# Patient Record
Sex: Female | Born: 1935 | Race: White | Hispanic: No | Marital: Single | Smoking: Former smoker
Health system: Southern US, Community
[De-identification: ages and names within clinical notes are randomized; demographics above are authoritative.]

## PROBLEM LIST (undated history)

## (undated) DIAGNOSIS — M159 Polyosteoarthritis, unspecified: Secondary | ICD-10-CM

## (undated) DIAGNOSIS — J45909 Unspecified asthma, uncomplicated: Secondary | ICD-10-CM

## (undated) DIAGNOSIS — J189 Pneumonia, unspecified organism: Secondary | ICD-10-CM

## (undated) DIAGNOSIS — IMO0001 Reserved for inherently not codable concepts without codable children: Secondary | ICD-10-CM

## (undated) DIAGNOSIS — N318 Other neuromuscular dysfunction of bladder: Secondary | ICD-10-CM

## (undated) DIAGNOSIS — R5381 Other malaise: Secondary | ICD-10-CM

## (undated) DIAGNOSIS — R5383 Other fatigue: Secondary | ICD-10-CM

## (undated) DIAGNOSIS — M545 Low back pain: Secondary | ICD-10-CM

## (undated) DIAGNOSIS — R091 Pleurisy: Secondary | ICD-10-CM

## (undated) DIAGNOSIS — I10 Essential (primary) hypertension: Secondary | ICD-10-CM

## (undated) DIAGNOSIS — E7800 Pure hypercholesterolemia, unspecified: Secondary | ICD-10-CM

## (undated) DIAGNOSIS — E785 Hyperlipidemia, unspecified: Secondary | ICD-10-CM

## (undated) DIAGNOSIS — R197 Diarrhea, unspecified: Secondary | ICD-10-CM

## (undated) DIAGNOSIS — E538 Deficiency of other specified B group vitamins: Secondary | ICD-10-CM

## (undated) DIAGNOSIS — M1990 Unspecified osteoarthritis, unspecified site: Secondary | ICD-10-CM

## (undated) DIAGNOSIS — K623 Rectal prolapse: Secondary | ICD-10-CM

## (undated) DIAGNOSIS — R252 Cramp and spasm: Secondary | ICD-10-CM

## (undated) DIAGNOSIS — E119 Type 2 diabetes mellitus without complications: Secondary | ICD-10-CM

## (undated) HISTORY — DX: Polyosteoarthritis, unspecified: M15.9

## (undated) HISTORY — DX: Type 2 diabetes mellitus without complications: E11.9

## (undated) HISTORY — DX: Other malaise: R53.81

## (undated) HISTORY — DX: Pneumonia, unspecified organism: J18.9

## (undated) HISTORY — DX: Other neuromuscular dysfunction of bladder: N31.8

## (undated) HISTORY — PX: BLADDER SURGERY: SHX569

## (undated) HISTORY — DX: Rectal prolapse: K62.3

## (undated) HISTORY — DX: Hyperlipidemia, unspecified: E78.5

## (undated) HISTORY — DX: Cramp and spasm: R25.2

## (undated) HISTORY — DX: Reserved for inherently not codable concepts without codable children: IMO0001

## (undated) HISTORY — DX: Diarrhea, unspecified: R19.7

## (undated) HISTORY — PX: ABDOMINAL HYSTERECTOMY: SHX81

## (undated) HISTORY — DX: Pure hypercholesterolemia, unspecified: E78.00

## (undated) HISTORY — DX: Deficiency of other specified B group vitamins: E53.8

## (undated) HISTORY — DX: Low back pain: M54.5

## (undated) HISTORY — DX: Unspecified osteoarthritis, unspecified site: M19.90

## (undated) HISTORY — DX: Essential (primary) hypertension: I10

## (undated) HISTORY — PX: APPENDECTOMY: SHX54

## (undated) HISTORY — DX: Other fatigue: R53.83

## (undated) HISTORY — PX: CHOLECYSTECTOMY: SHX55

## (undated) HISTORY — DX: Pleurisy: R09.1

---
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
## 1997-08-12 ENCOUNTER — Ambulatory Visit (HOSPITAL_COMMUNITY): Admission: RE | Admit: 1997-08-12 | Discharge: 1997-08-12 | Payer: Self-pay | Admitting: *Deleted

## 1998-08-14 ENCOUNTER — Other Ambulatory Visit: Admission: RE | Admit: 1998-08-14 | Discharge: 1998-08-14 | Payer: Self-pay | Admitting: *Deleted

## 1998-08-14 ENCOUNTER — Ambulatory Visit (HOSPITAL_COMMUNITY): Admission: RE | Admit: 1998-08-14 | Discharge: 1998-08-14 | Payer: Self-pay | Admitting: *Deleted

## 1998-08-14 ENCOUNTER — Encounter: Payer: Self-pay | Admitting: *Deleted

## 1999-09-16 ENCOUNTER — Encounter: Payer: Self-pay | Admitting: *Deleted

## 1999-09-16 ENCOUNTER — Other Ambulatory Visit: Admission: RE | Admit: 1999-09-16 | Discharge: 1999-09-16 | Payer: Self-pay | Admitting: *Deleted

## 1999-09-16 ENCOUNTER — Ambulatory Visit (HOSPITAL_COMMUNITY): Admission: RE | Admit: 1999-09-16 | Discharge: 1999-09-16 | Payer: Self-pay | Admitting: *Deleted

## 2000-09-19 ENCOUNTER — Ambulatory Visit (HOSPITAL_COMMUNITY): Admission: RE | Admit: 2000-09-19 | Discharge: 2000-09-19 | Payer: Self-pay | Admitting: *Deleted

## 2000-11-10 ENCOUNTER — Ambulatory Visit (HOSPITAL_COMMUNITY): Admission: RE | Admit: 2000-11-10 | Discharge: 2000-11-10 | Payer: Self-pay | Admitting: *Deleted

## 2001-09-04 ENCOUNTER — Encounter: Payer: Self-pay | Admitting: Gastroenterology

## 2001-09-20 ENCOUNTER — Ambulatory Visit (HOSPITAL_COMMUNITY): Admission: RE | Admit: 2001-09-20 | Discharge: 2001-09-20 | Payer: Self-pay | Admitting: *Deleted

## 2002-04-24 ENCOUNTER — Encounter: Admission: RE | Admit: 2002-04-24 | Discharge: 2002-04-24 | Payer: Self-pay | Admitting: *Deleted

## 2002-08-21 ENCOUNTER — Emergency Department (HOSPITAL_COMMUNITY): Admission: EM | Admit: 2002-08-21 | Discharge: 2002-08-21 | Payer: Self-pay | Admitting: Emergency Medicine

## 2002-08-21 ENCOUNTER — Encounter: Payer: Self-pay | Admitting: Emergency Medicine

## 2002-09-23 ENCOUNTER — Ambulatory Visit (HOSPITAL_COMMUNITY): Admission: RE | Admit: 2002-09-23 | Discharge: 2002-09-23 | Payer: Self-pay | Admitting: *Deleted

## 2003-09-24 ENCOUNTER — Ambulatory Visit (HOSPITAL_COMMUNITY): Admission: RE | Admit: 2003-09-24 | Discharge: 2003-09-24 | Payer: Self-pay | Admitting: Internal Medicine

## 2004-04-21 ENCOUNTER — Ambulatory Visit: Payer: Self-pay | Admitting: Internal Medicine

## 2004-05-26 ENCOUNTER — Ambulatory Visit: Payer: Self-pay | Admitting: Internal Medicine

## 2004-06-11 ENCOUNTER — Ambulatory Visit: Payer: Self-pay | Admitting: Internal Medicine

## 2004-07-14 ENCOUNTER — Ambulatory Visit: Payer: Self-pay | Admitting: Internal Medicine

## 2004-09-24 ENCOUNTER — Ambulatory Visit (HOSPITAL_COMMUNITY): Admission: RE | Admit: 2004-09-24 | Discharge: 2004-09-24 | Payer: Self-pay | Admitting: Internal Medicine

## 2004-10-14 ENCOUNTER — Ambulatory Visit: Payer: Self-pay | Admitting: Internal Medicine

## 2005-01-13 ENCOUNTER — Ambulatory Visit: Payer: Self-pay | Admitting: Internal Medicine

## 2005-05-09 ENCOUNTER — Ambulatory Visit: Payer: Self-pay | Admitting: Internal Medicine

## 2005-05-16 ENCOUNTER — Ambulatory Visit: Payer: Self-pay | Admitting: Internal Medicine

## 2005-09-13 ENCOUNTER — Ambulatory Visit: Payer: Self-pay | Admitting: Internal Medicine

## 2005-09-27 ENCOUNTER — Ambulatory Visit (HOSPITAL_COMMUNITY): Admission: RE | Admit: 2005-09-27 | Discharge: 2005-09-27 | Payer: Self-pay | Admitting: Internal Medicine

## 2005-11-15 ENCOUNTER — Ambulatory Visit: Payer: Self-pay | Admitting: Internal Medicine

## 2005-12-26 ENCOUNTER — Ambulatory Visit: Payer: Self-pay | Admitting: Internal Medicine

## 2006-04-04 ENCOUNTER — Ambulatory Visit: Payer: Self-pay | Admitting: Internal Medicine

## 2006-05-28 ENCOUNTER — Emergency Department (HOSPITAL_COMMUNITY): Admission: EM | Admit: 2006-05-28 | Discharge: 2006-05-28 | Payer: Self-pay | Admitting: Emergency Medicine

## 2006-05-31 ENCOUNTER — Ambulatory Visit: Payer: Self-pay | Admitting: Internal Medicine

## 2006-08-08 ENCOUNTER — Ambulatory Visit: Payer: Self-pay | Admitting: Internal Medicine

## 2006-09-29 ENCOUNTER — Ambulatory Visit (HOSPITAL_COMMUNITY): Admission: RE | Admit: 2006-09-29 | Discharge: 2006-09-29 | Payer: Self-pay | Admitting: Internal Medicine

## 2006-09-29 ENCOUNTER — Encounter: Payer: Self-pay | Admitting: Internal Medicine

## 2006-11-06 ENCOUNTER — Ambulatory Visit: Payer: Self-pay | Admitting: Internal Medicine

## 2006-11-07 LAB — CONVERTED CEMR LAB
Alkaline Phosphatase: 39 units/L (ref 39–117)
BUN: 20 mg/dL (ref 6–23)
Basophils Relative: 0.8 % (ref 0.0–1.0)
Bilirubin, Direct: 0.1 mg/dL (ref 0.0–0.3)
CO2: 32 meq/L (ref 19–32)
Creatinine, Ser: 1.1 mg/dL (ref 0.4–1.2)
Direct LDL: 97.6 mg/dL
Eosinophils Relative: 3 % (ref 0.0–5.0)
GFR calc Af Amer: 62 mL/min
Glucose, Bld: 118 mg/dL — ABNORMAL HIGH (ref 70–99)
HCT: 36.5 % (ref 36.0–46.0)
Hemoglobin: 12.6 g/dL (ref 12.0–15.0)
Lymphocytes Relative: 38.4 % (ref 12.0–46.0)
Monocytes Absolute: 0.4 10*3/uL (ref 0.2–0.7)
Neutro Abs: 3.5 10*3/uL (ref 1.4–7.7)
Neutrophils Relative %: 51.6 % (ref 43.0–77.0)
Potassium: 3.9 meq/L (ref 3.5–5.1)
RDW: 13.1 % (ref 11.5–14.6)
Sodium: 142 meq/L (ref 135–145)
Total Bilirubin: 0.7 mg/dL (ref 0.3–1.2)
Total Protein: 7.3 g/dL (ref 6.0–8.3)

## 2007-02-01 ENCOUNTER — Encounter (INDEPENDENT_AMBULATORY_CARE_PROVIDER_SITE_OTHER): Payer: Self-pay

## 2007-02-01 ENCOUNTER — Ambulatory Visit: Payer: Self-pay | Admitting: Internal Medicine

## 2007-02-01 DIAGNOSIS — E7800 Pure hypercholesterolemia, unspecified: Secondary | ICD-10-CM | POA: Insufficient documentation

## 2007-02-01 DIAGNOSIS — I10 Essential (primary) hypertension: Secondary | ICD-10-CM | POA: Insufficient documentation

## 2007-02-01 DIAGNOSIS — E119 Type 2 diabetes mellitus without complications: Secondary | ICD-10-CM

## 2007-02-01 DIAGNOSIS — M159 Polyosteoarthritis, unspecified: Secondary | ICD-10-CM

## 2007-02-01 DIAGNOSIS — M549 Dorsalgia, unspecified: Secondary | ICD-10-CM | POA: Insufficient documentation

## 2007-02-01 DIAGNOSIS — M1990 Unspecified osteoarthritis, unspecified site: Secondary | ICD-10-CM | POA: Insufficient documentation

## 2007-02-01 HISTORY — DX: Essential (primary) hypertension: I10

## 2007-02-01 HISTORY — DX: Polyosteoarthritis, unspecified: M15.9

## 2007-02-01 HISTORY — DX: Pure hypercholesterolemia, unspecified: E78.00

## 2007-02-01 HISTORY — DX: Type 2 diabetes mellitus without complications: E11.9

## 2007-02-13 ENCOUNTER — Telehealth: Payer: Self-pay | Admitting: Internal Medicine

## 2007-02-22 ENCOUNTER — Ambulatory Visit: Payer: Self-pay | Admitting: Internal Medicine

## 2007-05-09 ENCOUNTER — Ambulatory Visit: Payer: Self-pay | Admitting: Internal Medicine

## 2007-05-25 ENCOUNTER — Ambulatory Visit: Payer: Self-pay | Admitting: Internal Medicine

## 2007-08-14 ENCOUNTER — Encounter: Payer: Self-pay | Admitting: Internal Medicine

## 2007-08-17 ENCOUNTER — Telehealth: Payer: Self-pay | Admitting: Family Medicine

## 2007-08-17 ENCOUNTER — Ambulatory Visit: Payer: Self-pay | Admitting: Family Medicine

## 2007-08-17 DIAGNOSIS — N3000 Acute cystitis without hematuria: Secondary | ICD-10-CM | POA: Insufficient documentation

## 2007-08-17 LAB — CONVERTED CEMR LAB
Nitrite: POSITIVE
Urobilinogen, UA: 1

## 2007-09-20 ENCOUNTER — Ambulatory Visit: Payer: Self-pay | Admitting: Internal Medicine

## 2007-09-20 DIAGNOSIS — K623 Rectal prolapse: Secondary | ICD-10-CM

## 2007-09-20 HISTORY — DX: Rectal prolapse: K62.3

## 2007-10-02 ENCOUNTER — Ambulatory Visit (HOSPITAL_COMMUNITY): Admission: RE | Admit: 2007-10-02 | Discharge: 2007-10-02 | Payer: Self-pay | Admitting: Internal Medicine

## 2007-12-20 ENCOUNTER — Ambulatory Visit: Payer: Self-pay | Admitting: Internal Medicine

## 2007-12-20 DIAGNOSIS — E785 Hyperlipidemia, unspecified: Secondary | ICD-10-CM

## 2007-12-20 HISTORY — DX: Hyperlipidemia, unspecified: E78.5

## 2007-12-20 LAB — CONVERTED CEMR LAB: Hgb A1c MFr Bld: 8 % — ABNORMAL HIGH (ref 4.6–6.0)

## 2008-03-20 ENCOUNTER — Ambulatory Visit: Payer: Self-pay | Admitting: Internal Medicine

## 2008-05-22 ENCOUNTER — Ambulatory Visit: Payer: Self-pay | Admitting: Internal Medicine

## 2008-06-18 ENCOUNTER — Ambulatory Visit: Payer: Self-pay | Admitting: Family Medicine

## 2008-06-18 DIAGNOSIS — R091 Pleurisy: Secondary | ICD-10-CM | POA: Insufficient documentation

## 2008-06-18 DIAGNOSIS — J029 Acute pharyngitis, unspecified: Secondary | ICD-10-CM | POA: Insufficient documentation

## 2008-06-18 DIAGNOSIS — J189 Pneumonia, unspecified organism: Secondary | ICD-10-CM | POA: Insufficient documentation

## 2008-06-18 HISTORY — DX: Pneumonia, unspecified organism: J18.9

## 2008-06-18 HISTORY — DX: Pleurisy: R09.1

## 2008-06-19 LAB — CONVERTED CEMR LAB
Eosinophils Relative: 5.9 % — ABNORMAL HIGH (ref 0.0–5.0)
HCT: 33.2 % — ABNORMAL LOW (ref 36.0–46.0)
Hemoglobin: 11.5 g/dL — ABNORMAL LOW (ref 12.0–15.0)
MCV: 95.1 fL (ref 78.0–100.0)
Monocytes Absolute: 0.6 10*3/uL (ref 0.1–1.0)
Monocytes Relative: 7.1 % (ref 3.0–12.0)
Neutro Abs: 4.2 10*3/uL (ref 1.4–7.7)
RDW: 11.9 % (ref 11.5–14.6)
WBC: 8 10*3/uL (ref 4.5–10.5)

## 2008-07-21 ENCOUNTER — Ambulatory Visit: Payer: Self-pay | Admitting: Internal Medicine

## 2008-07-21 DIAGNOSIS — J069 Acute upper respiratory infection, unspecified: Secondary | ICD-10-CM | POA: Insufficient documentation

## 2008-08-15 ENCOUNTER — Encounter: Payer: Self-pay | Admitting: Internal Medicine

## 2008-10-20 ENCOUNTER — Ambulatory Visit: Payer: Self-pay | Admitting: Internal Medicine

## 2008-10-20 ENCOUNTER — Ambulatory Visit (HOSPITAL_COMMUNITY): Admission: RE | Admit: 2008-10-20 | Discharge: 2008-10-20 | Payer: Self-pay | Admitting: Internal Medicine

## 2008-10-20 DIAGNOSIS — R5383 Other fatigue: Secondary | ICD-10-CM | POA: Insufficient documentation

## 2008-10-20 DIAGNOSIS — R5381 Other malaise: Secondary | ICD-10-CM

## 2008-10-20 HISTORY — DX: Other malaise: R53.81

## 2008-10-20 LAB — CONVERTED CEMR LAB
AST: 49 units/L — ABNORMAL HIGH (ref 0–37)
Alkaline Phosphatase: 34 units/L — ABNORMAL LOW (ref 39–117)
BUN: 20 mg/dL (ref 6–23)
Basophils Absolute: 0 10*3/uL (ref 0.0–0.1)
Calcium: 9.7 mg/dL (ref 8.4–10.5)
GFR calc non Af Amer: 46.26 mL/min (ref >60)
Glucose, Bld: 74 mg/dL (ref 70–99)
Lymphocytes Relative: 36.4 % (ref 12.0–46.0)
Monocytes Relative: 2.5 % — ABNORMAL LOW (ref 3.0–12.0)
Neutrophils Relative %: 56.1 % (ref 43.0–77.0)
Platelets: 196 10*3/uL (ref 150.0–400.0)
RDW: 13.5 % (ref 11.5–14.6)
TSH: 1.51 microintl units/mL (ref 0.35–5.50)
Total Bilirubin: 0.5 mg/dL (ref 0.3–1.2)

## 2009-01-19 ENCOUNTER — Ambulatory Visit: Payer: Self-pay | Admitting: Internal Medicine

## 2009-03-12 ENCOUNTER — Telehealth: Payer: Self-pay | Admitting: Internal Medicine

## 2009-04-20 ENCOUNTER — Ambulatory Visit: Payer: Self-pay | Admitting: Internal Medicine

## 2009-04-20 LAB — CONVERTED CEMR LAB
Basophils Relative: 1 % (ref 0.0–3.0)
Eosinophils Relative: 3.9 % (ref 0.0–5.0)
MCV: 95.6 fL (ref 78.0–100.0)
Monocytes Absolute: 0.5 10*3/uL (ref 0.1–1.0)
Neutrophils Relative %: 56 % (ref 43.0–77.0)
RBC: 3.57 M/uL — ABNORMAL LOW (ref 3.87–5.11)
WBC: 7.2 10*3/uL (ref 4.5–10.5)

## 2009-08-20 ENCOUNTER — Ambulatory Visit: Payer: Self-pay | Admitting: Family Medicine

## 2009-08-20 DIAGNOSIS — R197 Diarrhea, unspecified: Secondary | ICD-10-CM

## 2009-08-20 DIAGNOSIS — N318 Other neuromuscular dysfunction of bladder: Secondary | ICD-10-CM | POA: Insufficient documentation

## 2009-08-20 HISTORY — DX: Other neuromuscular dysfunction of bladder: N31.8

## 2009-08-20 HISTORY — DX: Diarrhea, unspecified: R19.7

## 2009-10-19 ENCOUNTER — Telehealth: Payer: Self-pay | Admitting: Internal Medicine

## 2009-10-27 ENCOUNTER — Ambulatory Visit: Payer: Self-pay | Admitting: Internal Medicine

## 2009-10-27 DIAGNOSIS — N390 Urinary tract infection, site not specified: Secondary | ICD-10-CM | POA: Insufficient documentation

## 2010-01-13 ENCOUNTER — Ambulatory Visit: Payer: Self-pay | Admitting: Internal Medicine

## 2010-01-13 DIAGNOSIS — R300 Dysuria: Secondary | ICD-10-CM | POA: Insufficient documentation

## 2010-01-13 DIAGNOSIS — M545 Low back pain, unspecified: Secondary | ICD-10-CM

## 2010-01-13 DIAGNOSIS — M1990 Unspecified osteoarthritis, unspecified site: Secondary | ICD-10-CM | POA: Insufficient documentation

## 2010-01-13 HISTORY — DX: Unspecified osteoarthritis, unspecified site: M19.90

## 2010-01-13 HISTORY — DX: Low back pain, unspecified: M54.50

## 2010-01-13 LAB — CONVERTED CEMR LAB
Bilirubin Urine: NEGATIVE
Glucose, Urine, Semiquant: NEGATIVE
Hgb A1c MFr Bld: 7.5 % — ABNORMAL HIGH (ref 4.6–6.5)
Ketones, urine, test strip: NEGATIVE
Protein, U semiquant: 100
WBC Urine, dipstick: NEGATIVE
pH: 5

## 2010-01-20 ENCOUNTER — Ambulatory Visit: Payer: Self-pay | Admitting: Family Medicine

## 2010-01-20 ENCOUNTER — Telehealth: Payer: Self-pay | Admitting: Internal Medicine

## 2010-01-20 DIAGNOSIS — IMO0001 Reserved for inherently not codable concepts without codable children: Secondary | ICD-10-CM

## 2010-01-20 DIAGNOSIS — R071 Chest pain on breathing: Secondary | ICD-10-CM | POA: Insufficient documentation

## 2010-01-20 HISTORY — DX: Reserved for inherently not codable concepts without codable children: IMO0001

## 2010-02-05 ENCOUNTER — Ambulatory Visit: Payer: Self-pay | Admitting: Internal Medicine

## 2010-02-05 DIAGNOSIS — M353 Polymyalgia rheumatica: Secondary | ICD-10-CM | POA: Insufficient documentation

## 2010-02-10 ENCOUNTER — Telehealth: Payer: Self-pay | Admitting: Internal Medicine

## 2010-03-12 ENCOUNTER — Ambulatory Visit: Payer: Self-pay | Admitting: Internal Medicine

## 2010-03-12 DIAGNOSIS — R1013 Epigastric pain: Secondary | ICD-10-CM | POA: Insufficient documentation

## 2010-03-12 LAB — CONVERTED CEMR LAB
Bilirubin Urine: NEGATIVE
Glucose, Urine, Semiquant: NEGATIVE
Ketones, urine, test strip: NEGATIVE
Specific Gravity, Urine: 1.025
pH: 5

## 2010-03-16 ENCOUNTER — Encounter: Payer: Self-pay | Admitting: Internal Medicine

## 2010-03-16 LAB — CONVERTED CEMR LAB
ALT: 27 units/L (ref 0–35)
AST: 31 units/L (ref 0–37)
Alkaline Phosphatase: 40 units/L (ref 39–117)
Calcium: 9.5 mg/dL (ref 8.4–10.5)
Eosinophils Relative: 1.7 % (ref 0.0–5.0)
GFR calc non Af Amer: 34.3 mL/min (ref >60)
HCT: 35.7 % — ABNORMAL LOW (ref 36.0–46.0)
Hemoglobin: 12.2 g/dL (ref 12.0–15.0)
Lymphocytes Relative: 20.5 % (ref 12.0–46.0)
Lymphs Abs: 1.9 10*3/uL (ref 0.7–4.0)
Monocytes Relative: 4.2 % (ref 3.0–12.0)
Neutro Abs: 6.8 10*3/uL (ref 1.4–7.7)
Potassium: 5.1 meq/L (ref 3.5–5.1)
Sodium: 139 meq/L (ref 135–145)
TSH: 0.84 microintl units/mL (ref 0.35–5.50)
Total Bilirubin: 0.6 mg/dL (ref 0.3–1.2)
WBC: 9.3 10*3/uL (ref 4.5–10.5)

## 2010-03-30 ENCOUNTER — Telehealth: Payer: Self-pay | Admitting: Internal Medicine

## 2010-04-06 ENCOUNTER — Ambulatory Visit: Payer: Self-pay | Admitting: Internal Medicine

## 2010-04-06 DIAGNOSIS — R252 Cramp and spasm: Secondary | ICD-10-CM

## 2010-04-06 HISTORY — DX: Cramp and spasm: R25.2

## 2010-04-09 ENCOUNTER — Ambulatory Visit: Payer: Self-pay | Admitting: Cardiology

## 2010-04-12 ENCOUNTER — Telehealth: Payer: Self-pay | Admitting: Internal Medicine

## 2010-04-13 ENCOUNTER — Ambulatory Visit: Payer: Self-pay | Admitting: Internal Medicine

## 2010-04-23 ENCOUNTER — Telehealth: Payer: Self-pay | Admitting: Internal Medicine

## 2010-04-26 ENCOUNTER — Encounter: Payer: Self-pay | Admitting: Gastroenterology

## 2010-05-10 ENCOUNTER — Ambulatory Visit: Payer: Self-pay | Admitting: Internal Medicine

## 2010-05-12 LAB — CONVERTED CEMR LAB
CO2: 27 meq/L (ref 19–32)
Chloride: 99 meq/L (ref 96–112)
Creatinine, Ser: 1.2 mg/dL (ref 0.4–1.2)
Potassium: 5.9 meq/L — ABNORMAL HIGH (ref 3.5–5.1)
Sed Rate: 79 mm/hr — ABNORMAL HIGH (ref 0–22)

## 2010-05-17 ENCOUNTER — Encounter: Payer: Self-pay | Admitting: Internal Medicine

## 2010-05-17 ENCOUNTER — Encounter: Admission: RE | Admit: 2010-05-17 | Discharge: 2010-05-17 | Payer: Self-pay | Admitting: Rheumatology

## 2010-05-30 ENCOUNTER — Inpatient Hospital Stay (HOSPITAL_COMMUNITY)
Admission: EM | Admit: 2010-05-30 | Discharge: 2010-06-02 | Payer: Self-pay | Source: Home / Self Care | Admitting: Emergency Medicine

## 2010-05-30 ENCOUNTER — Encounter (INDEPENDENT_AMBULATORY_CARE_PROVIDER_SITE_OTHER): Payer: Self-pay | Admitting: *Deleted

## 2010-05-30 ENCOUNTER — Ambulatory Visit: Payer: Self-pay | Admitting: Internal Medicine

## 2010-05-31 ENCOUNTER — Encounter (INDEPENDENT_AMBULATORY_CARE_PROVIDER_SITE_OTHER): Payer: Self-pay | Admitting: Internal Medicine

## 2010-05-31 ENCOUNTER — Encounter (INDEPENDENT_AMBULATORY_CARE_PROVIDER_SITE_OTHER): Payer: Self-pay | Admitting: *Deleted

## 2010-06-01 ENCOUNTER — Encounter: Payer: Self-pay | Admitting: Internal Medicine

## 2010-06-03 ENCOUNTER — Encounter (INDEPENDENT_AMBULATORY_CARE_PROVIDER_SITE_OTHER): Payer: Self-pay | Admitting: *Deleted

## 2010-06-04 ENCOUNTER — Telehealth: Payer: Self-pay | Admitting: Internal Medicine

## 2010-06-10 ENCOUNTER — Telehealth: Payer: Self-pay

## 2010-06-10 ENCOUNTER — Ambulatory Visit: Payer: Self-pay | Admitting: Internal Medicine

## 2010-06-14 ENCOUNTER — Telehealth: Payer: Self-pay

## 2010-06-18 ENCOUNTER — Encounter: Payer: Self-pay | Admitting: Internal Medicine

## 2010-07-04 HISTORY — PX: CATARACT EXTRACTION: SUR2

## 2010-07-12 ENCOUNTER — Ambulatory Visit
Admission: RE | Admit: 2010-07-12 | Discharge: 2010-07-12 | Payer: Self-pay | Source: Home / Self Care | Attending: Internal Medicine | Admitting: Internal Medicine

## 2010-07-12 LAB — CONVERTED CEMR LAB: Blood Glucose, Fingerstick: 368

## 2010-07-16 ENCOUNTER — Encounter: Payer: Self-pay | Admitting: Internal Medicine

## 2010-07-20 ENCOUNTER — Ambulatory Visit
Admission: RE | Admit: 2010-07-20 | Discharge: 2010-07-20 | Payer: Self-pay | Source: Home / Self Care | Attending: Internal Medicine | Admitting: Internal Medicine

## 2010-07-20 ENCOUNTER — Other Ambulatory Visit: Payer: Self-pay | Admitting: Internal Medicine

## 2010-07-20 DIAGNOSIS — E538 Deficiency of other specified B group vitamins: Secondary | ICD-10-CM

## 2010-07-20 HISTORY — DX: Deficiency of other specified B group vitamins: E53.8

## 2010-07-20 LAB — VITAMIN B12: Vitamin B-12: >1500 pg/mL — ABNORMAL HIGH (ref 211–911)

## 2010-07-25 ENCOUNTER — Encounter: Payer: Self-pay | Admitting: Internal Medicine

## 2010-07-26 ENCOUNTER — Ambulatory Visit
Admission: RE | Admit: 2010-07-26 | Discharge: 2010-07-26 | Payer: Self-pay | Source: Home / Self Care | Attending: Internal Medicine | Admitting: Internal Medicine

## 2010-08-02 ENCOUNTER — Ambulatory Visit
Admission: RE | Admit: 2010-08-02 | Discharge: 2010-08-02 | Payer: Self-pay | Source: Home / Self Care | Attending: Internal Medicine | Admitting: Internal Medicine

## 2010-08-02 DIAGNOSIS — L259 Unspecified contact dermatitis, unspecified cause: Secondary | ICD-10-CM | POA: Insufficient documentation

## 2010-08-05 NOTE — Progress Notes (Signed)
  Phone Note Call from Patient   Caller: Patient Call For: [name] Lor  MD Summary of Call: Pt called to ask for antibiotics to take out of town for a UTI, and offered a office visit to be treated, but refused. Initial call taken by: Deanna Artis CMA,  February 10, 2010 4:12 PM

## 2010-08-05 NOTE — Assessment & Plan Note (Signed)
Summary: follow up/cjr   Vital Signs:  Patient profile:   77 year old female Weight:      200 pounds Temp:     99.0 degrees F oral BP sitting:   150 / 90  (right arm) Cuff size:   regular  Vitals Entered By: Cay Schillings LPN (November  7, 624THL 3:08 PM) CC: c/o no energy ,tired all the time   fbs 158 Is Patient Diabetic? Yes Did you bring your meter with you today? No   CC:  c/o no energy  and tired all the time   fbs 158.  History of Present Illness: 77 year old patient who is seen today for follow-up of an elevated sedimentation rate, and possible polymyalgia rheumatica.  She initially presented with upper shoulder and all and pain that did respond nicely to prednisone.  She continues to have an elevated sedimentation rate, malaise, and anorexia.  She describes upper back, and a vague, diffuse upper abdominal and chest wall discomfort.  No lower abdominal pain.  Due to abdominal pain and elevated sed rate.  A CT of the abdomen and pelvis was performed and was normal.  She is scheduled for a GI evaluation next month.  There's been some modest weight gain since her last visit.  Her chief complaint is fatigue and lack of energy  Allergies (verified): No Known Drug Allergies  Past History:  Past Medical History: Reviewed history from 04/06/2010 and no changes required. Hypertension DM Allergies Hyperlipidemia Low back pain Osteoarthritis suspected polymyalgia rheumatica abdominal pain  Past Surgical History: Reviewed history from 02/01/2007 and no changes required. Hysterectomy 2003 Bladder tack 2003  Family History: Reviewed history from 05/25/2007 and no changes required. father died age 76, history of COPD, and asthma mother died age 11 from hip fracture resulting in a pulmonary embolism  A brothers 3 sisters posture pneumonia, heart failure, on artery disease, diabetes, also, breast cancer, diabetes, peripheral vascular occlusive disease  Review of  Systems       The patient complains of anorexia, chest pain, and abdominal pain.  The patient denies fever, weight loss, weight gain, vision loss, decreased hearing, hoarseness, syncope, dyspnea on exertion, peripheral edema, prolonged cough, headaches, hemoptysis, melena, hematochezia, severe indigestion/heartburn, hematuria, incontinence, genital sores, muscle weakness, suspicious skin lesions, transient blindness, difficulty walking, depression, unusual weight change, abnormal bleeding, enlarged lymph nodes, angioedema, and breast masses.    Physical Exam  General:  overweight-appearing.  no apparent distress.  Blood pressure 140/85overweight-appearing.   Head:  Normocephalic and atraumatic without obvious abnormalities. No apparent alopecia or balding. Eyes:  No corneal or conjunctival inflammation noted. EOMI. Perrla. Funduscopic exam benign, without hemorrhages, exudates or papilledema. Vision grossly normal. Mouth:  Oral mucosa and oropharynx without lesions or exudates.  Teeth in good repair. Neck:  No deformities, masses, or tenderness noted. Lungs:  Normal respiratory effort, chest expands symmetrically. Lungs are clear to auscultation, no crackles or wheezes. Heart:  Normal rate and regular rhythm. S1 and S2 normal without gallop, murmur, click, rub or other extra sounds. Abdomen:  Bowel sounds positive,abdomen soft and non-tender without masses, organomegaly or hernias noted. Msk:  No deformity or scoliosis noted of thoracic or lumbar spine.   Extremities:  no edema Neurologic:  is able to stand from a sitting position, and transferred to the examining table without difficulty or evidence of  obvious proximal muscle weakness   Impression & Recommendations:  Problem # 1:  ABDOMINAL PAIN (ICD-789.00)  Problem # 2:  POLYMYALGIA RHEUMATICA (ICD-725)  Orders: Venipuncture [id number]) TLB-Sedimentation Rate (ESR) (85652-ESR)  Problem # 3:  DM (ICD-250.00)  Her updated medication list  for this problem includes:    Amaryl 4 Mg Tabs (Glimepiride) .Marland Kitchen... Take 1 tablet by mouth two times a day    Aspirin 81 Mg Tbec (Aspirin) .Marland Kitchen... Take 1 tablet by mouth once a day    Januvia 100 Mg Tabs (Sitagliptin phosphate) ..... One daily    Actos 45 Mg Tabs (Pioglitazone hcl) ..... One daily    Her updated medication list for this problem includes:    Amaryl 4 Mg Tabs (Glimepiride) .Marland Kitchen... Take 1 tablet by mouth two times a day    Aspirin 81 Mg Tbec (Aspirin) .Marland Kitchen... Take 1 tablet by mouth once a day    Januvia 100 Mg Tabs (Sitagliptin phosphate) ..... One daily    Actos 45 Mg Tabs (Pioglitazone hcl) ..... One daily  Orders: TLB-BMP (Basic Metabolic Panel-BMET) (99991111) Specimen Handling (99000)  Complete Medication List: 1)  Daily Multiple Vitamins Tabs (Multiple vitamin) .... Take 1 tablet by mouth once a day 2)  Oscal 500/200 D-3 500-200 Mg-unit Tabs (Calcium-vitamin d) .... One once daily 3)  Zocor 40 Mg Tabs (Simvastatin) .... 1/2 tab once daily 4)  Amaryl 4 Mg Tabs (Glimepiride) .... Take 1 tablet by mouth two times a day 5)  Aspirin 81 Mg Tbec (Aspirin) .... Take 1 tablet by mouth once a day 6)  Hydrochlorothiazide 12.5 Mg Tabs (Hydrochlorothiazide) .... Take 1 tablet by mouth once a day 7)  Flonase 50 Mcg/act Susp (Fluticasone propionate) .... As needed 8)  Albuterol 90 Mcg/act Aers (Albuterol) .... As needed 9)  Mobic 7.5 Mg Tabs (Meloxicam) .Marland Kitchen.. 1 by mouth two times a day 10)  Accu-chek Aviva Kit (Blood glucose monitoring suppl) .... Test bs qid 11)  Hydrocodone-acetaminophen 5-500 Mg Tabs (Hydrocodone-acetaminophen) .Marland Kitchen.. 1 -2 q4h as needed pain 12)  Cyclobenzaprine Hcl 5 Mg Tabs (Cyclobenzaprine hcl) .... One twice daily as needed for back pain 13)  Januvia 100 Mg Tabs (Sitagliptin phosphate) .... One daily 14)  Actos 45 Mg Tabs (Pioglitazone hcl) .... One daily 15)  Prednisone 5 Mg Tabs (Prednisone) .... 2 by mouth qam and 1 by mouth qhs 16)  Zolpidem Tartrate 10  Mg Tabs (Zolpidem tartrate) .... One at bedtime  Patient Instructions: 1)  Please schedule a follow-up appointment in 2 months. 2)  Limit your Sodium (Salt). 3)  It is important that you exercise regularly at least 20 minutes 5 times a week. If you develop chest pain, have severe difficulty breathing, or feel very tired , stop exercising immediately and seek medical attention. 4)  You need to lose weight. Consider a lower calorie diet and regular exercise.  5)  Check your blood sugars regularly. If your readings are usually above : or below 70 you should contact our office. 6)  It is important that your Diabetic A1c level is checked every 3 months. Prescriptions: ZOLPIDEM TARTRATE 10 MG TABS (ZOLPIDEM TARTRATE) one at bedtime  #30 x 3   Entered and Authorized by:   Marletta Lor  MD   Signed by:   Marletta Lor  MD on 05/10/2010   Method used:   Print then Give to Patient   RxID:   [id number] PREDNISONE 5 MG TABS (PREDNISONE) 2 by mouth qam and 1 by mouth qhs  #180 x 0   Entered and Authorized by:   Marletta Lor  MD   Signed by:   Doretha Sou  Burnice Logan  MD on 05/10/2010   Method used:   Print then Give to Patient   RxID:   [id number] ACTOS 45 MG TABS (PIOGLITAZONE HCL) one daily  #90 x 0   Entered and Authorized by:   Marletta Lor  MD   Signed by:   Marletta Lor  MD on 05/10/2010   Method used:   Print then Give to Patient   RxID:   [id number] JANUVIA 100 MG TABS (SITAGLIPTIN PHOSPHATE) one daily  #90 x 0   Entered and Authorized by:   Marletta Lor  MD   Signed by:   Marletta Lor  MD on 05/10/2010   Method used:   Print then Give to Patient   RxID:   [id number] HYDROCODONE-ACETAMINOPHEN 5-500 MG TABS (HYDROCODONE-ACETAMINOPHEN) 1 -2 q4h as needed pain  #90 x 3   Entered and Authorized by:   Marletta Lor  MD   Signed by:   Marletta Lor  MD on 05/10/2010   Method used:   Print then Give to Patient    RxID:   [id number] ACCU-CHEK AVIVA   KIT (BLOOD GLUCOSE MONITORING SUPPL) test BS qid  #100 x 12   Entered and Authorized by:   Marletta Lor  MD   Signed by:   Marletta Lor  MD on 05/10/2010   Method used:   Print then Give to Patient   RxID:   [id number] 50 MCG/ACT  SUSP (FLUTICASONE PROPIONATE) as needed  #3 x 6   Entered and Authorized by:   Marletta Lor  MD   Signed by:   Marletta Lor  MD on 05/10/2010   Method used:   Print then Give to Patient   RxID:   [id number] HYDROCHLOROTHIAZIDE 12.5 MG  TABS (HYDROCHLOROTHIAZIDE) Take 1 tablet by mouth once a day  #90 Each x 3   Entered and Authorized by:   Marletta Lor  MD   Signed by:   Marletta Lor  MD on 05/10/2010   Method used:   Print then Give to Patient   RxID:   [id number] AMARYL 4 MG  TABS (GLIMEPIRIDE) Take 1 tablet by mouth two times a day  #180 x 6   Entered and Authorized by:   Marletta Lor  MD   Signed by:   Marletta Lor  MD on 05/10/2010   Method used:   Print then Give to Patient   RxID:   [id number] ZOCOR 40 MG  TABS (SIMVASTATIN) 1/2 tab once daily  #90 x 6   Entered and Authorized by:   Marletta Lor  MD   Signed by:   Marletta Lor  MD on 05/10/2010   Method used:   Print then Give to Patient   RxID:   [id number]    Orders Added: 1)  Est. Patient Level IV [id number] 2)  Venipuncture [id number] 3)  TLB-Sedimentation Rate (ESR) [85652-ESR] 4)  TLB-BMP (Basic Metabolic Panel-BMET) 123456 5)  Specimen Handling [99000]

## 2010-08-05 NOTE — Progress Notes (Signed)
Summary: refill hydrocodone- acteaminophen  Phone Note Refill Request Call back at Home Phone [phone] Message from:  Patient---live call  Refills Requested: Medication #1:  HYDROCODONE-ACETAMINOPHEN 5-500 MG TABS 1 -2 q4h as needed pain [facility] on [address]  Initial call taken by: Despina Arias,  October 19, 2009 8:26 AM    Prescriptions: HYDROCODONE-ACETAMINOPHEN 5-500 MG TABS (HYDROCODONE-ACETAMINOPHEN) 1 -2 q4h as needed pain  #90 x 3   Entered by:   Cay Schillings LPN   Authorized by:   Marletta Lor  MD   Signed by:   Cay Schillings LPN on 624THL   Method used:   Historical   [id number]  OK  #90  RF 3  called into [facility]   KIK

## 2010-08-05 NOTE — Assessment & Plan Note (Signed)
Summary: 2 WK ROV/NJR   Vital Signs:  Patient profile:   77 year old female Weight:      190 pounds Temp:     98.0 degrees F oral BP sitting:   130 / 80  (right arm) Cuff size:   regular  Vitals Entered By: Cay Schillings LPN (January 23, [id number] 3:36 PM) CC: 2 wk rov - doing better    fbs 242 Is Patient Diabetic? Yes Did you bring your meter with you today? No   Primary Care Provider:  [name], MD  CC:  2 wk rov - doing better    fbs 242.  History of Present Illness: 77 -year-old patient who is seen today for follow-up of her type 2 diabetes.  She is done quite well.  Unfortunately, she misunderstood her directions and has been taking mealtime insulin before breakfast only, as well as basal insulin at bedtime.  She feels remarkably well.  She was resumed on Effexor last visit due to worsening depression.  She has a history of fatigue, myalgias, and an elevated sedimentation rate, but due to suspected polymyalgia rheumatica.  Prednisone therapy has been discontinued, and she remains clinically well.  A rheumatology consult and felt that she did not have PMR.  She feels better today than she has in months and she is off prednisone therapy.  Allergies: 1)  ! * Januvia 2)  ! * Metformin  Past History:  Past Medical History: Reviewed history from 07/15/2010 and no changes required. Hypertension DM Allergies Hyperlipidemia Low back pain Osteoarthritis suspected polymyalgia rheumatica abdominal pain/ history of gastritis, November 2011 B12 deficiency Diverticulosis Hemorrhoids  Review of Systems  The patient denies anorexia, fever, weight loss, weight gain, vision loss, decreased hearing, hoarseness, chest pain, syncope, dyspnea on exertion, peripheral edema, prolonged cough, headaches, hemoptysis, abdominal pain, melena, hematochezia, severe indigestion/heartburn, hematuria, incontinence, genital sores, muscle weakness, suspicious skin lesions, transient blindness,  difficulty walking, depression, unusual weight change, abnormal bleeding, enlarged lymph nodes, angioedema, and breast masses.    Physical Exam  General:  overweight-appearing.  normal blood pressure; no distress   Impression & Recommendations:  Problem # 1:  FATIGUE (ICD-780.79)  Problem # 2:  VITAMIN B12 DEFICIENCY (ICD-266.2)  Problem # 3:  DM (ICD-250.00)  Her updated medication list for this problem includes:    Aspirin 81 Mg Tbec (Aspirin) .Marland Kitchen... Take 1 tablet by mouth once a day    Lantus Solostar 100 Unit/ml Soln (Insulin glargine) .Marland KitchenMarland KitchenMarland KitchenMarland Kitchen 30 units at bedtime    Humalog Kwikpen 100 Unit/ml Soln (Insulin lispro (human)) .Marland Kitchen... 8 units prior to each meal; add 4 units if bs over 200  Complete Medication List: 1)  Daily Multiple Vitamins Tabs (Multiple vitamin) .... Take 1 tablet by mouth once a day 2)  Oscal 500/200 D-3 500-200 Mg-unit Tabs (Calcium-vitamin d) .... One once daily 3)  Zocor 40 Mg Tabs (Simvastatin) .... 1/2 tab once daily 4)  Aspirin 81 Mg Tbec (Aspirin) .... Take 1 tablet by mouth once a day 5)  Hydrochlorothiazide 12.5 Mg Tabs (Hydrochlorothiazide) .... Take 1 tablet by mouth once a day 6)  Flonase 50 Mcg/act Susp (Fluticasone propionate) .... As needed 7)  Albuterol 90 Mcg/act Aers (Albuterol) .... As needed 8)  Accu-chek Aviva Kit (Blood glucose monitoring suppl) .... Test bs qid 9)  Hydrocodone-acetaminophen 5-500 Mg Tabs (Hydrocodone-acetaminophen) .Marland Kitchen.. 1 -2 q4h as needed pain 10)  Zolpidem Tartrate 10 Mg Tabs (Zolpidem tartrate) .... One at bedtime 11)  Lantus Solostar 100 Unit/ml Soln (  Insulin glargine) .... 30 units at bedtime 12)  Vitamin B-12 1000 Mcg Tabs (Cyanocobalamin) .... Once daily x14days 13)  Bd Pen Needle Nano U/f 32g X 4 Mm Misc (Insulin pen needle) .... Once daily and prn 14)  Humalog Kwikpen 100 Unit/ml Soln (Insulin lispro (human)) .... 8 units prior to each meal; add 4 units if bs over 200 15)  Venlafaxine Hcl 75 Mg Xr24h-cap (Venlafaxine  hcl) .... One every am 16)  Effexor Xr 75 Mg Xr24h-cap (Venlafaxine hcl) .... Take 1 tablet by mouth once a day  Patient Instructions: 1)  Please schedule a follow-up appointment in 2 months. 2)  Limit your Sodium (Salt) to less than 2 grams a day(slightly less than 1/2 a teaspoon) to prevent fluid retention, swelling, or worsening of symptoms. 3)  It is important that you exercise regularly at least 20 minutes 5 times a week. If you develop chest pain, have severe difficulty breathing, or feel very tired , stop exercising immediately and seek medical attention. 4)  You need to lose weight. Consider a lower calorie diet and regular exercise.  5)  Check your blood sugars regularly. If your readings are usually above : or below 70 you should contact our office. 6)  It is important that your Diabetic A1c level is checked every 3 months.   Orders Added: 1)  Est. Patient Level III [id number]

## 2010-08-05 NOTE — Miscellaneous (Signed)
Summary: pen needle rx   Clinical Lists Changes  Medications: Added new medication of BD PEN NEEDLE NANO U/F 32G X 4 MM MISC (INSULIN PEN NEEDLE) once daily and prn - Signed Rx of BD PEN NEEDLE NANO U/F 32G X 4 MM MISC (INSULIN PEN NEEDLE) once daily and prn;  #90 day x 3;  Signed;  Entered by: Cay Schillings LPN;  Authorized by: Marletta Lor  MD;  Method used: Faxed to [facility] [address], [place], [place], [place]  [address], Ph: [phone], Fax: [phone]    Prescriptions: BD PEN NEEDLE NANO U/F 32G X 4 MM MISC (INSULIN PEN NEEDLE) once daily and prn  #90 day x 3   Entered by:   Cay Schillings LPN   Authorized by:   Marletta Lor  MD   Signed by:   Cay Schillings LPN on [id number]   Method used:   [facility]ed [facility] ...       [facility]       [address]       [place], [place]  [address]       Ph: [phone]       Fax: [phone]   RxID:   [id number]

## 2010-08-05 NOTE — Miscellaneous (Signed)
Summary: med change  Clinical Lists Changes  Medications: Changed medication from PREDNISONE 5 MG TABS (PREDNISONE) 1 1/2 tablet every AM to PREDNISONE 5 MG TABS (PREDNISONE) 2 by mouth qam and 1 by mouth qhs

## 2010-08-05 NOTE — Procedures (Signed)
Summary: Upper Endoscopy  Patient: [name] [name] Note: All result statuses are Final unless otherwise noted.  Tests: (1) Upper Endoscopy (EGD)   EGD Upper Endoscopy       DONE     [facility]     [place], [place]  [address]           ENDOSCOPY PROCEDURE REPORT           PATIENT:  [name], [name]  MR#:  [id number]     BIRTHDATE:  03/03/1929, 80 yrs. old  GENDER:  female           ENDOSCOPIST:  Gatha Mayer, MD, [facility]     Referred by:  [facility],           PROCEDURE DATE:  06/01/2010     PROCEDURE:  EGD with biopsy, [id number]     ASA CLASS:  Class III     INDICATIONS:  epigastric pain chronic upper abdominal pain     ESR elevated     other labs ok though B12 deficiency found     CT abd/pelvis negative           MEDICATIONS:   Fentanyl 50 mcg IV, Versed 4.5 mg     TOPICAL ANESTHETIC:  Cetacaine Spray           DESCRIPTION OF PROCEDURE:   After the risks benefits and     alternatives of the procedure were thoroughly explained, informed     consent was obtained.  The Pentax Gastroscope [id number] endoscope     was introduced through the mouth and advanced to the second     portion of the duodenum, without limitations.  The instrument was     slowly withdrawn as the mucosa was fully examined.     <<PROCEDUREIMAGES>>           Moderate gastritis was found in the total stomach. Erythema,     mosaic mucosal pattern seen. Multiple biopsies were obtained and     sent to pathology.  Otherwise the examination was normal.     Retroflexed views revealed Retroflexion exam demonstrated findings as     previously described.    The scope was then withdrawn from the     patient and the procedure completed.           COMPLICATIONS:  None           ENDOSCOPIC IMPRESSION:     1) Moderate gastritis in the total stomach     2) Otherwise normal examination     RECOMMENDATIONS:     1) Stop Januvia - outpatient chart review shows pain began after     this  (within 1-3 months). It is known to cause abdominal pain and     I have seen it do something similar in other patients.     2) Replete B12 (This may have been due to prior metformin use)     3) daily PPI for now but not necessarily chronic     4) I will follow-up pathology and notify           REPEAT EXAM:  In for as needed.           Gatha Mayer, MD, [facility]           CC:  Marletta Lor, MD     Hurley Cisco, MD     The Patient  n.     eSIGNED:   Gatha Mayer at 06/01/2010 10:57 AM           Jerolyn Shin, [id number]  Note: An exclamation mark (!) indicates a result that was not dispersed into the flowsheet. Document Creation Date: 06/01/2010 10:57 AM _______________________________________________________________________  (1) Order result status: Final Collection or observation date-time: 06/01/2010 10:46 Requested date-time:  Receipt date-time:  Reported date-time:  Referring Physician:   Ordering Physician: Silvano Rusk [phone]) Specimen Source:  Source: Tawanna Cooler Order Number: [id number] Lab site:   Appended Document: Upper Endoscopy   EGD  Procedure date:  06/01/2010  Findings:          1) Moderate gastritis in the total stomach     2) Otherwise normal examination     RECOMMENDATIONS:     1) Stop Januvia - outpatient chart review shows pain began after     this (within 1-3 months). It is known to cause abdominal pain and     I have seen it do something similar in other patients.     2) Replete B12 (This may have been due to prior metformin use)     3) daily PPI for now but not necessarily chronic     4) I will follow-up pathology and notify  1. Stomach, biopsy,  :  - GASTRIC BODY MUCOSA WITH ASSOCIATED MINIMAL CHRONIC INFLAMMATION, NO EVIDENCE OF HELICOBACTER PYLORI, INTESTINAL METAPLASIA, DYSPLASIA, OR MALIGNANCY.

## 2010-08-05 NOTE — Letter (Signed)
Summary: Rheumatology-Dr. Hurley Cisco  Rheumatology-Dr. Hurley Cisco   Imported By: Laural Benes 07/06/2010 08:58:50  _____________________________________________________________________  External Attachment:    Type:   Image     Comment:   External Document

## 2010-08-05 NOTE — Assessment & Plan Note (Signed)
Summary: 4 MNTH ROV//SLM   Vital Signs:  Patient profile:   77 year old female Weight:      203 pounds Temp:     99.0 degrees F oral BP sitting:   128 / 84  (right arm) Cuff size:   regular  Vitals Entered By: Cay Schillings LPN (February 17, 624THL 3:13 PM) CC: 4 mos rov - not feeling well - still with diarrhea on/off , not sleeping   [id number]    having cataract surg 09/11/09 Is Patient Diabetic? Yes   CC:  4 mos rov - not feeling well - still with diarrhea on/off  and not sleeping   [id number]    having cataract surg 09/11/09.  History of Present Illness: 77 year old patient history of type 2 diabetes dyslipidemia and hypertension.  She states that she has felt unwell since her last visit here.  She has frequent diarrhea and discomfort.  She has slept very poorly at night due to nocturia.  She complains of very little urgency and polyuria.  Throughout the day however.  She is scheduled for elective cataract surgery, March 3.  Blood sugars have remained stable. her last hemoglobin A1c7.1  Preventive Screening-Counseling & Management  Alcohol-Tobacco     Smoking Status: quit  Allergies: No Known Drug Allergies  Past History:  Family History: Last updated: 31-May-2007 father died age 31, history of COPD, and asthma mother died age 44 from hip fracture resulting in a pulmonary embolism  A brothers 3 sisters posture pneumonia, heart failure, on artery disease, diabetes, also, breast cancer, diabetes, peripheral vascular occlusive disease  Risk Factors: Smoking Status: quit (08/20/2009)  Past Medical History: Reviewed history from 12/20/2007 and no changes required. Hypertension DM Allergies Hyperlipidemia  Past Surgical History: Reviewed history from 02/01/2007 and no changes required. Hysterectomy 2003 Bladder tack 2003  Social History: Smoking Status:  quit  Review of Systems       The patient complains of anorexia and abdominal pain.  The patient denies fever,  weight loss, weight gain, vision loss, decreased hearing, hoarseness, chest pain, syncope, dyspnea on exertion, peripheral edema, prolonged cough, headaches, hemoptysis, melena, hematochezia, severe indigestion/heartburn, hematuria, incontinence, genital sores, muscle weakness, suspicious skin lesions, transient blindness, difficulty walking, depression, unusual weight change, abnormal bleeding, enlarged lymph nodes, angioedema, and breast masses.    Physical Exam  General:  overweight-appearing.  alert no distress.  Blood pressure 130/80 Head:  Normocephalic and atraumatic without obvious abnormalities. No apparent alopecia or balding. Eyes:  No corneal or conjunctival inflammation noted. EOMI. Perrla. Funduscopic exam benign, without hemorrhages, exudates or papilledema. Vision grossly normal. Mouth:  Oral mucosa and oropharynx without lesions or exudates.  Teeth in good repair. Neck:  No deformities, masses, or tenderness noted. Lungs:  Normal respiratory effort, chest expands symmetrically. Lungs are clear to auscultation, no crackles or wheezes. Heart:  Normal rate and regular rhythm. S1 and S2 normal without gallop, murmur, click, rub or other extra sounds. Abdomen:  Bowel sounds positive,abdomen soft and non-tender without masses, organomegaly or hernias noted. Msk:  No deformity or scoliosis noted of thoracic or lumbar spine.   Pulses:  R and L carotid,radial,femoral,dorsalis pedis and posterior tibial pulses are full and equal bilaterally Extremities:  No clubbing, cyanosis, edema, or deformity noted with normal full range of motion of all joints.     Impression & Recommendations:  Problem # 1:  FATIGUE (ICD-780.79)  Problem # 2:  DM (ICD-250.00)  The following medications were removed from the medication list:  Actoplus Met 15-850 Mg Tabs (Pioglitazone hcl-metformin hcl) ..... One twice daily Her updated medication list for this problem includes:    Amaryl 4 Mg Tabs  (Glimepiride) .Marland Kitchen... Take 1 tablet by mouth two times a day    Aspirin 81 Mg Tbec (Aspirin) .Marland Kitchen... Take 1 tablet by mouth once a day    Januvia 100 Mg Tabs (Sitagliptin phosphate) ..... One daily    Actos 45 Mg Tabs (Pioglitazone hcl) ..... One daily  The following medications were removed from the medication list:    Actoplus Met 15-850 Mg Tabs (Pioglitazone hcl-metformin hcl) ..... One twice daily Her updated medication list for this problem includes:    Amaryl 4 Mg Tabs (Glimepiride) .Marland Kitchen... Take 1 tablet by mouth two times a day    Aspirin 81 Mg Tbec (Aspirin) .Marland Kitchen... Take 1 tablet by mouth once a day    Januvia 100 Mg Tabs (Sitagliptin phosphate) ..... One daily    Actos 45 Mg Tabs (Pioglitazone hcl) ..... One daily  Problem # 3:  OVERACTIVE BLADDER (ICD-596.51) will give a try  of vesicare 5 mg daily   Problem # 4:  DIARRHEA, CHRONIC (ICD-787.91) this may be a side effect of metformin therapy; will discontinue and add Januvia   Complete Medication List: 1)  Daily Multiple Vitamins Tabs (Multiple vitamin) .... Take 1 tablet by mouth once a day 2)  Oscal 500/200 D-3 500-200 Mg-unit Tabs (Calcium-vitamin d) .... One once daily 3)  Zocor 40 Mg Tabs (Simvastatin) .... 1/2 tab once daily 4)  Amaryl 4 Mg Tabs (Glimepiride) .... Take 1 tablet by mouth two times a day 5)  Aspirin 81 Mg Tbec (Aspirin) .... Take 1 tablet by mouth once a day 6)  Hydrochlorothiazide 12.5 Mg Tabs (Hydrochlorothiazide) .... Take 1 tablet by mouth once a day 7)  Flonase 50 Mcg/act Susp (Fluticasone propionate) .... As needed 8)  Albuterol 90 Mcg/act Aers (Albuterol) .... As needed 9)  Mobic 7.5 Mg Tabs (Meloxicam) .Marland Kitchen.. 1 by mouth two times a day 10)  Accu-chek Aviva Kit (Blood glucose monitoring suppl) .... Test bs qid 11)  Hydrocodone-acetaminophen 5-500 Mg Tabs (Hydrocodone-acetaminophen) .Marland Kitchen.. 1 -2 q4h as needed pain 12)  Cyclobenzaprine Hcl 5 Mg Tabs (Cyclobenzaprine hcl) .... One twice daily as needed for back  pain 13)  Januvia 100 Mg Tabs (Sitagliptin phosphate) .... One daily 14)  Actos 45 Mg Tabs (Pioglitazone hcl) .... One daily  Patient Instructions: 1)  Please schedule a follow-up appointment in 2 months. 2)  Limit your Sodium (Salt). 3)  It is important that you exercise regularly at least 20 minutes 5 times a week. If you develop chest pain, have severe difficulty breathing, or feel very tired , stop exercising immediately and seek medical attention. 4)  You need to lose weight. Consider a lower calorie diet and regular exercise.  5)  Check your blood sugars regularly. If your readings are usually above : or below 70 you should contact our office. 6)  It is important that your Diabetic A1c level is checked every 3 months. Prescriptions: ACTOS 45 MG TABS (PIOGLITAZONE HCL) one daily  #90 x 0   Entered and Authorized by:   Marletta Lor  MD   Signed by:   Marletta Lor  MD on 08/20/2009   Method used:   Print then Give to Patient   RxID:   [id number] JANUVIA 100 MG TABS (SITAGLIPTIN PHOSPHATE) one daily  #90 x 0   Entered and Authorized by:  Marletta Lor  MD   Signed by:   Marletta Lor  MD on 08/20/2009   Method used:   Print then Give to Patient   RxID:   [id number] ACTOS 45 MG TABS (PIOGLITAZONE HCL) one daily  #90 x 0   Entered and Authorized by:   Marletta Lor  MD   Signed by:   Marletta Lor  MD on 08/20/2009   Method used:   Electronically to        [facility] (retail)       [address]       [place], [place]  [address]       Ph: [phone]       Fax: [phone]   RxID:   [id number] JANUVIA 100 MG TABS (SITAGLIPTIN PHOSPHATE) one daily  #90 x 0   Entered and Authorized by:   Marletta Lor  MD   Signed by:   Marletta Lor  MD on 08/20/2009   Method used:   Electronically to        [facility] (retail)       [address]       [place], [place]  [address]       Ph: [phone]       Fax: [phone]   RxID:   [id number]

## 2010-08-05 NOTE — Assessment & Plan Note (Signed)
Summary: K PT/CHEST PAINS CONTINUE/WONDERS IF SHE HAS WALKING PNEUMONI...   Vital Signs:  Patient profile:   77 year old female Weight:      202 pounds O2 Sat:      95 % on Room air Temp:     98.3 degrees F oral BP sitting:   130 / 68  (left arm) Cuff size:   large  Vitals Entered By: Nira Conn LPN (July 20, 624THL [id number] PM)  O2 Flow:  Room air  History of Present Illness: Patient seen with initial chief complaint of chest pains.  On further questioning and review of chart she's had some diffuse poorly localized pains involving her trunk initially predominantly arms and shoulders and back and now somewhat more generalized. She is somewhat of a poor historian. Recent sedimentation rate of 70. Started on prednisone and at 10 mg a day and saw some improvement. Reduced to 5 mg daily today and symptoms slightly worse. Occasional headaches. No visual disturbance.  Chest pains are somewhat intermittent. Achy quality. Moderate severity. No exacerbating features. Hydrocodone helps. Denies associated fever, chills, cough, pleuritic pain, hemoptysis, or GERD symptoms. Possibly mild dyspnea. Increased generalized fatigue recently.  Allergies (verified): No Known Drug Allergies  Past History:  Past Medical History: Last updated: 01/13/2010 Hypertension DM Allergies Hyperlipidemia Low back pain Osteoarthritis  Family History: Last updated: 2007-06-10 father died age 52, history of COPD, and asthma mother died age 5 from hip fracture resulting in a pulmonary embolism  A brothers 3 sisters posture pneumonia, heart failure, on artery disease, diabetes, also, breast cancer, diabetes, peripheral vascular occlusive disease PMH reviewed for relevance, FH reviewed for relevance  Review of Systems  The patient denies anorexia, fever, weight loss, syncope, dyspnea on exertion, peripheral edema, abdominal pain, melena, hematochezia, and severe indigestion/heartburn.    Physical  Exam  General:  Well-developed,well-nourished,in no acute distress; alert,appropriate and cooperative throughout examination Neck:  No deformities, masses, or tenderness noted. Chest Wall:  mild diffuse chest wall tenderness. Lungs:  Normal respiratory effort, chest expands symmetrically. Lungs are clear to auscultation, no crackles or wheezes. Heart:  normal rate and regular rhythm.   Extremities:  no edema   Impression & Recommendations:  Problem # 1:  MYALGIA (ICD-729.1) Symptom complex of fatigue, decreased appetite, diffuse trunk pain, and elevated sedimentation rate question polymyalgia rheumatica. Continue prednisone and titrate back to 15 mg daily for 3 days, then 10 mg daily for 3 days, then 5 mg daily. Patient to follow up with primary physician in a couple weeks Her updated medication list for this problem includes:    Aspirin 81 Mg Tbec (Aspirin) .Marland Kitchen... Take 1 tablet by mouth once a day    Mobic 7.5 Mg Tabs (Meloxicam) .Marland Kitchen... 1 by mouth two times a day    Hydrocodone-acetaminophen 5-500 Mg Tabs (Hydrocodone-acetaminophen) .Marland Kitchen... 1 -2 q4h as needed pain    Cyclobenzaprine Hcl 5 Mg Tabs (Cyclobenzaprine hcl) ..... One twice daily as needed for back pain  Problem # 2:  CHEST WALL PAIN, ACUTE (ICD-786.52)  ?sec to # 1.  EKG unremarkable Her updated medication list for this problem includes:    Aspirin 81 Mg Tbec (Aspirin) .Marland Kitchen... Take 1 tablet by mouth once a day    Mobic 7.5 Mg Tabs (Meloxicam) .Marland Kitchen... 1 by mouth two times a day  Orders: EKG w/ Interpretation (93000)  Complete Medication List: 1)  Daily Multiple Vitamins Tabs (Multiple vitamin) .... Take 1 tablet by mouth once a day 2)  Oscal 500/200 D-3  500-200 Mg-unit Tabs (Calcium-vitamin d) .... One once daily 3)  Zocor 40 Mg Tabs (Simvastatin) .... 1/2 tab once daily 4)  Amaryl 4 Mg Tabs (Glimepiride) .... Take 1 tablet by mouth two times a day 5)  Aspirin 81 Mg Tbec (Aspirin) .... Take 1 tablet by mouth once a day 6)   Hydrochlorothiazide 12.5 Mg Tabs (Hydrochlorothiazide) .... Take 1 tablet by mouth once a day 7)  Flonase 50 Mcg/act Susp (Fluticasone propionate) .... As needed 8)  Albuterol 90 Mcg/act Aers (Albuterol) .... As needed 9)  Mobic 7.5 Mg Tabs (Meloxicam) .Marland Kitchen.. 1 by mouth two times a day 10)  Accu-chek Aviva Kit (Blood glucose monitoring suppl) .... Test bs qid 11)  Hydrocodone-acetaminophen 5-500 Mg Tabs (Hydrocodone-acetaminophen) .Marland Kitchen.. 1 -2 q4h as needed pain 12)  Cyclobenzaprine Hcl 5 Mg Tabs (Cyclobenzaprine hcl) .... One twice daily as needed for back pain 13)  Januvia 100 Mg Tabs (Sitagliptin phosphate) .... One daily 14)  Actos 45 Mg Tabs (Pioglitazone hcl) .... One daily 15)  Ciprofloxacin Hcl 500 Mg Tabs (Ciprofloxacin hcl) .... One twice daily 16)  Ciprofloxacin Hcl 500 Mg Tabs (Ciprofloxacin hcl) .... One twice daily 17)  Zolpidem Tartrate 5 Mg Tabs (Zolpidem tartrate) .... One at bedtime as needed for sleep 18)  Prednisone 5 Mg Tabs (Prednisone) .Marland Kitchen.. 1 by mouth two times a day x3days then qam  Patient Instructions: 1)  Taper prednisone 5 mg as follows: 2)  3 for 3 days, then 2 for 3 days, then one daily. 3)  Keep follow up with Dr Raliegh Ip as scheduled.

## 2010-08-05 NOTE — Progress Notes (Signed)
Summary: REQUEST FOR RX lantus  Phone Note Call from Patient   Caller: Patient Summary of Call: Pt adv that she was given a sample for an insulin pen (lantus pen).....  It worked well and she would like to have Rx for same sent to  [facility].... Pt can be reached at [phone] with any questions or concerns.  Initial call taken by: Duanne Moron,  June 14, 2010 3:41 PM  Follow-up for Phone Call        was efilled at 06/10/10 appt - should be at [place] with refills. KIk Follow-up by: Cay Schillings LPN,  December 12, 624THL 4:12 PM

## 2010-08-05 NOTE — Assessment & Plan Note (Signed)
Summary: HOSP FOLLOW UP..SP   History of Present Illness Visit Type: Initial Visit Primary GI MD: Silvano Rusk MD [facility] Primary Provider: [name], MD Requesting Provider: n/a Chief Complaint: Patient here for f/u hospitalization. She had gastritis on recent EGD. She states that her "stomach is doing better now." History of Present Illness:   77 yo ww that I met when she was hospitalized for abdominal pain in November. No abdominal pain off Januvia (stopped by me). No diarrhea off Metformin (stopped by Dr. Burnice Logan) Appetite is decreased , started Effexor for depression. Dr. Charlestine Night stopped prednisone, she has been very sore ad achy. She was found to be B12 deficient at hospital, had 2 injections started oral Tx. Has not had that rechecked. Started pantoprazole after hopsitalization, had not been on before.            Preventive Screening-Counseling & Management  Alcohol-Tobacco     Smoking Status: quit  Caffeine-Diet-Exercise     Does Patient Exercise: no      Drug Use:  no.      Current Medications (verified): 1)  Daily Multiple Vitamins   Tabs (Multiple Vitamin) .... Take 1 Tablet By Mouth Once A Day 2)  Oscal 500/200 D-3 500-200 Mg-Unit  Tabs (Calcium-Vitamin D) .... One Once Daily 3)  Zocor 40 Mg  Tabs (Simvastatin) .... 1/2 Tab Once Daily 4)  Aspirin 81 Mg  Tbec (Aspirin) .... Take 1 Tablet By Mouth Once A Day 5)  Hydrochlorothiazide 12.5 Mg  Tabs (Hydrochlorothiazide) .... Take 1 Tablet By Mouth Once A Day 6)  Flonase 50 Mcg/act  Susp (Fluticasone Propionate) .... As Needed 7)  Albuterol 90 Mcg/act  Aers (Albuterol) .... As Needed 8)  Accu-Chek Aviva   Kit (Blood Glucose Monitoring Suppl) .... Test Bs Qid 9)  Hydrocodone-Acetaminophen 5-500 Mg Tabs (Hydrocodone-Acetaminophen) .Marland Kitchen.. 1 -2 Q4h As Needed Pain 10)  Zolpidem Tartrate 10 Mg Tabs (Zolpidem Tartrate) .... One At Bedtime 11)  Pantoprazole Sodium 40 Mg Tbec (Pantoprazole Sodium) .... Take 1  Tablet By Mouth Once A Day 30 Minutes Before Breakfast 12)  Lantus Solostar 100 Unit/ml Soln (Insulin Glargine) .... 30 Units At Bedtime 13)  Vitamin B-12 1000 Mcg Tabs (Cyanocobalamin) .... Once Daily X14days 14)  Bd Pen Needle Nano U/f 32g X 4 Mm Misc (Insulin Pen Needle) .... Once Daily and Prn 15)  Humalog Kwikpen 100 Unit/ml Soln (Insulin Lispro (Human)) .... 8 Units Prior To Each Meal; Add 4 Units If Bs Over 200 16)  Venlafaxine Hcl 75 Mg Xr24h-Cap (Venlafaxine Hcl) .... One Every Am 17)  Effexor Xr 75 Mg Xr24h-Cap (Venlafaxine Hcl) .... Take 1 Tablet By Mouth Once A Day  Allergies (verified): 1)  ! * Januvia 2)  ! * Metformin  Past History:  Past Medical History: Reviewed history from 07/15/2010 and no changes required. Hypertension DM Allergies Hyperlipidemia Low back pain Osteoarthritis suspected polymyalgia rheumatica abdominal pain/ history of gastritis, November 2011 B12 deficiency Diverticulosis Hemorrhoids  Past Surgical History: Hysterectomy 2003 Bladder tack 2003 Cholecystectomy  Family History: father died age 26, history of COPD, and asthma mother died age 61 from hip fracture resulting in a pulmonary embolism A brothers 3 sisters posture pneumonia, heart failure, on artery disease, diabetes, also, breast cancer, diabetes, peripheral vascular occlusive disease No FH of Colon Cancer: Family History of Diabetes: Brother  Social History: Patient is a former smoker. -stopped 30 years ago Alcohol Use - no Illicit Drug Use - no Patient does not get regular exercise.  Smoking  Status:  quit Drug Use:  no Does Patient Exercise:  no  Vital Signs:  Patient profile:   77 year old female Height:      63 inches Weight:      193 pounds BMI:     34.31 BSA:     1.91 Pulse rate:   92 / minute Pulse rhythm:   regular BP sitting:   134 / 90  (right arm)  Vitals Entered By: Madlyn Frankel CMA Deborra Medina) (July 20, 2010 2:50 PM)  Physical Exam  General:   obese.  NAD   Impression & Recommendations:  Problem # 1:  EPIGASTRIC PAIN (ICD-789.06) Assessment Improved Thius has resolved. I think it was from [place], most likely. She will stop pantoprazole and see how she does.  Problem # 2:  VITAMIN B12 DEFICIENCY (ICD-266.2) Assessment: New  reassess, ? needs injections or oral Tx  Orders: TLB-B12, Serum-Total ONLY [id number])  Problem # 3:  DIARRHEA, CHRONIC (ICD-787.91) Assessment: Improved this resolved off Metformin  Patient Instructions: 1)  Stop Pantoprazole. 2)  Call if your abdominal pain reoccurs after stopping the Med. 3)  Check your B12 Level today-go to basement floor to have this drawn. 4)  Copy sent to : [name], MD 5)  The medication list was reviewed and reconciled.  All changed / newly prescribed medications were explained.  A complete medication list was provided to the patient / caregiver. 6)  The medication list was reviewed and reconciled.  All changed / newly prescribed medications were explained.  A complete medication list was provided to the patient / caregiver.

## 2010-08-05 NOTE — Letter (Signed)
Summary: New Patient letter  [facility]  [address] [place], [place] [address]   Phone: [phone]  Fax: [phone]       04/26/2010 MRN: [id number]  [facility] [address] [place], [place]  [address]  Dear [name] [name],  Welcome to the Gastroenterology Division at [facility].    You are scheduled to see Dr.  Deatra Ina   on 06-08-10 at 10am on the 3rd floor at [facility], [place] [employer].  We ask that you try to arrive at our office 15 minutes prior to your appointment time to allow for check-in.  We would like you to complete the enclosed self-administered evaluation form prior to your visit and bring it with you on the day of your appointment.  We will review it with you.  Also, please bring a complete list of all your medications or, if you prefer, bring the medication bottles and we will list them.  Please bring your insurance card so that we may make a copy of it.  If your insurance requires a referral to see a specialist, please bring your referral form from your primary care physician.  Co-payments are due at the time of your visit and may be paid by cash, check or credit card.     Your office visit will consist of a consult with your physician (includes a physical exam), any laboratory testing he/she may order, scheduling of any necessary diagnostic testing (e.g. x-ray, ultrasound, CT-scan), and scheduling of a procedure (e.g. Endoscopy, Colonoscopy) if required.  Please allow enough time on your schedule to allow for any/all of these possibilities.    If you cannot keep your appointment, please call [phone] to cancel or reschedule prior to your appointment date.  This allows [place] the opportunity to schedule an appointment for another patient in need of care.  If you do not cancel or reschedule by 5 p.m. the business day prior to your appointment date, you will be charged a $50.00 late cancellation/no-show fee.    Thank you for choosing  [facility] for your medical needs.  We appreciate the opportunity to care for you.  Please visit [place] at our website  to learn more about our practice.                     Sincerely,                                                             The Gastroenterology Division

## 2010-08-05 NOTE — Assessment & Plan Note (Signed)
Summary: abdominal pain/dm   Vital Signs:  Patient profile:   77 year old female Weight:      200 pounds Temp:     98.2 degrees F oral BP sitting:   110 / 74  (left arm) Cuff size:   regular  Vitals Entered By: Cay Schillings LPN (September  9, 624THL 10:54 AM) CC: c/o stomach pain and low back pain - just feel bad      fbs 160 Is Patient Diabetic? Yes Did you bring your meter with you today? No Flu Vaccine Consent Questions     Do you have a history of severe allergic reactions to this vaccine? no    Any prior history of allergic reactions to egg and/or gelatin? no    Do you have a sensitivity to the preservative Thimersol? no    Do you have a past history of Guillan-Barre Syndrome? no    Do you currently have an acute febrile illness? no    Have you ever had a severe reaction to latex? no    Vaccine information given and explained to patient? yes    Are you currently pregnant? no    Lot Number:[id number]   Exp Date:01/01/2011   Site Given  Left Deltoid IM   CC:  c/o stomach pain and low back pain - just feel bad      fbs 160.  History of Present Illness: 77 year old patient who is in today for follow-up of her suspected polymyalgia rheumatica.  She is initially seen on July 13 with shoulder and back pain associated symptoms included anorexia, muscle weakness, and a general sense of unwellness.  Sedimentation rate was 70 and she was placed on 10 mg of prednisone daily with improvement.  She was seen in follow-up 7 days later complain of some chest and trunk discomfort.  She had increasing shoulder and back discomfort since a dose reduction.[facility]  She was next seen on August 5 were sedimentation rate was still 64.  She has type 2 diabetes, which has been well-controlled. Today she complains of fairly diffuse abdominal pain.  She states this is quite intermittent and nonlocalized.  She has rare nausea.  Her weight has been stable at 200 pounds after some initial decrease.  She states  that she has had this intermittent pain now for 3 months, although she has not complained of this until today  Allergies (verified): No Known Drug Allergies  Past History:  Past Medical History: Hypertension DM Allergies Hyperlipidemia Low back pain Osteoarthritis suspected polymyalgia rheumatica  Past Surgical History: Reviewed history from 02/01/2007 and no changes required. Hysterectomy 2003 Bladder tack 2003  Review of Systems       The patient complains of anorexia and abdominal pain.  The patient denies fever, weight loss, weight gain, vision loss, decreased hearing, hoarseness, chest pain, syncope, dyspnea on exertion, peripheral edema, prolonged cough, headaches, hemoptysis, melena, hematochezia, severe indigestion/heartburn, hematuria, incontinence, genital sores, muscle weakness, suspicious skin lesions, transient blindness, difficulty walking, depression, unusual weight change, abnormal bleeding, enlarged lymph nodes, angioedema, and breast masses.    Physical Exam  General:  overweight-appearing.  no apparent distress.  Normal blood pressure Head:  Normocephalic and atraumatic without obvious abnormalities. No apparent alopecia or balding. Eyes:  No corneal or conjunctival inflammation noted. EOMI. Perrla. Funduscopic exam benign, without hemorrhages, exudates or papilledema. Vision grossly normal. Mouth:  Oral mucosa and oropharynx without lesions or exudates.  Teeth in good repair. Neck:  No deformities, masses, or tenderness noted. Lungs:  Normal respiratory effort, chest expands symmetrically. Lungs are clear to auscultation, no crackles or wheezes. Heart:  Normal rate and regular rhythm. S1 and S2 normal without gallop, murmur, click, rub or other extra sounds. Abdomen:  soft without or guarding.  Bowel sounds normal.  She has fairly diffuse subjective tenderness Msk:  No deformity or scoliosis noted of thoracic or lumbar spine.     Impression &  Recommendations:  Problem # 1:  ABDOMINAL PAIN (ICD-789.00)  Orders: Venipuncture [id number]) TLB-BMP (Basic Metabolic Panel-BMET) (99991111) TLB-CBC Platelet - w/Differential (85025-CBCD) TLB-Hepatic/Liver Function Pnl (80076-HEPATIC) TLB-TSH (Thyroid Stimulating Hormone) (84443-TSH) TLB-Amylase (82150-AMYL) TLB-Sedimentation Rate (ESR) (85652-ESR) Specimen Handling (99000)  Problem # 2:  POLYMYALGIA RHEUMATICA (ICD-725)  Orders: Venipuncture [id number]) TLB-BMP (Basic Metabolic Panel-BMET) (99991111) TLB-CBC Platelet - w/Differential (85025-CBCD) TLB-Hepatic/Liver Function Pnl (80076-HEPATIC) TLB-TSH (Thyroid Stimulating Hormone) (84443-TSH) TLB-Amylase (82150-AMYL) TLB-Sedimentation Rate (ESR) (85652-ESR) Specimen Handling (99000)  Problem # 3:  OSTEOARTHRITIS (ICD-715.90)  Her updated medication list for this problem includes:    Aspirin 81 Mg Tbec (Aspirin) .Marland Kitchen... Take 1 tablet by mouth once a day    Mobic 7.5 Mg Tabs (Meloxicam) .[facility]... 1 by mouth two times a day    Hydrocodone-acetaminophen 5-500 Mg Tabs (Hydrocodone-acetaminophen) .Marland Kitchen... 1 -2 q4h as needed pain  Orders: Venipuncture [id number]) TLB-BMP (Basic Metabolic Panel-BMET) (99991111) TLB-CBC Platelet - w/Differential (85025-CBCD) TLB-Hepatic/Liver Function Pnl (80076-HEPATIC) TLB-TSH (Thyroid Stimulating Hormone) (84443-TSH) TLB-Amylase (82150-AMYL) TLB-Sedimentation Rate (ESR) (85652-ESR) Specimen Handling (99000)  Complete Medication List: 1)  Daily Multiple Vitamins Tabs (Multiple vitamin) .... Take 1 tablet by mouth once a day 2)  Oscal 500/200 D-3 500-200 Mg-unit Tabs (Calcium-vitamin d) .... One once daily 3)  Zocor 40 Mg Tabs (Simvastatin) .... 1/2 tab once daily 4)  Amaryl 4 Mg Tabs (Glimepiride) .... Take 1 tablet by mouth two times a day 5)  Aspirin 81 Mg Tbec (Aspirin) .... Take 1 tablet by mouth once a day 6)  Hydrochlorothiazide 12.5 Mg Tabs (Hydrochlorothiazide) .... Take 1 tablet by mouth  once a day 7)  Flonase 50 Mcg/act Susp (Fluticasone propionate) .... As needed 8)  Albuterol 90 Mcg/act Aers (Albuterol) .... As needed 9)  Mobic 7.5 Mg Tabs (Meloxicam) .Marland Kitchen.. 1 by mouth two times a day 10)  Accu-chek Aviva Kit (Blood glucose monitoring suppl) .... Test bs qid 11)  Hydrocodone-acetaminophen 5-500 Mg Tabs (Hydrocodone-acetaminophen) .Marland Kitchen.. 1 -2 q4h as needed pain 12)  Cyclobenzaprine Hcl 5 Mg Tabs (Cyclobenzaprine hcl) .... One twice daily as needed for back pain 13)  Januvia 100 Mg Tabs (Sitagliptin phosphate) .... One daily 14)  Actos 45 Mg Tabs (Pioglitazone hcl) .... One daily 15)  Prednisone 5 Mg Tabs (Prednisone) .Marland Kitchen.. 1 1/2 tablet every am 16)  Zolpidem Tartrate 10 Mg Tabs (Zolpidem tartrate) .... One at bedtime  Other Orders: UA Dipstick w/o Micro (manual) (81002) Flu Vaccine 30yrs + MEDICARE PATIENTS [id number]) Administration Flu vaccine - MCR [id number])  Patient Instructions: 1)  Please schedule a follow-up appointment in 2 weeks. 2)  Avoid foods high in acid (tomatoes, citrus juices, spicy foods). Avoid eating within two hours of lying down or before exercising. Do not over eat; try smaller more frequent meals. Elevate head of bed twelve inches when sleeping. 3)  stop mobic 4)  prednisone 2 am one PM  Laboratory Results   Urine Tests  Date/Time Received: March 12, 2010 11:11 AM  Date/Time Reported: March 12, 2010 11:11 AM   Routine Urinalysis   Color: yellow Appearance: Clear Glucose: negative   (Normal  Range: Negative) Bilirubin: negative   (Normal Range: Negative) Ketone: negative   (Normal Range: Negative) Spec. Gravity: 1.025   (Normal Range: 1.003-1.035) Blood: negative   (Normal Range: Negative) pH: 5.0   (Normal Range: 5.0-8.0) Protein: 100   (Normal Range: Negative) Urobilinogen: 0.2   (Normal Range: 0-1) Nitrite: negative   (Normal Range: Negative) Leukocyte Esterace: negative   (Normal Range: Negative)

## 2010-08-05 NOTE — Progress Notes (Signed)
Summary: results  Phone Note Call from Patient Call back at Home Phone [phone]   Caller: vm Reason for Call: Lab or Test Results Summary of Call: Test done Fri.   Initial call taken by: Shelbie Hutching, RN,  April 12, 2010 12:21 PM  Follow-up for Phone Call        CT abdomen is normal where is sed rate?? Follow-up by: Marletta Lor  MD,  April 12, 2010 12:50 PM  Additional Follow-up for Phone Call Additional follow up Details #1::        spoke with pt - didnt havee any lab drawn last wk when she was here - will come tomorrow . KIK Additional Follow-up by: Cay Schillings LPN,  October 10, 624THL 1:44 PM

## 2010-08-05 NOTE — Progress Notes (Signed)
Summary: refill prednisone  Phone Note Refill Request Message from:  Fax from Pharmacy on March 30, 2010 9:20 AM  Refills Requested: Medication #1:  PREDNISONE 5 MG TABS 2 by mouth qam and 1 by mouth qhs [facility] [address]   Method Requested: Fax to [facility] Initial call taken by: Cay Schillings LPN,  September 27, 624THL 9:20 AM    Prescriptions: PREDNISONE 5 MG TABS (PREDNISONE) 2 by mouth qam and 1 by mouth qhs  #180 x 0   Entered by:   Cay Schillings LPN   Authorized by:   Marletta Lor  MD   Signed by:   Cay Schillings LPN on [id number]   Method used:   [facility]ed to ...       [facility]       [address]       [place], [place]  [address]       Ph: [phone]       Fax: [phone]   RxID:   [id number]  faxed to [facility]

## 2010-08-05 NOTE — Assessment & Plan Note (Signed)
Summary: ?bladder inf/njr 3pm   Vital Signs:  Patient profile:   77 year old female Weight:      202 pounds Temp:     97.9 degrees F oral BP sitting:   128 / 70  (right arm) Cuff size:   regular  Vitals Entered By: Cay Schillings LPN (April 26, 624THL 075-GRM PM) CC: c/o burning with urination, frequency      fbs 150 Is Patient Diabetic? Yes Did you bring your meter with you today? No   CC:  c/o burning with urination and frequency      fbs 150.  History of Present Illness: 77 year old patient with a 3 day history of burning, dysuria and frequency.  She has a history of hypertension and type 2 diabetes, which has been stable. denies any fever, chills, or flank pain.  She is maintained on glycemic control  Preventive Screening-Counseling & Management  Alcohol-Tobacco     Smoking Status: never  Allergies (verified): No Known Drug Allergies  Social History: Smoking Status:  never  Physical Exam  General:  overweight-appearing.  normal blood pressure, afebrileoverweight-appearing.   Lungs:  Normal respiratory effort, chest expands symmetrically. Lungs are clear to auscultation, no crackles or wheezes. Heart:  Normal rate and regular rhythm. S1 and S2 normal without gallop, murmur, click, rub or other extra sounds. Abdomen:  no CVA tenderness.  No suprapubic tenderness   Impression & Recommendations:  Problem # 1:  UTI (ICD-599.0)  Her updated medication list for this problem includes:    Ciprofloxacin Hcl 500 Mg Tabs (Ciprofloxacin hcl) ..... One twice daily  Her updated medication list for this problem includes:    Ciprofloxacin Hcl 500 Mg Tabs (Ciprofloxacin hcl) ..... One twice daily  Problem # 2:  DM (ICD-250.00)  Her updated medication list for this problem includes:    Amaryl 4 Mg Tabs (Glimepiride) .Marland Kitchen... Take 1 tablet by mouth two times a day    Aspirin 81 Mg Tbec (Aspirin) .Marland Kitchen... Take 1 tablet by mouth once a day    Januvia 100 Mg Tabs (Sitagliptin phosphate)  ..... One daily    Actos 45 Mg Tabs (Pioglitazone hcl) ..... One daily  Her updated medication list for this problem includes:    Amaryl 4 Mg Tabs (Glimepiride) .Marland Kitchen... Take 1 tablet by mouth two times a day    Aspirin 81 Mg Tbec (Aspirin) .Marland Kitchen... Take 1 tablet by mouth once a day    Januvia 100 Mg Tabs (Sitagliptin phosphate) ..... One daily    Actos 45 Mg Tabs (Pioglitazone hcl) ..... One daily  Complete Medication List: 1)  Daily Multiple Vitamins Tabs (Multiple vitamin) .... Take 1 tablet by mouth once a day 2)  Oscal 500/200 D-3 500-200 Mg-unit Tabs (Calcium-vitamin d) .... One once daily 3)  Zocor 40 Mg Tabs (Simvastatin) .... 1/2 tab once daily 4)  Amaryl 4 Mg Tabs (Glimepiride) .... Take 1 tablet by mouth two times a day 5)  Aspirin 81 Mg Tbec (Aspirin) .... Take 1 tablet by mouth once a day 6)  Hydrochlorothiazide 12.5 Mg Tabs (Hydrochlorothiazide) .... Take 1 tablet by mouth once a day 7)  Flonase 50 Mcg/act Susp (Fluticasone propionate) .... As needed 8)  Albuterol 90 Mcg/act Aers (Albuterol) .... As needed 9)  Mobic 7.5 Mg Tabs (Meloxicam) .Marland Kitchen.. 1 by mouth two times a day 10)  Accu-chek Aviva Kit (Blood glucose monitoring suppl) .... Test bs qid 11)  Hydrocodone-acetaminophen 5-500 Mg Tabs (Hydrocodone-acetaminophen) .Marland Kitchen.. 1 -2 q4h as needed pain 12)  Cyclobenzaprine Hcl 5 Mg Tabs (Cyclobenzaprine hcl) .... One twice daily as needed for back pain 13)  Januvia 100 Mg Tabs (Sitagliptin phosphate) .... One daily 14)  Actos 45 Mg Tabs (Pioglitazone hcl) .... One daily 15)  Ciprofloxacin Hcl 500 Mg Tabs (Ciprofloxacin hcl) .... One twice daily  Patient Instructions: 1)  Drink as much fluid as you can tolerate for the next few days. 2)  Take your antibiotic as prescribed until ALL of it is gone, but stop if you develop a rash or swelling and contact our office as soon as possible. Prescriptions: CIPROFLOXACIN HCL 500 MG TABS (CIPROFLOXACIN HCL) one twice daily  #14 x 0   Entered and  Authorized by:   Marletta Lor  MD   Signed by:   Marletta Lor  MD on 10/27/2009   Method used:   Electronically to        [facility] (retail)       [address]       [place], [place]  [address]       Ph: [phone]       Fax: [phone]   RxIDZI:[id number]   Appended Document: ?bladder inf/njr 3pm     Allergies: No Known Drug Allergies   Complete Medication List: 1)  Daily Multiple Vitamins Tabs (Multiple vitamin) .... Take 1 tablet by mouth once a day 2)  Oscal 500/200 D-3 500-200 Mg-unit Tabs (Calcium-vitamin d) .... One once daily 3)  Zocor 40 Mg Tabs (Simvastatin) .... 1/2 tab once daily 4)  Amaryl 4 Mg Tabs (Glimepiride) .... Take 1 tablet by mouth two times a day 5)  Aspirin 81 Mg Tbec (Aspirin) .... Take 1 tablet by mouth once a day 6)  Hydrochlorothiazide 12.5 Mg Tabs (Hydrochlorothiazide) .... Take 1 tablet by mouth once a day 7)  Flonase 50 Mcg/act Susp (Fluticasone propionate) .... As needed 8)  Albuterol 90 Mcg/act Aers (Albuterol) .... As needed 9)  Mobic 7.5 Mg Tabs (Meloxicam) .Marland Kitchen.. 1 by mouth two times a day 10)  Accu-chek Aviva Kit (Blood glucose monitoring suppl) .... Test bs qid 11)  Hydrocodone-acetaminophen 5-500 Mg Tabs (Hydrocodone-acetaminophen) .Marland Kitchen.. 1 -2 q4h as needed pain 12)  Cyclobenzaprine Hcl 5 Mg Tabs (Cyclobenzaprine hcl) .... One twice daily as needed for back pain 13)  Januvia 100 Mg Tabs (Sitagliptin phosphate) .... One daily 14)  Actos 45 Mg Tabs (Pioglitazone hcl) .... One daily 15)  Ciprofloxacin Hcl 500 Mg Tabs (Ciprofloxacin hcl) .... One twice daily   Laboratory Results   Urine Tests  Date/Time Received: October 27, 2009 3:39 PM  Date/Time Reported: October 27, 2009 3:40 PM   Routine Urinalysis   Color: orange Glucose: negative   (Normal Range: Negative) Bilirubin: negative   (Normal Range: Negative) Ketone: negative   (Normal Range: Negative) Spec. Gravity: 1.020   (Normal  Range: 1.003-1.035) Blood: negative   (Normal Range: Negative) pH: 6.0   (Normal Range: 5.0-8.0) Protein: >=300   (Normal Range: Negative) Urobilinogen: 0.2   (Normal Range: 0-1) Nitrite: positive   (Normal Range: Negative) Leukocyte Esterace: moderate   (Normal Range: Negative)

## 2010-08-05 NOTE — Progress Notes (Signed)
Summary: Pt needs new script for Metformin 500mg . Old meds expired  Phone Note Refill Request Call back at Home Phone [phone] Message from:  son-Alton  Refills Requested: Medication #1:  METFORMIN HCL 500 MG XR24H-TAB one daily   Dosage confirmed as above?Dosage Confirmed   Supply Requested: 3 months Pts old meds at home had expired. Pls call in new script for the 500mg  dose of Metformin to [facility] on [address], in addition to pts other meds.     Method Requested: Telephone to Pharmacy Initial call taken by: Braulio Bosch,  June 10, 2010 1:40 PM  Follow-up for Phone Call        this was efilled at appt today by Dr. Nonie Hoyer Follow-up by: Cay Schillings LPN,  December  8, 624THL 3:44 PM

## 2010-08-05 NOTE — Progress Notes (Signed)
Summary: stay off Januvia, gastric path negative  Phone Note Outgoing Call   Summary of Call: Let her know pathology showed minimal inflammation we need to see how she does off Januvia and on PPI I think Januvia may have had a lot to do with pain. see me 4-6 weeks Gatha Mayer MD, [facility]  June 04, 2010 7:59 AM   Follow-up for Phone Call        LM to [facility] at home number [place] Deborra Medina)  June 08, 2010 10:39 AM   Advised pt of above.  She is not taking Januvia and will remain off of that.  She was given pantoprazole on discharge instructions and I will make sure she has refills at pharmacy.  Appt scheuled for 1/17 with Dr. Carlean Purl  Follow-up by: Abelino Derrick CMA Deborra Medina),  June 09, 2010 11:48 AM    New/Updated Medications: * HOLD**JANUVIA 100 MG TABS (SITAGLIPTIN PHOSPHATE) one daily PANTOPRAZOLE SODIUM 40 MG TBEC (PANTOPRAZOLE SODIUM) Take 1 tablet by mouth once a day 30 minutes before breakfast Prescriptions: PANTOPRAZOLE SODIUM 40 MG TBEC (PANTOPRAZOLE SODIUM) Take 1 tablet by mouth once a day 30 minutes before breakfast  #30 x 6   Entered by:   Abelino Derrick CMA ([place])   Authorized by:   Gatha Mayer MD, [facility]   Signed by:   Abelino Derrick CMA ([place]) on 06/09/2010   Method used:   Electronically to        [facility] (retail)       [address]       [place], [place]  [address]       Ph: [phone]       Fax: [phone]   RxID:   [id number]

## 2010-08-05 NOTE — Assessment & Plan Note (Signed)
Summary: 2 month fup//ccm   Vital Signs:  Patient profile:   77 year old female Weight:      195 pounds Temp:     97.5 degrees F oral BP sitting:   128 / 80  (right arm) Cuff size:   regular  Vitals Entered By: Cay Schillings LPN (January  9, [id number] 3:11 PM) CC: 2 mos rov - shoulder pain, depression     FBS running 200's Is Patient Diabetic? Yes Did you bring your meter with you today? No CBG Result 368   CC:  2 mos rov - shoulder pain and depression     FBS running 200's.  History of Present Illness: 77 year old patient who is seen today for follow-up.  She has a history of type 2 diabetes and medical treatment includes  metformin at a dose of 500 mg daily.  She continues to have considerable diarrhea.  She now is on Lantus insulin 25 units at bedtime.  She did have a very poor glycemic control.  She is followed by rheumatology for suspected polymyalgia rheumatica.  She is scheduled for GI follow-up soon.  Blood sugars are consistently in the two to 300 range.  She states her fasting blood sugars are never less than 200. She has felt poorly for some time and her depression has worsened.  She has had benefit from Effexor in the past.  Allergies (verified): No Known Drug Allergies  Past History:  Past Medical History: Reviewed history from 06/10/2010 and no changes required. Hypertension DM Allergies Hyperlipidemia Low back pain Osteoarthritis suspected polymyalgia rheumatica abdominal pain/ history of gastritis, November 2011 B12 deficiency  Family History: Reviewed history from 05/25/2007 and no changes required. father died age 57, history of COPD, and asthma mother died age 102 from hip fracture resulting in a pulmonary embolism  A brothers 3 sisters posture pneumonia, heart failure, on artery disease, diabetes, also, breast cancer, diabetes, peripheral vascular occlusive disease  Review of Systems       The patient complains of anorexia, weight loss, muscle  weakness, and depression.  The patient denies fever, weight gain, vision loss, decreased hearing, hoarseness, chest pain, syncope, dyspnea on exertion, peripheral edema, prolonged cough, headaches, hemoptysis, abdominal pain, melena, hematochezia, severe indigestion/heartburn, hematuria, incontinence, genital sores, suspicious skin lesions, transient blindness, unusual weight change, abnormal bleeding, enlarged lymph nodes, angioedema, and breast masses.    Physical Exam  General:  overweight-appearing.  no acute distress.  Blood pressure 110/70overweight-appearing.   Head:  Normocephalic and atraumatic without obvious abnormalities. No apparent alopecia or balding. Eyes:  No corneal or conjunctival inflammation noted. EOMI. Perrla. Funduscopic exam benign, without hemorrhages, exudates or papilledema. Vision grossly normal. Mouth:  Oral mucosa and oropharynx without lesions or exudates.  Teeth in good repair. Neck:  No deformities, masses, or tenderness noted. Lungs:  Normal respiratory effort, chest expands symmetrically. Lungs are clear to auscultation, no crackles or wheezes. Heart:  Normal rate and regular rhythm. S1 and S2 normal without gallop, murmur, click, rub or other extra sounds. Abdomen:  Bowel sounds positive,abdomen soft and non-tender without masses, organomegaly or hernias noted.   Impression & Recommendations:  Problem # 1:  DM (ICD-250.00)  Her updated medication list for this problem includes:    Aspirin 81 Mg Tbec (Aspirin) .Marland Kitchen... Take 1 tablet by mouth once a day    Lantus Solostar 100 Unit/ml Soln (Insulin glargine) .Marland KitchenMarland KitchenMarland KitchenMarland Kitchen 30 units at bedtime    Metformin Hcl 500 Mg Xr24h-tab (Metformin hcl) ..... One daily  Humalog Kwikpen 100 Unit/ml Soln (Insulin lispro (human)) .Marland Kitchen... 8 units prior to each meal; add 4 units if bs over 200  Her updated medication list for this problem includes:    Aspirin 81 Mg Tbec (Aspirin) .Marland Kitchen... Take 1 tablet by mouth once a day    Lantus  Solostar 100 Unit/ml Soln (Insulin glargine) .Marland KitchenMarland KitchenMarland KitchenMarland Kitchen 30 units at bedtime    Metformin Hcl 500 Mg Xr24h-tab (Metformin hcl) ..... One daily    Humalog Kwikpen 100 Unit/ml Soln (Insulin lispro (human)) .Marland Kitchen... 8 units prior to each meal; add 4 units if bs over 200  Problem # 2:  DEGENERATIVE JOINT DISEASE, GENERALIZED (ICD-715.00)  Her updated medication list for this problem includes:    Aspirin 81 Mg Tbec (Aspirin) .Marland Kitchen... Take 1 tablet by mouth once a day    Hydrocodone-acetaminophen 5-500 Mg Tabs (Hydrocodone-acetaminophen) .Marland Kitchen... 1 -2 q4h as needed pain  Her updated medication list for this problem includes:    Aspirin 81 Mg Tbec (Aspirin) .Marland Kitchen... Take 1 tablet by mouth once a day    Hydrocodone-acetaminophen 5-500 Mg Tabs (Hydrocodone-acetaminophen) .Marland Kitchen... 1 -2 q4h as needed pain  Complete Medication List: 1)  Daily Multiple Vitamins Tabs (Multiple vitamin) .... Take 1 tablet by mouth once a day 2)  Oscal 500/200 D-3 500-200 Mg-unit Tabs (Calcium-vitamin d) .... One once daily 3)  Zocor 40 Mg Tabs (Simvastatin) .... 1/2 tab once daily 4)  Aspirin 81 Mg Tbec (Aspirin) .... Take 1 tablet by mouth once a day 5)  Hydrochlorothiazide 12.5 Mg Tabs (Hydrochlorothiazide) .... Take 1 tablet by mouth once a day 6)  Flonase 50 Mcg/act Susp (Fluticasone propionate) .... As needed 7)  Albuterol 90 Mcg/act Aers (Albuterol) .... As needed 8)  Accu-chek Aviva Kit (Blood glucose monitoring suppl) .... Test bs qid 9)  Hydrocodone-acetaminophen 5-500 Mg Tabs (Hydrocodone-acetaminophen) .Marland Kitchen.. 1 -2 q4h as needed pain 10)  Zolpidem Tartrate 10 Mg Tabs (Zolpidem tartrate) .... One at bedtime 11)  Pantoprazole Sodium 40 Mg Tbec (Pantoprazole sodium) .... Take 1 tablet by mouth once a day 30 minutes before breakfast 12)  Lantus Solostar 100 Unit/ml Soln (Insulin glargine) .... 30 units at bedtime 13)  Vitamin B-12 1000 Mcg Tabs (Cyanocobalamin) .... Once daily x14days 14)  Metformin Hcl 500 Mg Xr24h-tab (Metformin hcl)  .... One daily 15)  Bd Pen Needle Nano U/f 32g X 4 Mm Misc (Insulin pen needle) .... Once daily and prn 16)  Humalog Kwikpen 100 Unit/ml Soln (Insulin lispro (human)) .... 8 units prior to each meal; add 4 units if bs over 200 17)  Venlafaxine Hcl 75 Mg Xr24h-cap (Venlafaxine hcl) .... One every am  Other Orders: Capillary Blood Glucose/CBG [id number])  Patient Instructions: 1)  Please schedule a follow-up appointment in 2 weeks. 2)  It is important that you exercise regularly at least 20 minutes 5 times a week. If you develop chest pain, have severe difficulty breathing, or feel very tired , stop exercising immediately and seek medical attention. 3)  start mealtime short-acting insulin as discussed 4)  If fasting blood sugar is greater than 200, Increase Lantus by 4 units every 3 days  Prescriptions: VENLAFAXINE HCL 75 MG XR24H-CAP (VENLAFAXINE HCL) one every am  #90 x 3   Entered and Authorized by:   Marletta Lor  MD   Signed by:   Marletta Lor  MD on 07/12/2010   Method used:   Electronically to        [facility] (retail)  [address]       [place], [place]  [address]       Ph: [phone]       Fax: [phone]   RxID:   [id number]    Orders Added: 1)  Capillary Blood Glucose/CBG [82948] 2)  Est. Patient Level III [id number]  Appended Document: 2 month fup//ccm Addendum-  diabetes mellitus, type II-poorly controlled ( unstable)  plan-  lifestyle issues discussed at length, including more exercise and weight loss.  Will intensify both basal and mealtime insulin treatment

## 2010-08-05 NOTE — Assessment & Plan Note (Signed)
Summary: 3 wk rov/njr   Vital Signs:  Patient profile:   77 year old female Weight:      200 pounds Temp:     98.1 degrees F oral BP sitting:   110 / 70  (right arm) Cuff size:   regular  Vitals Entered By: Cay Schillings LPN (August  5, 624THL 3:24 PM) CC: f/u - some improvement       fbs 94 Is Patient Diabetic? Yes Did you bring your meter with you today? No   CC:  f/u - some improvement       fbs 94.  History of Present Illness: 77 year old patient seen today for follow-up of her diabetes.  She is also low-dose prednisone due to possible polymyalgia rheumatica.  She is down to 5 mg daily and has had some increasing chest Auman shoulder discomfort.  She has maintained very nice diabetic control.  Her sedimentation was 70 prior to a prednisone therapy  Allergies (verified): No Known Drug Allergies  Physical Exam  General:  overweight-appearing.  normal blood pressureoverweight-appearing.     Impression & Recommendations:  Problem # 1:  POLYMYALGIA RHEUMATICA ([id number])  Orders: Sedimentation Rate, non-automated [id number]) Venipuncture [id number])  Problem # 2:  DM (ICD-250.00)  Her updated medication list for this problem includes:    Amaryl 4 Mg Tabs (Glimepiride) .Marland Kitchen... Take 1 tablet by mouth two times a day    Aspirin 81 Mg Tbec (Aspirin) .Marland Kitchen... Take 1 tablet by mouth once a day    Januvia 100 Mg Tabs (Sitagliptin phosphate) ..... One daily    Actos 45 Mg Tabs (Pioglitazone hcl) ..... One daily  Her updated medication list for this problem includes:    Amaryl 4 Mg Tabs (Glimepiride) .Marland Kitchen... Take 1 tablet by mouth two times a day    Aspirin 81 Mg Tbec (Aspirin) .Marland Kitchen... Take 1 tablet by mouth once a day    Januvia 100 Mg Tabs (Sitagliptin phosphate) ..... One daily    Actos 45 Mg Tabs (Pioglitazone hcl) ..... One daily  Complete Medication List: 1)  Daily Multiple Vitamins Tabs (Multiple vitamin) .... Take 1 tablet by mouth once a day 2)  Oscal 500/200 D-3 500-200 Mg-unit  Tabs (Calcium-vitamin d) .... One once daily 3)  Zocor 40 Mg Tabs (Simvastatin) .... 1/2 tab once daily 4)  Amaryl 4 Mg Tabs (Glimepiride) .... Take 1 tablet by mouth two times a day 5)  Aspirin 81 Mg Tbec (Aspirin) .... Take 1 tablet by mouth once a day 6)  Hydrochlorothiazide 12.5 Mg Tabs (Hydrochlorothiazide) .... Take 1 tablet by mouth once a day 7)  Flonase 50 Mcg/act Susp (Fluticasone propionate) .... As needed 8)  Albuterol 90 Mcg/act Aers (Albuterol) .... As needed 9)  Mobic 7.5 Mg Tabs (Meloxicam) .Marland Kitchen.. 1 by mouth two times a day 10)  Accu-chek Aviva Kit (Blood glucose monitoring suppl) .... Test bs qid 11)  Hydrocodone-acetaminophen 5-500 Mg Tabs (Hydrocodone-acetaminophen) .Marland Kitchen.. 1 -2 q4h as needed pain 12)  Cyclobenzaprine Hcl 5 Mg Tabs (Cyclobenzaprine hcl) .... One twice daily as needed for back pain 13)  Januvia 100 Mg Tabs (Sitagliptin phosphate) .... One daily 14)  Actos 45 Mg Tabs (Pioglitazone hcl) .... One daily 15)  Prednisone 5 Mg Tabs (Prednisone) .Marland Kitchen.. 1 1/2 tablet every am 16)  Zolpidem Tartrate 10 Mg Tabs (Zolpidem tartrate) .... One at bedtime  Patient Instructions: 1)  Please schedule a follow-up appointment in 6  weeks 2)  Limit your Sodium (Salt) to less than 2 grams a  day(slightly less than 1/2 a teaspoon) to prevent fluid retention, swelling, or worsening of symptoms. 3)  Check your blood sugars regularly. If your readings are usually above : or below 70 you should contact our office. 4)  It is important that your Diabetic A1c level is checked every 3 months. Prescriptions: PREDNISONE 5 MG TABS (PREDNISONE) 1 1/2 tablet every AM  #100 x 2   Entered and Authorized by:   Marletta Lor  MD   Signed by:   Marletta Lor  MD on 02/05/2010   Method used:   Print then Give to Patient   RxID:   [id number] ZOLPIDEM TARTRATE 10 MG TABS (ZOLPIDEM TARTRATE) one at bedtime  #30 x 3   Entered and Authorized by:   Marletta Lor  MD   Signed by:   Marletta Lor  MD on 02/05/2010   Method used:   Print then Give to Patient   RxID:   [id number]   Appended Document: 3 wk rov/njr  Laboratory Results   Blood Tests     SED rate: 64 mm/hr  Comments: Joyce Gross  February 05, 2010 4:50 PM

## 2010-08-05 NOTE — Progress Notes (Signed)
  Phone Note Call from Patient Call back at Home Phone [phone]   Caller: Patient Call For: Marletta Lor  MD Reason for Call: Privacy/Consent Authorization Summary of Call: Requesting lab results. BP is still elevated and abdominal pain is still bothering her.pt states her bp med and lipid meds were stopped until asfter blood work and she doesnt know if she should go back on it or not Initial call taken by: Deanna Artis CMA AAMA,  April 23, 2010 1:55 PM  Follow-up for Phone Call        dr Raliegh Ip talked to her on friday Follow-up by: Allyne Gee, LPN,  October 24, 624THL 8:11 AM

## 2010-08-05 NOTE — Assessment & Plan Note (Signed)
Summary: UTI, SOB, mylagias/dm   Vital Signs:  Patient profile:   77 year old female Weight:      203 pounds Temp:     98.3 degrees F oral BP sitting:   128 / 80  (right arm) Cuff size:   regular  Vitals Entered By: Cay Schillings LPN (July 13, 624THL QA348G AM) CC: c/o burning with urination, c/o SOB and shoulder pain      [id number] Is Patient Diabetic? Yes Did you bring your meter with you today? No   CC:  c/o burning with urination and c/o SOB and shoulder pain      fbs133.  History of Present Illness: 77 year old patient who has a history of type 2 diabetes, osteoarthritis, dyslipidemia, hypertension, for the past several weeks.  She has had increasing upper shoulder and back pain.  She describes some anorexia and weakness for the past few days.  She has had the onset of some burning dysuria as well.  These new symptoms are different from her chronic arthritic complaints.  Medical regimen includes analgesics, as well as anti-inflammatory medication.  Her diabetes has been stable and radom  blood sugar 133  Preventive Screening-Counseling & Management  Alcohol-Tobacco     Smoking Status: never  Allergies (verified): No Known Drug Allergies  Past History:  Past Medical History: Hypertension DM Allergies Hyperlipidemia Low back pain Osteoarthritis  Family History: Reviewed history from 05/25/2007 and no changes required. father died age 57, history of COPD, and asthma mother died age 51 from hip fracture resulting in a pulmonary embolism  A brothers 3 sisters posture pneumonia, heart failure, on artery disease, diabetes, also, breast cancer, diabetes, peripheral vascular occlusive disease  Review of Systems       The patient complains of anorexia, dyspnea on exertion, muscle weakness, and difficulty walking.  The patient denies fever, weight loss, weight gain, vision loss, decreased hearing, hoarseness, chest pain, syncope, peripheral edema, prolonged cough,  headaches, hemoptysis, abdominal pain, melena, hematochezia, severe indigestion/heartburn, hematuria, incontinence, genital sores, suspicious skin lesions, transient blindness, depression, unusual weight change, abnormal bleeding, angioedema, and breast masses.    Physical Exam  General:  overweight-appearing. no  distress, normal blood pressureoverweight-appearing.   Head:  Normocephalic and atraumatic without obvious abnormalities. No apparent alopecia or balding. Eyes:  No corneal or conjunctival inflammation noted. EOMI. Perrla. Funduscopic exam benign, without hemorrhages, exudates or papilledema. Vision grossly normal. Mouth:  Oral mucosa and oropharynx without lesions or exudates.  Teeth in good repair. Neck:  No deformities, masses, or tenderness noted. Lungs:  Normal respiratory effort, chest expands symmetrically. Lungs are clear to auscultation, no crackles or wheezes. Heart:  Normal rate and regular rhythm. S1 and S2 normal without gallop, murmur, click, rub or other extra sounds. Abdomen:  Bowel sounds positive,abdomen soft and non-tender without masses, organomegaly or hernias noted. Msk:  no signs of active synovitis Pulses:  R and L carotid,radial,femoral,dorsalis pedis and posterior tibial pulses are full and equal bilaterally Extremities:  No clubbing, cyanosis, edema, or deformity noted with normal full range of motion of all joints.     Impression & Recommendations:  Problem # 1:  OSTEOARTHRITIS (ICD-715.90)  Her updated medication list for this problem includes:    Aspirin 81 Mg Tbec (Aspirin) .Marland Kitchen... Take 1 tablet by mouth once a day    Mobic 7.5 Mg Tabs (Meloxicam) .Marland Kitchen... 1 by mouth two times a day    Hydrocodone-acetaminophen 5-500 Mg Tabs (Hydrocodone-acetaminophen) .Marland Kitchen... 1 -2 q4h as needed pain  Orders:  Sedimentation Rate, non-automated [id number])  Her updated medication list for this problem includes:    Aspirin 81 Mg Tbec (Aspirin) .Marland Kitchen... Take 1 tablet by mouth once  a day    Mobic 7.5 Mg Tabs (Meloxicam) .Marland Kitchen... 1 by mouth two times a day    Hydrocodone-acetaminophen 5-500 Mg Tabs (Hydrocodone-acetaminophen) .Marland Kitchen... 1 -2 q4h as needed pain  Problem # 2:  LOW BACK PAIN (ICD-724.2)  Her updated medication list for this problem includes:    Aspirin 81 Mg Tbec (Aspirin) .Marland Kitchen... Take 1 tablet by mouth once a day    Mobic 7.5 Mg Tabs (Meloxicam) .Marland Kitchen... 1 by mouth two times a day    Hydrocodone-acetaminophen 5-500 Mg Tabs (Hydrocodone-acetaminophen) .Marland Kitchen... 1 -2 q4h as needed pain    Cyclobenzaprine Hcl 5 Mg Tabs (Cyclobenzaprine hcl) ..... One twice daily as needed for back pain  Orders: Sedimentation Rate, non-automated [id number])  Her updated medication list for this problem includes:    Aspirin 81 Mg Tbec (Aspirin) .Marland Kitchen... Take 1 tablet by mouth once a day    Mobic 7.5 Mg Tabs (Meloxicam) .Marland Kitchen... 1 by mouth two times a day    Hydrocodone-acetaminophen 5-500 Mg Tabs (Hydrocodone-acetaminophen) .Marland Kitchen... 1 -2 q4h as needed pain    Cyclobenzaprine Hcl 5 Mg Tabs (Cyclobenzaprine hcl) ..... One twice daily as needed for back pain  Problem # 3:  DYSURIA (ICD-788.1)  Her updated medication list for this problem includes:    Ciprofloxacin Hcl 500 Mg Tabs (Ciprofloxacin hcl) ..... One twice daily  Orders: UA Dipstick w/o Micro (manual) (81002)  Her updated medication list for this problem includes:    Ciprofloxacin Hcl 500 Mg Tabs (Ciprofloxacin hcl) ..... One twice daily  Problem # 4:  DEGENERATIVE JOINT DISEASE, GENERALIZED (ICD-715.00)  Her updated medication list for this problem includes:    Aspirin 81 Mg Tbec (Aspirin) .Marland Kitchen... Take 1 tablet by mouth once a day    Mobic 7.5 Mg Tabs (Meloxicam) .Marland Kitchen... 1 by mouth two times a day    Hydrocodone-acetaminophen 5-500 Mg Tabs (Hydrocodone-acetaminophen) .Marland Kitchen... 1 -2 q4h as needed pain  Her updated medication list for this problem includes:    Aspirin 81 Mg Tbec (Aspirin) .Marland Kitchen... Take 1 tablet by mouth once a day    Mobic 7.5 Mg  Tabs (Meloxicam) .Marland Kitchen... 1 by mouth two times a day    Hydrocodone-acetaminophen 5-500 Mg Tabs (Hydrocodone-acetaminophen) .Marland Kitchen... 1 -2 q4h as needed pain  Problem # 5:  DM (ICD-250.00)  Her updated medication list for this problem includes:    Amaryl 4 Mg Tabs (Glimepiride) .Marland Kitchen... Take 1 tablet by mouth two times a day    Aspirin 81 Mg Tbec (Aspirin) .Marland Kitchen... Take 1 tablet by mouth once a day    Januvia 100 Mg Tabs (Sitagliptin phosphate) ..... One daily    Actos 45 Mg Tabs (Pioglitazone hcl) ..... One daily  Orders: Venipuncture HR:[id number]) TLB-A1C / Hgb A1C (Glycohemoglobin) (83036-A1C)  Her updated medication list for this problem includes:    Amaryl 4 Mg Tabs (Glimepiride) .Marland Kitchen... Take 1 tablet by mouth two times a day    Aspirin 81 Mg Tbec (Aspirin) .Marland Kitchen... Take 1 tablet by mouth once a day    Januvia 100 Mg Tabs (Sitagliptin phosphate) ..... One daily    Actos 45 Mg Tabs (Pioglitazone hcl) ..... One daily  Problem # 6:  HYPERTENSION (ICD-401.9)  Her updated medication list for this problem includes:    Hydrochlorothiazide 12.5 Mg Tabs (Hydrochlorothiazide) .Marland Kitchen... Take 1 tablet by mouth once a  day  Her updated medication list for this problem includes:    Hydrochlorothiazide 12.5 Mg Tabs (Hydrochlorothiazide) .Marland Kitchen... Take 1 tablet by mouth once a day  Complete Medication List: 1)  Daily Multiple Vitamins Tabs (Multiple vitamin) .... Take 1 tablet by mouth once a day 2)  Oscal 500/200 D-3 500-200 Mg-unit Tabs (Calcium-vitamin d) .... One once daily 3)  Zocor 40 Mg Tabs (Simvastatin) .... 1/2 tab once daily 4)  Amaryl 4 Mg Tabs (Glimepiride) .... Take 1 tablet by mouth two times a day 5)  Aspirin 81 Mg Tbec (Aspirin) .... Take 1 tablet by mouth once a day 6)  Hydrochlorothiazide 12.5 Mg Tabs (Hydrochlorothiazide) .... Take 1 tablet by mouth once a day 7)  Flonase 50 Mcg/act Susp (Fluticasone propionate) .... As needed 8)  Albuterol 90 Mcg/act Aers (Albuterol) .... As needed 9)  Mobic 7.5 Mg  Tabs (Meloxicam) .Marland Kitchen.. 1 by mouth two times a day 10)  Accu-chek Aviva Kit (Blood glucose monitoring suppl) .... Test bs qid 11)  Hydrocodone-acetaminophen 5-500 Mg Tabs (Hydrocodone-acetaminophen) .Marland Kitchen.. 1 -2 q4h as needed pain 12)  Cyclobenzaprine Hcl 5 Mg Tabs (Cyclobenzaprine hcl) .... One twice daily as needed for back pain 13)  Januvia 100 Mg Tabs (Sitagliptin phosphate) .... One daily 14)  Actos 45 Mg Tabs (Pioglitazone hcl) .... One daily 15)  Ciprofloxacin Hcl 500 Mg Tabs (Ciprofloxacin hcl) .... One twice daily 16)  Ciprofloxacin Hcl 500 Mg Tabs (Ciprofloxacin hcl) .... One twice daily 17)  Zolpidem Tartrate 5 Mg Tabs (Zolpidem tartrate) .... One at bedtime as needed for sleep  Patient Instructions: 1)  Please schedule a follow-up appointment in 3 months. 2)  Limit your Sodium (Salt). 3)  It is important that you exercise regularly at least 20 minutes 5 times a week. If you develop chest pain, have severe difficulty breathing, or feel very tired , stop exercising immediately and seek medical attention. 4)  You need to lose weight. Consider a lower calorie diet and regular exercise.  5)  Check your blood sugars regularly. If your readings are usually above : or below 70 you should contact our office. 6)  It is important that your Diabetic A1c level is checked every 3 months. 7)  See your eye doctor yearly to check for diabetic eye damage. Prescriptions: ZOLPIDEM TARTRATE 5 MG TABS (ZOLPIDEM TARTRATE) one at bedtime as needed for sleep  #30 x 3   Entered and Authorized by:   Marletta Lor  MD   Signed by:   Marletta Lor  MD on 01/13/2010   Method used:   Print then Give to Patient   RxID:   [id number] HYDROCODONE-ACETAMINOPHEN 5-500 MG TABS (HYDROCODONE-ACETAMINOPHEN) 1 -2 q4h as needed pain  #90 x 3   Entered and Authorized by:   Marletta Lor  MD   Signed by:   Marletta Lor  MD on 01/13/2010   Method used:   Print then Give to Patient   RxID:    [id number] CIPROFLOXACIN HCL 500 MG TABS (CIPROFLOXACIN HCL) one twice daily  #14 x 0   Entered and Authorized by:   Marletta Lor  MD   Signed by:   Marletta Lor  MD on 01/13/2010   Method used:   Electronically to        [facility] (retail)       [place], [place]  [id number]       Ph: [phone]       Fax: [phone]   [id number] JANUVIA 100 MG TABS (SITAGLIPTIN PHOSPHATE) one daily  #90 x 0   Entered and Authorized by:   Marletta Lor  MD   Signed by:   Marletta Lor  MD on 01/13/2010   Method used:   Electronically to        [facility] (retail)       [address]       [place], [place]  [address]       Ph: [phone]       Fax: [phone]   RxID:   [id number] ACCU-CHEK AVIVA   KIT (BLOOD GLUCOSE MONITORING SUPPL) test BS qid  #100 x 12   Entered and Authorized by:   Marletta Lor  MD   Signed by:   Marletta Lor  MD on 01/13/2010   Method used:   Electronically to        [facility] (retail)       [address]       [place], [place]  [address]       Ph: [phone]       Fax: [phone]   [id number] MOBIC 7.5 MG  TABS (MELOXICAM) 1 by mouth two times a day  #90 Each x 5   Entered and Authorized by:   Marletta Lor  MD   Signed by:   Marletta Lor  MD on 01/13/2010   Method used:   Electronically to        [facility] (retail)       [address]       [place], [place]  [address]       Ph: [phone]       Fax: [phone]   [id number] FLONASE 50 MCG/ACT  SUSP (FLUTICASONE PROPIONATE) as needed  #3 x 6   Entered and Authorized by:   Marletta Lor  MD   Signed by:   Marletta Lor  MD on 01/13/2010   Method used:   Electronically to        [facility] (retail)       [address]       [place], [place]  [address]       Ph: [phone]       Fax: [phone]   [id number] HYDROCHLOROTHIAZIDE 12.5 MG  TABS (HYDROCHLOROTHIAZIDE) Take 1 tablet by mouth once a day  #90 Each x 3   Entered and Authorized by:   Marletta Lor  MD   Signed by:   Marletta Lor  MD on 01/13/2010   Method used:   Electronically to        [facility] (retail)       [address]       [place], [place]  [address]       Ph: [phone]       Fax: [phone]   RxID:   [id number] AMARYL 4 MG  TABS (GLIMEPIRIDE) Take 1 tablet by mouth two times a day  #180 x  6   Entered and Authorized by:   Marletta Lor  MD   Signed by:   Marletta Lor  MD on 01/13/2010   Method used:   Electronically to        [facility] (retail)       [address]       [place], [place]  [address]       Ph: [phone]       Fax: [phone]   RxID:   [id number] MG  TABS (SIMVASTATIN) 1/2 tab once daily  #90 x 6   Entered and Authorized by:   Marletta Lor  MD   Signed by:   Marletta Lor  MD on 01/13/2010   Method used:   Electronically to        [facility] (retail)       [address]       [place], [place]  [address]       Ph: [phone]       Fax: [phone]   RxID:   [id number]   Laboratory Results   Urine Tests  Date/Time Received: January 13, 2010 10:24 AM  Date/Time Reported: January 13, 2010 10:24 AM   Routine Urinalysis   Color: straw Appearance: Clear Glucose: negative   (Normal Range: Negative) Bilirubin: negative   (Normal Range: Negative) Ketone: negative   (Normal Range: Negative) Spec. Gravity: 1.015   (Normal Range: 1.003-1.035) Blood: trace-lysed   (Normal Range: Negative) pH: 5.0   (Normal Range: 5.0-8.0) Protein: 100   (Normal Range: Negative) Urobilinogen: 0.2   (Normal Range: 0-1) Nitrite: negative    (Normal Range: Negative) Leukocyte Esterace: negative   (Normal Range: Negative)       Appended Document: effect on left thigh, and the message from on her    Clinical Lists Changes  Observations: Added new observation of COMMENTS2: Doy Hutching, CMA  January 13, 2010 11:38 AM  (01/13/2010 11:38)      Laboratory Results   Blood Tests   Date/Time Recieved: January 13, 2010 11:38 AM  Date/Time Reported: January 13, 2010 11:38 AM   SED rate: 70  Comments: Doy Hutching, CMA  January 13, 2010 11:38 AM     Appended Document: UTI, SOB, mylagias/dm per Dr. Burnice Logan - pt to take predinsone5 mg two times a day x 3days then qam , rov 1 month , repeat lab.  Change to med list - rx to pharmacy - pt aware.KIK

## 2010-08-05 NOTE — Consult Note (Signed)
Summary: Rheumatology-Dr. Hurley Cisco  Rheumatology-Dr. Hurley Cisco   Imported By: Laural Benes 06/02/2010 14:43:27  _____________________________________________________________________  External Attachment:    Type:   Image     Comment:   External Document

## 2010-08-05 NOTE — Progress Notes (Signed)
Summary: chest pains  Phone Note Call from Patient Call back at Home Phone [phone]   Caller: vm Reason for Call: Talk to Nurse, Talk to Doctor Summary of Call: Still having chest pains.  Only time she gets relief is when she takes pain pills.  Also doesn't think her kidneys are cleared up.  Dr. B 4:00.sign  Initial call taken by: Shelbie Hutching, RN,  January 20, 2010 3:05 PM

## 2010-08-05 NOTE — Assessment & Plan Note (Signed)
Summary: leg cramps/dm   Vital Signs:  Patient profile:   77 year old female Weight:      197 pounds Temp:     98.3 degrees F oral BP sitting:   110 / 74  (right arm) Cuff size:   regular  Vitals Entered By: Cay Schillings LPN (October  4, 624THL 2:46 PM) CC: c/o leg and hand cramps , jaw pain - waking at night    [id number] Is Patient Diabetic? Yes Did you bring your meter with you today? No   CC:  c/o leg and hand cramps  and jaw pain - waking at night    fbs148.  History of Present Illness: 77 year old patient who is seen today for follow-up.  She has a history of possible polymyalgia rheumatica, but has not responded well to prednisone therapy.  She continues to have nonspecific diffuse mid abdominal discomfort.  Her main complaint today is cramping involving the legs jaw and hands.  Laboratory studies were done recently with a potassium of 5.1.  His been some modest weight loss.  She has type 2 diabetes with slight worsening of glycemic control with prednisone therapy.  She has been up titrated to 15 mg daily.  She has treated hypertension and dyslipidemia  Allergies (verified): No Known Drug Allergies  Past History:  Past Medical History: Hypertension DM Allergies Hyperlipidemia Low back pain Osteoarthritis suspected polymyalgia rheumatica abdominal pain  Review of Systems       The patient complains of weight loss and abdominal pain.  The patient denies anorexia, fever, weight gain, vision loss, decreased hearing, hoarseness, chest pain, syncope, dyspnea on exertion, peripheral edema, prolonged cough, headaches, hemoptysis, melena, hematochezia, severe indigestion/heartburn, hematuria, incontinence, genital sores, muscle weakness, suspicious skin lesions, transient blindness, difficulty walking, depression, unusual weight change, abnormal bleeding, enlarged lymph nodes, angioedema, and breast masses.    Physical Exam  General:  overweight-appearing.  no distress.   Normal blood pressureoverweight-appearing.   Head:  Normocephalic and atraumatic without obvious abnormalities. No apparent alopecia or balding. Mouth:  Oral mucosa and oropharynx without lesions or exudates.  Teeth in good repair. Neck:  No deformities, masses, or tenderness noted. Lungs:  Normal respiratory effort, chest expands symmetrically. Lungs are clear to auscultation, no crackles or wheezes. Heart:  Normal rate and regular rhythm. S1 and S2 normal without gallop, murmur, click, rub or other extra sounds. Abdomen:  obese soft and nontender.  No organomegaly   Impression & Recommendations:  Problem # 1:  MUSCLE CRAMPS (ICD-729.82) will  discontinue hydrochlorothiazide and put simvastatin on hold  Problem # 2:  ABDOMINAL PAIN (ICD-789.00)  will proceed with CT abdominal scan  Problem # 3:  POLYMYALGIA RHEUMATICA (ICD-725)  will check a sedimentation rate  Complete Medication List: 1)  Daily Multiple Vitamins Tabs (Multiple vitamin) .... Take 1 tablet by mouth once a day 2)  Oscal 500/200 D-3 500-200 Mg-unit Tabs (Calcium-vitamin d) .... One once daily 3)  Zocor 40 Mg Tabs (Simvastatin) .... 1/2 tab once daily 4)  Amaryl 4 Mg Tabs (Glimepiride) .... Take 1 tablet by mouth two times a day 5)  Aspirin 81 Mg Tbec (Aspirin) .... Take 1 tablet by mouth once a day 6)  Hydrochlorothiazide 12.5 Mg Tabs (Hydrochlorothiazide) .... Take 1 tablet by mouth once a day 7)  Flonase 50 Mcg/act Susp (Fluticasone propionate) .... As needed 8)  Albuterol 90 Mcg/act Aers (Albuterol) .... As needed 9)  Mobic 7.5 Mg Tabs (Meloxicam) .Marland Kitchen.. 1 by mouth two times  a day 10)  Accu-chek Aviva Kit (Blood glucose monitoring suppl) .... Test bs qid 11)  Hydrocodone-acetaminophen 5-500 Mg Tabs (Hydrocodone-acetaminophen) .Marland Kitchen.. 1 -2 q4h as needed pain 12)  Cyclobenzaprine Hcl 5 Mg Tabs (Cyclobenzaprine hcl) .... One twice daily as needed for back pain 13)  Januvia 100 Mg Tabs (Sitagliptin phosphate) .... One  daily 14)  Actos 45 Mg Tabs (Pioglitazone hcl) .... One daily 15)  Prednisone 5 Mg Tabs (Prednisone) .... 2 by mouth qam and 1 by mouth qhs 16)  Zolpidem Tartrate 10 Mg Tabs (Zolpidem tartrate) .... One at bedtime  Other Orders: Sedimentation Rate, non-automated [id number]) Radiology Referral (Radiology)  Patient Instructions: 1)  Please schedule a follow-up appointment in 1 month. 2)  Limit your Sodium (Salt) to less than 2 grams a day(slightly less than 1/2 a teaspoon) to prevent fluid retention, swelling, or worsening of symptoms.

## 2010-08-05 NOTE — Miscellaneous (Signed)
Summary: Certification and Plan of Care/[facility]  Certification and Plan of Care/[facility]   Imported By: Laural Benes 07/20/2010 15:13:21  _____________________________________________________________________  External Attachment:    Type:   Image     Comment:   External Document

## 2010-08-05 NOTE — Assessment & Plan Note (Signed)
Summary: hosp fup/cjr  was  Vital Signs:  Patient profile:   77 year old female Weight:      199 pounds Temp:     97.9 degrees F oral BP sitting:   132 / 88  (right arm) Cuff size:   regular  Vitals Entered By: Cay Schillings LPN (December  8, 624THL 11:27 AM) d forCC: post hospital   -   fbs 171 Is Patient Diabetic? Yes Did you bring your meter with you today? No   CC:  post hospital   -   fbs 171.  History of Present Illness: 77 year-old patient who is seen today posthospital discharge.  She was admitted for the acute gastritis.  Hospital records were reviewed.  A head CT without contrast was performed.  It revealed chronic microvascular changes only.  Abdominal ultrasound revealed some fatty infiltration of the liver and some renal cortical atrophy.  Status post cholecystectomy t May 1 C. was 10.0.  AB12 level was low at 186.  Sedimentation rate was 80.  ANA was positive white count was normal at 6.3.  The patient had no anemia.  She was discharged on the Lantus Amaryl metformin discontinued and she is also discharged on Actos, but the patient is fearfulness.  She is not going to be able to tolerate this medication.  Also has safety concerns.  Upper endoscopy revealed significant gastritis.  She is also treated for a UTI  Allergies (verified): No Known Drug Allergies  Past History:  Past Medical History: Hypertension DM Allergies Hyperlipidemia Low back pain Osteoarthritis suspected polymyalgia rheumatica abdominal pain/ history of gastritis, November 2011 B12 deficiency  Review of Systems       The patient complains of anorexia and abdominal pain.  The patient denies fever, weight loss, weight gain, vision loss, decreased hearing, hoarseness, chest pain, syncope, dyspnea on exertion, peripheral edema, prolonged cough, headaches, hemoptysis, melena, hematochezia, severe indigestion/heartburn, hematuria, incontinence, genital sores, muscle weakness, suspicious skin  lesions, transient blindness, difficulty walking, depression, unusual weight change, abnormal bleeding, enlarged lymph nodes, angioedema, and breast masses.    Physical Exam  General:  overweight-appearing.  overweight-appearing.   Head:  Normocephalic and atraumatic without obvious abnormalities. No apparent alopecia or balding. Eyes:  No corneal or conjunctival inflammation noted. EOMI. Perrla. Funduscopic exam benign, without hemorrhages, exudates or papilledema. Vision grossly normal. Mouth:  Oral mucosa and oropharynx without lesions or exudates.  Teeth in good repair. Neck:  No deformities, masses, or tenderness noted. Lungs:  Normal respiratory effort, chest expands symmetrically. Lungs are clear to auscultation, no crackles or wheezes. Heart:  Normal rate and regular rhythm. S1 and S2 normal without gallop, murmur, click, rub or other extra sounds. Abdomen:  Bowel sounds positive,abdomen soft and non-tender without masses, organomegaly or hernias noted. Msk:  No deformity or scoliosis noted of thoracic or lumbar spine.   Extremities:  No clubbing, cyanosis, edema, or deformity noted with normal full range of motion of all joints.     Impression & Recommendations:  Problem # 1:  ABDOMINAL PAIN (ICD-789.00)  Problem # 2:  POLYMYALGIA RHEUMATICA (L6038910)  Problem # 3:  OSTEOARTHRITIS (ICD-715.90)  The following medications were removed from the medication list:    Mobic 7.5 Mg Tabs (Meloxicam) .Marland Kitchen... 1 by mouth two times a day Her updated medication list for this problem includes:    Aspirin 81 Mg Tbec (Aspirin) .Marland Kitchen... Take 1 tablet by mouth once a day    Hydrocodone-acetaminophen 5-500 Mg Tabs (Hydrocodone-acetaminophen) .Marland Kitchen... 1 -  2 q4h as needed pain  The following medications were removed from the medication list:    Mobic 7.5 Mg Tabs (Meloxicam) .Marland Kitchen... 1 by mouth two times a day Her updated medication list for this problem includes:    Aspirin 81 Mg Tbec (Aspirin) .Marland Kitchen... Take 1  tablet by mouth once a day    Hydrocodone-acetaminophen 5-500 Mg Tabs (Hydrocodone-acetaminophen) .Marland Kitchen... 1 -2 q4h as needed pain  Problem # 4:  UTI (ICD-599.0)  Complete Medication List: 1)  Daily Multiple Vitamins Tabs (Multiple vitamin) .... Take 1 tablet by mouth once a day 2)  Oscal 500/200 D-3 500-200 Mg-unit Tabs (Calcium-vitamin d) .... One once daily 3)  Zocor 40 Mg Tabs (Simvastatin) .... 1/2 tab once daily 4)  Aspirin 81 Mg Tbec (Aspirin) .... Take 1 tablet by mouth once a day 5)  Hydrochlorothiazide 12.5 Mg Tabs (Hydrochlorothiazide) .... Take 1 tablet by mouth once a day 6)  Flonase 50 Mcg/act Susp (Fluticasone propionate) .... As needed 7)  Albuterol 90 Mcg/act Aers (Albuterol) .... As needed 8)  Accu-chek Aviva Kit (Blood glucose monitoring suppl) .... Test bs qid 9)  Hydrocodone-acetaminophen 5-500 Mg Tabs (Hydrocodone-acetaminophen) .Marland Kitchen.. 1 -2 q4h as needed pain 10)  Cyclobenzaprine Hcl 5 Mg Tabs (Cyclobenzaprine hcl) .... One twice daily as needed for back pain 11)  Prednisone 5 Mg Tabs (Prednisone) .... 2 by mouth qam and 1 by mouth qhs 12)  Zolpidem Tartrate 10 Mg Tabs (Zolpidem tartrate) .... One at bedtime 13)  Pantoprazole Sodium 40 Mg Tbec (Pantoprazole sodium) .... Take 1 tablet by mouth once a day 30 minutes before breakfast 14)  Lantus Solostar 100 Unit/ml Soln (Insulin glargine) .... 25units qhs 15)  Vitamin B-12 1000 Mcg Tabs (Cyanocobalamin) .... Once daily x14days 16)  Metformin Hcl 500 Mg Xr24h-tab (Metformin hcl) .... One daily  Patient Instructions: 1)  Please schedule a follow-up appointment in 1 month. 2)  Limit your Sodium (Salt) to less than 2 grams a day(slightly less than 1/2 a teaspoon) to prevent fluid retention, swelling, or worsening of symptoms. 3)  It is important that you exercise regularly at least 20 minutes 5 times a week. If you develop chest pain, have severe difficulty breathing, or feel very tired , stop exercising immediately and seek  medical attention. 4)  You need to lose weight. Consider a lower calorie diet and regular exercise.  5)  Check your blood sugars regularly. If your readings are usually above : or below 70 you should contact our office. 6)  It is important that your Diabetic A1c level is checked every 3 months. 7)  See your eye doctor yearly to check for diabetic eye damage. Prescriptions: METFORMIN HCL 500 MG XR24H-TAB (METFORMIN HCL) one daily  #90 x 6   Entered and Authorized by:   Marletta Lor  MD   Signed by:   Marletta Lor  MD on 06/10/2010   Method used:   Electronically to        [facility] (retail)       [address]       [place], [place]  [address]       Ph: [phone]       Fax: [phone]   RxID:   [id number] LANTUS SOLOSTAR 100 UNIT/ML SOLN (INSULIN GLARGINE) 25units qhs  #3 x 3   Entered and Authorized by:   Marletta Lor  MD   Signed by:   Collier Salina  Sherwood Gambler  MD on 06/10/2010   Method used:   Electronically to        [facility] (retail)       [address]       [place], [place]  [address]       Ph: [phone]       Fax: [phone]   RxID:   [id number]    Orders Added: 1)  Est. Patient Level IV [id number]  Appended Document: hosp fup/cjr     Allergies: No Known Drug Allergies   Complete Medication List: 1)  Daily Multiple Vitamins Tabs (Multiple vitamin) .... Take 1 tablet by mouth once a day 2)  Oscal 500/200 D-3 500-200 Mg-unit Tabs (Calcium-vitamin d) .... One once daily 3)  Zocor 40 Mg Tabs (Simvastatin) .... 1/2 tab once daily 4)  Aspirin 81 Mg Tbec (Aspirin) .... Take 1 tablet by mouth once a day 5)  Hydrochlorothiazide 12.5 Mg Tabs (Hydrochlorothiazide) .... Take 1 tablet by mouth once a day 6)  Flonase 50 Mcg/act Susp (Fluticasone propionate) .... As needed 7)  Albuterol 90 Mcg/act Aers (Albuterol) .... As needed 8)  Accu-chek Aviva Kit (Blood glucose monitoring  suppl) .... Test bs qid 9)  Hydrocodone-acetaminophen 5-500 Mg Tabs (Hydrocodone-acetaminophen) .Marland Kitchen.. 1 -2 q4h as needed pain 10)  Cyclobenzaprine Hcl 5 Mg Tabs (Cyclobenzaprine hcl) .... One twice daily as needed for back pain 11)  Prednisone 5 Mg Tabs (Prednisone) .... 2 by mouth qam and 1 by mouth qhs 12)  Zolpidem Tartrate 10 Mg Tabs (Zolpidem tartrate) .... One at bedtime 13)  Pantoprazole Sodium 40 Mg Tbec (Pantoprazole sodium) .... Take 1 tablet by mouth once a day 30 minutes before breakfast 14)  Lantus Solostar 100 Unit/ml Soln (Insulin glargine) .... 25units qhs 15)  Vitamin B-12 1000 Mcg Tabs (Cyanocobalamin) .... Once daily x14days 16)  Metformin Hcl 500 Mg Xr24h-tab (Metformin hcl) .... One daily 17)  Bd Pen Needle Nano U/f 32g X 4 Mm Misc (Insulin pen needle) .... Once daily and prn      Laboratory Results   Urine Tests  Date/Time Received: June 10, 2010 1:06 PM  Date/Time Reported: June 10, 2010 1:06 PM .  Routine Urinalysis   Color: yellow Appearance: Hazy Glucose: 100   (Normal Range: Negative) Bilirubin: negative   (Normal Range: Negative) Ketone: negative   (Normal Range: Negative) Spec. Gravity: >=1.030   (Normal Range: 1.003-1.035) Blood: small   (Normal Range: Negative) pH: 5.0   (Normal Range: 5.0-8.0) Protein: >=300   (Normal Range: Negative) Urobilinogen: 0.2   (Normal Range: 0-1) Nitrite: negative   (Normal Range: Negative) Leukocyte Esterace: negative   (Normal Range: Negative)

## 2010-08-05 NOTE — Procedures (Signed)
Summary: Colonoscopy   Colonoscopy  Procedure date:  09/04/2001  Findings:      Results: Hemorrhoids.     Results: Diverticulosis.       Location:  [facility].    Colonoscopy  Procedure date:  09/04/2001  Findings:      Results: Hemorrhoids.     Results: Diverticulosis.       Location:  [facility].    Patient Name: [name] [name], [name] [name] MRN:  Procedure Procedures: Colorectal cancer screening, average risk CPT: G0121.  Personnel: Endoscopist: Sandy Salaam. Deatra Ina, MD.  Indications Symptoms: Diarrhea Hematochezia.  History  Pre-Exam Physical: Performed Sep 04, 2001. Entire physical exam was normal.  Exam Exam: Extent of exam reached: Cecum, extent intended: Cecum.  The cecum was identified by IC valve. Colon retroflexion performed. ASA Classification: II. Tolerance: excellent.  Monitoring: Pulse and BP monitoring, Oximetry used. Supplemental O2 given. at 2 Liters.  Colon Prep Used Golytely for colon prep. Prep results: good.  Sedation Meds: Fentanyl 100 mcg. given IV. Versed 5 mg. given IV.  Findings - DIVERTICULOSIS: Descending Colon to Sigmoid Colon. ICD9: Diverticulosis: 562.10.  HEMORRHOIDS: ICD9: Hemorrhoids, Internal: 455.0.   Assessment Abnormal examination, see findings above.  Diagnoses: 562.10: Diverticulosis.  455.0: Hemorrhoids, Internal.   Events  Unplanned Interventions: No intervention was required.  Unplanned Events: There were no complications. Plans Patient Education: Patient given standard instructions for: Diverticulosis. Hemorrhoids.  Scheduling/Referral: Follow-Up prn.   cc: Tempie Hoist, MD  This report was created from the original endoscopy report, which was reviewed and signed by the above listed endoscopist.

## 2010-08-05 NOTE — Discharge Summary (Signed)
Summary: Discharge Summary    NAME:  [name] [name] [name], [name] [name] NO.:  [id number]      MEDICAL RECORD NO.:  [id number]          PATIENT TYPE:  INP      LOCATION:  [id number]                         FACILITY:  [facility]      PHYSICIAN:  Romero Belling, MD       DATE OF BIRTH:  1929-04-15      DATE OF ADMISSION:  05/30/2010   DATE OF DISCHARGE:  06/02/2010                            DISCHARGE SUMMARY - REFERRING         PRIMARY CARE PHYSICIAN:  Marletta Lor, MD      PRIMARY RHEUMATOLOGIST:  Lindaann Slough, MD      DISCHARGE DIAGNOSES:   1. Orthostatic hypotension, resolved.   2. Abdominal pain, probably secondary to gastritis, discharged on       proton pump inhibitor.   3. History of urinary tract infection, received antibiotics.   4. History of vitamin B12 deficiency, received vitamin B12 shots in       the hospital and will get 2 weeks of vitamin B12 oral daily.  We       will check the level in 3 weeks.   5. Atypical chest pain with a normal echocardiogram.   6. Type 2 diabetes mellitus, discharged on Lantus.  Januvia was       discontinued.   7. Hypertension, controlled.   8. Insomnia due to poor proper sleep hygiene.      DISCHARGE MEDICATIONS:   1. Vitamin B12 1000 mcg p.o. daily for 14 days.  Check level in 3       weeks.   2. Diabetes Started Kit.   3. Lantus 25 units subcutaneously daily can use SoloStar       pen.   4. Levofloxacin 500 mg p.o. daily for 2 days.   5. Pantoprazole 40 mg p.o. daily with meals.   6. Actos 45 mg p.o. daily.   7. Flexeril 5 mg 1 tablet by mouth every 8 hours as needed for muscle       spasms.   8. Diazepam 2 mg 1 tablet by mouth daily as needed.   9. Hydrochlorothiazide 12.5 mg p.o. daily.   10.Vicodin 1 tablet by mouth every 6 hours as needed for pain.   11.Prednisone 5 mg p.o. daily.   12.Zolpidem 10 mg by mouth daily at bedtime.      DIET:  Carbohydrate modified.      DISPOSITION:  The patient was discharged home.  The  patient will have a   Home Health RN for diabetes teaching.      CONSULTATION:  Gatha Mayer, MD, [facility].      PROCEDURES PERFORMED:  The patient had upper endoscopy, which showed   some gastritis.  The patient had ultrasound of the abdomen, which showed   fatty infiltration of the liver, bilateral cortical renal atrophy,   cholecystectomy, abdominal aorta not well visualized.  The patient had a   chest x-ray, which showed moderate-to-chronic elevation of right   hemidiaphragm.  No acute cardiopulmonary disease.  The patient had  a CT   scan of the head, which showed mild chronic microvascular ischemia, no   acute abnormalities.      FOLLOWUP:  The patient should follow up with Dr. Bluford Kaufmann   within 1 week.  The patient should follow up with Dr. Charlestine Night within 1   week.  The patient should also follow up with Dr. Silvano Rusk in 3 to 4   weeks.  The patient should have vitamin B12 levels checked in about 3   weeks.      CHIEF COMPLAINT:  Dizziness and abdominal pain.      INITIAL HISTORY AND PHYSICAL:  This is a 77 year old female with a past   medical history of hypertension and arthritis, is currently being worked   up for abdominal pain as an outpatient by [facility], that comes in for   dizziness since this morning.  She relates lightheadedness and feeling   kind of drunk.  She went driving to church this morning.  She was seeing   cars coming at her.  She says she could not drive.  Her family noticed   that she has been stumbling around.  When asked about her urine, she   relates some dysuria and foul-smelling urine.  She is not continent at   this time.  She does not have any loose stools or diarrhea.  Black   stools, for which she takes Pepto-Bismol on and off.  She has also been   complaining of some abdominal pain and diarrhea.      HOSPITAL COURSE:   1. Orthostatic hypotension.  The patient was lightheaded as she was       having  orthostatic hypotension, possibly related to urinary tract       infection.  The patient's orthostatics improved, the patient felt       well, and the patient's sensorium resolved.   2. Abdominal pain.  The patient had some continued abdominal pain, for       which she was worked up as an outpatient.  The patient had an EGD,       which showed gastritis.  The patient was discharged on PPI.  The       patient will have an outpatient followup with Dr. Carlean Purl.   3. Urinary tract infection.  The patient had a culture-proven UTI,       which came back greater than 100,000 colonies forming units of       Klebsiella pneumoniae.  The patient received ceftriaxone for 3 days       in the hospital.  The patient will also receive 2 more days of       Levaquin on discharge, the Klebsiella pneumoniae sensitive to       Levaquin.   4. Vitamin B12 deficiency.  The patient had a vitamin B12 level of 186       pg/mL.  The patient had received vitamin B12 IM during the       hospitalization.  The patient will get oral vitamin B12 on       discharge for 14 days.  The patient will need to have the level       checked in 3 weeks.   5. Atypical chest pain.  The patient reports of atypical chest pain.       The patient was initially on telemetry.  The patient's cardiac       enzymes were negative x3.  The patient had a 2-D echocardiogram,  which showed left ventricular cavity size normal, mild focal       basilar hypertrophy of the septum.  Systolic function was normal.       EF was 60% to 65%.   6. Diabetes mellitus type 2.  The patient's Januvia was discontinued       because it was thought the Januvia might be contributing to       abdominal pain.  The patient was discharged home on Lantus 25 units       q.h.s.  The patient can continue Actos on discharge.  The patient       is to have diabetic teaching.   7. Hypertension.  The patient's blood pressure is controlled.   8. Insomnia.  The patient had  insomnia, and the patient had minimal       sleep improvement with Ambien.  The patient should have proper       sleep hygiene.   9. Deep venous thrombosis prophylaxis.  The patient received heparin.               Romero Belling, MD               NH/MEDQ  D:  06/02/2010  T:  06/02/2010  Job:  [id number]      cc:   Marletta Lor, MD   [place] [id number]      Gatha Mayer, MD,FACG   [facility]   [place], [place] [address]      Lindaann Slough, M.D.   [place]   [place] [address]      Electronically Signed by Romero Belling MD on 06/14/2010 11:26:13 PM

## 2010-08-05 NOTE — Procedures (Signed)
Summary: Colonoscopy   Colonoscopy  Procedure date:  09/04/2001  Findings:      Location:  [facility].  Results: Hemorrhoids.     Results: Diverticulosis.        Colonoscopy  Procedure date:  09/04/2001  Findings:      Location:  [facility].  Results: Hemorrhoids.     Results: Diverticulosis.        Patient Name: [name] [name], [name] [name] MRN:  Procedure Procedures: Colorectal cancer screening, average risk CPT: G0121.  Personnel: Endoscopist: Sandy Salaam. Deatra Ina, MD.  Indications Symptoms: Diarrhea Hematochezia.  History  Pre-Exam Physical: Performed Sep 04, 2001. Entire physical exam was normal.  Exam Exam: Extent of exam reached: Cecum, extent intended: Cecum.  The cecum was identified by IC valve. Colon retroflexion performed. ASA Classification: II. Tolerance: excellent.  Monitoring: Pulse and BP monitoring, Oximetry used. Supplemental O2 given. at 2 Liters.  Colon Prep Used Golytely for colon prep. Prep results: good.  Sedation Meds: Fentanyl 100 mcg. given IV. Versed 5 mg. given IV.  Findings - DIVERTICULOSIS: Descending Colon to Sigmoid Colon. ICD9: Diverticulosis: 562.10.  HEMORRHOIDS: ICD9: Hemorrhoids, Internal: 455.0.   Assessment Abnormal examination, see findings above.  Diagnoses: 562.10: Diverticulosis.  455.0: Hemorrhoids, Internal.   Events  Unplanned Interventions: No intervention was required.  Unplanned Events: There were no complications. Plans Patient Education: Patient given standard instructions for: Diverticulosis. Hemorrhoids.  Scheduling/Referral: Follow-Up prn.   This report was created from the original endoscopy report, which was reviewed and signed by the above listed endoscopist.   cc:  Danella Maiers, MD

## 2010-08-09 ENCOUNTER — Telehealth: Payer: Self-pay | Admitting: Internal Medicine

## 2010-08-09 MED ORDER — INSULIN LISPRO 100 UNIT/ML ~~LOC~~ SOLN: SUBCUTANEOUS | Status: DC

## 2010-08-09 NOTE — Telephone Encounter (Signed)
Pt called and was given samples of Humalog quick pen. Pt is req a script to be called in to [facility] on [address].

## 2010-08-10 ENCOUNTER — Encounter: Payer: Self-pay | Admitting: Internal Medicine

## 2010-08-10 ENCOUNTER — Ambulatory Visit (INDEPENDENT_AMBULATORY_CARE_PROVIDER_SITE_OTHER): Payer: Medicare Other | Admitting: Internal Medicine

## 2010-08-10 VITALS — BP 132/80 | Temp 98.0°F | Ht 64.0 in | Wt 188.0 lb

## 2010-08-10 DIAGNOSIS — I10 Essential (primary) hypertension: Secondary | ICD-10-CM

## 2010-08-10 DIAGNOSIS — R21 Rash and other nonspecific skin eruption: Secondary | ICD-10-CM

## 2010-08-10 DIAGNOSIS — E119 Type 2 diabetes mellitus without complications: Secondary | ICD-10-CM

## 2010-08-10 MED ORDER — INSULIN LISPRO 100 UNIT/ML ~~LOC~~ SOLN: SUBCUTANEOUS | Status: DC

## 2010-08-10 MED ORDER — GLUCOSE BLOOD VI STRP: ORAL_STRIP | Status: DC

## 2010-08-10 MED ORDER — LISINOPRIL 20 MG PO TABS: 20.0000 mg | ORAL_TABLET | Freq: Every day | ORAL | Status: DC

## 2010-08-10 NOTE — Progress Notes (Signed)
  Subjective:    Patient ID: [name] [name], female    DOB: October 02, 1928, 77 y.o.   MRN: [id number]  HPI  77 year old patient who is seen today in follow-up for a fairly generalized dermatitis.  She was seen 8 days ago.  Over the past 8 days.  Rest, has worsened somewhat.  It is most marked on the upper extremities legs, and much less marked centrally.  It is not very pruritic.  It is a maculopapular.  She has diabetes which has improved with multiple daily injections    Review of Systems  Constitutional: Negative.   HENT: Negative for hearing loss, congestion, sore throat, rhinorrhea, dental problem, sinus pressure and tinnitus.   Eyes: Negative for pain, discharge and visual disturbance.  Respiratory: Negative for cough and shortness of breath.   Cardiovascular: Negative for chest pain, palpitations and leg swelling.  Gastrointestinal: Negative for nausea, vomiting, abdominal pain, diarrhea, constipation, blood in stool and abdominal distention.  Genitourinary: Negative for dysuria, urgency, frequency, hematuria, flank pain, vaginal bleeding, vaginal discharge, difficulty urinating, vaginal pain and pelvic pain.  Musculoskeletal: Negative for joint swelling, arthralgias and gait problem.  Skin: Positive for rash.  Neurological: Negative for dizziness, syncope, speech difficulty, weakness, numbness and headaches.  Hematological: Negative for adenopathy. Does not bruise/bleed easily.  Psychiatric/Behavioral: Negative for behavioral problems, dysphoric mood and agitation. The patient is not nervous/anxious.        Objective:   Physical Exam  Constitutional: She appears well-nourished. No distress.  HENT:  Mouth/Throat: Oropharynx is clear and moist.  Neck: Normal range of motion.  Cardiovascular: Normal rate, regular rhythm and normal heart sounds.   Pulmonary/Chest: Effort normal. She has no wheezes.  Abdominal: Soft. Bowel sounds are normal. She exhibits no distension. There is no  tenderness.  Skin:       Patient had very generalized maculopapular rash.  This is most marked on the arms and less so on the lower extremities.  She did have some lesions on her anterior upper chest region, but very little else centrally.  No herald patch noted          Assessment & Plan:  Generalized dermatitis.  Etiology remains obscure but the patient is fairly comfortable.  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  No herald patch noted.  Will discontinue hydrochlorothiazide and switch to lisinopril for blood pressure control.  Samples and new prescriptions dispensed

## 2010-08-10 NOTE — Progress Notes (Signed)
Addended by: Cay Schillings on: 08/10/2010 01:55 PM   Modules accepted: Orders

## 2010-08-10 NOTE — Patient Instructions (Signed)
Limit your sodium (Salt) intake  Please check your hemoglobin A1c every 3 months   It is important that you exercise regularly, at least 20 minutes 3 to 4 times per week.  If you develop chest pain or shortness of breath seek  medical attention.  clarinex-one daily Discontinue hydrochlorothiazide

## 2010-08-11 NOTE — Assessment & Plan Note (Signed)
Summary: rash/dm   Vital Signs:  Patient profile:   77 year old female Weight:      190 pounds Temp:     98.1 degrees F oral  Vitals Entered By: Cay Schillings LPN (January 30, [id number] 11:25 AM) CC: rash all over - noted last wk  Is Patient Diabetic? Yes Did you bring your meter with you today? No   Primary Care Provider:  [name], MD  CC:  rash all over - noted last wk .  History of Present Illness: 77 year old patient who has a history of insulin-dependent diabetes.  She was seen one week ago and her mealtime insulin was adjusted.  She was placed on no new medications, however.  Over the past week.  She has had a fairly generalized rash, most marked over the extremities.  It is pruritic.  There is been no fever or other constitutional complaints.  She is on multiple medications, including thiazide diuretics.  No prior history of a similar dermatitis  Allergies: 1)  ! * Januvia 2)  ! * Metformin  Past History:  Past Medical History: Reviewed history from 07/15/2010 and no changes required. Hypertension DM Allergies Hyperlipidemia Low back pain Osteoarthritis suspected polymyalgia rheumatica abdominal pain/ history of gastritis, November 2011 B12 deficiency Diverticulosis Hemorrhoids  Review of Systems       The patient complains of suspicious skin lesions.  The patient denies anorexia, fever, weight loss, weight gain, vision loss, decreased hearing, hoarseness, chest pain, syncope, dyspnea on exertion, peripheral edema, prolonged cough, headaches, hemoptysis, abdominal pain, melena, hematochezia, severe indigestion/heartburn, hematuria, incontinence, genital sores, muscle weakness, transient blindness, difficulty walking, depression, unusual weight change, abnormal bleeding, enlarged lymph nodes, angioedema, and breast masses.    Physical Exam  General:  overweight-appearing.  overweight-appearing.   Skin:  erythematous maculopapular rash, fairly  generalized, but to a much more prominent involving the extremities with minimal dermatitis.  Centrally   Impression & Recommendations:  Problem # 1:  DERMATITIS (ICD-692.9)  will treat with Depo-Medrol, and clinically observed.  Will continue Benadryl  Orders: Depo- Medrol 40mg  (J1030) Admin of Therapeutic Inj  intramuscular or subcutaneous [id number]  Problem # 2:  POLYMYALGIA RHEUMATICA (ICD-725)  Problem # 3:  DM (ICD-250.00)  Her updated medication list for this problem includes:    Aspirin 81 Mg Tbec (Aspirin) .Marland Kitchen... Take 1 tablet by mouth once a day    Lantus Solostar 100 Unit/ml Soln (Insulin glargine) .Marland KitchenMarland KitchenMarland KitchenMarland Kitchen 30 units at bedtime    Humalog Kwikpen 100 Unit/ml Soln (Insulin lispro (human)) .Marland Kitchen... 8 units prior to each meal; add 4 units if bs over 200  Her updated medication list for this problem includes:    Aspirin 81 Mg Tbec (Aspirin) .Marland Kitchen... Take 1 tablet by mouth once a day    Lantus Solostar 100 Unit/ml Soln (Insulin glargine) .Marland KitchenMarland KitchenMarland KitchenMarland Kitchen 30 units at bedtime    Humalog Kwikpen 100 Unit/ml Soln (Insulin lispro (human)) .Marland Kitchen... 8 units prior to each meal; add 4 units if bs over 200  Complete Medication List: 1)  Daily Multiple Vitamins Tabs (Multiple vitamin) .... Take 1 tablet by mouth once a day 2)  Oscal 500/200 D-3 500-200 Mg-unit Tabs (Calcium-vitamin d) .... One once daily 3)  Zocor 40 Mg Tabs (Simvastatin) .... 1/2 tab once daily 4)  Aspirin 81 Mg Tbec (Aspirin) .... Take 1 tablet by mouth once a day 5)  Hydrochlorothiazide 12.5 Mg Tabs (Hydrochlorothiazide) .... Take 1 tablet by mouth once a day 6)  Flonase 50 Mcg/act Susp (Fluticasone  propionate) .... As needed 7)  Albuterol 90 Mcg/act Aers (Albuterol) .... As needed 8)  Accu-chek Aviva Kit (Blood glucose monitoring suppl) .... Test bs qid 9)  Hydrocodone-acetaminophen 5-500 Mg Tabs (Hydrocodone-acetaminophen) .Marland Kitchen.. 1 -2 q4h as needed pain 10)  Zolpidem Tartrate 10 Mg Tabs (Zolpidem tartrate) .... One at bedtime 11)  Lantus  Solostar 100 Unit/ml Soln (Insulin glargine) .... 30 units at bedtime 12)  Vitamin B-12 1000 Mcg Tabs (Cyanocobalamin) .... Once daily x14days 13)  Bd Pen Needle Nano U/f 32g X 4 Mm Misc (Insulin pen needle) .... Once daily and prn 14)  Humalog Kwikpen 100 Unit/ml Soln (Insulin lispro (human)) .... 8 units prior to each meal; add 4 units if bs over 200 15)  Venlafaxine Hcl 75 Mg Xr24h-cap (Venlafaxine hcl) .... One every am 16)  Effexor Xr 75 Mg Xr24h-cap (Venlafaxine hcl) .... Take 1 tablet by mouth once a day  Patient Instructions: 1)  Benadryl 25 mg every 6 hours as needed for itching or rash 2)  call in 48 hours if the rash is not improved   Medication Administration  Injection # 1:    Medication: Depo- Medrol 40mg     Diagnosis: DERMATITIS (ICD-692.9)    Route: IM    Site: L deltoid    Exp Date: 01/2013    Lot #: [id number]    Mfr: Pharmacia    Patient tolerated injection without complications    Given by: Cay Schillings LPN (January 30, [id number] 12:57 PM)  Orders Added: 1)  Est. Patient Level III [id number] 2)  Depo- Medrol 40mg  [J1030] 3)  Admin of Therapeutic Inj  intramuscular or subcutaneous [id number]

## 2010-08-23 ENCOUNTER — Other Ambulatory Visit: Payer: Self-pay

## 2010-08-24 ENCOUNTER — Ambulatory Visit (INDEPENDENT_AMBULATORY_CARE_PROVIDER_SITE_OTHER): Payer: Medicare Other | Admitting: Internal Medicine

## 2010-08-24 ENCOUNTER — Encounter: Payer: Self-pay | Admitting: Internal Medicine

## 2010-08-24 DIAGNOSIS — L259 Unspecified contact dermatitis, unspecified cause: Secondary | ICD-10-CM

## 2010-08-24 DIAGNOSIS — I10 Essential (primary) hypertension: Secondary | ICD-10-CM

## 2010-08-24 DIAGNOSIS — E119 Type 2 diabetes mellitus without complications: Secondary | ICD-10-CM

## 2010-08-24 MED ORDER — INSULIN LISPRO 100 UNIT/ML ~~LOC~~ SOLN: SUBCUTANEOUS | Status: DC

## 2010-08-24 NOTE — Progress Notes (Signed)
  Subjective:    Patient ID: [name] [name], female    DOB: [birth year], 77 y.o.   MRN: [id number]  HPI  77 y/o f/u DM2; still has extensive generalized dermatitis; seen by derm yesterday and biopsy performed;  F/u DM2 today - remains on basal and mealtime insulin    Review of Systems  Constitutional: Negative.   Skin: Positive for rash.       Objective:   Physical Exam  Constitutional: She appears well-developed and well-nourished.       overweight  Skin: Rash noted.       Generalized maculo-papular rash erythematous and scaly          Assessment & Plan:  DM2-  Will check HghA1C Dermatitis- f/u derm

## 2010-08-24 NOTE — Patient Instructions (Signed)
Limit your sodium (Salt) intake   Please check your hemoglobin A1c every 3 months  Return in 3 months for follow-up   

## 2010-08-25 LAB — HEMOGLOBIN A1C: Hgb A1c MFr Bld: 8.8 % — ABNORMAL HIGH (ref 4.6–6.5)

## 2010-08-26 ENCOUNTER — Telehealth: Payer: Self-pay

## 2010-08-26 MED ORDER — INSULIN GLARGINE 100 UNIT/ML ~~LOC~~ SOLN: 36.0000 [IU] | Freq: Every day | SUBCUTANEOUS | Status: DC

## 2010-08-26 NOTE — Progress Notes (Signed)
Quick Note:  Called pt - informed of lab and increase to insulin ______

## 2010-08-26 NOTE — Telephone Encounter (Signed)
Change lantus to 36 units

## 2010-09-05 ENCOUNTER — Other Ambulatory Visit: Payer: Self-pay | Admitting: Internal Medicine

## 2010-09-06 NOTE — Telephone Encounter (Signed)
Called into [facility] - spoke with donnie

## 2010-09-15 LAB — CBC
HCT: 37.6 % (ref 36.0–46.0)
Hemoglobin: 12.2 g/dL (ref 12.0–15.0)
MCH: 32.9 pg (ref 26.0–34.0)
MCV: 94.4 fL (ref 78.0–100.0)
Platelets: 235 10*3/uL (ref 150–400)
Platelets: 246 10*3/uL (ref 150–400)
RBC: 3.7 MIL/uL — ABNORMAL LOW (ref 3.87–5.11)
RDW: 14.5 % (ref 11.5–15.5)
WBC: 6.3 10*3/uL (ref 4.0–10.5)
WBC: 8 10*3/uL (ref 4.0–10.5)

## 2010-09-15 LAB — ANA: Anti Nuclear Antibody(ANA): POSITIVE — AB

## 2010-09-15 LAB — POCT CARDIAC MARKERS
CKMB, poc: 2.2 ng/mL (ref 1.0–8.0)
Myoglobin, poc: 153 ng/mL (ref 12–200)
Troponin i, poc: <0.05 ng/mL (ref 0.00–0.09)

## 2010-09-15 LAB — GLUCOSE, CAPILLARY
Glucose-Capillary: 111 mg/dL — ABNORMAL HIGH (ref 70–99)
Glucose-Capillary: 122 mg/dL — ABNORMAL HIGH (ref 70–99)
Glucose-Capillary: 125 mg/dL — ABNORMAL HIGH (ref 70–99)
Glucose-Capillary: 129 mg/dL — ABNORMAL HIGH (ref 70–99)
Glucose-Capillary: 141 mg/dL — ABNORMAL HIGH (ref 70–99)
Glucose-Capillary: 159 mg/dL — ABNORMAL HIGH (ref 70–99)
Glucose-Capillary: 196 mg/dL — ABNORMAL HIGH (ref 70–99)
Glucose-Capillary: 226 mg/dL — ABNORMAL HIGH (ref 70–99)
Glucose-Capillary: 285 mg/dL — ABNORMAL HIGH (ref 70–99)

## 2010-09-15 LAB — CARDIAC PANEL(CRET KIN+CKTOT+MB+TROPI)
Relative Index: INVALID (ref 0.0–2.5)
Total CK: 71 U/L (ref 7–177)
Troponin I: 0.02 ng/mL (ref 0.00–0.06)

## 2010-09-15 LAB — URINE CULTURE
Colony Count: >=100000
Special Requests: NEGATIVE

## 2010-09-15 LAB — DIFFERENTIAL
Basophils Absolute: 0 10*3/uL (ref 0.0–0.1)
Basophils Relative: 0 % (ref 0–1)
Eosinophils Absolute: 0 10*3/uL (ref 0.0–0.7)
Lymphocytes Relative: 22 % (ref 12–46)
Lymphocytes Relative: 27 % (ref 12–46)
Lymphs Abs: 1.7 10*3/uL (ref 0.7–4.0)
Monocytes Absolute: 0.4 10*3/uL (ref 0.1–1.0)
Monocytes Relative: 5 % (ref 3–12)
Neutro Abs: 5.7 10*3/uL (ref 1.7–7.7)
Neutrophils Relative %: 67 % (ref 43–77)
Neutrophils Relative %: 71 % (ref 43–77)

## 2010-09-15 LAB — METHYLMALONIC ACID, SERUM: Methylmalonic Acid, Quantitative: 140 nmol/L (ref 87–318)

## 2010-09-15 LAB — URINALYSIS, ROUTINE W REFLEX MICROSCOPIC
Bilirubin Urine: NEGATIVE
Ketones, ur: NEGATIVE mg/dL
Nitrite: POSITIVE — AB
Protein, ur: >300 mg/dL — AB
Urobilinogen, UA: 0.2 mg/dL (ref 0.0–1.0)

## 2010-09-15 LAB — BASIC METABOLIC PANEL
BUN: 22 mg/dL (ref 6–23)
Calcium: 9.1 mg/dL (ref 8.4–10.5)
GFR calc non Af Amer: 35 mL/min — ABNORMAL LOW (ref >60)
Potassium: 4.1 mEq/L (ref 3.5–5.1)

## 2010-09-15 LAB — ANTI-NUCLEAR AB-TITER (ANA TITER): ANA Titer 1: NEGATIVE

## 2010-09-15 LAB — SEDIMENTATION RATE: Sed Rate: 80 mm/hr — ABNORMAL HIGH (ref 0–22)

## 2010-09-15 LAB — COMPREHENSIVE METABOLIC PANEL
AST: 32 U/L (ref 0–37)
Albumin: 2.9 g/dL — ABNORMAL LOW (ref 3.5–5.2)
Alkaline Phosphatase: 42 U/L (ref 39–117)
Chloride: 103 mEq/L (ref 96–112)
GFR calc Af Amer: 51 mL/min — ABNORMAL LOW (ref >60)
Potassium: 3.7 mEq/L (ref 3.5–5.1)
Sodium: 138 mEq/L (ref 135–145)
Total Bilirubin: 0.3 mg/dL (ref 0.3–1.2)
Total Protein: 6.7 g/dL (ref 6.0–8.3)

## 2010-09-15 LAB — C-REACTIVE PROTEIN: CRP: 5 mg/dL — ABNORMAL HIGH (ref <0.6)

## 2010-09-15 LAB — TSH: TSH: 0.353 u[IU]/mL (ref 0.350–4.500)

## 2010-09-15 LAB — PROTIME-INR: INR: 1.02 (ref 0.00–1.49)

## 2010-09-15 LAB — URINE MICROSCOPIC-ADD ON

## 2010-09-15 LAB — VITAMIN B12: Vitamin B-12: 186 pg/mL — ABNORMAL LOW (ref 211–911)

## 2010-09-17 ENCOUNTER — Encounter: Payer: Self-pay | Admitting: Internal Medicine

## 2010-09-20 ENCOUNTER — Encounter: Payer: Self-pay | Admitting: Internal Medicine

## 2010-09-20 ENCOUNTER — Ambulatory Visit (INDEPENDENT_AMBULATORY_CARE_PROVIDER_SITE_OTHER): Payer: Medicare Other | Admitting: Internal Medicine

## 2010-09-20 DIAGNOSIS — I10 Essential (primary) hypertension: Secondary | ICD-10-CM

## 2010-09-20 DIAGNOSIS — L259 Unspecified contact dermatitis, unspecified cause: Secondary | ICD-10-CM

## 2010-09-20 DIAGNOSIS — E119 Type 2 diabetes mellitus without complications: Secondary | ICD-10-CM

## 2010-09-20 DIAGNOSIS — E785 Hyperlipidemia, unspecified: Secondary | ICD-10-CM

## 2010-09-20 MED ORDER — INSULIN GLARGINE 100 UNIT/ML ~~LOC~~ SOLN: 40.0000 [IU] | Freq: Every day | SUBCUTANEOUS | Status: DC

## 2010-09-20 NOTE — Progress Notes (Signed)
  Subjective:    Patient ID: [name] [name], female    DOB: [birth year], 77 y.o.   MRN: [id number]  HPI   77 year old patient who is seen today for followup of her type 2 diabetes. 6 weeks ago her Lantus was up titrated to 36 units at bedtime. She is on mealtime sliding scale coverage. Fasting blood sugars have improved. She has been followed closely by dermatology and her dermatitis has also greatly improved. She feels well today. She has treated hypertension and dyslipidemia.    Review of Systems  Constitutional: Negative.   HENT: Negative for hearing loss, congestion, sore throat, rhinorrhea, dental problem, sinus pressure and tinnitus.   Eyes: Negative for pain, discharge and visual disturbance.  Respiratory: Negative for cough and shortness of breath.   Cardiovascular: Negative for chest pain, palpitations and leg swelling.  Gastrointestinal: Negative for nausea, vomiting, abdominal pain, diarrhea, constipation, blood in stool and abdominal distention.  Genitourinary: Negative for dysuria, urgency, frequency, hematuria, flank pain, vaginal bleeding, vaginal discharge, difficulty urinating, vaginal pain and pelvic pain.  Musculoskeletal: Negative for joint swelling, arthralgias and gait problem.  Skin: Negative for rash.  Neurological: Negative for dizziness, syncope, speech difficulty, weakness, numbness and headaches.  Hematological: Negative for adenopathy.  Psychiatric/Behavioral: Negative for behavioral problems, dysphoric mood and agitation. The patient is not nervous/anxious.        Objective:   Physical Exam  Constitutional: She is oriented to person, place, and time. She appears well-developed and well-nourished.  HENT:  Head: Normocephalic.  Right Ear: External ear normal.  Left Ear: External ear normal.  Mouth/Throat: Oropharynx is clear and moist.  Eyes: Conjunctivae and EOM are normal. Pupils are equal, round, and reactive to light.  Neck: Normal range of motion.  Neck supple. No thyromegaly present.  Cardiovascular: Normal rate, regular rhythm, normal heart sounds and intact distal pulses.   Pulmonary/Chest: Effort normal and breath sounds normal.  Abdominal: Soft. Bowel sounds are normal. She exhibits no mass. There is no tenderness.  Musculoskeletal: Normal range of motion.  Lymphadenopathy:    She has no cervical adenopathy.  Neurological: She is alert and oriented to person, place, and time.  Skin: Skin is warm and dry. Rash noted.  Psychiatric: She has a normal mood and affect. Her behavior is normal.          Assessment & Plan:   diabetes improved  Hypertension stable  Dyslipidemia   We'll increase her Lantus further to 40 units at bedtime. She'll continue mealtime insulin with sliding scale coverage. Will recheck in 2 months and check a hemoglobin A1c at that time. Her blood pressure medication and statin therapy will be unchanged

## 2010-09-20 NOTE — Patient Instructions (Signed)
Return in 3 months for follow-up   Please check your hemoglobin A1c every 3 months    It is important that you exercise regularly, at least 20 minutes 3 to 4 times per week.  If you develop chest pain or shortness of breath seek  medical attention.

## 2010-09-24 ENCOUNTER — Encounter: Payer: Self-pay | Admitting: Internal Medicine

## 2010-10-28 ENCOUNTER — Telehealth: Payer: Self-pay | Admitting: Internal Medicine

## 2010-10-28 NOTE — Telephone Encounter (Signed)
Shingle rx faxed to [facility] . KIK

## 2010-10-28 NOTE — Telephone Encounter (Signed)
Pt called and said that she is at [facility] on [address], and needs to get a script faxed over to pharmacy, in order to get shingles vaccine. Pt misplaced original script. Maudie Mercury, Dr Despina Pole' nurse, if faxing over new script to pharmacy.

## 2010-11-19 ENCOUNTER — Encounter: Payer: Self-pay | Admitting: Internal Medicine

## 2010-11-19 ENCOUNTER — Ambulatory Visit (INDEPENDENT_AMBULATORY_CARE_PROVIDER_SITE_OTHER): Payer: Medicare Other | Admitting: Internal Medicine

## 2010-11-19 DIAGNOSIS — E119 Type 2 diabetes mellitus without complications: Secondary | ICD-10-CM

## 2010-11-19 DIAGNOSIS — M1990 Unspecified osteoarthritis, unspecified site: Secondary | ICD-10-CM

## 2010-11-19 DIAGNOSIS — I10 Essential (primary) hypertension: Secondary | ICD-10-CM

## 2010-11-19 DIAGNOSIS — J069 Acute upper respiratory infection, unspecified: Secondary | ICD-10-CM

## 2010-11-19 NOTE — Patient Instructions (Signed)
Get plenty of rest, Drink lots of  clear liquids, and use Tylenol or ibuprofen for fever and discomfort.    Call or return to clinic prn if these symptoms worsen or fail to improve as anticipated.  

## 2010-11-19 NOTE — Progress Notes (Signed)
  Subjective:    Patient ID: [name] [name], female    DOB: January 17, 1929, 77 y.o.   MRN: [id number]  HPI  77 year old patient who presents complaining of head and chest congestion and headache there's been no fever or productive cough she has a general sense of unwellness blood sugars have been elevated. She also complains of left shoulder discomfort. She does have a history of osteoarthritis    Review of Systems  Constitutional: Positive for fatigue.  HENT: Positive for congestion. Negative for hearing loss, sore throat, rhinorrhea, dental problem, sinus pressure and tinnitus.   Eyes: Negative for pain, discharge and visual disturbance.  Respiratory: Positive for cough. Negative for shortness of breath.   Cardiovascular: Negative for chest pain, palpitations and leg swelling.  Gastrointestinal: Negative for nausea, vomiting, abdominal pain, diarrhea, constipation, blood in stool and abdominal distention.  Genitourinary: Negative for dysuria, urgency, frequency, hematuria, flank pain, vaginal bleeding, vaginal discharge, difficulty urinating, vaginal pain and pelvic pain.  Musculoskeletal: Negative for joint swelling, arthralgias and gait problem.  Skin: Negative for rash.  Neurological: Positive for weakness, light-headedness and headaches. Negative for dizziness, syncope, speech difficulty and numbness.  Hematological: Negative for adenopathy.  Psychiatric/Behavioral: Negative for behavioral problems, dysphoric mood and agitation. The patient is not nervous/anxious.        Objective:   Physical Exam  Constitutional: She is oriented to person, place, and time. She appears well-developed and well-nourished. No distress.       Afebrile. No distress  HENT:  Head: Normocephalic.  Right Ear: External ear normal.  Left Ear: External ear normal.  Mouth/Throat: Oropharynx is clear and moist.  Eyes: Conjunctivae and EOM are normal. Pupils are equal, round, and reactive to light.  Neck:  Normal range of motion. Neck supple. No thyromegaly present.  Cardiovascular: Normal rate, regular rhythm, normal heart sounds and intact distal pulses.   Pulmonary/Chest: Effort normal and breath sounds normal.  Abdominal: Soft. Bowel sounds are normal. She exhibits no mass. There is no tenderness.  Musculoskeletal: Normal range of motion.  Lymphadenopathy:    She has no cervical adenopathy.  Neurological: She is alert and oriented to person, place, and time.  Skin: Skin is warm and dry. No rash noted.  Psychiatric: She has a normal mood and affect. Her behavior is normal.          Assessment & Plan:   URI. Will treat symptomatically Diabetes mellitus. We'll continue mealtime and basal insulin. Hypertension stable

## 2010-11-23 ENCOUNTER — Other Ambulatory Visit: Payer: Self-pay | Admitting: Internal Medicine

## 2010-11-24 NOTE — Telephone Encounter (Signed)
#  [facility]

## 2010-11-24 NOTE — Telephone Encounter (Signed)
Please advise - last seen 08/02/10    last written 05/10/10 #90 3RF

## 2010-11-26 ENCOUNTER — Telehealth: Payer: Self-pay | Admitting: Internal Medicine

## 2010-11-26 NOTE — Telephone Encounter (Signed)
Spoke with pt - informed she can pick up Tuesday , due to holiday. KIK

## 2010-11-26 NOTE — Telephone Encounter (Signed)
Pts handicap placard is going to expire at end of this month. Pt is wondering what she would need to do to get renewal? Pls advise.

## 2010-11-30 NOTE — Telephone Encounter (Signed)
done

## 2010-12-06 ENCOUNTER — Telehealth: Payer: Self-pay | Admitting: *Deleted

## 2010-12-06 NOTE — Telephone Encounter (Signed)
ok 

## 2010-12-06 NOTE — Telephone Encounter (Addendum)
Pt's son would like Mom referred to Dr. Bubba Camp at [facility].  May leave message on his phone.  Spoke to pt and informed her we will call when the referral is made.

## 2010-12-20 ENCOUNTER — Encounter: Payer: Self-pay | Admitting: Internal Medicine

## 2010-12-20 ENCOUNTER — Ambulatory Visit (INDEPENDENT_AMBULATORY_CARE_PROVIDER_SITE_OTHER): Payer: Medicare Other | Admitting: Internal Medicine

## 2010-12-20 DIAGNOSIS — E7800 Pure hypercholesterolemia, unspecified: Secondary | ICD-10-CM

## 2010-12-20 DIAGNOSIS — E119 Type 2 diabetes mellitus without complications: Secondary | ICD-10-CM

## 2010-12-20 DIAGNOSIS — E785 Hyperlipidemia, unspecified: Secondary | ICD-10-CM

## 2010-12-20 DIAGNOSIS — E538 Deficiency of other specified B group vitamins: Secondary | ICD-10-CM

## 2010-12-20 MED ORDER — INSULIN LISPRO 100 UNIT/ML ~~LOC~~ SOLN: SUBCUTANEOUS | Status: DC

## 2010-12-20 MED ORDER — INSULIN GLARGINE 100 UNIT/ML ~~LOC~~ SOLN: SUBCUTANEOUS | Status: DC

## 2010-12-20 NOTE — Patient Instructions (Signed)
Please check your hemoglobin A1c every 3 months    It is important that you exercise regularly, at least 20 minutes 3 to 4 times per week.  If you develop chest pain or shortness of breath seek  medical attention.  You need to lose weight.  Consider a lower calorie diet and regular exercise.  Return in 3 months for follow-up  Resume vitamin B12 1 mg daily

## 2010-12-20 NOTE — Progress Notes (Signed)
  Subjective:    Patient ID: [name] [name], female    DOB: 05/13/1929, 77 y.o.   MRN: [id number]  HPI   A 77 year old patient who is seen today for followup for her type 2 diabetes. Blood sugars remained elevated she is on Lantus insulin as well as mealtime insulin. Fasting blood sugars are really less than 200 she is on 40 units of Lantus daily. Last hemoglobin  A1c 8.8. She has a history of B12 deficiency but is no longer taking oral supplements. She misunderstood her rheumatologist recommendation and discontinued vitamin B12 as well as prednisone. She was evaluated for possible polymyalgia rheumatica which was ruled out. She does have hypertension and dyslipidemia. She has had occasional falls but no significant injury   Review of Systems  Constitutional: Negative.   HENT: Negative for hearing loss, congestion, sore throat, rhinorrhea, dental problem, sinus pressure and tinnitus.   Eyes: Negative for pain, discharge and visual disturbance.  Respiratory: Negative for cough and shortness of breath.   Cardiovascular: Negative for chest pain, palpitations and leg swelling.  Gastrointestinal: Negative for nausea, vomiting, abdominal pain, diarrhea, constipation, blood in stool and abdominal distention.  Genitourinary: Negative for dysuria, urgency, frequency, hematuria, flank pain, vaginal bleeding, vaginal discharge, difficulty urinating, vaginal pain and pelvic pain.  Musculoskeletal: Positive for gait problem. Negative for joint swelling and arthralgias.  Skin: Negative for rash.  Neurological: Negative for dizziness, syncope, speech difficulty, weakness, numbness and headaches.  Hematological: Negative for adenopathy.  Psychiatric/Behavioral: Negative for behavioral problems, dysphoric mood and agitation. The patient is not nervous/anxious.        Objective:   Physical Exam  Constitutional: She is oriented to person, place, and time. She appears well-developed and well-nourished.    Obese. Normal blood pressure  HENT:  Head: Normocephalic.  Right Ear: External ear normal.  Left Ear: External ear normal.  Mouth/Throat: Oropharynx is clear and moist.  Eyes: Conjunctivae and EOM are normal. Pupils are equal, round, and reactive to light.  Neck: Normal range of motion. Neck supple. No thyromegaly present.  Cardiovascular: Normal rate, regular rhythm, normal heart sounds and intact distal pulses.   Pulmonary/Chest: Effort normal and breath sounds normal.  Abdominal: Soft. Bowel sounds are normal. She exhibits no mass. There is no tenderness.  Musculoskeletal: Normal range of motion.  Lymphadenopathy:    She has no cervical adenopathy.  Neurological: She is alert and oriented to person, place, and time.  Skin: Skin is warm and dry. No rash noted.       Resolving ecchymoses over her hands and arms  Psychiatric: She has a normal mood and affect. Her behavior is normal.          Assessment & Plan:   Diabetes mellitus. Suboptimal control. We'll check a hemoglobin A1c and up titrate both basal and mealtime insulin Hypertension stable Dyslipidemia B12 deficiency we'll restart oral B12. A B12 level rechecked

## 2010-12-22 ENCOUNTER — Telehealth: Payer: Self-pay | Admitting: Internal Medicine

## 2010-12-22 NOTE — Telephone Encounter (Signed)
Pt's son called in [name]) 6/20. Requesting Valium or Xanax for help sleeping. Pt is currently on Ambien for that, but son feels it is causing her to be confused and fall a lot. Please contact her directly, per son.

## 2010-12-23 NOTE — Telephone Encounter (Signed)
Spoke with pt - informed of call from son and dr. Truddie Hidden instruction to hold [place] and use tylenol pm- call if any problems

## 2010-12-23 NOTE — Telephone Encounter (Signed)
Discontinue Ambien Try Tylenol PM

## 2010-12-29 ENCOUNTER — Other Ambulatory Visit: Payer: Self-pay | Admitting: Internal Medicine

## 2010-12-30 ENCOUNTER — Telehealth: Payer: Self-pay | Admitting: Internal Medicine

## 2010-12-30 ENCOUNTER — Ambulatory Visit (INDEPENDENT_AMBULATORY_CARE_PROVIDER_SITE_OTHER): Payer: Medicare Other | Admitting: Internal Medicine

## 2010-12-30 ENCOUNTER — Encounter: Payer: Self-pay | Admitting: Internal Medicine

## 2010-12-30 DIAGNOSIS — I10 Essential (primary) hypertension: Secondary | ICD-10-CM

## 2010-12-30 DIAGNOSIS — M549 Dorsalgia, unspecified: Secondary | ICD-10-CM

## 2010-12-30 DIAGNOSIS — E119 Type 2 diabetes mellitus without complications: Secondary | ICD-10-CM

## 2010-12-30 MED ORDER — INSULIN GLARGINE 100 UNIT/ML ~~LOC~~ SOLN: SUBCUTANEOUS | Status: DC

## 2010-12-30 MED ORDER — HYDROCODONE-ACETAMINOPHEN 5-500 MG PO TABS: 5.0000 | ORAL_TABLET | Freq: Four times a day (QID) | ORAL | Status: DC | PRN

## 2010-12-30 MED ORDER — INSULIN LISPRO 100 UNIT/ML ~~LOC~~ SOLN: SUBCUTANEOUS | Status: DC

## 2010-12-30 NOTE — Progress Notes (Signed)
  Subjective:    Patient ID: [name] [name], female    DOB: 1928-11-05, 77 y.o.   MRN: [id number]  HPI44 year old patient who fell and sustained trauma 12 days ago. She is complaining of persistent low back and right hip discomfort. Pain is aggravated by movement but she is ambulatory pain is primarily in the lumbar region of the back. Blood sugars were reviewed blood sugars are consistently elevated 1 fasting blood sugar was 143 but she generally runs in excess of 200. Her insulin therapy will be adjusted    Review of Systems  Musculoskeletal: Positive for back pain and gait problem.       Objective:   Physical Exam  Constitutional: She appears well-developed and well-nourished. No distress.       Obese and ambulatory; able to transfer from a sitting position to the examining table unassisted  Musculoskeletal:       There was no lumbar tenderness to palpation. Straight leg testing negative there was full range of motion of the right hip without pain.          Assessment & Plan:   Traumatic low back pain. Will refill her Vicodin which she has used in the past Diabetes mellitus. Poor control. We'll uptitrate her insulin

## 2010-12-30 NOTE — Telephone Encounter (Signed)
90

## 2010-12-30 NOTE — Telephone Encounter (Signed)
Appt made to see Dr. Raliegh Ip today.

## 2010-12-30 NOTE — Telephone Encounter (Signed)
Called into [facility]

## 2010-12-30 NOTE — Telephone Encounter (Signed)
Written 11/23/10 #90 0RF - please advise Last seen 12/20/10

## 2010-12-30 NOTE — Telephone Encounter (Signed)
Narcotic refill

## 2010-12-30 NOTE — Telephone Encounter (Signed)
Pt having hip and back pain. Pt is req a call from Dr Burnice Logan or nurse re: the pain. Pt req referral or a med called in. [facility] on [address].

## 2010-12-30 NOTE — Patient Instructions (Signed)
Most patients with low back pain will improve with time over the next two to 6 weeks.  Keep active but avoid any activities that cause pain.  Apply moist heat to the low back area several times daily. 

## 2011-01-04 ENCOUNTER — Telehealth: Payer: Self-pay

## 2011-01-04 MED ORDER — INSULIN LISPRO 100 UNIT/ML ~~LOC~~ SOLN: 12.0000 [IU] | Freq: Three times a day (TID) | SUBCUTANEOUS | Status: DC

## 2011-01-04 NOTE — Telephone Encounter (Signed)
Change humalog to [employer]

## 2011-01-07 ENCOUNTER — Other Ambulatory Visit: Payer: Self-pay

## 2011-01-07 MED ORDER — INSULIN LISPRO 100 UNIT/ML ~~LOC~~ SOLN: 16.0000 [IU] | Freq: Three times a day (TID) | SUBCUTANEOUS | Status: DC

## 2011-01-07 NOTE — Telephone Encounter (Signed)
Correction in units with each meal - was 12 units - is now 16units

## 2011-01-26 ENCOUNTER — Telehealth: Payer: Self-pay

## 2011-01-26 MED ORDER — TRAZODONE HCL 100 MG PO TABS: 100.0000 mg | ORAL_TABLET | Freq: Every day | ORAL | Status: DC

## 2011-01-26 NOTE — Telephone Encounter (Signed)
Rx sent to pharmacy and pt notified  ?

## 2011-01-26 NOTE — Telephone Encounter (Signed)
trazadone 100 mg  #30 one at bedtime

## 2011-01-26 NOTE — Telephone Encounter (Signed)
Pt c/o unable to sleep. She states that the [place] drove her "up the wall" so Dr. Mel Almond took her off the [place]. She said Dr. Raliegh Ip has to "fix" her because she can't live staying awake all the time. Offered pt an appointment but she declined unless Dr. Raliegh Ip says it's necessary. Please advise

## 2011-02-15 ENCOUNTER — Other Ambulatory Visit: Payer: Self-pay | Admitting: Internal Medicine

## 2011-02-15 ENCOUNTER — Other Ambulatory Visit: Payer: Self-pay

## 2011-02-15 MED ORDER — INSULIN GLARGINE 100 UNIT/ML ~~LOC~~ SOLN: 56.0000 [IU] | Freq: Every day | SUBCUTANEOUS | Status: DC

## 2011-02-15 NOTE — Telephone Encounter (Signed)
Refilled to [facility]

## 2011-02-16 ENCOUNTER — Other Ambulatory Visit: Payer: Self-pay

## 2011-02-16 MED ORDER — INSULIN GLARGINE 100 UNIT/ML ~~LOC~~ SOLN: 56.0000 [IU] | Freq: Every day | SUBCUTANEOUS | Status: DC

## 2011-02-16 NOTE — Telephone Encounter (Signed)
Change lantus from vial to solostar pens per pt request

## 2011-02-21 ENCOUNTER — Other Ambulatory Visit: Payer: Self-pay

## 2011-02-21 MED ORDER — INSULIN GLARGINE 100 UNIT/ML ~~LOC~~ SOLN: 56.0000 [IU] | Freq: Every day | SUBCUTANEOUS | Status: DC

## 2011-02-21 NOTE — Telephone Encounter (Signed)
Pt request pens - not vials - new rx sent to [place]. KIK

## 2011-03-02 ENCOUNTER — Ambulatory Visit: Payer: Medicare Other | Admitting: Dietician

## 2011-03-05 ENCOUNTER — Other Ambulatory Visit: Payer: Self-pay | Admitting: Internal Medicine

## 2011-03-08 NOTE — Telephone Encounter (Signed)
Faxed back to [facility] - line rings busy -

## 2011-03-08 NOTE — Telephone Encounter (Signed)
ok 

## 2011-03-08 NOTE — Telephone Encounter (Signed)
Please advise  Last seen 12/30/10   Last written 12/30/10 #90 2RF

## 2011-03-16 ENCOUNTER — Other Ambulatory Visit: Payer: Self-pay | Admitting: Internal Medicine

## 2011-03-16 MED ORDER — ALBUTEROL 90 MCG/ACT IN AERS: 2.0000 | INHALATION_SPRAY | Freq: Four times a day (QID) | RESPIRATORY_TRACT | Status: DC | PRN

## 2011-03-16 MED ORDER — SIMVASTATIN 40 MG PO TABS: 20.0000 mg | ORAL_TABLET | Freq: Every day | ORAL | Status: DC

## 2011-03-16 MED ORDER — TRAZODONE HCL 100 MG PO TABS: 100.0000 mg | ORAL_TABLET | Freq: Every day | ORAL | Status: DC

## 2011-03-16 MED ORDER — FLUOCINONIDE 0.05 % EX CREA: TOPICAL_CREAM | Freq: Two times a day (BID) | CUTANEOUS | Status: DC

## 2011-03-16 MED ORDER — FLUTICASONE PROPIONATE 50 MCG/ACT NA SUSP: 1.0000 | Freq: Every day | NASAL | Status: DC

## 2011-03-16 MED ORDER — LISINOPRIL 20 MG PO TABS: 20.0000 mg | ORAL_TABLET | Freq: Every day | ORAL | Status: DC

## 2011-03-16 MED ORDER — HYDROCHLOROTHIAZIDE 12.5 MG PO CAPS: 12.5000 mg | ORAL_CAPSULE | ORAL | Status: DC

## 2011-03-16 MED ORDER — INSULIN LISPRO 100 UNIT/ML ~~LOC~~ SOLN: 45.0000 [IU] | Freq: Three times a day (TID) | SUBCUTANEOUS | Status: DC

## 2011-03-16 MED ORDER — INSULIN GLARGINE 100 UNIT/ML ~~LOC~~ SOLN: 56.0000 [IU] | Freq: Every day | SUBCUTANEOUS | Status: DC

## 2011-03-16 MED ORDER — VENLAFAXINE HCL ER 75 MG PO CP24: 75.0000 mg | ORAL_CAPSULE | Freq: Every day | ORAL | Status: DC

## 2011-03-16 NOTE — Telephone Encounter (Signed)
Attempt to call- VM - LMTCB if problems - 10 med re ordered to [facility]- 90 day 3 RF done. Controlled substances not done. KIK

## 2011-03-16 NOTE — Telephone Encounter (Signed)
Patient would like medications ordered at the same time - 90 day supply so it is easier for her to pick up.  Her pharmacy is [facility].  Patient is also wanting a flu shot.-

## 2011-03-25 ENCOUNTER — Encounter: Payer: Medicare Other | Attending: Endocrinology | Admitting: Dietician

## 2011-03-25 ENCOUNTER — Encounter: Payer: Self-pay | Admitting: Dietician

## 2011-03-25 DIAGNOSIS — E119 Type 2 diabetes mellitus without complications: Secondary | ICD-10-CM | POA: Insufficient documentation

## 2011-03-25 DIAGNOSIS — Z713 Dietary counseling and surveillance: Secondary | ICD-10-CM | POA: Insufficient documentation

## 2011-03-25 NOTE — Patient Instructions (Signed)
   Take the Amaryl 4 mg before meals.  Especially the evening meal.  Take the the Metformin with food or after the meal.  Continue to check glucose and to keep a record and take to MD visit.  Walk, aim 150 minutes per week.    Don't skip meals.  Eat a snack.  At every meal and snack have some protein.  Use the snack suggestion list,  Check your feet every day and continue to use your lotion like you are doing.  Fiber in products:  Bread= 2 gm per serving and Cereal= 3 gm or more.  Try to keep the carbohydrate servings/portions to 2 per meal and at snacks keep the carbohydrate to 1 serving plus a protein at each snack.

## 2011-03-25 NOTE — Progress Notes (Signed)
  Medical Nutrition Therapy:  Appt start time: 1030 end time:  1200.  Assessment:  Primary concerns today: Blood glucose levels unstable and HgAlC she believes is "tto high".  Has been on Humalog insulin but this was D/C and she feels she is doing better and wants to continue to take care of herself and learn what to eat.  Had a number of questions.  Reported a recent low glucose of 74 a couple of days ago.  Did not have the S/S associated with a lower blood glucose.  This was her reading on checking her fasting.  She is concerned that this is too low for her as she lives alone.     MEDICATIONS: For diabetes taking:  Lantus 56 units at HS, Metformin 500 mg post meal in the AM and PM, Amaryl 4 mg taken post meal in the AM and PM.  I ask that she try to take the Amaryl 30 minutes before the morning and evening meals for best coverage of the glucose curve.  She can continue to take the Metformin following the morning and evening meals.  DIETARY INTAKE:  24-hr recall:  B (9:00 AM): Bagel and cream cheese, water.  Or fruit and a glass of milk. Or 2 slices of [employer] toast .  Snk ( AM) :Generally, no AM snack.  If so, a piece of fruit.  L (123456 PM):  2 slices of ww bread and chicken salad with water or milk. Or meal at [place] of meat, non-starchy vegetable, fruit and +/- milk or water or diet green tea  Snk (Mid  PM): Piece of fruit or PB crackers D (6:00-8:00 PM): Stewed tomato, 3 slices of crisp bacon with 1/2 cup potatoes and a small piece of corn bread.  The cornbread was fried.  Snk (late  PM):  Korin Setzler or Alin Chavira not have an evening snack depends of blood glucose level.  Recent physical activity: Not consistent in walking.  Doing some walking in [employer] where the temperature is more stable  Estimated energy needs:Notes that she has had a decrease in her appetite  Calorie needs at about 1300. 140-145 g carbohydrates 95-100 g protein 34-46 g fat  Progress Towards Goal(s):  In progress.   Nutritional  Diagnosis:  [place]-2.1 Inpaired nutrition utilization As related to Glucose.  As evidenced by Diagnosis of DN type 2 and irregular blood glucose levels..    Intervention:  Nutrition Review of nutritional needs and suggested that she continue to limit carbohydrate portions and to avoid too many fatty foods and frying.  Handouts given during visit include:  ADA "Living with Diabetes"  30 gm Carbohydrate Menu  Snack list  Day to Day Self-Care Guide  Monitoring/Evaluation:  Dietary intake, exercise, blood glucose levels and body weight to call for follow-up and/or questions.

## 2011-04-07 ENCOUNTER — Ambulatory Visit (INDEPENDENT_AMBULATORY_CARE_PROVIDER_SITE_OTHER): Payer: Medicare Other | Admitting: Internal Medicine

## 2011-04-07 DIAGNOSIS — Z0000 Encounter for general adult medical examination without abnormal findings: Secondary | ICD-10-CM

## 2011-04-07 DIAGNOSIS — Z23 Encounter for immunization: Secondary | ICD-10-CM

## 2011-05-12 ENCOUNTER — Other Ambulatory Visit: Payer: Self-pay | Admitting: Internal Medicine

## 2011-07-27 ENCOUNTER — Other Ambulatory Visit: Payer: Self-pay

## 2011-07-27 NOTE — Telephone Encounter (Signed)
Error

## 2011-08-02 ENCOUNTER — Encounter: Payer: Self-pay | Admitting: Internal Medicine

## 2011-08-02 ENCOUNTER — Ambulatory Visit (INDEPENDENT_AMBULATORY_CARE_PROVIDER_SITE_OTHER): Payer: Medicare Other | Admitting: Internal Medicine

## 2011-08-02 DIAGNOSIS — I10 Essential (primary) hypertension: Secondary | ICD-10-CM

## 2011-08-02 DIAGNOSIS — M545 Low back pain, unspecified: Secondary | ICD-10-CM

## 2011-08-02 DIAGNOSIS — E119 Type 2 diabetes mellitus without complications: Secondary | ICD-10-CM

## 2011-08-02 MED ORDER — VENLAFAXINE HCL ER 75 MG PO CP24: 75.0000 mg | ORAL_CAPSULE | Freq: Every day | ORAL | Status: DC

## 2011-08-02 NOTE — Patient Instructions (Signed)
Limit your sodium (Salt) intake    It is important that you exercise regularly, at least 20 minutes 3 to 4 times per week.  If you develop chest pain or shortness of breath seek  medical attention.   Please check your hemoglobin A1c every 3 months  Return in 6 months for follow-up

## 2011-08-02 NOTE — Progress Notes (Signed)
  Subjective:    Patient ID: [name] [name], female    DOB: Dec 02, 1928, 78 y.o.   MRN: [id number]  HPI  78 year old patient who is seen today for followup. She's not been seen here in a number of months and apparently is seeing endocrinology for followup of her diabetes. She remains on Lantus 56 units at bedtime at mealtime insulin has been discontinued. She apparently is on a sulfonylurea and metformin both twice a day. She has treated hypertension. She is doing quite well. She has osteoarthritis which has been stable. She has dyslipidemia which has been controlled on simvastatin.    Review of Systems  Constitutional: Negative.   HENT: Negative for hearing loss, congestion, sore throat, rhinorrhea, dental problem, sinus pressure and tinnitus.   Eyes: Negative for pain, discharge and visual disturbance.  Respiratory: Negative for cough and shortness of breath.   Cardiovascular: Negative for chest pain, palpitations and leg swelling.  Gastrointestinal: Negative for nausea, vomiting, abdominal pain, diarrhea, constipation, blood in stool and abdominal distention.  Genitourinary: Negative for dysuria, urgency, frequency, hematuria, flank pain, vaginal bleeding, vaginal discharge, difficulty urinating, vaginal pain and pelvic pain.  Musculoskeletal: Negative for joint swelling, arthralgias and gait problem.  Skin: Negative for rash.  Neurological: Negative for dizziness, syncope, speech difficulty, weakness, numbness and headaches.  Hematological: Negative for adenopathy.  Psychiatric/Behavioral: Negative for behavioral problems, dysphoric mood and agitation. The patient is not nervous/anxious.        Objective:   Physical Exam  Constitutional: She is oriented to person, place, and time. She appears well-developed and well-nourished.  HENT:  Head: Normocephalic.  Right Ear: External ear normal.  Left Ear: External ear normal.  Mouth/Throat: Oropharynx is clear and moist.  Eyes:  Conjunctivae and EOM are normal. Pupils are equal, round, and reactive to light.  Neck: Normal range of motion. Neck supple. No thyromegaly present.  Cardiovascular: Normal rate, regular rhythm, normal heart sounds and intact distal pulses.   Pulmonary/Chest: Effort normal and breath sounds normal.  Abdominal: Soft. Bowel sounds are normal. She exhibits no mass. There is no tenderness.  Musculoskeletal: Normal range of motion.  Lymphadenopathy:    She has no cervical adenopathy.  Neurological: She is alert and oriented to person, place, and time.  Skin: Skin is warm and dry. No rash noted.  Psychiatric: She has a normal mood and affect. Her behavior is normal.          Assessment & Plan:   Diabetes mellitus. Per endocrinology Hypertension well controlled. We'll continue present regimen weight loss low salt diet recommended Dyslipidemia. Return in 6 months for her annual physical. Lipid panel checked at that time Osteoarthritis- stable

## 2011-09-05 ENCOUNTER — Other Ambulatory Visit: Payer: Self-pay | Admitting: Internal Medicine

## 2011-09-05 NOTE — Telephone Encounter (Signed)
Ok  6 oz

## 2011-09-05 NOTE — Telephone Encounter (Signed)
Pt. Wants refill on cough syrup that Dr. Raliegh Ip had previously prescribed for her it is Hydrocod-hom .[facility] Going out of town tomm. Wants to know if it can be called in asap.

## 2011-09-05 NOTE — Telephone Encounter (Signed)
Last seen 07/2011 - I dont see this on her med list past or present Please advise

## 2011-09-05 NOTE — Telephone Encounter (Signed)
Ok    6 oz

## 2011-09-05 NOTE — Telephone Encounter (Signed)
Called in.

## 2011-09-24 ENCOUNTER — Other Ambulatory Visit: Payer: Self-pay | Admitting: Internal Medicine

## 2011-09-26 NOTE — Telephone Encounter (Signed)
Last seen 08/02/11  Last written 03/2011 # [id number] 2RF Please advise

## 2011-09-26 NOTE — Telephone Encounter (Signed)
ok 

## 2011-09-27 NOTE — Telephone Encounter (Signed)
Called in.

## 2011-11-21 ENCOUNTER — Other Ambulatory Visit: Payer: Self-pay | Admitting: Internal Medicine

## 2012-01-25 ENCOUNTER — Encounter: Payer: Self-pay | Admitting: Internal Medicine

## 2012-01-31 ENCOUNTER — Ambulatory Visit (INDEPENDENT_AMBULATORY_CARE_PROVIDER_SITE_OTHER): Payer: Medicare Other | Admitting: Internal Medicine

## 2012-01-31 ENCOUNTER — Encounter: Payer: Self-pay | Admitting: Internal Medicine

## 2012-01-31 VITALS — BP 122/80 | HR 84 | Temp 98.2°F | Resp 18 | Ht 64.5 in | Wt 176.0 lb

## 2012-01-31 DIAGNOSIS — E7800 Pure hypercholesterolemia, unspecified: Secondary | ICD-10-CM

## 2012-01-31 DIAGNOSIS — I10 Essential (primary) hypertension: Secondary | ICD-10-CM

## 2012-01-31 DIAGNOSIS — E119 Type 2 diabetes mellitus without complications: Secondary | ICD-10-CM

## 2012-01-31 DIAGNOSIS — R0989 Other specified symptoms and signs involving the circulatory and respiratory systems: Secondary | ICD-10-CM

## 2012-01-31 DIAGNOSIS — Z0000 Encounter for general adult medical examination without abnormal findings: Secondary | ICD-10-CM

## 2012-01-31 DIAGNOSIS — M159 Polyosteoarthritis, unspecified: Secondary | ICD-10-CM

## 2012-01-31 DIAGNOSIS — E538 Deficiency of other specified B group vitamins: Secondary | ICD-10-CM

## 2012-01-31 LAB — COMPREHENSIVE METABOLIC PANEL
AST: 33 U/L (ref 0–37)
Albumin: 3.8 g/dL (ref 3.5–5.2)
BUN: 23 mg/dL (ref 6–23)
Calcium: 9.5 mg/dL (ref 8.4–10.5)
Chloride: 101 mEq/L (ref 96–112)
Potassium: 4.3 mEq/L (ref 3.5–5.1)

## 2012-01-31 LAB — CBC WITH DIFFERENTIAL/PLATELET
Basophils Absolute: 0.1 10*3/uL (ref 0.0–0.1)
Basophils Relative: 1 % (ref 0.0–3.0)
Eosinophils Absolute: 0.4 10*3/uL (ref 0.0–0.7)
Lymphocytes Relative: 31.7 % (ref 12.0–46.0)
MCHC: 33.7 g/dL (ref 30.0–36.0)
Neutrophils Relative %: 52.7 % (ref 43.0–77.0)
Platelets: 253 10*3/uL (ref 150.0–400.0)
RBC: 3.84 Mil/uL — ABNORMAL LOW (ref 3.87–5.11)
RDW: 13.8 % (ref 11.5–14.6)

## 2012-01-31 LAB — LIPID PANEL: VLDL: 64 mg/dL — ABNORMAL HIGH (ref 0.0–40.0)

## 2012-01-31 LAB — TSH: TSH: 1.21 u[IU]/mL (ref 0.35–5.50)

## 2012-01-31 LAB — HM DIABETES FOOT EXAM

## 2012-01-31 LAB — LDL CHOLESTEROL, DIRECT: Direct LDL: 85.2 mg/dL

## 2012-01-31 MED ORDER — ZOLPIDEM TARTRATE 10 MG PO TABS: 5.0000 mg | ORAL_TABLET | Freq: Every evening | ORAL | Status: DC | PRN

## 2012-01-31 MED ORDER — GLIMEPIRIDE 4 MG PO TABS: 4.0000 mg | ORAL_TABLET | Freq: Two times a day (BID) | ORAL | Status: DC

## 2012-01-31 MED ORDER — TRAZODONE HCL 100 MG PO TABS: 100.0000 mg | ORAL_TABLET | Freq: Every day | ORAL | Status: DC

## 2012-01-31 MED ORDER — FLUTICASONE PROPIONATE 50 MCG/ACT NA SUSP: 1.0000 | Freq: Every day | NASAL | Status: DC

## 2012-01-31 MED ORDER — HYDROCODONE-ACETAMINOPHEN 5-500 MG PO TABS: 1.0000 | ORAL_TABLET | Freq: Four times a day (QID) | ORAL | Status: DC | PRN

## 2012-01-31 MED ORDER — VENLAFAXINE HCL ER 75 MG PO CP24: 75.0000 mg | ORAL_CAPSULE | Freq: Every day | ORAL | Status: DC

## 2012-01-31 MED ORDER — SIMVASTATIN 40 MG PO TABS: 20.0000 mg | ORAL_TABLET | Freq: Every day | ORAL | Status: DC

## 2012-01-31 MED ORDER — LISINOPRIL 20 MG PO TABS: 20.0000 mg | ORAL_TABLET | Freq: Every day | ORAL | Status: DC

## 2012-01-31 MED ORDER — HYDROCHLOROTHIAZIDE 12.5 MG PO CAPS: 12.5000 mg | ORAL_CAPSULE | ORAL | Status: DC

## 2012-01-31 MED ORDER — METFORMIN HCL ER 500 MG PO TB24: 500.0000 mg | ORAL_TABLET | Freq: Two times a day (BID) | ORAL | Status: DC

## 2012-01-31 NOTE — Progress Notes (Signed)
Subjective:    Patient ID: [name] [name], female    DOB: 05/07/1929, 78 y.o.   MRN: [id number]  HPI  44 -year-old patient who is seen today for an annual exam. Medical problems include type 2 diabetes. She is followed by endocrinology and has done quite well on her present regimen. She has treated hypertension and dyslipidemia. She denies any cardiopulmonary complaints. Additional problems include osteoarthritis. She had a full GI evaluation in late 2011 that included upper and lower panendoscopy as well as an abdominal CT scan.  Past Medical History  Diagnosis Date  . DEGENERATIVE JOINT DISEASE, GENERALIZED 02/01/2007    Qualifier: Diagnosis of  By: Burnice Logan  MD, Doretha Sou   . DIARRHEA, CHRONIC 08/20/2009    Qualifier: Diagnosis of  By: Burnice Logan  MD, Doretha Sou   . DM 02/01/2007    Qualifier: Diagnosis of  By: Floyde Parkins    . FATIGUE 10/20/2008    Qualifier: Diagnosis of  By: Burnice Logan  MD, Doretha Sou   . HYPERCHOLESTEROLEMIA 02/01/2007    Qualifier: Diagnosis of  By: Floyde Parkins    . HYPERLIPIDEMIA 12/20/2007    Qualifier: Diagnosis of  By: Burnice Logan  MD, Doretha Sou   . HYPERTENSION 02/01/2007    Qualifier: Diagnosis of  By: Floyde Parkins    . LOW BACK PAIN 01/13/2010    Qualifier: Diagnosis of  By: Burnice Logan  MD, Doretha Sou MUSCLE CRAMPS 04/06/2010    Qualifier: Diagnosis of  By: Burnice Logan  MD, Doretha Sou   . MYALGIA 01/20/2010    Qualifier: Diagnosis of  By: Elease Hashimoto MD, Bruce    . OSTEOARTHRITIS 01/13/2010    Qualifier: Diagnosis of  By: Burnice Logan  MD, Doretha Sou   . OVERACTIVE BLADDER 08/20/2009    Qualifier: Diagnosis of  By: Burnice Logan  MD, Doretha Sou   . PLEURISY 06/18/2008    Qualifier: Diagnosis of  By: Niel Hummer MD, [place] PNEUMONIA 06/18/2008    Qualifier: Diagnosis of  By: Niel Hummer MD, [place] Rectal prolapse 09/20/2007    Qualifier: Diagnosis of  By: Burnice Logan  MD, Doretha Sou   . VITAMIN B12 DEFICIENCY 07/20/2010    Qualifier: Diagnosis of  By: Carlean Purl MD,  Dimas Millin     History   Social History  . Marital Status: Single    Spouse Name: N/A    Number of Children: N/A  . Years of Education: N/A   Occupational History  . Not on file.   Social History Main Topics  . Smoking status: Former Smoker    Quit date: 07/04/1938  . Smokeless tobacco: Never Used  . Alcohol Use: No  . Drug Use: No  . Sexually Active: Not on file   Other Topics Concern  . Not on file   Social History Narrative  . No narrative on file    Past Surgical History  Procedure Date  . Abdominal hysterectomy   . Cholecystectomy   . Bladder surgery     Family History  Problem Relation Age of Onset  . Deep vein thrombosis Mother   . COPD Father   . Asthma Father   . Diabetes Neg Hx     family hx  . Cancer Neg Hx     breast ca - sister(S)?  Marland Kitchen Heart disease Neg Hx     family hx    Allergies  Allergen Reactions  . Metformin     REACTION: diarrhea  .  Sitagliptin Phosphate     REACTION: abdominal pain    Current Outpatient Prescriptions on File Prior to Visit  Medication Sig Dispense Refill  . albuterol (PROVENTIL,VENTOLIN) 90 MCG/ACT inhaler Inhale 2 puffs into the lungs every 6 (six) hours as needed.  34 g  3  . aspirin 81 MG tablet Take 81 mg by mouth daily.        . BD PEN NEEDLE NANO U/F 32G X 4 MM MISC USE ONE AS DIRECTED  EVERY DAY AND AS NEEDED  100 each  2  . cyanocobalamin 1000 MCG tablet Take 100 mcg by mouth daily.        . diphenhydramine-acetaminophen (TYLENOL PM) 25-500 MG TABS Take 1 tablet by mouth at bedtime as needed.        Marland Kitchen glimepiride (AMARYL) 4 MG tablet Take 4 mg by mouth 2 (two) times daily.       . hydrochlorothiazide (,MICROZIDE/HYDRODIURIL,) 12.5 MG capsule Take 1 capsule (12.5 mg total) by mouth every morning.  90 capsule  3  . HYDROcodone-acetaminophen (VICODIN) 5-500 MG per tablet TAKE ONE TO TWO TABLETS BY MOUTH EVERY 4 HOURS AS NEEDED FOR PAIN  90 tablet  0  . HYDROcodone-homatropine (HYCODAN) 5-1.5 MG/5ML syrup  TAKE ONE TEASPOONFUL BY MOUTH EVERY 6 HOURS AS NEEDED FOR COUGH  120 mL  0  . insulin glargine (LANTUS SOLOSTAR) 100 UNIT/ML injection Inject 56 Units into the skin at bedtime.  45 mL  3  . lisinopril (PRINIVIL,ZESTRIL) 20 MG tablet Take 1 tablet (20 mg total) by mouth daily.  90 tablet  3  . metFORMIN (GLUCOPHAGE-XR) 500 MG 24 hr tablet Take 500 mg by mouth 2 (two) times daily.       . Multiple Vitamin (MULTIVITAMIN) tablet Take 1 tablet by mouth daily.        . ONE TOUCH ULTRA TEST test strip USE TO TEST BLOOD SUGAR THREE TIMES DAILY AND AS NEEDED  100 each  4  . simvastatin (ZOCOR) 40 MG tablet Take 0.5 tablets (20 mg total) by mouth at bedtime. 1/2 tablet daily  45 tablet  3  . traZODone (DESYREL) 100 MG tablet Take 1 tablet (100 mg total) by mouth at bedtime.  90 tablet  3  . venlafaxine (EFFEXOR-XR) 75 MG 24 hr capsule Take 1 capsule (75 mg total) by mouth daily.  90 capsule  3  . zolpidem (AMBIEN) 10 MG tablet       . calcium-vitamin D (OSCAL WITH D 500-200) 500-200 MG-UNIT per tablet Take 1 tablet by mouth daily.        . fluocinonide (LIDEX) 0.05 % cream Apply topically 2 (two) times daily.  120 g  3  . fluticasone (FLONASE) 50 MCG/ACT nasal spray Place 1 spray into the nose daily.  32 g  3    BP 122/80  Pulse 84  Temp 98.2 F (36.8 C) (Oral)  Resp 18  Ht 5' 4.5" (1.638 m)  Wt 176 lb (79.833 kg)  BMI 29.74 kg/m2  SpO2 96%   1. Risk factors, based on past  M,S,F history cardiovascular risk factors include diabetes hypertension and dyslipidemia.-   2.  Physical activities:Fairly sedentary but no activity restrictions   3.  Depression/mood:History depression which has been stable on present regimen   4.  Hearing:No significant deficits   5.  ADL's:Independent in all aspects of daily living   6.  Fall risk:Low   7.  Home safety:No problems identified   8.  Height weight,  and visual acuity;Height and weight stable no change in visual acuity does have eye examination every  January   9.  Counseling:Regular exercise modest weight loss encouraged   10. Lab orders based on risk factors:Laboratory studies including lipid profile will be reviewed   11. Referral :Followup endocrinology   12. Care plan:We'll check carotid artery Doppler studies   13. Cognitive assessment: Alert and oriented with normal affect. No cognitive dysfunction.        Review of Systems  Constitutional: Negative for fever, appetite change, fatigue and unexpected weight change.  HENT: Negative for hearing loss, ear pain, nosebleeds, congestion, sore throat, mouth sores, trouble swallowing, neck stiffness, dental problem, voice change, sinus pressure and tinnitus.   Eyes: Negative for photophobia, pain, redness and visual disturbance.  Respiratory: Negative for cough, chest tightness and shortness of breath.   Cardiovascular: Negative for chest pain, palpitations and leg swelling.  Gastrointestinal: Negative for nausea, vomiting, abdominal pain, diarrhea, constipation, blood in stool, abdominal distention and rectal pain.  Genitourinary: Negative for dysuria, urgency, frequency, hematuria, flank pain, vaginal bleeding, vaginal discharge, difficulty urinating, genital sores, vaginal pain, menstrual problem and pelvic pain.  Musculoskeletal: Negative for back pain and arthralgias.  Skin: Negative for rash.  Neurological: Negative for dizziness, syncope, speech difficulty, weakness, light-headedness, numbness and headaches.  Hematological: Negative for adenopathy. Does not bruise/bleed easily.  Psychiatric/Behavioral: Negative for suicidal ideas, behavioral problems, self-injury, dysphoric mood and agitation. The patient is not nervous/anxious.        Objective:   Physical Exam  Constitutional: She is oriented to person, place, and time. She appears well-developed and well-nourished.  HENT:  Head: Normocephalic and atraumatic.  Right Ear: External ear normal.  Left Ear: External ear  normal.  Mouth/Throat: Oropharynx is clear and moist.  Eyes: Conjunctivae and EOM are normal.  Neck: Normal range of motion. Neck supple. No JVD present. No thyromegaly present.       Bilateral carotid bruits left greater than right  Cardiovascular: Normal rate, regular rhythm, normal heart sounds and intact distal pulses.   No murmur heard. Pulmonary/Chest: Effort normal and breath sounds normal. She has no wheezes. She has no rales.  Abdominal: Soft. Bowel sounds are normal. She exhibits no distension and no mass. There is no tenderness. There is no rebound and no guarding.       Abdominal scar noted  Musculoskeletal: Normal range of motion. She exhibits no edema and no tenderness.  Neurological: She is alert and oriented to person, place, and time. She has normal reflexes. No cranial nerve deficit. She exhibits normal muscle tone. Coordination normal.  Skin: Skin is warm and dry. No rash noted.  Psychiatric: She has a normal mood and affect. Her behavior is normal.          Assessment & Plan:  Preventive health examination Bilateral carotid bruits. We'll check a carotid artery Doppler study Diabetes mellitus. Followup endocrinology Hypertension well controlled Dyslipidemia. We'll check a lipid profile  Recheck 1 year

## 2012-01-31 NOTE — Patient Instructions (Signed)
Limit your sodium (Salt) intake    It is important that you exercise regularly, at least 20 minutes 3 to 4 times per week.  If you develop chest pain or shortness of breath seek  medical attention.  You need to lose weight.  Consider a lower calorie diet and regular exercise.  Endocrinology followup  Anuli examination  Return in one year for follow-up  Carotid Doppler study as discussed

## 2012-02-07 ENCOUNTER — Encounter (INDEPENDENT_AMBULATORY_CARE_PROVIDER_SITE_OTHER): Payer: Medicare Other

## 2012-02-07 DIAGNOSIS — R0989 Other specified symptoms and signs involving the circulatory and respiratory systems: Secondary | ICD-10-CM

## 2012-03-01 ENCOUNTER — Other Ambulatory Visit: Payer: Self-pay | Admitting: Internal Medicine

## 2012-03-09 ENCOUNTER — Encounter: Payer: Self-pay | Admitting: Internal Medicine

## 2012-03-09 ENCOUNTER — Ambulatory Visit (INDEPENDENT_AMBULATORY_CARE_PROVIDER_SITE_OTHER): Payer: Medicare Other | Admitting: Internal Medicine

## 2012-03-09 VITALS — BP 120/76 | Temp 98.1°F | Wt 178.0 lb

## 2012-03-09 DIAGNOSIS — R350 Frequency of micturition: Secondary | ICD-10-CM

## 2012-03-09 DIAGNOSIS — R300 Dysuria: Secondary | ICD-10-CM

## 2012-03-09 DIAGNOSIS — M26609 Unspecified temporomandibular joint disorder, unspecified side: Secondary | ICD-10-CM

## 2012-03-09 DIAGNOSIS — E119 Type 2 diabetes mellitus without complications: Secondary | ICD-10-CM

## 2012-03-09 DIAGNOSIS — I10 Essential (primary) hypertension: Secondary | ICD-10-CM

## 2012-03-09 LAB — POCT URINALYSIS DIPSTICK
Bilirubin, UA: NEGATIVE
Nitrite, UA: NEGATIVE
pH, UA: 5.5

## 2012-03-09 NOTE — Progress Notes (Signed)
Subjective:    Patient ID: [name] [name], female    DOB: 04-07-1929, 78 y.o.   MRN: [id number]  HPI 78 year old patient who has hypertension and diabetes who presents today with 2 complaints she complains of the urinary urgency and some mild dysuria it has been intermittent for months. She does have a prior history of UTIs. Urinalysis reviewed today and was unremarkable. She also complains of pain involving her left ear area on closer questioning this appears to be involving the left TMJ Past Medical History  Diagnosis Date  . DEGENERATIVE JOINT DISEASE, GENERALIZED 02/01/2007    Qualifier: Diagnosis of  By: Burnice Logan  MD, Doretha Sou   . DIARRHEA, CHRONIC 08/20/2009    Qualifier: Diagnosis of  By: Burnice Logan  MD, Doretha Sou   . DM 02/01/2007    Qualifier: Diagnosis of  By: Floyde Parkins    . FATIGUE 10/20/2008    Qualifier: Diagnosis of  By: Burnice Logan  MD, Doretha Sou   . HYPERCHOLESTEROLEMIA 02/01/2007    Qualifier: Diagnosis of  By: Floyde Parkins    . HYPERLIPIDEMIA 12/20/2007    Qualifier: Diagnosis of  By: Burnice Logan  MD, Doretha Sou   . HYPERTENSION 02/01/2007    Qualifier: Diagnosis of  By: Floyde Parkins    . LOW BACK PAIN 01/13/2010    Qualifier: Diagnosis of  By: Burnice Logan  MD, Doretha Sou MUSCLE CRAMPS 04/06/2010    Qualifier: Diagnosis of  By: Burnice Logan  MD, Doretha Sou   . MYALGIA 01/20/2010    Qualifier: Diagnosis of  By: Elease Hashimoto MD, [place]    . OSTEOARTHRITIS 01/13/2010    Qualifier: Diagnosis of  By: Burnice Logan  MD, Doretha Sou   . OVERACTIVE BLADDER 08/20/2009    Qualifier: Diagnosis of  By: Burnice Logan  MD, Doretha Sou   . PLEURISY 06/18/2008    Qualifier: Diagnosis of  By: Niel Hummer MD, [place] PNEUMONIA 06/18/2008    Qualifier: Diagnosis of  By: Niel Hummer MD, [place] Rectal prolapse 09/20/2007    Qualifier: Diagnosis of  By: Burnice Logan  MD, Doretha Sou   . VITAMIN B12 DEFICIENCY 07/20/2010    Qualifier: Diagnosis of  By: Carlean Purl MD, Dimas Millin     History   Social History   . Marital Status: Single    Spouse Name: N/A    Number of Children: N/A  . Years of Education: N/A   Occupational History  . Not on file.   Social History Main Topics  . Smoking status: Former Smoker    Quit date: 07/04/1938  . Smokeless tobacco: Never Used  . Alcohol Use: No  . Drug Use: No  . Sexually Active: Not on file   Other Topics Concern  . Not on file   Social History Narrative  . No narrative on file    Past Surgical History  Procedure Date  . Abdominal hysterectomy   . Cholecystectomy   . Bladder surgery     Family History  Problem Relation Age of Onset  . Deep vein thrombosis Mother   . COPD Father   . Asthma Father   . Diabetes Neg Hx     family hx  . Cancer Neg Hx     breast ca - sister(S)?  Marland Kitchen Heart disease Neg Hx     family hx    Allergies  Allergen Reactions  . Metformin     REACTION: diarrhea  . Sitagliptin Phosphate  REACTION: abdominal pain    Current Outpatient Prescriptions on File Prior to Visit  Medication Sig Dispense Refill  . albuterol (PROVENTIL,VENTOLIN) 90 MCG/ACT inhaler Inhale 2 puffs into the lungs every 6 (six) hours as needed.  34 g  3  . aspirin 81 MG tablet Take 81 mg by mouth daily.        . BD PEN NEEDLE NANO U/F 32G X 4 MM MISC USE ONE AS DIRECTED  EVERY DAY AND AS NEEDED  100 each  2  . calcium-vitamin D (OSCAL WITH D 500-200) 500-200 MG-UNIT per tablet Take 1 tablet by mouth daily.        . cyanocobalamin 1000 MCG tablet Take 100 mcg by mouth daily.        . diphenhydramine-acetaminophen (TYLENOL PM) 25-500 MG TABS Take 1 tablet by mouth at bedtime as needed.        . fluocinonide (LIDEX) 0.05 % cream Apply topically 2 (two) times daily.  120 g  3  . fluticasone (FLONASE) 50 MCG/ACT nasal spray Place 1 spray into the nose daily.  32 g  3  . glimepiride (AMARYL) 4 MG tablet Take 1 tablet (4 mg total) by mouth 2 (two) times daily.  180 tablet  4  . hydrochlorothiazide (MICROZIDE) 12.5 MG capsule Take 1 capsule  (12.5 mg total) by mouth every morning.  90 capsule  3  . HYDROcodone-acetaminophen (VICODIN) 5-500 MG per tablet Take 1 tablet by mouth every 6 (six) hours as needed for pain.  90 tablet  2  . HYDROcodone-homatropine (HYCODAN) 5-1.5 MG/5ML syrup TAKE ONE TEASPOONFUL BY MOUTH EVERY 6 HOURS AS NEEDED FOR COUGH  120 mL  0  . insulin glargine (LANTUS SOLOSTAR) 100 UNIT/ML injection Inject 56 Units into the skin at bedtime.  45 mL  3  . lisinopril (PRINIVIL,ZESTRIL) 20 MG tablet Take 1 tablet (20 mg total) by mouth daily.  90 tablet  3  . metFORMIN (GLUCOPHAGE-XR) 500 MG 24 hr tablet Take 1 tablet (500 mg total) by mouth 2 (two) times daily.  180 tablet  4  . Multiple Vitamin (MULTIVITAMIN) tablet Take 1 tablet by mouth daily.        . ONE TOUCH ULTRA TEST test strip USE TO TEST BLOOD SUGAR THREE TIMES DAILY AND AS NEEDED  100 each  4  . simvastatin (ZOCOR) 40 MG tablet Take 0.5 tablets (20 mg total) by mouth at bedtime. 1/2 tablet daily  45 tablet  3  . traZODone (DESYREL) 100 MG tablet Take 1 tablet (100 mg total) by mouth at bedtime.  90 tablet  3  . venlafaxine XR (EFFEXOR-XR) 75 MG 24 hr capsule Take 1 capsule (75 mg total) by mouth daily.  90 capsule  3  . zolpidem (AMBIEN) 10 MG tablet Take 0.5 tablets (5 mg total) by mouth at bedtime as needed for sleep.  30 tablet  1    BP 120/76  Temp 98.1 F (36.7 C) (Oral)  Wt 178 lb (80.74 kg)      Review of Systems  Constitutional: Negative.   HENT: Positive for ear pain. Negative for hearing loss, congestion, sore throat, rhinorrhea, dental problem, sinus pressure and tinnitus.   Eyes: Negative for pain, discharge and visual disturbance.  Respiratory: Negative for cough and shortness of breath.   Cardiovascular: Negative for chest pain, palpitations and leg swelling.  Gastrointestinal: Negative for nausea, vomiting, abdominal pain, diarrhea, constipation, blood in stool and abdominal distention.  Genitourinary: Positive for urgency, frequency  and  difficulty urinating. Negative for dysuria, hematuria, flank pain, vaginal bleeding, vaginal discharge, vaginal pain and pelvic pain.  Musculoskeletal: Negative for joint swelling, arthralgias and gait problem.  Skin: Negative for rash.  Neurological: Negative for dizziness, syncope, speech difficulty, weakness, numbness and headaches.  Hematological: Negative for adenopathy.  Psychiatric/Behavioral: Negative for behavioral problems, dysphoric mood and agitation. The patient is not nervous/anxious.        Objective:   Physical Exam  Constitutional: She is oriented to person, place, and time. She appears well-developed and well-nourished.  HENT:  Head: Normocephalic.  Right Ear: External ear normal.  Left Ear: External ear normal.  Mouth/Throat: Oropharynx is clear and moist.       The left tympanic membrane was normal. She had mild tenderness over the left TMJ  Eyes: Conjunctivae and EOM are normal. Pupils are equal, round, and reactive to light.  Neck: Normal range of motion. Neck supple. No thyromegaly present.  Cardiovascular: Normal rate, regular rhythm, normal heart sounds and intact distal pulses.   Pulmonary/Chest: Effort normal and breath sounds normal.  Abdominal: Soft. Bowel sounds are normal. She exhibits no mass. There is no tenderness.  Musculoskeletal: Normal range of motion.  Lymphadenopathy:    She has no cervical adenopathy.  Neurological: She is alert and oriented to person, place, and time.  Skin: Skin is warm and dry. No rash noted.  Psychiatric: She has a normal mood and affect. Her behavior is normal.          Assessment & Plan:   TMJ dysfunction. She was referred to her dentist for mouth guard Urinary urgency. No evidence of a UTI. Probable overactive bladder. Was given samples of VESIcare to try for one month

## 2012-03-09 NOTE — Patient Instructions (Signed)
Call or return to clinic prn if these symptoms worsen or fail to improve as anticipated.  Avoid excessive caffeine

## 2012-03-21 ENCOUNTER — Other Ambulatory Visit: Payer: Self-pay | Admitting: Internal Medicine

## 2012-06-04 ENCOUNTER — Other Ambulatory Visit: Payer: Self-pay | Admitting: Internal Medicine

## 2012-07-11 ENCOUNTER — Telehealth: Payer: Self-pay | Admitting: Internal Medicine

## 2012-07-11 NOTE — Telephone Encounter (Signed)
Patient Information:  Caller Name: Ruberta  Phone: [phone]  Patient: [name] [name], [name] [name]  Gender: Female  DOB: Feb 21, 1929  Age: 79 Years  PCP: Bluford Kaufmann [facility])  Office Follow Up:  Does the office need to follow up with this patient?: Yes  Instructions For The Office: Appt full for Dr Burnice Logan.  PLEASE REVIEW AND CONTACT PATIENT AT  [phone] for appt.   Symptoms  Reason For Call & Symptoms: Lower back and hip pain started Sun 1/5 during 500 mile trip with patient driving 1/2 - 1/6.  Reviewed Health History In EMR: Yes  Reviewed Medications In EMR: Yes  Reviewed Allergies In EMR: Yes  Reviewed Surgeries / Procedures: Yes  Date of Onset of Symptoms: 07/08/2012  Treatments Tried: Vicodin for pain  Treatments Tried Worked: No  Guideline(s) Used:  Back Pain  Disposition Per Guideline:   See Today or Tomorrow in Office  Reason For Disposition Reached:   Age > 46 and no history of prior similar back pain  Advice Given:  N/A

## 2012-07-11 NOTE — Telephone Encounter (Signed)
Spoke to pt c/o lower back pain and hip pain. Asked pt if she took anything? Pt stated no. Told pt need to take Aleve or Hydrocodone that she has as prescribed and use heating pad. If no better by tomorrow call back and schedule an appointment. Pt verbalized understanding.

## 2012-07-12 ENCOUNTER — Other Ambulatory Visit: Payer: Self-pay | Admitting: *Deleted

## 2012-07-12 MED ORDER — HYDROCODONE-ACETAMINOPHEN 5-325 MG PO TABS: 1.0000 | ORAL_TABLET | Freq: Four times a day (QID) | ORAL | Status: DC | PRN

## 2012-08-10 ENCOUNTER — Telehealth: Payer: Self-pay | Admitting: Internal Medicine

## 2012-08-10 MED ORDER — IBUPROFEN 600 MG PO TABS: 600.0000 mg | ORAL_TABLET | Freq: Four times a day (QID) | ORAL | Status: DC | PRN

## 2012-08-10 NOTE — Telephone Encounter (Signed)
Spoke to pt told her I can send Rx in for Motrin 600 mg one tablet by mouth every 6 hours as needed. Pt verbalized understanding.

## 2012-08-10 NOTE — Telephone Encounter (Signed)
Patient Information:  Caller Name: Caydin  Phone: [phone]  Patient: [name] [name], [name] [name]  Gender: Female  DOB: [birth year]  Age: 79 Years  PCP: Bluford Kaufmann [facility])  Office Follow Up:  Does the office need to follow up with this patient?: No  Instructions For The Office: N/A  RN Note:  Has been on going for past month.  Starts at top of left hip arch and is running down leg.  Pain is 10/10 when it hit, it is calming now and is about 5/10.  It is an old hip injury that was aggravated by travel in early January 2014  Triaged offering an Saturday [facility] appointment of one on 08/13/12 but patient declined.  Care advice given, patient demonstrated understanding.  Symptoms  Reason For Call & Symptoms: Hip concerns  Reviewed Health History In EMR: Yes  Reviewed Medications In EMR: Yes  Reviewed Allergies In EMR: Yes  Reviewed Surgeries / Procedures: Yes  Date of Onset of Symptoms: 07/08/2012  Treatments Tried: Vicodin, spray hip med, heating pad it will calm it down; but it is still flaring  Treatments Tried Worked: Yes  Guideline(s) Used:  Leg Pain  Disposition Per Guideline:   See Within 3 Days in Office  Reason For Disposition Reached:   Moderate pain (e.g., interferes with normal activities, limping) and present > 3 days  Advice Given:  N/A  Patient Refused Recommendation:  Patient Will Follow Up With Office Later  Patient wanted an appointment for today, but none were available.  Offered the [place] office tomorrow or to schedule appointment for 02/10.  Patient declined at this time.

## 2012-08-10 NOTE — Telephone Encounter (Signed)
Patient calling back, would like a script for Motrin 800 mg tabs to use instead of the Vicodin.  Uses [facility] on [address].  Advised that she can take 4 (200mg ) tabs but she doesn't want to take 4 pills, she wants 1.

## 2012-08-23 ENCOUNTER — Encounter: Payer: Self-pay | Admitting: Internal Medicine

## 2012-08-23 ENCOUNTER — Ambulatory Visit (INDEPENDENT_AMBULATORY_CARE_PROVIDER_SITE_OTHER): Payer: Medicare HMO | Admitting: Internal Medicine

## 2012-08-23 VITALS — BP 140/90 | HR 79 | Temp 98.8°F | Resp 20 | Wt 183.0 lb

## 2012-08-23 DIAGNOSIS — I10 Essential (primary) hypertension: Secondary | ICD-10-CM

## 2012-08-23 DIAGNOSIS — M545 Low back pain, unspecified: Secondary | ICD-10-CM

## 2012-08-23 DIAGNOSIS — E7800 Pure hypercholesterolemia, unspecified: Secondary | ICD-10-CM

## 2012-08-23 DIAGNOSIS — E119 Type 2 diabetes mellitus without complications: Secondary | ICD-10-CM

## 2012-08-23 DIAGNOSIS — M1990 Unspecified osteoarthritis, unspecified site: Secondary | ICD-10-CM

## 2012-08-23 MED ORDER — LOSARTAN POTASSIUM 100 MG PO TABS: 100.0000 mg | ORAL_TABLET | Freq: Every day | ORAL | Status: DC

## 2012-08-23 MED ORDER — HYDROCODONE-ACETAMINOPHEN 5-325 MG PO TABS: 1.0000 | ORAL_TABLET | Freq: Four times a day (QID) | ORAL | Status: DC | PRN

## 2012-08-23 NOTE — Patient Instructions (Addendum)
Limit your sodium (Salt) intake  Please check your blood pressure on a regular basis.  If it is consistently greater than 150/90, please make an office appointment.  Return in 3 months for follow-up    Please check your hemoglobin A1c every 3 months

## 2012-08-23 NOTE — Progress Notes (Signed)
Subjective:    Patient ID: [name] [name], female    DOB: August 18, 1928, 80 y.o.   MRN: [id number]  HPI  79 year old patient who is seen today for followup. She history of hypertension dyslipidemia and hypertension. She presents today with a chief complaint of bilateral hip pain. She does have a history of chronic low back pain. She states that the pain radiates down the proximal legs. She does have a history of lumbar osteoarthritis. She states the pain is worse after prolonged sitting and with walking. Symptoms are intermittent and at times she feels quite well. She does receive symptomatic relief from ibuprofen and analgesics.  Past Medical History  Diagnosis Date  . DEGENERATIVE JOINT DISEASE, GENERALIZED 02/01/2007    Qualifier: Diagnosis of  By: Burnice Logan  MD, Doretha Sou   . DIARRHEA, CHRONIC 08/20/2009    Qualifier: Diagnosis of  By: Burnice Logan  MD, Doretha Sou   . DM 02/01/2007    Qualifier: Diagnosis of  By: Floyde Parkins    . FATIGUE 10/20/2008    Qualifier: Diagnosis of  By: Burnice Logan  MD, Doretha Sou   . HYPERCHOLESTEROLEMIA 02/01/2007    Qualifier: Diagnosis of  By: Floyde Parkins    . HYPERLIPIDEMIA 12/20/2007    Qualifier: Diagnosis of  By: Burnice Logan  MD, Doretha Sou   . HYPERTENSION 02/01/2007    Qualifier: Diagnosis of  By: Floyde Parkins    . LOW BACK PAIN 01/13/2010    Qualifier: Diagnosis of  By: Burnice Logan  MD, Doretha Sou MUSCLE CRAMPS 04/06/2010    Qualifier: Diagnosis of  By: Burnice Logan  MD, Doretha Sou   . MYALGIA 01/20/2010    Qualifier: Diagnosis of  By: Elease Hashimoto MD, Bruce    . OSTEOARTHRITIS 01/13/2010    Qualifier: Diagnosis of  By: Burnice Logan  MD, Doretha Sou   . OVERACTIVE BLADDER 08/20/2009    Qualifier: Diagnosis of  By: Burnice Logan  MD, Doretha Sou   . PLEURISY 06/18/2008    Qualifier: Diagnosis of  By: Niel Hummer MD, Kent PNEUMONIA 06/18/2008    Qualifier: Diagnosis of  By: Niel Hummer MD, Cameron Rectal prolapse 09/20/2007    Qualifier: Diagnosis of  By: Burnice Logan   MD, Doretha Sou   . VITAMIN B12 DEFICIENCY 07/20/2010    Qualifier: Diagnosis of  By: Carlean Purl MD, Dimas Millin     History   Social History  . Marital Status: Single    Spouse Name: N/A    Number of Children: N/A  . Years of Education: N/A   Occupational History  . Not on file.   Social History Main Topics  . Smoking status: Former Smoker    Quit date: 07/04/1938  . Smokeless tobacco: Never Used  . Alcohol Use: No  . Drug Use: No  . Sexually Active: Not on file   Other Topics Concern  . Not on file   Social History Narrative  . No narrative on file    Past Surgical History  Procedure Laterality Date  . Abdominal hysterectomy    . Cholecystectomy    . Bladder surgery      Family History  Problem Relation Age of Onset  . Deep vein thrombosis Mother   . COPD Father   . Asthma Father   . Diabetes Neg Hx     family hx  . Cancer Neg Hx     breast ca - sister(S)?  Marland Kitchen Heart disease Neg Hx  family hx    Allergies  Allergen Reactions  . Metformin     REACTION: diarrhea  . Sitagliptin Phosphate     REACTION: abdominal pain    Current Outpatient Prescriptions on File Prior to Visit  Medication Sig Dispense Refill  . albuterol (PROVENTIL,VENTOLIN) 90 MCG/ACT inhaler Inhale 2 puffs into the lungs every 6 (six) hours as needed.  34 g  3  . aspirin 81 MG tablet Take 81 mg by mouth daily.        . BD PEN NEEDLE NANO U/F 32G X 4 MM MISC USE ONE AS DIRECTED  EVERY DAY AND AS NEEDED  100 each  2  . calcium-vitamin D (OSCAL WITH D 500-200) 500-200 MG-UNIT per tablet Take 1 tablet by mouth daily.        . cyanocobalamin 1000 MCG tablet Take 100 mcg by mouth daily.        . diphenhydramine-acetaminophen (TYLENOL PM) 25-500 MG TABS Take 1 tablet by mouth at bedtime as needed.        . fluocinonide (LIDEX) 0.05 % cream Apply topically 2 (two) times daily.  120 g  3  . fluticasone (FLONASE) 50 MCG/ACT nasal spray Place 1 spray into the nose daily.  32 g  3  . glimepiride  (AMARYL) 4 MG tablet Take 1 tablet (4 mg total) by mouth 2 (two) times daily.  180 tablet  4  . hydrochlorothiazide (MICROZIDE) 12.5 MG capsule Take 1 capsule (12.5 mg total) by mouth every morning.  90 capsule  3  . ibuprofen (ADVIL,MOTRIN) 600 MG tablet Take 1 tablet (600 mg total) by mouth every 6 (six) hours as needed for pain.  60 tablet  0  . LANTUS SOLOSTAR 100 UNIT/ML injection INJECT 56 UNITS SUBCUTANEOUSLY AT BEDTIME  45 Syringe  2  . metFORMIN (GLUCOPHAGE-XR) 500 MG 24 hr tablet Take 1 tablet (500 mg total) by mouth 2 (two) times daily.  180 tablet  4  . Multiple Vitamin (MULTIVITAMIN) tablet Take 1 tablet by mouth daily.        . ONE TOUCH ULTRA TEST test strip USE TO TEST BLOOD SUGAR THREE TIMES DAILY AND AS NEEDED  100 each  4  . simvastatin (ZOCOR) 40 MG tablet Take 0.5 tablets (20 mg total) by mouth at bedtime. 1/2 tablet daily  45 tablet  3  . traZODone (DESYREL) 100 MG tablet Take 1 tablet (100 mg total) by mouth at bedtime.  90 tablet  3  . venlafaxine XR (EFFEXOR-XR) 75 MG 24 hr capsule Take 1 capsule (75 mg total) by mouth daily.  90 capsule  3   No current facility-administered medications on file prior to visit.    BP 140/90  Pulse 79  Temp(Src) 98.8 F (37.1 C) (Oral)  Resp 20  Wt 183 lb (83.008 kg)  BMI 30.94 kg/m2  SpO2 98%       Review of Systems  Constitutional: Negative.   HENT: Negative for hearing loss, congestion, sore throat, rhinorrhea, dental problem, sinus pressure and tinnitus.   Eyes: Negative for pain, discharge and visual disturbance.  Respiratory: Negative for cough and shortness of breath.   Cardiovascular: Negative for chest pain, palpitations and leg swelling.  Gastrointestinal: Negative for nausea, vomiting, abdominal pain, diarrhea, constipation, blood in stool and abdominal distention.  Genitourinary: Negative for dysuria, urgency, frequency, hematuria, flank pain, vaginal bleeding, vaginal discharge, difficulty urinating, vaginal pain  and pelvic pain.  Musculoskeletal: Positive for back pain and arthralgias. Negative for joint swelling  and gait problem.  Skin: Negative for rash.  Neurological: Negative for dizziness, syncope, speech difficulty, weakness, numbness and headaches.  Hematological: Negative for adenopathy.  Psychiatric/Behavioral: Negative for behavioral problems, dysphoric mood and agitation. The patient is not nervous/anxious.        Objective:   Physical Exam  Constitutional: She appears well-developed and well-nourished. No distress.  Blood pressure 130/80 to  Musculoskeletal:  Straight leg testing negative Range of motion the hips appear to be fairly well intact          Assessment & Plan:   Back hip and leg pain. Probable osteoarthritis possible spinal stenosis.  The patient's symptoms are mild to moderate and intermittent. Options discussed. Will continue present symptomatic treatment and consider further imaging studies if symptoms worsen.   Diabetes mellitus. Will check a hemoglobin A1c Hypertension stable

## 2012-09-05 ENCOUNTER — Encounter: Payer: Self-pay | Admitting: Internal Medicine

## 2012-09-25 ENCOUNTER — Other Ambulatory Visit: Payer: Self-pay | Admitting: Family Medicine

## 2012-10-04 ENCOUNTER — Telehealth: Payer: Self-pay | Admitting: Internal Medicine

## 2012-10-04 NOTE — Telephone Encounter (Signed)
Pt had been going to Dr Chalmers Cater, but now they do not except pt's new [facility] HMO. They advise pt to get her PCP to refer her to another diabetic MD.  Pls advise.

## 2012-10-05 ENCOUNTER — Other Ambulatory Visit: Payer: Self-pay | Admitting: Internal Medicine

## 2012-10-05 DIAGNOSIS — E119 Type 2 diabetes mellitus without complications: Secondary | ICD-10-CM

## 2012-10-05 NOTE — Telephone Encounter (Signed)
New referral done for Endo, separate encounter.

## 2012-10-05 NOTE — Telephone Encounter (Signed)
Le I can but I will need a new referral. I believe [facility] that [employer].

## 2012-10-08 ENCOUNTER — Encounter: Payer: Self-pay | Admitting: Internal Medicine

## 2012-10-22 ENCOUNTER — Encounter: Payer: Self-pay | Admitting: Internal Medicine

## 2012-10-22 ENCOUNTER — Ambulatory Visit (INDEPENDENT_AMBULATORY_CARE_PROVIDER_SITE_OTHER): Payer: Medicare HMO | Admitting: Internal Medicine

## 2012-10-22 VITALS — BP 122/60 | HR 67 | Temp 98.1°F | Resp 12 | Ht 63.5 in | Wt 178.0 lb

## 2012-10-22 DIAGNOSIS — E119 Type 2 diabetes mellitus without complications: Secondary | ICD-10-CM

## 2012-10-22 NOTE — Patient Instructions (Signed)
Please return in one month with your sugar log. Please decrease Lantus to 45 units every night if you continue to experience low blood sugars (less than 75).  PATIENT INSTRUCTIONS FOR TYPE 2 DIABETES:  **Please join MyChart!** - see attached instructions about how to join   DIET AND EXERCISE Diet and exercise is an important part of diabetic treatment.  We recommended aerobic exercise in the form of brisk walking (working between 40-60% of maximal aerobic capacity, similar to brisk walking) for 150 minutes per week (such as 30 minutes five days per week) along with 3 times per week performing 'resistance' training (using various gauge rubber tubes with handles) 5-10 exercises involving the major muscle groups (upper body, lower body and core) performing 10-15 repetitions (or near fatigue) each exercise. Start at half the above goal but build slowly to reach the above goals. If limited by weight, joint pain, or disability, we recommend daily walking in a swimming pool with water up to waist to reduce pressure from joints while allow for adequate exercise.    BLOOD GLUCOSES Monitoring your blood glucoses is important for continued management of your diabetes. Please check your blood glucoses 2-4 times a day: fasting, before meals and at bedtime (you can rotate these measurements - e.g. one day check before the 3 meals, the next day check before 2 of the meals and before bedtime, etc.   HYPOGLYCEMIA (low blood sugar) Hypoglycemia is usually a reaction to not eating, exercising, or taking too much insulin/ other diabetes drugs.  Symptoms include tremors, sweating, hunger, confusion, headache, etc. Treat IMMEDIATELY with 15 grams of Carbs:   4 glucose tablets    cup regular juice/soda   2 tablespoons raisins   4 teaspoons sugar   1 tablespoon honey Recheck blood glucose in 15 mins and repeat above if still symptomatic/blood glucose <100. Please contact our office at [phone] if you have  questions about how to next handle your insulin.  RECOMMENDATIONS TO REDUCE YOUR RISK OF DIABETIC COMPLICATIONS: * Take your prescribed MEDICATION(S). * Follow a DIABETIC diet: Complex carbs, fiber rich foods, heart healthy fish twice weekly, (monounsaturated and polyunsaturated) fats * AVOID saturated/trans fats, high fat foods, >2,300 mg salt per day. * EXERCISE at least 5 times a week for 30 minutes or preferably daily.  * DO NOT SMOKE OR DRINK more than 1 drink a day. * Check your FEET every day. Do not wear tightfitting shoes. Contact us if you develop an ulcer * See your EYE doctor once a year or more if needed * Get a FLU shot once a year * Get a PNEUMONIA vaccine once before and once after age 56 years  GOALS:  * Your Hemoglobin A1c of 123XX123  * Your Systolic BP should be [id number] or lower  * Your Diastolic BP should be 80 or lower  * Your HDL (Good Cholesterol) should be 40 or higher  * Your LDL (Bad Cholesterol) should be 100 or lower  * Your Triglycerides should be 150 or lower  * Your Urine microalbumin (kidney function) should be <30 * Your Body Mass Index should be 25 or lower   We will be glad to help you achieve these goals. Our telephone number is: [phone].

## 2012-10-22 NOTE — Progress Notes (Signed)
Patient ID: [name] [name], female   DOB: [birth year], 79 y.o.   MRN: [id number]  HPI: [name] [name] is a 79 y.o.-year-old female, referred by her PCP, Dr.Kwiatkowski, for management of DM2, non-insulin-dependent, uncontrolled, without complications. She saw Dr. Chalmers Cater, but she can not return to see her as patient switched to [facility]  Patient has been diagnosed with diabetes in 2005; she was started on insulin 2 years. Last hemoglobin A1c was: Lab Results  Component Value Date   HGBA1C 7.1* 08/23/2012  previously 9.6%, previously 8.8% 2 years ago.  Pt is on a regimen of: - Lantus 50 units qhs - injects in the abdomen mostly sometimes in thighs, no problems with the injection sites - Amaryl 4 mg bid - Metformin XR 500 mg bid  Pt checks her sugars once a day: - am: 62-122 - later: ~2h after meals: 200s No lows. Lowest sugar was 62 - felt it; she had another low at 64. She cannot remember if she took more insulin before these 2 values but she believes so. ? hypoglycemia awareness. Highest sugar was upper 200s.  Pt's meals are: - Breakfast: protein shakes/toast - Lunch: soup and sandwich - Dinner: ? - Snacks: 2 a day  Pt has chronic kidney disease stage 3-4, last BUN/creatinine was:  Lab Results  Component Value Date   BUN 23 01/31/2012   CREATININE 1.3* 01/31/2012   Last set of lipids: Lab Results  Component Value Date   CHOL 182 01/31/2012   HDL 48.60 01/31/2012   LDLDIRECT 85.2 01/31/2012   TRIG 320.0* 01/31/2012   CHOLHDL 4 01/31/2012   Pt's last eye exam was in 08/24/12, with Dr. Susa Loffler at [facility] ophthalmology. No DR. Denies numbness and tingling in her legs.  PMH: I reviewed her chart and she also has B12 def - takes B12 2500 mcg daily, hypertension, hyperlipidemia, low back pain, DJD, overactive bladder, chronic diarrhea, bilateral hip pain. Last TSH was 1.21 in 01/31/2012.  Pt has FH of DM in brother.  ROS: Constitutional: no weight gain/loss,  + fatigue, no subjective hyperthermia/hypothermia, +  Eyes: no blurry vision, no xerophthalmia ENT: no sore throat, no nodules palpated in throat, no dysphagia/odynophagia, no hoarseness Cardiovascular: no CP/SOB/palpitations/leg swelling Respiratory: + cough from allergies/no SOB Gastrointestinal: no N/V/+ D - result with Pepto-Bismol, happening about once a week/C Musculoskeletal: no muscle/+ joint aches Skin: no rashes Neurological: no tremors/numbness/tingling/dizziness, + headaches Psychiatric: no depression/anxiety Low libido  Past Surgical History  Procedure Laterality Date  . Abdominal hysterectomy    . Cholecystectomy    . Bladder surgery     History   Social History  . Marital Status: widowed    Number of Children: N/A   Occupational History  . retired   Social History Main Topics  . Smoking status: Former Smoker    Quit date: 07/04/1938  . Smokeless tobacco: Never Used  . Alcohol Use: No  . Drug Use: No   Current Outpatient Prescriptions on File Prior to Visit  Medication Sig Dispense Refill  . albuterol (PROVENTIL,VENTOLIN) 90 MCG/ACT inhaler Inhale 2 puffs into the lungs every 6 (six) hours as needed.  34 g  3  . aspirin 81 MG tablet Take 81 mg by mouth daily.        . BD PEN NEEDLE NANO U/F 32G X 4 MM MISC USE ONE AS DIRECTED  EVERY DAY AND AS NEEDED  100 each  2  . calcium-vitamin D (OSCAL WITH D 500-200) 500-200 MG-UNIT  per tablet Take 1 tablet by mouth daily.        . cyanocobalamin 1000 MCG tablet Take 100 mcg by mouth daily.        . diphenhydramine-acetaminophen (TYLENOL PM) 25-500 MG TABS Take 1 tablet by mouth at bedtime as needed.        . fluocinonide (LIDEX) 0.05 % cream Apply topically 2 (two) times daily.  120 g  3  . fluticasone (FLONASE) 50 MCG/ACT nasal spray Place 1 spray into the nose daily.  32 g  3  . glimepiride (AMARYL) 4 MG tablet Take 1 tablet (4 mg total) by mouth 2 (two) times daily.  180 tablet  4  . hydrochlorothiazide (MICROZIDE)  12.5 MG capsule Take 1 capsule (12.5 mg total) by mouth every morning.  90 capsule  3  . HYDROcodone-acetaminophen (NORCO/VICODIN) 5-325 MG per tablet Take 1 tablet by mouth every 6 (six) hours as needed for pain.  90 tablet  0  . ibuprofen (ADVIL,MOTRIN) 600 MG tablet TAKE ONE TABLET BY MOUTH EVERY 6 HOURS AS NEEDED FOR PAIN  60 tablet  0  . LANTUS SOLOSTAR 100 UNIT/ML injection INJECT 56 UNITS SUBCUTANEOUSLY AT BEDTIME  45 Syringe  2  . losartan (COZAAR) 100 MG tablet Take 1 tablet (100 mg total) by mouth daily.  90 tablet  3  . metFORMIN (GLUCOPHAGE-XR) 500 MG 24 hr tablet Take 1 tablet (500 mg total) by mouth 2 (two) times daily.  180 tablet  4  . Multiple Vitamin (MULTIVITAMIN) tablet Take 1 tablet by mouth daily.        . ONE TOUCH ULTRA TEST test strip USE TO TEST BLOOD SUGAR THREE TIMES DAILY AND AS NEEDED  100 each  4  . simvastatin (ZOCOR) 40 MG tablet Take 0.5 tablets (20 mg total) by mouth at bedtime. 1/2 tablet daily  45 tablet  3  . traZODone (DESYREL) 100 MG tablet Take 1 tablet (100 mg total) by mouth at bedtime.  90 tablet  3  . venlafaxine XR (EFFEXOR-XR) 75 MG 24 hr capsule Take 1 capsule (75 mg total) by mouth daily.  90 capsule  3   No current facility-administered medications on file prior to visit.   Allergies  Allergen Reactions  . Metformin     REACTION: diarrhea  . Sitagliptin Phosphate     REACTION: abdominal pain   Family History  Problem Relation Age of Onset  . Deep vein thrombosis Mother   . COPD Father   . Asthma Father   . Diabetes Neg Hx     family hx  . Cancer Neg Hx     breast ca - sister(S)?  Marland Kitchen Heart disease Neg Hx     family hx   PE: BP 122/60  Pulse 67  Temp(Src) 98.1 F (36.7 C) (Oral)  Resp 12  Ht 5' 3.5" (1.613 m)  Wt 178 lb (80.74 kg)  BMI 31.03 kg/m2  SpO2 97% Wt Readings from Last 3 Encounters:  10/22/12 178 lb (80.74 kg)  08/23/12 183 lb (83.008 kg)  03/09/12 178 lb (80.74 kg)   Constitutional: overweight, in NAD Eyes:  PERRLA, EOMI, no exophthalmos ENT: moist mucous membranes, no thyromegaly, no cervical lymphadenopathy Cardiovascular: RRR, No MRG Respiratory: CTA B Gastrointestinal: abdomen soft, NT, ND, BS+ Musculoskeletal: no deformities, strength intact in all 4 Skin: moist, warm, no rashes Neurological: no tremor with outstretched hands, DTR normal in all 4  ASSESSMENT: 1. DM2, non-insulin-dependent, uncontrolled, without complications - She does have kidney  dysfunction, but no diabetic retinopathy, so I doubt that her kidney dysfunction is due to diabetes  PLAN:  1. Patient with long-standing diabetes, with no apparent complications. Her hemoglobin A1c recently decreased to 7.1%, as she mentioned that she improved her diet. Her sugars in a.m. are great, and even on the low side, with 2 values in the 60s. She does not check her sugars later in the day, so it is difficult to assess her control after meals, but she mentions that she checked a few times and they were higher, in the 200's - Due to her to low CBGs in a.m., I advised her to decrease the dose of Lantus however she is reticent to this, as she is afraid that her sugars will increase. She believes that she might have takes more insulin the nights before she had the low values. I advised her then to continue at the same dose, however if she sees sugars <75, to decrease the dose of Lantus to 45. - her Amaryl is another potential source of low CBGs, but I would wait to see sugars later in the day before changing this. Continue same dose for now. - She was on a low dose of metformin XR, 500 twice a day, which I will continue - given sugar log and advised how to fill it and to bring it at next appt, she will need to check twice a day before meals and at bedtime - given foot care handout and explained the principles - given instructions for hypoglycemia management "15-15 rule" - I advised her to join my chart or to call me with her sugars if they stay  high or they dropped too low - I will see her back in a month with her sugar log

## 2012-11-22 ENCOUNTER — Ambulatory Visit (INDEPENDENT_AMBULATORY_CARE_PROVIDER_SITE_OTHER): Payer: Medicare HMO | Admitting: Internal Medicine

## 2012-11-22 ENCOUNTER — Encounter: Payer: Self-pay | Admitting: Internal Medicine

## 2012-11-22 VITALS — HR 83 | Temp 98.7°F | Resp 12 | Ht 63.5 in | Wt 175.0 lb

## 2012-11-22 DIAGNOSIS — E119 Type 2 diabetes mellitus without complications: Secondary | ICD-10-CM

## 2012-11-22 MED ORDER — INSULIN GLARGINE 100 UNIT/ML SOLOSTAR PEN: PEN_INJECTOR | SUBCUTANEOUS | Status: DC

## 2012-11-22 NOTE — Progress Notes (Addendum)
Patient ID: [name] [name], female   DOB: [birth year], 79 y.o.   MRN: [id number]  HPI: [name] [name] is a 79 y.o.-year-old female, returning for f/u for DM2, dx 2005, insulin-dependent, uncontrolled, without complications. She saw Dr. Chalmers Cater in the past, but she cannot return to see her as patient switched to [facility].  Last hemoglobin A1c was: Lab Results  Component Value Date   HGBA1C 7.1* 08/23/2012  previously 9.6%, previously 8.8% 2 years ago.  Pt is on a regimen of: - Lantus 50 units qhs - injects in the abdomen mostly sometimes in thighs, no problems with the injection sites - Metformin XR 500 mg bid - Amaryl 4 mg bid   Pt checks her sugars 1-3 x a day: - am: 62-122 - later: ~2h after dinner: 200s >> 150-200 Had several lows, lower than 80, in a.m. Lowest sugar was 62, but had a total of 3 sugars in the 60s. She has hypoglycemia awareness around 70.   Pt has chronic kidney disease stage 3-4, last BUN/creatinine was:  Lab Results  Component Value Date   BUN 23 01/31/2012   CREATININE 1.3* 01/31/2012   Last set of lipids: Lab Results  Component Value Date   CHOL 182 01/31/2012   HDL 48.60 01/31/2012   LDLDIRECT 85.2 01/31/2012   TRIG 320.0* 01/31/2012   CHOLHDL 4 01/31/2012   Pt's last eye exam was in 08/24/12, with Dr. Susa Loffler at [facility] ophthalmology. No DR. Denies numbness and tingling in her legs.  PMH: I reviewed her chart and she also has B12 def - takes B12 2500 mcg daily, hypertension, hyperlipidemia, low back pain, DJD, overactive bladder, chronic diarrhea, bilateral hip pain. Last TSH was 1.21 in 01/31/2012.  ROS: Constitutional: no weight gain/loss, no fatigue, no subjective hyperthermia/hypothermia, + nocturia Eyes: + blurry vision, no xerophthalmia ENT: no sore throat, + nodules palpated in throat - Right upper neck, post. submandibular, no dysphagia/odynophagia, no hoarseness Cardiovascular: no CP/SOB/palpitations/leg  swelling Respiratory: + cough from allergies/no SOB Gastrointestinal: no N/V/+ D - more frequently now/C Musculoskeletal: no muscle/+ joint aches  Skin: no rashes Neurological: no tremors/numbness/tingling/dizziness/headaches Psychiatric: no depression/anxiety  I reviewed pt's medications, allergies, PMH, social hx, family hx and no changes required, except as mentioned above.  PE: Pulse 83  Temp(Src) 98.7 F (37.1 C) (Oral)  Resp 12  Ht 5' 3.5" (1.613 m)  Wt 175 lb (79.379 kg)  BMI 30.51 kg/m2  SpO2 96% Wt Readings from Last 3 Encounters:  11/22/12 175 lb (79.379 kg)  10/22/12 178 lb (80.74 kg)  08/23/12 183 lb (83.008 kg)   Constitutional: overweight, in NAD Eyes: PERRLA, EOMI, no exophthalmos ENT: moist mucous membranes, no thyromegaly, no cervical lymphadenopathy; patient has a right-sided ~1 cm parotid enlargement, and the gland feels rubbery. This is slightly painful for her. Cardiovascular: RRR, No MRG Respiratory: CTA B Gastrointestinal: abdomen soft, NT, ND, BS+ Musculoskeletal: no deformities, strength intact in all 4 Skin: moist, warm, no rashes Neurological: no tremor with outstretched hands, DTR normal in all 4  ASSESSMENT: 1. DM2, non-insulin-dependent, uncontrolled, without complications - She does have kidney dysfunction, but no diabetic retinopathy, so I doubt that her kidney dysfunction is due to diabetes  2. Parotid duct obstruction  PLAN:  1. Patient with long-standing diabetes, with no apparent complications. Her hemoglobin A1c has decreased to 7.1% at last check, 3 months ago, as she mentioned that she improved her diet. However, at this visit, she tells me that her diet is still  not very good, and she eats large portions and grazes or has another almost full meal after dinner. This is most likely the reason for her high sugars at night.  - we reviewed her log at this visit, and her sugars in the morning are between 64 to 128th, but most of them are in  the 80s and 90s. She had 3 instances when she was in the 60s, and she did not feel good with this. Before dinner her sugars are in the 150-200s range and also had one sugar of 292. - Due to her to low CBGs in a.m., at last visit ,I advised her to decrease the dose of Lantus 45, if she were to drop her sugars in the 70s, but he did not do this, said she was afraid that she would increase her sugars later in the day. At this visit, do to her frequent lows in a.m., I advised her then to decrease the dose of Lantus to 40 units. - her Amaryl is another potential source of low CBGs, but there is no evidence of low sugars later in the day. Continue same dose for now. - She was on a low dose of metformin XR, 500 twice a day, which I will continue.  - given new sugar log and advised how to fill it and to bring it at next appt, she will need to check twice a day - advised her to also check before lunch - we discussed at length about how to improve her diet, mostly what to snack on after dinner, if she really has to, however she does not appear very open to implement these changes, but says she will try. Please see patient's instructions. Since her snacking at night is probably the source of her high sugars before bedtime, I will give her a chance to try to change her diet, but if bedtime sugars do not improve the next visit, we might need to add insulin with dinner. Due to her grazing after dinner, I would probably be prone to use regular insulin rather than rapid acting. She would like to avoid this, which is understandable. - Will check a hemoglobin A1c today - I advised her to join my chart  - I will see her back in 3 months with her sugar log  2. Parotid duct obstruction - Most likely the cause for her parotid enlargement and pain - advised her to suck on sugar-free lemon candy or lemon slices  Office Visit on 11/22/2012  Component Date Value Range Status  . Hemoglobin A1C 11/22/2012 6.7* 4.6 - 6.5 % Final    Glycemic Control Guidelines for People with Diabetes:Non Diabetic:  <6%Goal of Therapy: <7%Additional Action Suggested:  >8%    Improved hemoglobin A1c from 7.1%.

## 2012-11-22 NOTE — Patient Instructions (Addendum)
Decrease your Lantus to 40 units. Please try to only snack on: - vegetables +/- humus  - fruit: apples, pears, berries, peaches, apricots, cherries - unsalted peanuts (no more than a handful) Please return in 3 months with your sugar log.  Also check sugars before lunch about 2-3 x a week.

## 2012-11-23 ENCOUNTER — Ambulatory Visit: Payer: Medicare Other | Admitting: Internal Medicine

## 2012-11-23 ENCOUNTER — Telehealth: Payer: Self-pay | Admitting: *Deleted

## 2012-11-23 NOTE — Telephone Encounter (Signed)
Called pt and lvm with her HgbA1C results, Dr Cruzita Lederer said they have improved to 6.7, from 7.1. This is great! If she has any questions or problems give [place] a call and we will see her at her next follow up appt.  ,

## 2012-11-27 ENCOUNTER — Encounter: Payer: Self-pay | Admitting: Internal Medicine

## 2012-11-27 ENCOUNTER — Ambulatory Visit (INDEPENDENT_AMBULATORY_CARE_PROVIDER_SITE_OTHER): Payer: Medicare HMO | Admitting: Internal Medicine

## 2012-11-27 VITALS — BP 120/66 | HR 92 | Temp 98.4°F | Resp 20 | Wt 182.0 lb

## 2012-11-27 DIAGNOSIS — M1990 Unspecified osteoarthritis, unspecified site: Secondary | ICD-10-CM

## 2012-11-27 DIAGNOSIS — I10 Essential (primary) hypertension: Secondary | ICD-10-CM

## 2012-11-27 DIAGNOSIS — E119 Type 2 diabetes mellitus without complications: Secondary | ICD-10-CM

## 2012-11-27 NOTE — Patient Instructions (Signed)
Limit your sodium (Salt) intake   Please check your hemoglobin A1c every 3 months  Return in 6 months for follow-up  

## 2012-11-27 NOTE — Progress Notes (Signed)
Subjective:    Patient ID: [name] [name], female    DOB: 01-05-1929, 79 y.o.   MRN: [id number]  HPI   79 year old patient who is seen today for followup. She has a history of type 2 diabetes and is followed by endocrinology. She has hypertension dyslipidemia and a history of osteoarthritis. In general doing fairly well.  Past Medical History  Diagnosis Date  . DEGENERATIVE JOINT DISEASE, GENERALIZED 02/01/2007    Qualifier: Diagnosis of  By: Burnice Logan  MD, Doretha Sou   . DIARRHEA, CHRONIC 08/20/2009    Qualifier: Diagnosis of  By: Burnice Logan  MD, Doretha Sou   . DM 02/01/2007    Qualifier: Diagnosis of  By: Floyde Parkins    . FATIGUE 10/20/2008    Qualifier: Diagnosis of  By: Burnice Logan  MD, Doretha Sou   . HYPERCHOLESTEROLEMIA 02/01/2007    Qualifier: Diagnosis of  By: Floyde Parkins    . HYPERLIPIDEMIA 12/20/2007    Qualifier: Diagnosis of  By: Burnice Logan  MD, Doretha Sou   . HYPERTENSION 02/01/2007    Qualifier: Diagnosis of  By: Floyde Parkins    . LOW BACK PAIN 01/13/2010    Qualifier: Diagnosis of  By: Burnice Logan  MD, Doretha Sou MUSCLE CRAMPS 04/06/2010    Qualifier: Diagnosis of  By: Burnice Logan  MD, Doretha Sou   . MYALGIA 01/20/2010    Qualifier: Diagnosis of  By: Elease Hashimoto MD, [place]    . OSTEOARTHRITIS 01/13/2010    Qualifier: Diagnosis of  By: Burnice Logan  MD, Doretha Sou   . OVERACTIVE BLADDER 08/20/2009    Qualifier: Diagnosis of  By: Burnice Logan  MD, Doretha Sou   . PLEURISY 06/18/2008    Qualifier: Diagnosis of  By: Niel Hummer MD, [place] PNEUMONIA 06/18/2008    Qualifier: Diagnosis of  By: Niel Hummer MD, [place] Rectal prolapse 09/20/2007    Qualifier: Diagnosis of  By: Burnice Logan  MD, Doretha Sou   . VITAMIN B12 DEFICIENCY 07/20/2010    Qualifier: Diagnosis of  By: Carlean Purl MD, Dimas Millin     History   Social History  . Marital Status: Single    Spouse Name: N/A    Number of Children: N/A  . Years of Education: N/A   Occupational History  . Not on file.   Social History Main  Topics  . Smoking status: Former Smoker    Quit date: 07/04/1938  . Smokeless tobacco: Never Used  . Alcohol Use: No  . Drug Use: No  . Sexually Active: Not on file   Other Topics Concern  . Not on file   Social History Narrative  . No narrative on file    Past Surgical History  Procedure Laterality Date  . Abdominal hysterectomy    . Cholecystectomy    . Bladder surgery      Family History  Problem Relation Age of Onset  . Deep vein thrombosis Mother   . COPD Father   . Asthma Father   . Diabetes Neg Hx     family hx  . Cancer Neg Hx     breast ca - sister(S)?  Marland Kitchen Heart disease Neg Hx     family hx    Allergies  Allergen Reactions  . Metformin     REACTION: diarrhea  . Sitagliptin Phosphate     REACTION: abdominal pain    Current Outpatient Prescriptions on File Prior to Visit  Medication Sig Dispense Refill  . albuterol (  PROVENTIL,VENTOLIN) 90 MCG/ACT inhaler Inhale 2 puffs into the lungs every 6 (six) hours as needed.  34 g  3  . aspirin 81 MG tablet Take 81 mg by mouth daily.        . BD PEN NEEDLE NANO U/F 32G X 4 MM MISC USE ONE AS DIRECTED  EVERY DAY AND AS NEEDED  100 each  2  . calcium-vitamin D (OSCAL WITH D 500-200) 500-200 MG-UNIT per tablet Take 1 tablet by mouth daily.        . cyanocobalamin 1000 MCG tablet Take 100 mcg by mouth daily.        . diphenhydramine-acetaminophen (TYLENOL PM) 25-500 MG TABS Take 1 tablet by mouth at bedtime as needed.        . fluticasone (FLONASE) 50 MCG/ACT nasal spray Place 1 spray into the nose daily.  32 g  3  . glimepiride (AMARYL) 4 MG tablet Take 1 tablet (4 mg total) by mouth 2 (two) times daily.  180 tablet  4  . hydrochlorothiazide (MICROZIDE) 12.5 MG capsule Take 1 capsule (12.5 mg total) by mouth every morning.  90 capsule  3  . HYDROcodone-acetaminophen (NORCO/VICODIN) 5-325 MG per tablet Take 1 tablet by mouth every 6 (six) hours as needed for pain.  90 tablet  0  . ibuprofen (ADVIL,MOTRIN) 600 MG tablet  TAKE ONE TABLET BY MOUTH EVERY 6 HOURS AS NEEDED FOR PAIN  60 tablet  0  . Insulin Glargine (LANTUS SOLOSTAR) 100 UNIT/ML SOPN Inject under skin 40 units at bedtime.  5 pen  3  . losartan (COZAAR) 100 MG tablet Take 1 tablet (100 mg total) by mouth daily.  90 tablet  3  . metFORMIN (GLUCOPHAGE-XR) 500 MG 24 hr tablet Take 1 tablet (500 mg total) by mouth 2 (two) times daily.  180 tablet  4  . Multiple Vitamin (MULTIVITAMIN) tablet Take 1 tablet by mouth daily.        . ONE TOUCH ULTRA TEST test strip USE TO TEST BLOOD SUGAR THREE TIMES DAILY AND AS NEEDED  100 each  4  . simvastatin (ZOCOR) 40 MG tablet Take 0.5 tablets (20 mg total) by mouth at bedtime. 1/2 tablet daily  45 tablet  3  . traZODone (DESYREL) 100 MG tablet Take 1 tablet (100 mg total) by mouth at bedtime.  90 tablet  3  . venlafaxine XR (EFFEXOR-XR) 75 MG 24 hr capsule Take 1 capsule (75 mg total) by mouth daily.  90 capsule  3   No current facility-administered medications on file prior to visit.    BP 120/66  Pulse 92  Temp(Src) 98.4 F (36.9 C) (Oral)  Resp 20  Wt 182 lb (82.555 kg)  BMI 31.73 kg/m2  SpO2 95%       Review of Systems  Constitutional: Negative.   HENT: Negative for hearing loss, congestion, sore throat, rhinorrhea, dental problem, sinus pressure and tinnitus.   Eyes: Negative for pain, discharge and visual disturbance.  Respiratory: Negative for cough and shortness of breath.   Cardiovascular: Negative for chest pain, palpitations and leg swelling.  Gastrointestinal: Negative for nausea, vomiting, abdominal pain, diarrhea, constipation, blood in stool and abdominal distention.  Genitourinary: Negative for dysuria, urgency, frequency, hematuria, flank pain, vaginal bleeding, vaginal discharge, difficulty urinating, vaginal pain and pelvic pain.  Musculoskeletal: Positive for back pain and arthralgias. Negative for joint swelling and gait problem.  Skin: Negative for rash.  Neurological: Negative  for dizziness, syncope, speech difficulty, weakness, numbness and headaches.  Hematological: Negative for adenopathy.  Psychiatric/Behavioral: Negative for behavioral problems, dysphoric mood and agitation. The patient is not nervous/anxious.        Objective:   Physical Exam  Constitutional: She is oriented to person, place, and time. She appears well-developed and well-nourished.  HENT:  Head: Normocephalic.  Right Ear: External ear normal.  Left Ear: External ear normal.  Mouth/Throat: Oropharynx is clear and moist.  Eyes: Conjunctivae and EOM are normal. Pupils are equal, round, and reactive to light.  Neck: Normal range of motion. Neck supple. No thyromegaly present.  Cardiovascular: Normal rate, regular rhythm, normal heart sounds and intact distal pulses.   Pulmonary/Chest: Effort normal and breath sounds normal.  Abdominal: Soft. Bowel sounds are normal. She exhibits no mass. There is no tenderness.  Musculoskeletal: Normal range of motion.  Lymphadenopathy:    She has no cervical adenopathy.  Neurological: She is alert and oriented to person, place, and time.  Skin: Skin is warm and dry. No rash noted.  Psychiatric: She has a normal mood and affect. Her behavior is normal.          Assessment & Plan:   Diabetes mellitus. Followup endocrinology hypertension stable  osteoarthritis  dyslipidemia  CPX 6 months

## 2012-12-17 ENCOUNTER — Other Ambulatory Visit: Payer: Self-pay | Admitting: Internal Medicine

## 2012-12-19 ENCOUNTER — Emergency Department (INDEPENDENT_AMBULATORY_CARE_PROVIDER_SITE_OTHER): Payer: Medicare Other

## 2012-12-19 ENCOUNTER — Emergency Department (INDEPENDENT_AMBULATORY_CARE_PROVIDER_SITE_OTHER)
Admission: EM | Admit: 2012-12-19 | Discharge: 2012-12-19 | Disposition: A | Discharge status: Home / Self Care | Payer: Medicare Other | Source: Home / Self Care | Attending: Family Medicine | Admitting: Family Medicine

## 2012-12-19 ENCOUNTER — Encounter (HOSPITAL_COMMUNITY): Payer: Self-pay | Admitting: *Deleted

## 2012-12-19 ENCOUNTER — Telehealth: Payer: Self-pay | Admitting: Internal Medicine

## 2012-12-19 DIAGNOSIS — J309 Allergic rhinitis, unspecified: Secondary | ICD-10-CM

## 2012-12-19 DIAGNOSIS — IMO0002 Reserved for concepts with insufficient information to code with codable children: Secondary | ICD-10-CM

## 2012-12-19 DIAGNOSIS — S6990XA Unspecified injury of unspecified wrist, hand and finger(s), initial encounter: Secondary | ICD-10-CM

## 2012-12-19 DIAGNOSIS — S6980XA Other specified injuries of unspecified wrist, hand and finger(s), initial encounter: Secondary | ICD-10-CM

## 2012-12-19 DIAGNOSIS — S76012A Strain of muscle, fascia and tendon of left hip, initial encounter: Secondary | ICD-10-CM

## 2012-12-19 DIAGNOSIS — J302 Other seasonal allergic rhinitis: Secondary | ICD-10-CM

## 2012-12-19 HISTORY — DX: Unspecified asthma, uncomplicated: J45.909

## 2012-12-19 LAB — POCT URINALYSIS DIP (DEVICE)
Bilirubin Urine: NEGATIVE
Glucose, UA: NEGATIVE mg/dL
Ketones, ur: NEGATIVE mg/dL
Leukocytes, UA: NEGATIVE
Nitrite: NEGATIVE
pH: 5.5 (ref 5.0–8.0)

## 2012-12-19 MED ORDER — CELECOXIB 100 MG PO CAPS: 100.0000 mg | ORAL_CAPSULE | Freq: Two times a day (BID) | ORAL | Status: DC

## 2012-12-19 MED ORDER — HYDROCODONE-ACETAMINOPHEN 5-325 MG PO TABS
ORAL_TABLET | ORAL | Status: AC
  Filled 2012-12-19: qty 1

## 2012-12-19 MED ORDER — HYDROCODONE-ACETAMINOPHEN 5-325 MG PO TABS
1.0000 | ORAL_TABLET | Freq: Once | ORAL | Status: AC
  Administered 2012-12-19: 1 via ORAL

## 2012-12-19 MED ORDER — ACETAMINOPHEN 650 MG PO TABS: 1.0000 | ORAL_TABLET | Freq: Three times a day (TID) | ORAL | Status: AC | PRN

## 2012-12-19 MED ORDER — CYCLOBENZAPRINE HCL 5 MG PO TABS: 5.0000 mg | ORAL_TABLET | Freq: Every evening | ORAL | Status: DC | PRN

## 2012-12-19 MED ORDER — BENZONATATE 100 MG PO CAPS: 100.0000 mg | ORAL_CAPSULE | Freq: Three times a day (TID) | ORAL | Status: DC

## 2012-12-19 MED ORDER — FLUTICASONE PROPIONATE 50 MCG/ACT NA SUSP: 1.0000 | Freq: Every day | NASAL | Status: AC

## 2012-12-19 NOTE — ED Notes (Signed)
C/o L hip pain onset 1 week ago.  No known injury.  Getting up and down hurts worse than just walking on it.

## 2012-12-19 NOTE — ED Provider Notes (Signed)
History     CSN: [id number]  Arrival date & time 12/19/12  1604   First MD Initiated Contact with Patient 12/19/12 1646      Chief Complaint  Patient presents with  . Hip Pain    (Consider location/radiation/quality/duration/timing/severity/associated sxs/prior treatment) HPI Comments: 79 year old female with history of generalized joint disease, muscle spasm and diabetes among other comorbidities. Here complaining of left hip pain for one week. Patient remembers "mopping the kitchen floor before her pain started". Pain decreased when she is in a steady position and increases when she changes position like stabding standing from sitting and vice versa. Denies abdominal or flank pain. Denies urinary symptoms like frequency or burning. No fever or chills. She's otherwise doing well although states she has had bad allergies this year and has been sneezing and coughing nonproductive cough. She's not taking an antihistamine medications. Denies chest pain or shortness of breath. Denies productive cough. Appetite and activity level at baseline despite pain.states she's taking ibuprofen 600 mg 3 times a day with some improvement of her pain.   Past Medical History  Diagnosis Date  . DEGENERATIVE JOINT DISEASE, GENERALIZED 02/01/2007    Qualifier: Diagnosis of  By: Burnice Logan  MD, Doretha Sou   . DIARRHEA, CHRONIC 08/20/2009    Qualifier: Diagnosis of  By: Burnice Logan  MD, Doretha Sou   . DM 02/01/2007    Qualifier: Diagnosis of  By: Floyde Parkins    . FATIGUE 10/20/2008    Qualifier: Diagnosis of  By: Burnice Logan  MD, Doretha Sou   . HYPERCHOLESTEROLEMIA 02/01/2007    Qualifier: Diagnosis of  By: Floyde Parkins    . HYPERLIPIDEMIA 12/20/2007    Qualifier: Diagnosis of  By: Burnice Logan  MD, Doretha Sou   . HYPERTENSION 02/01/2007    Qualifier: Diagnosis of  By: Floyde Parkins    . LOW BACK PAIN 01/13/2010    Qualifier: Diagnosis of  By: Burnice Logan  MD, Doretha Sou MUSCLE CRAMPS 04/06/2010    Qualifier: Diagnosis of  By:  Burnice Logan  MD, Doretha Sou   . MYALGIA 01/20/2010    Qualifier: Diagnosis of  By: Elease Hashimoto MD, [place]    . OSTEOARTHRITIS 01/13/2010    Qualifier: Diagnosis of  By: Burnice Logan  MD, Doretha Sou   . OVERACTIVE BLADDER 08/20/2009    Qualifier: Diagnosis of  By: Burnice Logan  MD, Doretha Sou   . PLEURISY 06/18/2008    Qualifier: Diagnosis of  By: Niel Hummer MD, [place] PNEUMONIA 06/18/2008    Qualifier: Diagnosis of  By: Niel Hummer MD, [place] Rectal prolapse 09/20/2007    Qualifier: Diagnosis of  By: Burnice Logan  MD, Doretha Sou   . VITAMIN B12 DEFICIENCY 07/20/2010    Qualifier: Diagnosis of  By: Carlean Purl MD, Dimas Millin Asthma     Past Surgical History  Procedure Laterality Date  . Cholecystectomy    . Bladder surgery    . Cataract extraction Bilateral 2012  . Abdominal hysterectomy      partial- states she has her ovaries  . Appendectomy      Family History  Problem Relation Age of Onset  . Deep vein thrombosis Mother   . COPD Father   . Asthma Father   . Diabetes Neg Hx     family hx  . Cancer Neg Hx     breast ca - sister(S)?  Marland Kitchen Heart disease Neg Hx     family hx  History  Substance Use Topics  . Smoking status: Former Smoker    Quit date: 07/04/1938  . Smokeless tobacco: Never Used  . Alcohol Use: No    OB History   Grav Para Term Preterm Abortions TAB SAB Ect Mult Living                  Review of Systems  Constitutional: Negative for fever, chills, diaphoresis, appetite change and fatigue.  HENT: Positive for rhinorrhea and sneezing.   Respiratory: Positive for cough. Negative for shortness of breath and wheezing.   Cardiovascular: Negative for chest pain.  Gastrointestinal: Negative for nausea, vomiting, abdominal pain, diarrhea and constipation.  Genitourinary: Negative for dysuria, frequency and hematuria.  Musculoskeletal:       Left hip pain as per HPI  Skin: Negative for rash.  Neurological: Negative for dizziness and headaches.  All other  systems reviewed and are negative.    Allergies  Metformin and Sitagliptin phosphate  Home Medications   Current Outpatient Rx  Name  Route  Sig  Dispense  Refill  . aspirin 81 MG tablet   Oral   Take 81 mg by mouth daily.           . BD PEN NEEDLE NANO U/F 32G X 4 MM MISC      USE ONE AS DIRECTED  EVERY DAY AND AS NEEDED   100 each   2   . glimepiride (AMARYL) 4 MG tablet   Oral   Take 1 tablet (4 mg total) by mouth 2 (two) times daily.   180 tablet   4   . hydrochlorothiazide (MICROZIDE) 12.5 MG capsule   Oral   Take 1 capsule (12.5 mg total) by mouth every morning.   90 capsule   3   . Insulin Glargine (LANTUS SOLOSTAR) 100 UNIT/ML SOPN      Inject under skin 40 units at bedtime.   5 pen   3   . losartan (COZAAR) 100 MG tablet   Oral   Take 1 tablet (100 mg total) by mouth daily.   90 tablet   3   . metFORMIN (GLUCOPHAGE-XR) 500 MG 24 hr tablet   Oral   Take 1 tablet (500 mg total) by mouth 2 (two) times daily.   180 tablet   4   . Multiple Vitamin (MULTIVITAMIN) tablet   Oral   Take 1 tablet by mouth daily.           . ONE TOUCH ULTRA TEST test strip      USE TO TEST BLOOD SUGAR THREE TIMES DAILY AS NEEDED   100 each   3     Dx: 250.00   . simvastatin (ZOCOR) 40 MG tablet   Oral   Take 0.5 tablets (20 mg total) by mouth at bedtime. 1/2 tablet daily   45 tablet   3   . traZODone (DESYREL) 100 MG tablet   Oral   Take 1 tablet (100 mg total) by mouth at bedtime.   90 tablet   3   . venlafaxine XR (EFFEXOR-XR) 75 MG 24 hr capsule   Oral   Take 1 capsule (75 mg total) by mouth daily.   90 capsule   3   . Acetaminophen 650 MG TABS   Oral   Take 1 tablet (650 mg total) by mouth 3 (three) times daily as needed.   30 tablet   0   . albuterol (PROVENTIL,VENTOLIN) 90 MCG/ACT inhaler   Inhalation  Inhale 2 puffs into the lungs every 6 (six) hours as needed.   34 g   3   . benzonatate (TESSALON) 100 MG capsule   Oral   Take 1  capsule (100 mg total) by mouth every 8 (eight) hours.   21 capsule   0   . calcium-vitamin D (OSCAL WITH D 500-200) 500-200 MG-UNIT per tablet   Oral   Take 1 tablet by mouth daily.           . celecoxib (CELEBREX) 100 MG capsule   Oral   Take 1 capsule (100 mg total) by mouth 2 (two) times daily. Take with food   20 capsule   0   . cyanocobalamin 1000 MCG tablet   Oral   Take 100 mcg by mouth daily.           . cyclobenzaprine (FLEXERIL) 5 MG tablet   Oral   Take 1 tablet (5 mg total) by mouth at bedtime as needed for muscle spasms.   7 tablet   0   . fluticasone (FLONASE) 50 MCG/ACT nasal spray   Nasal   Place 1 spray into the nose daily.   16 g   0     BP 145/71  Pulse 78  Temp(Src) 98.2 F (36.8 C) (Oral)  Resp 19  SpO2 95%  Physical Exam  Nursing note and vitals reviewed. Constitutional: She is oriented to person, place, and time. She appears well-developed and well-nourished. No distress.  HENT:  Head: Normocephalic and atraumatic.  Nasal Congestion with erythema and swelling of nasal turbinates, clear rhinorrhea. No  pharyngeal erythema no exudates. No uvula deviation. No trismus. TM's normal  Eyes: Conjunctivae and EOM are normal. Pupils are equal, round, and reactive to light. No scleral icterus.  Neck: Neck supple. No JVD present.  Cardiovascular: Normal rate, regular rhythm and normal heart sounds.   Pulmonary/Chest: Effort normal and breath sounds normal. No respiratory distress. She has no wheezes. She has no rales. She exhibits no tenderness.  Abdominal: Soft. Bowel sounds are normal. She exhibits no distension and no mass. There is no tenderness. There is no rebound and no guarding.  No CVT  Musculoskeletal:  Left hip: no erythema swelling or deformity. Tenderness to palpation diffusely over sacro-illiac and gluteal area.  No crepitus or clunks with internal or external hip rotation. Pain exacerbated by the act of sitting or standing from  sitting position.  Also reported tenderness with palpation over lower paravertebral muscle on left side. entire left lower extremity appears neurovascularly intact  Neurological: She is alert and oriented to person, place, and time.  Skin: No rash noted. She is not diaphoretic.    ED Course  Procedures (including critical care time)  Labs Reviewed  POCT URINALYSIS DIP (DEVICE) - Abnormal; Notable for the following:    Hgb urine dipstick TRACE (*)    Protein, ur 100 (*)    All other components within normal limits   Dg Hip Complete Left  12/19/2012   *RADIOLOGY REPORT*  Clinical Data: 79 year old female with left hip pain radiating posteriorly.  Denies recent fall.  LEFT HIP - COMPLETE 2+ VIEW  Comparison: CT abdomen and pelvis 04/09/2010.  Findings: Femoral heads normally located.  Joint spaces appear stable and within normal limits.  Pelvis stable and intact.  Sacral ala within normal limits.  Proximal left femur intact.  IMPRESSION: No acute osseous abnormality identified about the left hip or pelvis.   Original Report Authenticated By: Genevie Ann  III, M.D.     1. Hip strain, left, initial encounter       MDM  Patient was treated here with norco 325/5mg  oral x1. Prescribed celecoxib, flexeril qhs and acetaminophen. Also prescribed tessalon pearls and refilled flonase.  Asked to follow up with her PCP to monitor her symptoms.         Randa Spike, MD 12/20/12 [id number]

## 2012-12-19 NOTE — Telephone Encounter (Signed)
Patient Information:  Caller Name: Almadelia  Phone: [phone]  Patient: Elodia, Rochefort  Gender: Female  DOB: 1928-11-28  Age: 79 Years  PCP: Bluford Kaufmann [facility])  Office Follow Up:  Does the office need to follow up with this patient?: No  Instructions For The Office: Pt is going to UC for severe hip pain. Offerred to send note to see if pt could be worked in. States she'll just go on to UC, [facility] on [address].   Symptoms  Reason For Call & Symptoms: Pt calling regarding left hip pain. No injury. Hurting for a week now.  Reviewed Health History In EMR: Yes  Reviewed Medications In EMR: Yes  Reviewed Allergies In EMR: Yes  Reviewed Surgeries / Procedures: Yes  Date of Onset of Symptoms: 12/12/2012  Treatments Tried: Ibuprofen, Heat  Treatments Tried Worked: No  Guideline(s) Used:  Hip Injury  Disposition Per Guideline:   Go to ED Now (or to Office with PCP Approval)  Reason For Disposition Reached:   Severe pain (e.g., excruciating)  Advice Given:  N/A  Patient Will Follow Care Advice:  YES

## 2013-01-16 ENCOUNTER — Other Ambulatory Visit: Payer: Self-pay | Admitting: Internal Medicine

## 2013-01-25 ENCOUNTER — Other Ambulatory Visit: Payer: Self-pay | Admitting: Internal Medicine

## 2013-02-08 ENCOUNTER — Encounter: Payer: Self-pay | Admitting: Internal Medicine

## 2013-02-08 ENCOUNTER — Ambulatory Visit (INDEPENDENT_AMBULATORY_CARE_PROVIDER_SITE_OTHER): Payer: Medicare HMO | Admitting: Internal Medicine

## 2013-02-08 ENCOUNTER — Telehealth: Payer: Self-pay | Admitting: Internal Medicine

## 2013-02-08 VITALS — BP 118/70 | HR 72 | Temp 98.8°F | Resp 20 | Wt 181.0 lb

## 2013-02-08 DIAGNOSIS — I10 Essential (primary) hypertension: Secondary | ICD-10-CM

## 2013-02-08 DIAGNOSIS — R0602 Shortness of breath: Secondary | ICD-10-CM

## 2013-02-08 DIAGNOSIS — R5383 Other fatigue: Secondary | ICD-10-CM

## 2013-02-08 DIAGNOSIS — R5381 Other malaise: Secondary | ICD-10-CM

## 2013-02-08 DIAGNOSIS — I499 Cardiac arrhythmia, unspecified: Secondary | ICD-10-CM

## 2013-02-08 DIAGNOSIS — M1990 Unspecified osteoarthritis, unspecified site: Secondary | ICD-10-CM

## 2013-02-08 NOTE — Telephone Encounter (Signed)
Pt called by scheduling and given appointment at 4:00 pm today per Dr. Burnice Logan.

## 2013-02-08 NOTE — Telephone Encounter (Signed)
Patient Information:  Caller Name: Tauri  Phone: [phone]  Patient: [name] [name], [name] [name]  Gender: Female  DOB: 1928/09/17  Age: 79 Years  PCP: Bluford Kaufmann [facility])  Office Follow Up:  Does the office need to follow up with this patient?: Yes  Instructions For The Office: OFFICE PLEASE REVIEW AND CALL PT BACK SINCE PT IS WANTING AN OFFICE APPT INSTEAD OF ED  RN Note:  Pt requesitng OV appt instead of ED  Symptoms  Reason For Call & Symptoms: Pt is calling and states that fire dept took BP on 02/06/13 and it was  150/82; she was also instructed that she has an irregular heart beat and heart was "skipping beats;"  she states that she feels nervous and shaky; no chest pain; no SOB; feels tired and worn out for the last couple of weeks; feels SOB with activity but not at rest; BP 144/65; P 76 at present time  Reviewed Health History In EMR: Yes  Reviewed Medications In EMR: Yes  Reviewed Allergies In EMR: Yes  Reviewed Surgeries / Procedures: Yes  Date of Onset of Symptoms: 01/25/2013  Guideline(s) Used:  Heart Rate and Heartbeat Questions  Disposition Per Guideline:   Go to ED Now (or to Office with PCP Approval)  Reason For Disposition Reached:   New or worsened shortness of breath with activity (dyspnea on exertion)  Advice Given:  Call Back If:  Chest pain, lightheadedness, or difficulty breathing occurs  You become worse.  Patient Will Follow Care Advice:  YES

## 2013-02-08 NOTE — Patient Instructions (Addendum)
Limit your sodium (Salt) intake    It is important that you exercise regularly, at least 20 minutes 3 to 4 times per week.  If you develop chest pain or shortness of breath seek  medical attention.   Please check your hemoglobin A1c every 3 months   

## 2013-02-08 NOTE — Progress Notes (Signed)
Subjective:    Patient ID: [name] [name], female    DOB: 1929-07-04, 79 y.o.   MRN: [id number]  HPI  79 year old patient who is seen today for followup. Stable medical problems include hypertension and diabetes. She is followed by endocrinology. Her most recent hemoglobin A1c is controlled at 6.7. She apparently had her blood pressure checked recently and was told she had an irregular heart rhythm and this is what prompted her visit today. She has no cardiopulmonary complaints.  Past Medical History  Diagnosis Date  . DEGENERATIVE JOINT DISEASE, GENERALIZED 02/01/2007    Qualifier: Diagnosis of  By: Burnice Logan  MD, Doretha Sou   . DIARRHEA, CHRONIC 08/20/2009    Qualifier: Diagnosis of  By: Burnice Logan  MD, Doretha Sou   . DM 02/01/2007    Qualifier: Diagnosis of  By: Floyde Parkins    . FATIGUE 10/20/2008    Qualifier: Diagnosis of  By: Burnice Logan  MD, Doretha Sou   . HYPERCHOLESTEROLEMIA 02/01/2007    Qualifier: Diagnosis of  By: Floyde Parkins    . HYPERLIPIDEMIA 12/20/2007    Qualifier: Diagnosis of  By: Burnice Logan  MD, Doretha Sou   . HYPERTENSION 02/01/2007    Qualifier: Diagnosis of  By: Floyde Parkins    . LOW BACK PAIN 01/13/2010    Qualifier: Diagnosis of  By: Burnice Logan  MD, Doretha Sou MUSCLE CRAMPS 04/06/2010    Qualifier: Diagnosis of  By: Burnice Logan  MD, Doretha Sou   . MYALGIA 01/20/2010    Qualifier: Diagnosis of  By: Elease Hashimoto MD, Bruce    . OSTEOARTHRITIS 01/13/2010    Qualifier: Diagnosis of  By: Burnice Logan  MD, Doretha Sou   . OVERACTIVE BLADDER 08/20/2009    Qualifier: Diagnosis of  By: Burnice Logan  MD, Doretha Sou   . PLEURISY 06/18/2008    Qualifier: Diagnosis of  By: Niel Hummer MD, [place] PNEUMONIA 06/18/2008    Qualifier: Diagnosis of  By: Niel Hummer MD, [place] Rectal prolapse 09/20/2007    Qualifier: Diagnosis of  By: Burnice Logan  MD, Doretha Sou   . VITAMIN B12 DEFICIENCY 07/20/2010    Qualifier: Diagnosis of  By: Carlean Purl MD, [place] Asthma     History   Social  History  . Marital Status: Single    Spouse Name: N/A    Number of Children: N/A  . Years of Education: N/A   Occupational History  . Not on file.   Social History Main Topics  . Smoking status: Former Smoker    Quit date: 07/04/1938  . Smokeless tobacco: Never Used  . Alcohol Use: No  . Drug Use: No  . Sexually Active: Not on file   Other Topics Concern  . Not on file   Social History Narrative  . No narrative on file    Past Surgical History  Procedure Laterality Date  . Cholecystectomy    . Bladder surgery    . Cataract extraction Bilateral 2012  . Abdominal hysterectomy      partial- states she has her ovaries  . Appendectomy      Family History  Problem Relation Age of Onset  . Deep vein thrombosis Mother   . COPD Father   . Asthma Father   . Diabetes Neg Hx     family hx  . Cancer Neg Hx     breast ca - sister(S)?  Marland Kitchen Heart disease Neg Hx     family  hx    Allergies  Allergen Reactions  . Metformin     REACTION: diarrhea  . Sitagliptin Phosphate     REACTION: abdominal pain    Current Outpatient Prescriptions on File Prior to Visit  Medication Sig Dispense Refill  . Acetaminophen 650 MG TABS Take 1 tablet (650 mg total) by mouth 3 (three) times daily as needed.  30 tablet  0  . albuterol (PROVENTIL,VENTOLIN) 90 MCG/ACT inhaler Inhale 2 puffs into the lungs every 6 (six) hours as needed.  34 g  3  . aspirin 81 MG tablet Take 81 mg by mouth daily.        . BD PEN NEEDLE NANO U/F 32G X 4 MM MISC USE ONE AS DIRECTED  EVERY DAY AND AS NEEDED  100 each  2  . benzonatate (TESSALON) 100 MG capsule Take 1 capsule (100 mg total) by mouth every 8 (eight) hours.  21 capsule  0  . calcium-vitamin D (OSCAL WITH D 500-200) 500-200 MG-UNIT per tablet Take 1 tablet by mouth daily.        . celecoxib (CELEBREX) 100 MG capsule Take 1 capsule (100 mg total) by mouth 2 (two) times daily. Take with food  20 capsule  0  . cyanocobalamin 1000 MCG tablet Take 100 mcg by  mouth daily.        . cyclobenzaprine (FLEXERIL) 5 MG tablet Take 1 tablet (5 mg total) by mouth at bedtime as needed for muscle spasms.  7 tablet  0  . fluticasone (FLONASE) 50 MCG/ACT nasal spray Place 1 spray into the nose daily.  16 g  0  . glimepiride (AMARYL) 4 MG tablet Take 1 tablet (4 mg total) by mouth 2 (two) times daily.  180 tablet  4  . hydrochlorothiazide (MICROZIDE) 12.5 MG capsule Take 1 capsule (12.5 mg total) by mouth every morning.  90 capsule  3  . Insulin Glargine (LANTUS SOLOSTAR) 100 UNIT/ML SOPN Inject under skin 40 units at bedtime.  5 pen  3  . LANTUS SOLOSTAR 100 UNIT/ML SOPN INJECT 56 UNITS SUBCUTANEOUSLY AT BEDTIME  45 pen  1  . losartan (COZAAR) 100 MG tablet Take 1 tablet (100 mg total) by mouth daily.  90 tablet  3  . Multiple Vitamin (MULTIVITAMIN) tablet Take 1 tablet by mouth daily.        . ONE TOUCH ULTRA TEST test strip USE TO TEST BLOOD SUGAR THREE TIMES DAILY AS NEEDED  100 each  3  . simvastatin (ZOCOR) 40 MG tablet Take 0.5 tablets (20 mg total) by mouth at bedtime. 1/2 tablet daily  45 tablet  3  . traZODone (DESYREL) 100 MG tablet TAKE ONE TABLET BY MOUTH AT BEDTIME  90 tablet  1  . venlafaxine XR (EFFEXOR-XR) 75 MG 24 hr capsule Take 1 capsule (75 mg total) by mouth daily.  90 capsule  3   No current facility-administered medications on file prior to visit.    BP 118/70  Pulse 72  Temp(Src) 98.8 F (37.1 C) (Oral)  Resp 20  Wt 181 lb (82.101 kg)  BMI 31.56 kg/m2  SpO2 95%       Review of Systems  Constitutional: Negative.   HENT: Negative for hearing loss, congestion, sore throat, rhinorrhea, dental problem, sinus pressure and tinnitus.   Eyes: Negative for pain, discharge and visual disturbance.  Respiratory: Negative for cough and shortness of breath.   Cardiovascular: Negative for chest pain, palpitations and leg swelling.  Gastrointestinal: Negative for nausea,  vomiting, abdominal pain, diarrhea, constipation, blood in stool and  abdominal distention.  Genitourinary: Negative for dysuria, urgency, frequency, hematuria, flank pain, vaginal bleeding, vaginal discharge, difficulty urinating, vaginal pain and pelvic pain.  Musculoskeletal: Positive for back pain and arthralgias. Negative for joint swelling and gait problem.  Skin: Negative for rash.  Neurological: Negative for dizziness, syncope, speech difficulty, weakness, numbness and headaches.  Hematological: Negative for adenopathy.  Psychiatric/Behavioral: Negative for behavioral problems, dysphoric mood and agitation. The patient is not nervous/anxious.        Objective:   Physical Exam  Constitutional: She is oriented to person, place, and time. She appears well-developed and well-nourished.  HENT:  Head: Normocephalic.  Right Ear: External ear normal.  Left Ear: External ear normal.  Mouth/Throat: Oropharynx is clear and moist.  Eyes: Conjunctivae and EOM are normal. Pupils are equal, round, and reactive to light.  Neck: Normal range of motion. Neck supple. No thyromegaly present.  Left carotid bruit  Cardiovascular: Normal rate, regular rhythm, normal heart sounds and intact distal pulses.   No ectopics  Pulmonary/Chest: Effort normal and breath sounds normal.  Abdominal: Soft. Bowel sounds are normal. She exhibits no mass. There is no tenderness.  Musculoskeletal: Normal range of motion.  Lymphadenopathy:    She has no cervical adenopathy.  Neurological: She is alert and oriented to person, place, and time.  Skin: Skin is warm and dry. No rash noted.  Psychiatric: She has a normal mood and affect. Her behavior is normal.          Assessment & Plan:   EKG was reviewed and revealed a single PAC. Hypertension stable Diabetes mellitus improved Osteoarthritis  Patient was reassured Return in 3 months for followup Endocrinology followup

## 2013-03-08 ENCOUNTER — Encounter (INDEPENDENT_AMBULATORY_CARE_PROVIDER_SITE_OTHER): Payer: Medicare HMO

## 2013-03-08 DIAGNOSIS — I6529 Occlusion and stenosis of unspecified carotid artery: Secondary | ICD-10-CM

## 2013-03-10 ENCOUNTER — Other Ambulatory Visit: Payer: Self-pay | Admitting: Internal Medicine

## 2013-03-18 ENCOUNTER — Other Ambulatory Visit: Payer: Self-pay | Admitting: Internal Medicine

## 2013-03-20 ENCOUNTER — Other Ambulatory Visit: Payer: Self-pay | Admitting: Internal Medicine

## 2013-03-21 ENCOUNTER — Other Ambulatory Visit: Payer: Self-pay | Admitting: Internal Medicine

## 2013-04-18 ENCOUNTER — Ambulatory Visit (INDEPENDENT_AMBULATORY_CARE_PROVIDER_SITE_OTHER): Payer: Medicare HMO | Admitting: Internal Medicine

## 2013-04-18 ENCOUNTER — Encounter: Payer: Self-pay | Admitting: Internal Medicine

## 2013-04-18 VITALS — BP 122/80 | HR 75 | Temp 98.1°F | Resp 10 | Wt 178.0 lb

## 2013-04-18 DIAGNOSIS — E119 Type 2 diabetes mellitus without complications: Secondary | ICD-10-CM

## 2013-04-18 LAB — BASIC METABOLIC PANEL
BUN: 29 mg/dL — ABNORMAL HIGH (ref 6–23)
CO2: 28 mEq/L (ref 19–32)
Chloride: 102 mEq/L (ref 96–112)
Creatinine, Ser: 1.6 mg/dL — ABNORMAL HIGH (ref 0.4–1.2)
Glucose, Bld: 134 mg/dL — ABNORMAL HIGH (ref 70–99)

## 2013-04-18 NOTE — Progress Notes (Signed)
Patient ID: NYJAI HESKETH, female   DOB: [birth year], 79 y.o.   MRN: [id number]  HPI: [name] [name] is a 79 y.o.-year-old female, returning for f/u for DM2, dx 2005, insulin-dependent, uncontrolled, without complications. Last visit 5 mo ago.   Last hemoglobin A1c was: Lab Results  Component Value Date   HGBA1C 6.7* 11/22/2012  previously 9.6%, previously 8.8% 2 years ago.  Pt is on a regimen of: - Lantus 40 units qhs - injects in the abdomen mostly sometimes in thighs, no problems with the injection sites - Metformin XR 500 mg bid - Amaryl 4 mg bid   Pt checks her sugars 1-3 x a day: - am: 62-122 >> 64-122 - lunch: 170 - later: ~2h after dinner: 200s >> 150-200 >> 134-234, most 130-170s Had several lows, lower than 80, in a.m. Lowest sugar was 62. She has hypoglycemia awareness around 70. Highest 299, at night.  Pt has chronic kidney disease stage 3-4, last BUN/creatinine was:  Lab Results  Component Value Date   BUN 23 01/31/2012   CREATININE 1.3* 01/31/2012   Last set of lipids: Lab Results  Component Value Date   CHOL 182 01/31/2012   HDL 48.60 01/31/2012   LDLDIRECT 85.2 01/31/2012   TRIG 320.0* 01/31/2012   CHOLHDL 4 01/31/2012   Pt's last eye exam was in 08/24/12, with Dr. Susa Loffler at [facility] ophthalmology. No DR.  Denies numbness and tingling in her legs.  PMH: I reviewed her chart and she also has B12 def - takes B12 2500 mcg daily, hypertension, hyperlipidemia, low back pain, DJD, overactive bladder, chronic diarrhea, bilateral hip pain. Last TSH: Lab Results  Component Value Date   TSH 1.21 01/31/2012   ROS: Constitutional: no weight gain/loss, no fatigue, no subjective hyperthermia/hypothermia Eyes: no blurry vision, no xerophthalmia ENT: no sore throat, no nodules palpated in throat, no dysphagia/odynophagia, no hoarseness Cardiovascular: no CP/SOB/palpitations/leg swelling Respiratory: + cough/no SOB/+wheezing Gastrointestinal: no N/V/had  D/noC Musculoskeletal: no muscle/joint aches Skin: no rashes Neurological: no tremors/numbness/tingling/dizziness  I reviewed pt's medications, allergies, PMH, social hx, family hx and no changes required, except as mentioned above.  PE: BP 122/80  Pulse 75  Temp(Src) 98.1 F (36.7 C) (Oral)  Resp 10  Wt 178 lb (80.74 kg)  BMI 31.03 kg/m2  SpO2 96% Wt Readings from Last 3 Encounters:  04/18/13 178 lb (80.74 kg)  02/08/13 181 lb (82.101 kg)  11/27/12 182 lb (82.555 kg)   Constitutional: overweight, in NAD Eyes: PERRLA, EOMI, no exophthalmos ENT: moist mucous membranes, no thyromegaly, no cervical lymphadenopathy Cardiovascular: RRR, SEM 2/6, no RG Respiratory: CTA B Gastrointestinal: abdomen soft, NT, ND, BS+ Musculoskeletal: no deformities, strength intact in all 4 Skin: moist, warm, no rashes  ASSESSMENT: 1. DM2, insulin-dependent, uncontrolled, without complications - She does have kidney dysfunction, but no diabetic retinopathy, so I doubt that her kidney dysfunction is due to diabetes  PLAN:  1. Patient with long-standing diabetes, with no apparent complications. Her hemoglobin A1c has decreased to 6.7% at last check, 5 months ago. She did not return in 3 months as she misunderstood the instructions.  - sugars are similar to last visit, 2 60's and I advised ehr to let me know if she has more >> will decrease the Lantus then - continue Lantus at 40 units. - continue Amaryl 4 mg bid - continue metformin XR, 500 twice a day - given new sugar log and advised how to fill it and to bring it at next appt,  she will need to check twice a day - advised her to also check before lunch - Will check a hemoglobin A1c and BMP today - given flu vaccine - I will see her back in 3 months with her sugar log  Office Visit on 04/18/2013  Component Date Value Range Status  . Hemoglobin A1C 04/18/2013 6.7* 4.6 - 6.5 % Final   Glycemic Control Guidelines for People with Diabetes:Non  Diabetic:  <6%Goal of Therapy: <7%Additional Action Suggested:  >8%   . Sodium 04/18/2013 138  135 - 145 mEq/L Final  . Potassium 04/18/2013 4.8  3.5 - 5.1 mEq/L Final  . Chloride 04/18/2013 102  96 - 112 mEq/L Final  . CO2 04/18/2013 28  19 - 32 mEq/L Final  . Glucose, Bld 04/18/2013 134* 70 - 99 mg/dL Final  . BUN 04/18/2013 29* 6 - 23 mg/dL Final  . Creatinine, Ser 04/18/2013 1.6* 0.4 - 1.2 mg/dL Final  . Calcium 04/18/2013 9.5  8.4 - 10.5 mg/dL Final  . GFR 04/18/2013 32.12* >60.00 mL/min Final   Great hemoglobin A1c, unfortunately we need to stop the metformin, due to creatinine of 1.6. Continue the same insulin-Amaryl regimen.

## 2013-04-18 NOTE — Patient Instructions (Signed)
Please return in 3 months.  Continue current regimen for now. Call me if sugars <70.

## 2013-05-27 ENCOUNTER — Other Ambulatory Visit: Payer: Self-pay | Admitting: Internal Medicine

## 2013-06-12 ENCOUNTER — Emergency Department (INDEPENDENT_AMBULATORY_CARE_PROVIDER_SITE_OTHER): Payer: Medicare HMO

## 2013-06-12 ENCOUNTER — Emergency Department (HOSPITAL_COMMUNITY)
Admission: EM | Admit: 2013-06-12 | Discharge: 2013-06-12 | Disposition: A | Discharge status: Home / Self Care | Payer: Medicare HMO | Source: Home / Self Care | Attending: Family Medicine | Admitting: Family Medicine

## 2013-06-12 ENCOUNTER — Encounter (HOSPITAL_COMMUNITY): Payer: Self-pay | Admitting: Emergency Medicine

## 2013-06-12 ENCOUNTER — Telehealth: Payer: Self-pay | Admitting: Internal Medicine

## 2013-06-12 DIAGNOSIS — J189 Pneumonia, unspecified organism: Secondary | ICD-10-CM

## 2013-06-12 MED ORDER — ALBUTEROL SULFATE (5 MG/ML) 0.5% IN NEBU
5.0000 mg | INHALATION_SOLUTION | Freq: Once | RESPIRATORY_TRACT | Status: AC
  Administered 2013-06-12: 5 mg via RESPIRATORY_TRACT

## 2013-06-12 MED ORDER — SODIUM CHLORIDE 0.9 % IN NEBU
INHALATION_SOLUTION | RESPIRATORY_TRACT | Status: AC
  Filled 2013-06-12: qty 6

## 2013-06-12 MED ORDER — IPRATROPIUM BROMIDE 0.02 % IN SOLN
0.5000 mg | Freq: Once | RESPIRATORY_TRACT | Status: AC
  Administered 2013-06-12: 0.5 mg via RESPIRATORY_TRACT

## 2013-06-12 MED ORDER — LEVOFLOXACIN 250 MG PO TABS: 250.0000 mg | ORAL_TABLET | Freq: Every day | ORAL | Status: DC

## 2013-06-12 MED ORDER — GUAIFENESIN-CODEINE 100-10 MG/5ML PO SOLN: 5.0000 mL | Freq: Every evening | ORAL | Status: DC | PRN

## 2013-06-12 MED ORDER — IPRATROPIUM BROMIDE 0.02 % IN SOLN
RESPIRATORY_TRACT | Status: AC
  Filled 2013-06-12: qty 2.5

## 2013-06-12 MED ORDER — ALBUTEROL SULFATE (5 MG/ML) 0.5% IN NEBU
INHALATION_SOLUTION | RESPIRATORY_TRACT | Status: AC
  Filled 2013-06-12: qty 1

## 2013-06-12 NOTE — ED Provider Notes (Signed)
[name] [name] is a 79 y.o. female who presents to Urgent Care today for of nonproductive cough subjective fever, and occasional shortness of breath with deep coughing. Additionally she notes bilateral rib pain worse with deep coughing. She denies any central or radiating exertional chest pain. She denies any nausea vomiting or diarrhea. She's tried several over-the-counter medications which have not been very effective. She does have remote history of asthma but denies any wheezing.  She feels well otherwise.   Past Medical History  Diagnosis Date  . DEGENERATIVE JOINT DISEASE, GENERALIZED 02/01/2007    Qualifier: Diagnosis of  By: Burnice Logan  MD, Doretha Sou   . DIARRHEA, CHRONIC 08/20/2009    Qualifier: Diagnosis of  By: Burnice Logan  MD, Doretha Sou   . DM 02/01/2007    Qualifier: Diagnosis of  By: Floyde Parkins    . FATIGUE 10/20/2008    Qualifier: Diagnosis of  By: Burnice Logan  MD, Doretha Sou   . HYPERCHOLESTEROLEMIA 02/01/2007    Qualifier: Diagnosis of  By: Floyde Parkins    . HYPERLIPIDEMIA 12/20/2007    Qualifier: Diagnosis of  By: Burnice Logan  MD, Doretha Sou   . HYPERTENSION 02/01/2007    Qualifier: Diagnosis of  By: Floyde Parkins    . LOW BACK PAIN 01/13/2010    Qualifier: Diagnosis of  By: Burnice Logan  MD, Doretha Sou MUSCLE CRAMPS 04/06/2010    Qualifier: Diagnosis of  By: Burnice Logan  MD, Doretha Sou   . MYALGIA 01/20/2010    Qualifier: Diagnosis of  By: Elease Hashimoto MD, [place]    . OSTEOARTHRITIS 01/13/2010    Qualifier: Diagnosis of  By: Burnice Logan  MD, Doretha Sou   . OVERACTIVE BLADDER 08/20/2009    Qualifier: Diagnosis of  By: Burnice Logan  MD, Doretha Sou   . PLEURISY 06/18/2008    Qualifier: Diagnosis of  By: Niel Hummer MD, [place] PNEUMONIA 06/18/2008    Qualifier: Diagnosis of  By: Niel Hummer MD, [place] Rectal prolapse 09/20/2007    Qualifier: Diagnosis of  By: Burnice Logan  MD, Doretha Sou   . VITAMIN B12 DEFICIENCY 07/20/2010    Qualifier: Diagnosis of  By: Carlean Purl MD, [place] Mill[place] Asthma     History  Substance Use Topics  . Smoking status: Former Smoker    Quit date: 07/04/1938  . Smokeless tobacco: Never Used  . Alcohol Use: No   ROS as above Medications reviewed. No current facility-administered medications for this encounter.   Current Outpatient Prescriptions  Medication Sig Dispense Refill  . Acetaminophen 650 MG TABS Take 1 tablet (650 mg total) by mouth 3 (three) times daily as needed.  30 tablet  0  . albuterol (PROVENTIL,VENTOLIN) 90 MCG/ACT inhaler Inhale 2 puffs into the lungs every 6 (six) hours as needed.  34 g  3  . aspirin 81 MG tablet Take 81 mg by mouth daily.        . BD PEN NEEDLE NANO U/F 32G X 4 MM MISC USE ONE AS DIRECTED  EVERY DAY AND AS NEEDED  100 each  2  . benzonatate (TESSALON) 100 MG capsule Take 1 capsule (100 mg total) by mouth every 8 (eight) hours.  21 capsule  0  . calcium-vitamin D (OSCAL WITH D 500-200) 500-200 MG-UNIT per tablet Take 1 tablet by mouth daily.        . celecoxib (CELEBREX) 100 MG capsule Take 1 capsule (100 mg total) by mouth 2 (two) times  daily. Take with food  20 capsule  0  . cyanocobalamin 1000 MCG tablet Take 100 mcg by mouth daily.        . cyclobenzaprine (FLEXERIL) 5 MG tablet Take 1 tablet (5 mg total) by mouth at bedtime as needed for muscle spasms.  7 tablet  0  . fluticasone (FLONASE) 50 MCG/ACT nasal spray Place 1 spray into the nose daily.  16 g  0  . glimepiride (AMARYL) 4 MG tablet TAKE ONE TABLET BY MOUTH TWICE DAILY  180 tablet  0  . guaiFENesin-codeine 100-10 MG/5ML syrup Take 5 mLs by mouth at bedtime as needed for cough.  120 mL  0  . hydrochlorothiazide (MICROZIDE) 12.5 MG capsule Take 1 capsule (12.5 mg total) by mouth every morning.  90 capsule  3  . HYDROcodone-acetaminophen (NORCO/VICODIN) 5-325 MG per tablet TAKE ONE TABLET BY MOUTH EVERY 6 HOURS AS NEEDED FOR PAIN  90 tablet  2  . ibuprofen (ADVIL,MOTRIN) 600 MG tablet TAKE ONE TABLET BY MOUTH EVERY 6 HOURS AS NEEDED FOR PAIN  60 tablet  0  .  Insulin Glargine (LANTUS SOLOSTAR) 100 UNIT/ML SOPN Inject under skin 40 units at bedtime.  5 pen  3  . LANTUS SOLOSTAR 100 UNIT/ML SOPN INJECT 56 UNITS SUBCUTANEOUSLY AT BEDTIME  45 pen  1  . levofloxacin (LEVAQUIN) 250 MG tablet Take 1 tablet (250 mg total) by mouth daily.  7 tablet  0  . losartan (COZAAR) 100 MG tablet Take 1 tablet (100 mg total) by mouth daily.  90 tablet  3  . Multiple Vitamin (MULTIVITAMIN) tablet Take 1 tablet by mouth daily.        . ONE TOUCH ULTRA TEST test strip USE TO TEST BLOOD SUGAR THREE TIMES DAILY AS NEEDED  100 each  3  . simvastatin (ZOCOR) 40 MG tablet TAKE ONE-HALF TABLET BY MOUTH AT BEDTIME  45 tablet  0  . traZODone (DESYREL) 100 MG tablet TAKE ONE TABLET BY MOUTH AT BEDTIME  90 tablet  1  . venlafaxine XR (EFFEXOR-XR) 75 MG 24 hr capsule TAKE ONE CAPSULE BY MOUTH EVERY DAY  90 capsule  0    Exam:  BP 125/67  Pulse 86  Temp(Src) 100.3 F (37.9 C) (Oral)  Resp 20  SpO2 95% Gen: Well NAD HEENT: EOMI,  MMM Lungs: Normal work of breathing. CTABL. Frequent coughing Heart: RRR no MRG Abd: NABS, Soft. NT, ND Exts: Non edematous BL  LE, warm and well perfused.   Patient was given a DuoNeb nebulizer treatment and had no improvement in symptoms.  No results found for this or any previous visit (from the past 24 hour(s)). Dg Chest 2 View  06/12/2013   CLINICAL DATA:  Cough for 4 days, history of diabetes and hypertension  EXAM: CHEST  2 VIEW  COMPARISON:  05/30/2010; 05/17/2010; 06/08/2008  FINDINGS: Grossly unchanged cardiac silhouette and mediastinal contours with mild tortuosity of the thoracic aorta. There is persistent elevation of the right hemidiaphragm with associated right perihilar heterogeneous opacities. No new focal airspace opacities. No pleural effusion or pneumothorax. No definite evidence of edema. Grossly unchanged bones including mild (< 25%) compression deformity of a lower thoracic vertebral body with associated focal kyphosis at this  level. Post cholecystectomy.  IMPRESSION: 1.  No acute cardiopulmonary disease. 2. Chronic elevation of the right hemidiaphragm with associated right perihilar atelectasis.   Electronically Signed   By: Sandi Mariscal M.D.   On: 06/12/2013 16:20    Assessment and Plan:  79 y.o. female with probable community-acquired pneumonia. This is based on atelectasis with fever and cough. Plan to treat with Levaquin. Will use 250 mg daily based on patient's decreased GFR.  Additional use codeine containing cough medication. Recommend patient followup with her primary care provider soon.  Discussed warning signs or symptoms. Please see discharge instructions. Patient expresses understanding.      Gregor Hams, MD 06/12/13 [phone]

## 2013-06-12 NOTE — Telephone Encounter (Signed)
Per Vaughan Sine, RN  Patient Information: Caller Name: Quinlynn Phone: [phone] Patient: [name] [name], Popovich Gender: Female DOB: 01-17-1929 Age: 79 Years PCP: Bluford Kaufmann [facility])  Office Follow Up: Does the office need to follow up with this patient?: No Instructions For The Office: N/A  RN Note: Cough, Left Rib discomfort from Coughing, SOB w/ Hx of Asthma, Runny Nose, Bodyaches and Headache.  No same day appts, consulted w/ Hollace Kinnier, advised Pt to go to UC near [facility] d/t [place], constant Cough w/ Left Rib pain.  Symptoms Reason For Call & Symptoms: Cough, Left Rib discomfort from Coughing, SOB w/ Hx of Asthma, Runny Nose, Bodyaches and Headache. Reviewed Health History In EMR: Yes Reviewed Medications In EMR: Yes Reviewed Allergies In EMR: Yes Reviewed Surgeries / Procedures: Yes Date of Onset of Symptoms: 06/05/2013 Treatments Tried: Ibuprofen 600mg  taken prior to call, Inhaler, uncertain of name Treatments Tried Worked: No  Guideline(s) Used: Asthma Attack  Disposition Per Guideline:   See Today in Office  Reason For Disposition Reached:   Coughing continuously (nonstop) that keeps from working or sleeping, and not improved after inhaler or nebulizer  Advice Given: N/A  Patient Will Follow Care Advice: YES

## 2013-06-12 NOTE — ED Notes (Signed)
Pt c/o persistent dry cough onset 3-4 days Rib cage hurting due to cough Denies: f/v/n/d, cold sxs, SOB, wheezing Alert w/no signs of acute distress.

## 2013-06-15 ENCOUNTER — Other Ambulatory Visit: Payer: Self-pay | Admitting: Internal Medicine

## 2013-06-16 ENCOUNTER — Other Ambulatory Visit: Payer: Self-pay | Admitting: Internal Medicine

## 2013-07-01 ENCOUNTER — Telehealth: Payer: Self-pay | Admitting: Internal Medicine

## 2013-07-01 MED ORDER — INSULIN GLARGINE 100 UNIT/ML SOLOSTAR PEN: PEN_INJECTOR | SUBCUTANEOUS | Status: DC

## 2013-07-01 NOTE — Telephone Encounter (Signed)
Refilled with a 3 month supply.

## 2013-07-01 NOTE — Telephone Encounter (Signed)
OK to call more in - for 3 mo.

## 2013-07-01 NOTE — Telephone Encounter (Signed)
[name] [name], can you plz call some in? Thank you!

## 2013-07-01 NOTE — Telephone Encounter (Signed)
Patient called stating she is out of samples for LANTUS SOLOSTAR And would like some called in because she is out -please advise

## 2013-07-22 ENCOUNTER — Ambulatory Visit: Payer: Medicare HMO | Admitting: Internal Medicine

## 2013-07-25 ENCOUNTER — Ambulatory Visit (INDEPENDENT_AMBULATORY_CARE_PROVIDER_SITE_OTHER): Payer: Medicare HMO | Admitting: Internal Medicine

## 2013-07-25 ENCOUNTER — Encounter: Payer: Self-pay | Admitting: Internal Medicine

## 2013-07-25 VITALS — BP 128/68 | HR 91 | Temp 98.2°F | Resp 16 | Wt 177.7 lb

## 2013-07-25 DIAGNOSIS — E119 Type 2 diabetes mellitus without complications: Secondary | ICD-10-CM

## 2013-07-25 LAB — HEMOGLOBIN A1C: HEMOGLOBIN A1C: 7.6 % — AB (ref 4.6–6.5)

## 2013-07-25 MED ORDER — INSULIN ASPART 100 UNIT/ML ~~LOC~~ SOLN: SUBCUTANEOUS | Status: DC

## 2013-07-25 NOTE — Patient Instructions (Signed)
Please continue the same regimen, but add NovoLog with a larger dinner: 6 units. Inject it under skin 15 minutes before a meal. Try to limit the size of your dinner.  Please return in 1.5 month with your sugar log.

## 2013-07-25 NOTE — Progress Notes (Signed)
Patient ID: [name] [name], female   DOB: 06-12-1929, 80 y.o.   MRN: [id number]  HPI: PORSHAY [name] is a 80 y.o.-year-old female, returning for f/u for DM2, dx 2005, insulin-dependent, uncontrolled, without complications. Last visit 1 mo ago.   At last visit, we had to stop Metformin 2/2 increased creatinine.  Last hemoglobin A1c was: Lab Results  Component Value Date   HGBA1C 6.7* 04/18/2013   HGBA1C 6.7* 11/22/2012   HGBA1C 7.1* 08/23/2012  previously 9.6%, previously 8.8% 2 years ago.  Pt is on a regimen of: - Lantus 40 units qhs - injects in the abdomen mostly sometimes in thighs, no problems with the injection sites - Amaryl 4 mg bid  We stopped Metformin XR 500 mg bid 2/2 CKD.  Pt checks her sugars 1-3 x a day: - am: 62-122 >> 64-122 >> 70-155 - lunch: 170 >> 168 - before dinner: 127-168 - later: 1-2h after dinner: 200s >> 150-200 >> 134-234, most 130-170s >> 140-266 Had several lows. Lowest sugar was 70. She has hypoglycemia awareness around 70. Highest 311, Christmas night.  Pt has chronic kidney disease stage 3-4, last BUN/creatinine was:  Lab Results  Component Value Date   BUN 29* 04/18/2013   CREATININE 1.6* 04/18/2013  On Losartan. Last set of lipids: Lab Results  Component Value Date   CHOL 182 01/31/2012   HDL 48.60 01/31/2012   LDLDIRECT 85.2 01/31/2012   TRIG 320.0* 01/31/2012   CHOLHDL 4 01/31/2012  On Zocor.  Pt's last eye exam was in 08/24/12, with Dr. Susa Loffler at [facility] ophthalmology. No DR.  Denies numbness and tingling in her legs.  ROS: Constitutional: no weight gain/loss, no fatigue, no subjective hyperthermia/hypothermia Eyes: no blurry vision, no xerophthalmia ENT: no sore throat, no nodules palpated in throat, no dysphagia/odynophagia, no hoarseness Cardiovascular: no CP/SOB/palpitations/leg swelling Respiratory: + cough/+ SOB/+wheezing - had PNA Gastrointestinal: no N/V/had D/noC Musculoskeletal: no muscle/joint  aches Skin: no rashes Neurological: no tremors/numbness/tingling/dizziness, + HA  I reviewed pt's medications, allergies, PMH, social hx, family hx and no changes required, except as mentioned above.  PE: BP 128/68  Pulse 91  Temp(Src) 98.2 F (36.8 C) (Oral)  Resp 16  Wt 177 lb 11.2 oz (80.604 kg)  SpO2 95% Wt Readings from Last 3 Encounters:  07/25/13 177 lb 11.2 oz (80.604 kg)  04/18/13 178 lb (80.74 kg)  02/08/13 181 lb (82.101 kg)   Constitutional: overweight, in NAD Eyes: PERRLA, EOMI, no exophthalmos ENT: moist mucous membranes, no thyromegaly, no cervical lymphadenopathy Cardiovascular: RRR, SEM 2/6, no RG Respiratory: CTA B Gastrointestinal: abdomen soft, NT, ND, BS+ Musculoskeletal: no deformities, strength intact in all 4 Skin: moist, warm, no rashes  ASSESSMENT: 1. DM2, insulin-dependent, uncontrolled, without complications - She does have kidney dysfunction, but no diabetic retinopathy, so I doubt that her kidney dysfunction is due to diabetes  PLAN:  1. Patient with long-standing diabetes, with no apparent complications. Her hemoglobin A1c has decreased to 6.7% at the last 2 checks. No significant lows. After stopping Metformin, sugars increased, but still OK controlled except after dinner  Patient Instructions  Please continue the same regimen, but add NovoLog with a larger dinner: 6 units. Inject it under skin 15 minutes before a meal. Try to limit the size of your dinner.  Please return in 1.5 month with your sugar log.  - discussed about limiting dinner size and grazing after dinner - Will check a hemoglobin A1c  - given flu vaccine at last visit -  I will see her back in 3 months with her sugar log  Office Visit on 07/25/2013  Component Date Value Range Status  . Hemoglobin A1C 07/25/2013 7.6* 4.6 - 6.5 % Final   Glycemic Control Guidelines for People with Diabetes:Non Diabetic:  <6%Goal of Therapy: <7%Additional Action Suggested:  >8%    HbA1c worse  >> see plan above.

## 2013-08-01 ENCOUNTER — Other Ambulatory Visit: Payer: Self-pay | Admitting: Internal Medicine

## 2013-08-06 ENCOUNTER — Other Ambulatory Visit: Payer: Self-pay | Admitting: Internal Medicine

## 2013-08-08 ENCOUNTER — Telehealth: Payer: Self-pay | Admitting: Internal Medicine

## 2013-08-08 MED ORDER — INSULIN GLARGINE 100 UNIT/ML SOLOSTAR PEN: PEN_INJECTOR | SUBCUTANEOUS | Status: DC

## 2013-08-08 NOTE — Telephone Encounter (Signed)
Rx sent to pharmacy   

## 2013-08-08 NOTE — Telephone Encounter (Signed)
[facility] [address] requesting refill of Insulin Glargine (LANTUS SOLOSTAR) 100 UNIT/ML SOPN

## 2013-08-12 ENCOUNTER — Other Ambulatory Visit: Payer: Self-pay | Admitting: Internal Medicine

## 2013-08-13 ENCOUNTER — Other Ambulatory Visit: Payer: Self-pay | Admitting: Internal Medicine

## 2013-08-28 LAB — HM DIABETES EYE EXAM

## 2013-08-30 ENCOUNTER — Encounter: Payer: Self-pay | Admitting: Internal Medicine

## 2013-09-20 ENCOUNTER — Ambulatory Visit (INDEPENDENT_AMBULATORY_CARE_PROVIDER_SITE_OTHER): Payer: Medicare HMO | Admitting: Internal Medicine

## 2013-09-20 ENCOUNTER — Encounter: Payer: Self-pay | Admitting: Internal Medicine

## 2013-09-20 VITALS — BP 128/70 | HR 78 | Temp 98.5°F | Resp 20 | Ht 63.5 in | Wt 184.0 lb

## 2013-09-20 DIAGNOSIS — I10 Essential (primary) hypertension: Secondary | ICD-10-CM

## 2013-09-20 DIAGNOSIS — E119 Type 2 diabetes mellitus without complications: Secondary | ICD-10-CM

## 2013-09-20 DIAGNOSIS — E785 Hyperlipidemia, unspecified: Secondary | ICD-10-CM

## 2013-09-20 DIAGNOSIS — M159 Polyosteoarthritis, unspecified: Secondary | ICD-10-CM

## 2013-09-20 MED ORDER — HYDROCODONE-ACETAMINOPHEN 5-325 MG PO TABS: ORAL_TABLET | ORAL | Status: DC

## 2013-09-20 NOTE — Progress Notes (Signed)
Subjective:    Patient ID: [name] [name], female    DOB: 02/03/1929, 80 y.o.   MRN: [id number]  HPI  80 year old patient who is seen today for followup.  She is followed by endocrinology for diabetes.  Metformin therapy has been discontinued due to elevated creatinine.  She is now on a evening dose of short acting insulin, but she takes this only infrequently.  She remains on basal insulin, as well as limit her eye.  She has treated hypertension, and DJD.  She does quite well and remains active.  She has had some mild right hip discomfort.  She has dyslipidemia and remains on simvastatin, but she continues to tolerate well.  No cardiopulmonary complaints.  Past Medical History  Diagnosis Date  . DEGENERATIVE JOINT DISEASE, GENERALIZED 02/01/2007    Qualifier: Diagnosis of  By: Burnice Logan  MD, Doretha Sou   . DIARRHEA, CHRONIC 08/20/2009    Qualifier: Diagnosis of  By: Burnice Logan  MD, Doretha Sou   . DM 02/01/2007    Qualifier: Diagnosis of  By: Floyde Parkins    . FATIGUE 10/20/2008    Qualifier: Diagnosis of  By: Burnice Logan  MD, Doretha Sou   . HYPERCHOLESTEROLEMIA 02/01/2007    Qualifier: Diagnosis of  By: Floyde Parkins    . HYPERLIPIDEMIA 12/20/2007    Qualifier: Diagnosis of  By: Burnice Logan  MD, Doretha Sou   . HYPERTENSION 02/01/2007    Qualifier: Diagnosis of  By: Floyde Parkins    . LOW BACK PAIN 01/13/2010    Qualifier: Diagnosis of  By: Burnice Logan  MD, Doretha Sou MUSCLE CRAMPS 04/06/2010    Qualifier: Diagnosis of  By: Burnice Logan  MD, Doretha Sou   . MYALGIA 01/20/2010    Qualifier: Diagnosis of  By: Elease Hashimoto MD, [place]    . OSTEOARTHRITIS 01/13/2010    Qualifier: Diagnosis of  By: Burnice Logan  MD, Doretha Sou   . OVERACTIVE BLADDER 08/20/2009    Qualifier: Diagnosis of  By: Burnice Logan  MD, Doretha Sou   . PLEURISY 06/18/2008    Qualifier: Diagnosis of  By: Niel Hummer MD, [place] PNEUMONIA 06/18/2008    Qualifier: Diagnosis of  By: Niel Hummer MD, [place] Rectal prolapse 09/20/2007    Qualifier:  Diagnosis of  By: Burnice Logan  MD, Doretha Sou   . VITAMIN B12 DEFICIENCY 07/20/2010    Qualifier: Diagnosis of  By: Carlean Purl MD, Dimas Millin Asthma     History   Social History  . Marital Status: Single    Spouse Name: N/A    Number of Children: N/A  . Years of Education: N/A   Occupational History  . Not on file.   Social History Main Topics  . Smoking status: Former Smoker    Quit date: 07/04/1938  . Smokeless tobacco: Never Used  . Alcohol Use: No  . Drug Use: No  . Sexual Activity: Not on file   Other Topics Concern  . Not on file   Social History Narrative  . No narrative on file    Past Surgical History  Procedure Laterality Date  . Cholecystectomy    . Bladder surgery    . Cataract extraction Bilateral 2012  . Abdominal hysterectomy      partial- states she has her ovaries  . Appendectomy      Family History  Problem Relation Age of Onset  . Deep vein thrombosis Mother   . COPD Father   .  Asthma Father   . Diabetes Neg Hx     family hx  . Cancer Neg Hx     breast ca - sister(S)?  Marland Kitchen Heart disease Neg Hx     family hx    Allergies  Allergen Reactions  . Metformin     REACTION: diarrhea  . Sitagliptin Phosphate     REACTION: abdominal pain    Current Outpatient Prescriptions on File Prior to Visit  Medication Sig Dispense Refill  . Acetaminophen 650 MG TABS Take 1 tablet (650 mg total) by mouth 3 (three) times daily as needed.  30 tablet  0  . albuterol (PROVENTIL,VENTOLIN) 90 MCG/ACT inhaler Inhale 2 puffs into the lungs every 6 (six) hours as needed.  34 g  3  . aspirin 81 MG tablet Take 81 mg by mouth daily.        . BD PEN NEEDLE NANO U/F 32G X 4 MM MISC USE ONE AS DIRECTED  EVERY DAY AND AS NEEDED  100 each  2  . calcium-vitamin D (OSCAL WITH D 500-200) 500-200 MG-UNIT per tablet Take 1 tablet by mouth daily.        . cyanocobalamin 1000 MCG tablet Take 100 mcg by mouth daily.        . fluticasone (FLONASE) 50 MCG/ACT nasal spray Place 1  spray into the nose daily.  16 g  0  . glimepiride (AMARYL) 4 MG tablet TAKE ONE TABLET BY MOUTH TWICE DAILY  180 tablet  1  . hydrochlorothiazide (MICROZIDE) 12.5 MG capsule Take 1 capsule (12.5 mg total) by mouth every morning.  90 capsule  3  . ibuprofen (ADVIL,MOTRIN) 600 MG tablet TAKE ONE TABLET BY MOUTH EVERY 6 HOURS AS NEEDED FOR PAIN  60 tablet  0  . insulin aspart (NOVOLOG) 100 UNIT/ML injection Please inject before a large dinner  9 mL  PRN  . losartan (COZAAR) 100 MG tablet TAKE ONE TABLET BY MOUTH ONCE DAILY  90 tablet  1  . Multiple Vitamin (MULTIVITAMIN) tablet Take 1 tablet by mouth daily.        . ONE TOUCH ULTRA TEST test strip USE ONE STRIP TO CHECK GLUCOSE THREE TIMES DAILY AS NEEDED  100 each  5  . simvastatin (ZOCOR) 40 MG tablet TAKE ONE-HALF TABLET BY MOUTH AT BEDTIME  45 tablet  1  . traZODone (DESYREL) 100 MG tablet TAKE ONE TABLET BY MOUTH AT BEDTIME AS NEEDED.  90 tablet  1  . venlafaxine XR (EFFEXOR-XR) 75 MG 24 hr capsule TAKE ONE CAPSULE BY MOUTH ONCE DAILY  90 capsule  1  . guaiFENesin-codeine 100-10 MG/5ML syrup Take 5 mLs by mouth at bedtime as needed for cough.  120 mL  0   No current facility-administered medications on file prior to visit.    BP 128/70  Pulse 78  Temp(Src) 98.5 F (36.9 C) (Oral)  Resp 20  Ht 5' 3.5" (1.613 m)  Wt 184 lb (83.462 kg)  BMI 32.08 kg/m2  SpO2 97%         Review of Systems  Constitutional: Negative.   HENT: Negative for congestion, dental problem, hearing loss, rhinorrhea, sinus pressure, sore throat and tinnitus.   Eyes: Negative for pain, discharge and visual disturbance.  Respiratory: Negative for cough and shortness of breath.   Cardiovascular: Negative for chest pain, palpitations and leg swelling.  Gastrointestinal: Negative for nausea, vomiting, abdominal pain, diarrhea, constipation, blood in stool and abdominal distention.  Genitourinary: Negative for dysuria, urgency, frequency,  hematuria, flank pain,  vaginal bleeding, vaginal discharge, difficulty urinating, vaginal pain and pelvic pain.  Musculoskeletal: Positive for arthralgias and gait problem. Negative for joint swelling.  Skin: Negative for rash.  Neurological: Negative for dizziness, syncope, speech difficulty, weakness, numbness and headaches.  Hematological: Negative for adenopathy.  Psychiatric/Behavioral: Negative for behavioral problems, dysphoric mood and agitation. The patient is not nervous/anxious.        Objective:   Physical Exam  Constitutional: She is oriented to person, place, and time. She appears well-developed and well-nourished.  HENT:  Head: Normocephalic.  Right Ear: External ear normal.  Left Ear: External ear normal.  Mouth/Throat: Oropharynx is clear and moist.  Eyes: Conjunctivae and EOM are normal. Pupils are equal, round, and reactive to light.  Neck: Normal range of motion. Neck supple. No thyromegaly present.  Cardiovascular: Normal rate, regular rhythm, normal heart sounds and intact distal pulses.   Pulmonary/Chest: Effort normal and breath sounds normal.  Abdominal: Soft. Bowel sounds are normal. She exhibits no mass. There is no tenderness.  Musculoskeletal: Normal range of motion.  Lymphadenopathy:    She has no cervical adenopathy.  Neurological: She is alert and oriented to person, place, and time.  Skin: Skin is warm and dry. No rash noted.  Psychiatric: She has a normal mood and affect. Her behavior is normal.          Assessment & Plan:   Diabetes mellitus.  Followup endocrinology  Hypertension well controlled  Dyslipidemia.  Continue statin therapy  Osteoarthritis.  In view of chronic kidney disease.  Anti-inflammatory drug therapy discouraged.  She does have a prescription for when necessary Vicodin.  Will use acetaminophen only

## 2013-09-20 NOTE — Patient Instructions (Signed)
Avoid Celebrex, and all anti-inflammatories, such as ibuprofen, Advil or Aleve  Take a calcium supplement, plus [phone] units of vitamin D    It is important that you exercise regularly, at least 20 minutes 3 to 4 times per week.  If you develop chest pain or shortness of breath seek  medical attention.  Please check your blood pressure on a regular basis.  If it is consistently greater than 150/90, please make an office appointment.  Limit your sodium (Salt) intake

## 2013-09-20 NOTE — Progress Notes (Signed)
Pre-visit discussion using our clinic review tool. No additional management support is needed unless otherwise documented below in the visit note.  

## 2013-09-23 ENCOUNTER — Ambulatory Visit (INDEPENDENT_AMBULATORY_CARE_PROVIDER_SITE_OTHER): Payer: Commercial Managed Care - HMO | Admitting: Internal Medicine

## 2013-09-23 ENCOUNTER — Encounter: Payer: Self-pay | Admitting: Internal Medicine

## 2013-09-23 ENCOUNTER — Telehealth: Payer: Self-pay | Admitting: Internal Medicine

## 2013-09-23 VITALS — BP 124/76 | HR 93 | Temp 98.3°F | Resp 12 | Wt 181.4 lb

## 2013-09-23 DIAGNOSIS — E119 Type 2 diabetes mellitus without complications: Secondary | ICD-10-CM

## 2013-09-23 MED ORDER — INSULIN ASPART 100 UNIT/ML ~~LOC~~ SOLN: SUBCUTANEOUS | Status: DC

## 2013-09-23 MED ORDER — GLIMEPIRIDE 4 MG PO TABS: 4.0000 mg | ORAL_TABLET | Freq: Two times a day (BID) | ORAL | Status: DC

## 2013-09-23 NOTE — Progress Notes (Signed)
Patient ID: [name] [name], female   DOB: [birth year], 80 y.o.   MRN: [id number]  HPI: [name] [name] is a 80 y.o.-year-old female, returning for f/u for DM2, dx 2005, insulin-dependent, uncontrolled, without complications. Last visit 2 mo ago.   Last hemoglobin A1c was: Lab Results  Component Value Date   HGBA1C 7.6* 07/25/2013   HGBA1C 6.7* 04/18/2013   HGBA1C 6.7* 11/22/2012  previously 9.6%, previously 8.8% 2 years ago.  Pt is on a regimen of:  - Lantus 40 units qhs - injects in the abdomen mostly sometimes in thighs, no problems with the injection sites - Amaryl 4 mg bid  - NovoLog 6 units before a large meal - she only used it 2x since last visit We had to stop Metformin 2/2 increased creatinine.  Pt checks her sugars 1-3 x a day- per log: - am: 62-122 >> 64-122 >> 70-155 >> 111-179 - lunch: 170 >> 168 >> n/c - before dinner: 127-168 >> 178 - later: 1-2h after dinner: 200s >> 150-200 >> 134-234, most 130-170s >> 140-266 >> 191-259 Had several lows. Lowest sugar was 70. She has hypoglycemia awareness around 70. Highest 259.  Pt has chronic kidney disease stage 3-4, last BUN/creatinine was:  Lab Results  Component Value Date   BUN 29* 04/18/2013   CREATININE 1.6* 04/18/2013  On Losartan. Last set of lipids: Lab Results  Component Value Date   CHOL 182 01/31/2012   HDL 48.60 01/31/2012   LDLDIRECT 85.2 01/31/2012   TRIG 320.0* 01/31/2012   CHOLHDL 4 01/31/2012  On Zocor.  Pt's last eye exam was in 08/2013, with Dr. Susa Loffler at [facility] ophthalmology. No DR.  Denies numbness and tingling in her legs.  ROS: Constitutional: no weight gain/loss, no fatigue, no subjective hyperthermia/hypothermia, + nocturia Eyes: no blurry vision, no xerophthalmia ENT: no sore throat, no nodules palpated in throat, no dysphagia/odynophagia, no hoarseness Cardiovascular: no CP/SOB/palpitations/leg swelling Respiratory: no cough/+ SOB/+ wheezing Gastrointestinal: no N/V/had  D/noC Musculoskeletal: no muscle/+ joint aches Skin: no rashes Neurological: no tremors/numbness/tingling/dizziness  I reviewed pt's medications, allergies, PMH, social hx, family hx and no changes required, except as mentioned above.  PE: BP 124/76  Pulse 93  Temp(Src) 98.3 F (36.8 C) (Oral)  Resp 12  Wt 181 lb 6.4 oz (82.283 kg)  SpO2 95% Wt Readings from Last 3 Encounters:  09/23/13 181 lb 6.4 oz (82.283 kg)  09/20/13 184 lb (83.462 kg)  07/25/13 177 lb 11.2 oz (80.604 kg)   Constitutional: overweight, in NAD Eyes: PERRLA, EOMI, no exophthalmos ENT: moist mucous membranes, no thyromegaly, no cervical lymphadenopathy Cardiovascular: RRR, SEM 2/6, no RG Respiratory: CTA B Gastrointestinal: abdomen soft, NT, ND, BS+ Musculoskeletal: no deformities, strength intact in all 4 Skin: moist, warm, no rashes  ASSESSMENT: 1. DM2, insulin-dependent, uncontrolled, without complications - She does have kidney dysfunction, but no diabetic retinopathy, so I doubt that her kidney dysfunction is due to diabetes  PLAN:  1. Patient with long-standing diabetes, with no apparent complications. No significant lows. After stopping Metformin, sugars increased >> high CBGs especially after dinner Patient Instructions  - continue Lantus 40 units at night - continue Amaryl 4 mg 2x a day - start NovoLog 6 units before dinner; increase to 8 units if you have a large dinner Please return in 3 month with your sugar log.   - Will check a hemoglobin A1c at next visit - given flu vaccine this season - I will see her back in 3 months  with her sugar log

## 2013-09-23 NOTE — Patient Instructions (Addendum)
-   continue Lantus 40 units at night - continue Amaryl 4 mg 2x a day - start NovoLog 6 units before dinner; increase to 8 units if you have a large dinner  Please return in 3 month with your sugar log.

## 2013-09-23 NOTE — Telephone Encounter (Signed)
Relevant patient education mailed to patient.  

## 2013-10-21 ENCOUNTER — Telehealth: Payer: Self-pay | Admitting: Internal Medicine

## 2013-10-21 ENCOUNTER — Other Ambulatory Visit: Payer: Self-pay | Admitting: *Deleted

## 2013-10-21 ENCOUNTER — Telehealth: Payer: Self-pay | Admitting: *Deleted

## 2013-10-21 MED ORDER — INSULIN GLARGINE 100 UNIT/ML SOLOSTAR PEN: 40.0000 [IU] | PEN_INJECTOR | Freq: Every day | SUBCUTANEOUS | Status: DC

## 2013-10-21 NOTE — Telephone Encounter (Signed)
If sugars at bedtime are high, then need to increase the dinnertime insulin by 1-2 units. If sugars at bedtime are in the 150's or lower, need to increase Lantus to 43 units.

## 2013-10-21 NOTE — Telephone Encounter (Signed)
Agree. Thank you, Larene Beach!

## 2013-10-21 NOTE — Telephone Encounter (Signed)
Returned pt's call. Pt asked to fix her lantus rx and re-send. Done. Pt then stated her sugars have been in the 150's in the AM. Please advise.

## 2013-10-21 NOTE — Telephone Encounter (Signed)
Pt would like to speak with Dr. Cruzita Lederer Assistant regarding her medicine   Call back:[phone]  Thank You

## 2013-10-21 NOTE — Telephone Encounter (Signed)
Called pt and she advised me that her #'s at bedtime are: 230, 245, 180, 183, 177 ... Her AM #'s have been 136, 140, 122, 185, 149, 161, 150, 145, 192. Advised pt to increase her dinnertime insulin by 1-2 units. Pt will do this for a few days and then call back to let us know what her sugar readings are. Be advised.

## 2013-10-21 NOTE — Telephone Encounter (Signed)
Wrong dose on sig. Fixed and re-sent to pt's pharmacy.

## 2013-11-25 ENCOUNTER — Other Ambulatory Visit: Payer: Self-pay | Admitting: Internal Medicine

## 2013-12-01 ENCOUNTER — Other Ambulatory Visit: Payer: Self-pay | Admitting: Internal Medicine

## 2013-12-09 ENCOUNTER — Other Ambulatory Visit: Payer: Self-pay | Admitting: *Deleted

## 2013-12-09 ENCOUNTER — Telehealth: Payer: Self-pay | Admitting: Internal Medicine

## 2013-12-09 MED ORDER — INSULIN GLARGINE 100 UNIT/ML SOLOSTAR PEN: 40.0000 [IU] | PEN_INJECTOR | Freq: Every day | SUBCUTANEOUS | Status: DC

## 2013-12-09 NOTE — Telephone Encounter (Signed)
Refill sent to pt's pharmacy. 

## 2013-12-09 NOTE — Telephone Encounter (Signed)
Patient has an appt scheduled on June 17th for Med refill  She needs her lantus refilled as she will be out before her appt  Thank You :)

## 2013-12-18 ENCOUNTER — Other Ambulatory Visit: Payer: Self-pay | Admitting: *Deleted

## 2013-12-18 ENCOUNTER — Telehealth: Payer: Self-pay | Admitting: Internal Medicine

## 2013-12-18 ENCOUNTER — Ambulatory Visit (INDEPENDENT_AMBULATORY_CARE_PROVIDER_SITE_OTHER): Payer: Commercial Managed Care - HMO | Admitting: Internal Medicine

## 2013-12-18 ENCOUNTER — Encounter: Payer: Self-pay | Admitting: Internal Medicine

## 2013-12-18 VITALS — BP 122/66 | HR 100 | Temp 98.4°F | Resp 12 | Wt 180.0 lb

## 2013-12-18 DIAGNOSIS — IMO0001 Reserved for inherently not codable concepts without codable children: Secondary | ICD-10-CM

## 2013-12-18 DIAGNOSIS — E1165 Type 2 diabetes mellitus with hyperglycemia: Principal | ICD-10-CM

## 2013-12-18 LAB — HEMOGLOBIN A1C: HEMOGLOBIN A1C: 7.7 % — AB (ref 4.6–6.5)

## 2013-12-18 MED ORDER — GLUCOSE BLOOD VI STRP: ORAL_STRIP | Status: DC

## 2013-12-18 NOTE — Telephone Encounter (Signed)
Pt request refill for one touch ultra test strips to be send to [facility]. Pt also have a question about novalog. Please call pt

## 2013-12-18 NOTE — Progress Notes (Signed)
Patient ID: [name] [name], female   DOB: 1928/11/07, 80 y.o.   MRN: [id number]  HPI: [name] [name] is a 80 y.o.-year-old female, returning for f/u for DM2, dx 2005, insulin-dependent, uncontrolled, without complications. Last visit 3 mo ago.   Last hemoglobin A1c was: Lab Results  Component Value Date   HGBA1C 7.6* 07/25/2013   HGBA1C 6.7* 04/18/2013   HGBA1C 6.7* 11/22/2012  previously 9.6%, previously 8.8% 2 years ago.  Pt is on a regimen of:  - Lantus 40 units qhs - injects in the abdomen mostly sometimes in thighs, no problems with the injection sites - Amaryl 4 mg bid  - NovoLog 6 units before dinner - she only takes this occasionally We had to stop Metformin 2/2 increased creatinine.  Pt checks her sugars 1-3 x a day- per log: - am: 62-122 >> 64-122 >> 70-155 >> 111-179 >> 95-160 - lunch: 170 >> 168 >> n/c - before dinner: 127-168 >> 178 >> n/c - later: 1-2h after dinner: 200s >> 150-200 >> 134-234, most 130-170s >> 140-266 >> 191-259 >> 173-260 No lows. Lowest sugar was 70. She has hypoglycemia awareness around 70. Highest 260.  Pt has chronic kidney disease stage 3-4, last BUN/creatinine was:  Lab Results  Component Value Date   BUN 29* 04/18/2013   CREATININE 1.6* 04/18/2013  On Losartan. Last set of lipids: Lab Results  Component Value Date   CHOL 182 01/31/2012   HDL 48.60 01/31/2012   LDLDIRECT 85.2 01/31/2012   TRIG 320.0* 01/31/2012   CHOLHDL 4 01/31/2012  On Zocor.  Pt's last eye exam was in 08/2013, with Dr. Susa Loffler at [facility] ophthalmology. No DR.  Denies numbness and tingling in her legs.  ROS: Constitutional: no weight gain/loss, no fatigue, no subjective hyperthermia/hypothermia Eyes: no blurry vision, no xerophthalmia ENT: no sore throat, no nodules palpated in throat, no dysphagia/odynophagia, + hoarseness Cardiovascular: no CP/SOB/palpitations/leg swelling Respiratory: no cough/+ SOB/+ wheezing Gastrointestinal: no N/V/had  D/noC Musculoskeletal: no muscle/ joint aches Skin: no rashes, + itching Neurological: no tremors/numbness/tingling/dizziness  I reviewed pt's medications, allergies, PMH, social hx, family hx and no changes required, except as mentioned above.  PE: BP 122/66  Pulse 100  Temp(Src) 98.4 F (36.9 C) (Oral)  Resp 12  Wt 180 lb (81.647 kg)  SpO2 95% Wt Readings from Last 3 Encounters:  12/18/13 180 lb (81.647 kg)  09/23/13 181 lb 6.4 oz (82.283 kg)  09/20/13 184 lb (83.462 kg)   Constitutional: overweight, in NAD Eyes: PERRLA, EOMI, no exophthalmos ENT: moist mucous membranes, no thyromegaly, no cervical lymphadenopathy Cardiovascular: RRR, SEM 2/6, no RG Respiratory: CTA B Gastrointestinal: abdomen soft, NT, ND, BS+ Musculoskeletal: no deformities, strength intact in all 4 Skin: moist, warm, no rashes  ASSESSMENT: 1. DM2, insulin-dependent, uncontrolled, without complications - She does have kidney dysfunction, but no diabetic retinopathy, so I doubt that her kidney dysfunction is due to diabetes  PLAN:  1. Patient with long-standing diabetes, with no apparent complications. No significant lows. After stopping Metformin, sugars increased >> high CBGs especially after dinner. I advised her to start dinnertime insulin, but she did not start this consistently. I advised her to start this. Will decrease the Lantus in a compensatory fashion. Patient Instructions  - please decrease the Lantus to 35 units at night - continue Amaryl 4 mg 2x a day - start NovoLog 6 units at dinnertime; increase to 8 units if you have a large dinner or the sugars at bedtime are >180 Please  check sugars occasionally around lunchtime, too. Please stop at the lab. Please come back for a follow-up appointment in 3 months. - Will check a hemoglobin A1c today - I will see her back in 3 months with her sugar log  Office Visit on 12/18/2013  Component Date Value Ref Range Status  . Hemoglobin A1C 12/18/2013  7.7* 4.6 - 6.5 % Final   Glycemic Control Guidelines for People with Diabetes:Non Diabetic:  <6%Goal of Therapy: <7%Additional Action Suggested:  >8%    HbA1c as before. See plan above.

## 2013-12-18 NOTE — Patient Instructions (Addendum)
-   please decrease the Lantus to 35 units at night - continue Amaryl 4 mg 2x a day - start NovoLog 6 units at dinnertime; increase to 8 units if you have a large dinner or the sugars at bedtime are >180 Please check sugars occasionally around lunchtime, too.  Please stop at the lab.  Please come back for a follow-up appointment in 3 months.

## 2013-12-18 NOTE — Telephone Encounter (Signed)
Sent in a refill for pt's test strips to [facility]. Returned pt's call and had to lvm to call me back.

## 2013-12-23 ENCOUNTER — Telehealth: Payer: Self-pay | Admitting: Internal Medicine

## 2013-12-23 ENCOUNTER — Ambulatory Visit (INDEPENDENT_AMBULATORY_CARE_PROVIDER_SITE_OTHER): Payer: Commercial Managed Care - HMO | Admitting: Family

## 2013-12-23 ENCOUNTER — Encounter: Payer: Self-pay | Admitting: Family

## 2013-12-23 VITALS — BP 130/74 | HR 87 | Temp 98.3°F | Wt 179.0 lb

## 2013-12-23 DIAGNOSIS — L259 Unspecified contact dermatitis, unspecified cause: Secondary | ICD-10-CM

## 2013-12-23 DIAGNOSIS — L299 Pruritus, unspecified: Secondary | ICD-10-CM

## 2013-12-23 DIAGNOSIS — L239 Allergic contact dermatitis, unspecified cause: Secondary | ICD-10-CM

## 2013-12-23 MED ORDER — METHYLPREDNISOLONE 4 MG PO KIT: PACK | ORAL | Status: AC

## 2013-12-23 NOTE — Patient Instructions (Signed)

## 2013-12-23 NOTE — Progress Notes (Signed)
Pre visit review using our clinic review tool, if applicable. No additional management support is needed unless otherwise documented below in the visit note. 

## 2013-12-23 NOTE — Progress Notes (Signed)
Subjective:    Patient ID: [name] [name], female    DOB: 03/17/1929, 80 y.o.   MRN: [id number]  HPI 80 y.o. White female presents with chief complaint of "im itching all over". Pt states that she has been itching for about one week. The itching is to her arms, legs, head and back. The itching is constant. She has taken benadryl which relieves the itching. Denies fever, fatigue, SOB and change in appetite.     Review of Systems  Constitutional: Negative.  Negative for fever, appetite change and fatigue.  HENT: Negative.   Eyes: Negative.   Respiratory: Positive for wheezing.   Cardiovascular: Negative.   Gastrointestinal: Negative.   Endocrine: Negative.   Musculoskeletal: Negative.   Skin: Positive for rash.       Rash to lower legs   Allergic/Immunologic: Positive for environmental allergies.  Neurological: Negative.   Psychiatric/Behavioral: Negative.    Past Medical History  Diagnosis Date  . DEGENERATIVE JOINT DISEASE, GENERALIZED 02/01/2007    Qualifier: Diagnosis of  By: Burnice Logan  MD, Doretha Sou   . DIARRHEA, CHRONIC 08/20/2009    Qualifier: Diagnosis of  By: Burnice Logan  MD, Doretha Sou   . DM 02/01/2007    Qualifier: Diagnosis of  By: Floyde Parkins    . FATIGUE 10/20/2008    Qualifier: Diagnosis of  By: Burnice Logan  MD, Doretha Sou   . HYPERCHOLESTEROLEMIA 02/01/2007    Qualifier: Diagnosis of  By: Floyde Parkins    . HYPERLIPIDEMIA 12/20/2007    Qualifier: Diagnosis of  By: Burnice Logan  MD, Doretha Sou   . HYPERTENSION 02/01/2007    Qualifier: Diagnosis of  By: Floyde Parkins    . LOW BACK PAIN 01/13/2010    Qualifier: Diagnosis of  By: Burnice Logan  MD, Doretha Sou MUSCLE CRAMPS 04/06/2010    Qualifier: Diagnosis of  By: Burnice Logan  MD, Doretha Sou   . MYALGIA 01/20/2010    Qualifier: Diagnosis of  By: Elease Hashimoto MD, [place]    . OSTEOARTHRITIS 01/13/2010    Qualifier: Diagnosis of  By: Burnice Logan  MD, Doretha Sou   . OVERACTIVE BLADDER 08/20/2009    Qualifier: Diagnosis of  By: Burnice Logan  MD,  Doretha Sou   . PLEURISY 06/18/2008    Qualifier: Diagnosis of  By: Niel Hummer MD, [place] PNEUMONIA 06/18/2008    Qualifier: Diagnosis of  By: Niel Hummer MD, [place] Rectal prolapse 09/20/2007    Qualifier: Diagnosis of  By: Burnice Logan  MD, Doretha Sou   . VITAMIN B12 DEFICIENCY 07/20/2010    Qualifier: Diagnosis of  By: Carlean Purl MD, Dimas Millin Asthma     History   Social History  . Marital Status: Single    Spouse Name: N/A    Number of Children: N/A  . Years of Education: N/A   Occupational History  . Not on file.   Social History Main Topics  . Smoking status: Former Smoker    Quit date: 07/04/1938  . Smokeless tobacco: Never Used  . Alcohol Use: No  . Drug Use: No  . Sexual Activity: Not on file   Other Topics Concern  . Not on file   Social History Narrative  . No narrative on file    Past Surgical History  Procedure Laterality Date  . Cholecystectomy    . Bladder surgery    . Cataract extraction Bilateral 2012  . Abdominal hysterectomy  partial- states she has her ovaries  . Appendectomy      Family History  Problem Relation Age of Onset  . Deep vein thrombosis Mother   . COPD Father   . Asthma Father   . Diabetes Neg Hx     family hx  . Cancer Neg Hx     breast ca - sister(S)?  Marland Kitchen Heart disease Neg Hx     family hx    Allergies  Allergen Reactions  . Metformin     REACTION: diarrhea  . Sitagliptin Phosphate     REACTION: abdominal pain    Current Outpatient Prescriptions on File Prior to Visit  Medication Sig Dispense Refill  . Acetaminophen 650 MG TABS Take 1 tablet (650 mg total) by mouth 3 (three) times daily as needed.  30 tablet  0  . albuterol (PROVENTIL,VENTOLIN) 90 MCG/ACT inhaler Inhale 2 puffs into the lungs every 6 (six) hours as needed.  34 g  3  . aspirin 81 MG tablet Take 81 mg by mouth daily.        . BD PEN NEEDLE NANO U/F 32G X 4 MM MISC USE ONE AS DIRECTED  EVERY DAY AND AS NEEDED  100 each  2  .  calcium-vitamin D (OSCAL WITH D 500-200) 500-200 MG-UNIT per tablet Take 1 tablet by mouth daily.        . cyanocobalamin 1000 MCG tablet Take 100 mcg by mouth daily.        . fluticasone (FLONASE) 50 MCG/ACT nasal spray Place 1 spray into the nose daily.  16 g  0  . glimepiride (AMARYL) 4 MG tablet Take 1 tablet (4 mg total) by mouth 2 (two) times daily.  180 tablet  3  . glucose blood (ONE TOUCH ULTRA TEST) test strip Use to test blood sugar 3 times daily as instructed. Dx code: 250.00  100 each  3  . guaiFENesin-codeine 100-10 MG/5ML syrup Take 5 mLs by mouth at bedtime as needed for cough.  120 mL  0  . hydrochlorothiazide (MICROZIDE) 12.5 MG capsule Take 1 capsule (12.5 mg total) by mouth every morning.  90 capsule  3  . HYDROcodone-acetaminophen (NORCO/VICODIN) 5-325 MG per tablet TAKE ONE TABLET BY MOUTH EVERY 6 HOURS AS NEEDED FOR PAIN  90 tablet  0  . ibuprofen (ADVIL,MOTRIN) 600 MG tablet TAKE ONE TABLET BY MOUTH EVERY 6 HOURS AS NEEDED FOR PAIN  60 tablet  2  . insulin aspart (NOVOLOG) 100 UNIT/ML injection Please inject 6-8 before dinner  9 mL  PRN  . Insulin Glargine (LANTUS) 100 UNIT/ML Solostar Pen Inject 40 Units into the skin daily at 10 pm.  15 mL  2  . losartan (COZAAR) 100 MG tablet TAKE ONE TABLET BY MOUTH ONCE DAILY  90 tablet  1  . Multiple Vitamin (MULTIVITAMIN) tablet Take 1 tablet by mouth daily.        . simvastatin (ZOCOR) 40 MG tablet TAKE ONE-HALF TABLET BY MOUTH ONCE DAILY AT BEDTIME  45 tablet  0  . traZODone (DESYREL) 100 MG tablet TAKE ONE TABLET BY MOUTH AT BEDTIME AS NEEDED.  90 tablet  1  . venlafaxine XR (EFFEXOR-XR) 75 MG 24 hr capsule TAKE ONE CAPSULE BY MOUTH ONCE DAILY  90 capsule  1   No current facility-administered medications on file prior to visit.    BP 130/74  Pulse 87  Temp(Src) 98.3 F (36.8 C) (Oral)  Wt 179 lb (81.194 kg)chart    Objective:  Physical Exam  Constitutional: She is oriented to person, place, and time. She appears  well-developed and well-nourished. She is active.  Cardiovascular: Normal rate, regular rhythm, normal heart sounds and normal pulses.   Pulmonary/Chest: Effort normal and breath sounds normal.  Abdominal: Soft. Normal appearance and bowel sounds are normal.  Neurological: She is alert and oriented to person, place, and time.  Skin: Rash noted.  Rash to bilateral legs. 2 swollen welts to right legs and 3 to left leg.   Psychiatric: She has a normal mood and affect. Her speech is normal and behavior is normal.          Assessment & Plan:  Dniya was seen today for itching.  Diagnoses and associated orders for this visit:  Allergic dermatitis  Other Orders - methylPREDNISolone (MEDROL DOSEPAK) 4 MG tablet; follow package directions   Education: medication discussed in detail including benefits and risk.   Follow up: as needed.

## 2013-12-23 NOTE — Telephone Encounter (Signed)
error 

## 2014-01-06 ENCOUNTER — Other Ambulatory Visit: Payer: Self-pay | Admitting: Internal Medicine

## 2014-01-20 ENCOUNTER — Other Ambulatory Visit: Payer: Self-pay | Admitting: Internal Medicine

## 2014-02-11 ENCOUNTER — Encounter: Payer: Self-pay | Admitting: Physician Assistant

## 2014-02-11 ENCOUNTER — Ambulatory Visit (INDEPENDENT_AMBULATORY_CARE_PROVIDER_SITE_OTHER): Payer: Commercial Managed Care - HMO | Admitting: Physician Assistant

## 2014-02-11 ENCOUNTER — Ambulatory Visit (INDEPENDENT_AMBULATORY_CARE_PROVIDER_SITE_OTHER)
Admission: RE | Admit: 2014-02-11 | Discharge: 2014-02-11 | Disposition: A | Discharge status: Home / Self Care | Payer: Commercial Managed Care - HMO | Source: Ambulatory Visit | Attending: Physician Assistant | Admitting: Physician Assistant

## 2014-02-11 VITALS — BP 128/80 | HR 92 | Temp 98.3°F | Resp 18 | Wt 184.0 lb

## 2014-02-11 DIAGNOSIS — R0789 Other chest pain: Secondary | ICD-10-CM

## 2014-02-11 DIAGNOSIS — R071 Chest pain on breathing: Secondary | ICD-10-CM

## 2014-02-11 MED ORDER — TRAMADOL HCL 50 MG PO TABS: 50.0000 mg | ORAL_TABLET | Freq: Three times a day (TID) | ORAL | Status: DC | PRN

## 2014-02-11 NOTE — Patient Instructions (Addendum)
You have an x-ray done today at [facility] office, we will call you with the results of this when they're available.  Tramadol every 8 hours as needed for pain.  If emergency symptoms discussed during visit developed, seek medical attention immediately.  Followup as needed, or for worsening or persistent symptoms despite treatment.    Chest Wall Pain Chest wall pain is pain felt in or around the chest bones and muscles. It may take up to 6 weeks to get better. It may take longer if you are active. Chest wall pain can happen on its own. Other times, things like germs, injury, coughing, or exercise can cause the pain. HOME CARE   Avoid activities that make you tired or cause pain. Try not to use your chest, belly (abdominal), or side muscles. Do not use heavy weights.  Put ice on the sore area.  Put ice in a plastic bag.  Place a towel between your skin and the bag.  Leave the ice on for 15-20 minutes for the first 2 days.  Only take medicine as told by your doctor. GET HELP RIGHT AWAY IF:   You have more pain or are very uncomfortable.  You have a fever.  Your chest pain gets worse.  You have new problems.  You feel sick to your stomach (nauseous) or throw up (vomit).  You start to sweat or feel lightheaded.  You have a cough with mucus (phlegm).  You cough up blood. MAKE SURE YOU:   Understand these instructions.  Will watch your condition.  Will get help right away if you are not doing well or get worse. Document Released: 12/07/2007 Document Revised: 09/12/2011 Document Reviewed: 02/14/2011 [facility] Patient Information 2015 [place], [place]. This information is not intended to replace advice given to you by your health care provider. Make sure you discuss any questions you have with your health care provider.

## 2014-02-11 NOTE — Progress Notes (Signed)
Pre visit review using our clinic review tool, if applicable. No additional management support is needed unless otherwise documented below in the visit note. 

## 2014-02-11 NOTE — Progress Notes (Signed)
Subjective:    Patient ID: [name] [name], female    DOB: [birth year], 80 y.o.   MRN: [id number]  Flank Pain This is a new problem. The current episode started 1 to 4 weeks ago (about 1 week). The problem occurs constantly. The problem is unchanged. The pain is present in the thoracic spine (and under her right ribs). Quality: "just hurts" The pain does not radiate. The pain is at a severity of 9/10. The pain is severe. The pain is worse during the day. The symptoms are aggravated by bending, coughing, position and twisting. Pertinent negatives include no abdominal pain, bladder incontinence, bowel incontinence, chest pain, dysuria, fever, headaches, leg pain, numbness, paresis, paresthesias, pelvic pain, perianal numbness, tingling, weakness or weight loss. Treatments tried: ibuprofen. The treatment provided no relief.   [name] [name] denies any injury to the area.    Review of Systems  Constitutional: Negative for fever, chills and weight loss.  Respiratory: Negative for cough, chest tightness, shortness of breath and wheezing.   Cardiovascular: Negative for chest pain.  Gastrointestinal: Negative for nausea, vomiting, abdominal pain, diarrhea and bowel incontinence.  Genitourinary: Positive for flank pain. Negative for bladder incontinence, dysuria and pelvic pain.  Musculoskeletal: Positive for back pain. Negative for gait problem.  Skin: Negative for color change.  Neurological: Negative for tingling, syncope, weakness, numbness, headaches and paresthesias.  All other systems reviewed and are negative.      Past Medical History  Diagnosis Date  . DEGENERATIVE JOINT DISEASE, GENERALIZED 02/01/2007    Qualifier: Diagnosis of  By: Burnice Logan  MD, Doretha Sou   . DIARRHEA, CHRONIC 08/20/2009    Qualifier: Diagnosis of  By: Burnice Logan  MD, Doretha Sou   . DM 02/01/2007    Qualifier: Diagnosis of  By: Floyde Parkins    . FATIGUE 10/20/2008    Qualifier: Diagnosis of  By: Burnice Logan  MD, Doretha Sou   .  HYPERCHOLESTEROLEMIA 02/01/2007    Qualifier: Diagnosis of  By: Floyde Parkins    . HYPERLIPIDEMIA 12/20/2007    Qualifier: Diagnosis of  By: Burnice Logan  MD, Doretha Sou   . HYPERTENSION 02/01/2007    Qualifier: Diagnosis of  By: Floyde Parkins    . LOW BACK PAIN 01/13/2010    Qualifier: Diagnosis of  By: Burnice Logan  MD, Doretha Sou MUSCLE CRAMPS 04/06/2010    Qualifier: Diagnosis of  By: Burnice Logan  MD, Doretha Sou   . MYALGIA 01/20/2010    Qualifier: Diagnosis of  By: Elease Hashimoto MD, [place]    . OSTEOARTHRITIS 01/13/2010    Qualifier: Diagnosis of  By: Burnice Logan  MD, Doretha Sou   . OVERACTIVE BLADDER 08/20/2009    Qualifier: Diagnosis of  By: Burnice Logan  MD, Doretha Sou   . PLEURISY 06/18/2008    Qualifier: Diagnosis of  By: Niel Hummer MD, [place] PNEUMONIA 06/18/2008    Qualifier: Diagnosis of  By: Niel Hummer MD, [place] Rectal prolapse 09/20/2007    Qualifier: Diagnosis of  By: Burnice Logan  MD, Doretha Sou   . VITAMIN B12 DEFICIENCY 07/20/2010    Qualifier: Diagnosis of  By: Carlean Purl MD, Dimas Millin Asthma     History   Social History  . Marital Status: Single    Spouse Name: N/A    Number of Children: N/A  . Years of Education: N/A   Occupational History  . Not on file.   Social History Main Topics  . Smoking status:  Former Smoker    Quit date: 07/04/1938  . Smokeless tobacco: Never Used  . Alcohol Use: No  . Drug Use: No  . Sexual Activity: Not on file   Other Topics Concern  . Not on file   Social History Narrative  . No narrative on file    Past Surgical History  Procedure Laterality Date  . Cholecystectomy    . Bladder surgery    . Cataract extraction Bilateral 2012  . Abdominal hysterectomy      partial- states [name] [name] has her ovaries  . Appendectomy      Family History  Problem Relation Age of Onset  . Deep vein thrombosis Mother   . COPD Father   . Asthma Father   . Diabetes Neg Hx     family hx  . Cancer Neg Hx     breast ca - sister(S)?  Marland Kitchen Heart disease  Neg Hx     family hx    Allergies  Allergen Reactions  . Metformin     REACTION: diarrhea  . Sitagliptin Phosphate     REACTION: abdominal pain    Current Outpatient Prescriptions on File Prior to Visit  Medication Sig Dispense Refill  . Acetaminophen 650 MG TABS Take 1 tablet (650 mg total) by mouth 3 (three) times daily as needed.  30 tablet  0  . albuterol (PROVENTIL,VENTOLIN) 90 MCG/ACT inhaler Inhale 2 puffs into the lungs every 6 (six) hours as needed.  34 g  3  . aspirin 81 MG tablet Take 81 mg by mouth daily.        . BD PEN NEEDLE NANO U/F 32G X 4 MM MISC USE ONE AS DIRECTED  EVERY DAY AND AS NEEDED  100 each  2  . calcium-vitamin D (OSCAL WITH D 500-200) 500-200 MG-UNIT per tablet Take 1 tablet by mouth daily.        . cyanocobalamin 1000 MCG tablet Take 100 mcg by mouth daily.        . fluticasone (FLONASE) 50 MCG/ACT nasal spray Place 1 spray into the nose daily.  16 g  0  . glimepiride (AMARYL) 4 MG tablet Take 1 tablet (4 mg total) by mouth 2 (two) times daily.  180 tablet  3  . glucose blood (ONE TOUCH ULTRA TEST) test strip Use to test blood sugar 3 times daily as instructed. Dx code: 250.00  100 each  3  . guaiFENesin-codeine 100-10 MG/5ML syrup Take 5 mLs by mouth at bedtime as needed for cough.  120 mL  0  . hydrochlorothiazide (MICROZIDE) 12.5 MG capsule Take 1 capsule (12.5 mg total) by mouth every morning.  90 capsule  3  . HYDROcodone-acetaminophen (NORCO/VICODIN) 5-325 MG per tablet TAKE ONE TABLET BY MOUTH EVERY 6 HOURS AS NEEDED FOR PAIN  90 tablet  0  . ibuprofen (ADVIL,MOTRIN) 600 MG tablet TAKE ONE TABLET BY MOUTH EVERY 6 HOURS AS NEEDED FOR PAIN  60 tablet  2  . insulin aspart (NOVOLOG) 100 UNIT/ML injection Please inject 6-8 before dinner  9 mL  PRN  . Insulin Glargine (LANTUS) 100 UNIT/ML Solostar Pen Inject 40 Units into the skin daily at 10 pm.  15 mL  2  . losartan (COZAAR) 100 MG tablet TAKE ONE TABLET BY MOUTH ONCE DAILY  90 tablet  0  . Multiple  Vitamin (MULTIVITAMIN) tablet Take 1 tablet by mouth daily.        . simvastatin (ZOCOR) 40 MG tablet TAKE ONE-HALF TABLET BY MOUTH  ONCE DAILY AT BEDTIME  45 tablet  0  . traZODone (DESYREL) 100 MG tablet TAKE ONE TABLET BY MOUTH AT BEDTIME AS NEEDED.  90 tablet  1  . venlafaxine XR (EFFEXOR-XR) 75 MG 24 hr capsule TAKE ONE CAPSULE BY MOUTH ONCE DAILY  90 capsule  0   No current facility-administered medications on file prior to visit.    EXAM: BP 128/80  Pulse 92  Temp(Src) 98.3 F (36.8 C) (Oral)  Resp 18  Wt 184 lb (83.462 kg)  SpO2 94%     Objective:   Physical Exam  Nursing note and vitals reviewed. Constitutional: [name] [name] is oriented to person, place, and time. [name] [name] appears well-developed and well-nourished. No distress.  HENT:  Head: Normocephalic and atraumatic.  Eyes: Conjunctivae and EOM are normal. Pupils are equal, round, and reactive to light.  Cardiovascular: Normal rate, regular rhythm and intact distal pulses.   Pulmonary/Chest: Effort normal and breath sounds normal. No respiratory distress. [name] [name] has no wheezes. [name] [name] has no rales. [name] [name] exhibits tenderness (rib lower ribs ttp.).  Abdominal: Soft. Bowel sounds are normal. [name] [name] exhibits no distension and no mass. There is no tenderness. There is no rebound and no guarding.  Neurological: [name] [name] is alert and oriented to person, place, and time.  Skin: Skin is warm and dry. [name] [name] is not diaphoretic.  Psychiatric: [name] [name] has a normal mood and affect. Her behavior is normal. Judgment and thought content normal.     Lab Results  Component Value Date   WBC 6.1 01/31/2012   HGB 12.2 01/31/2012   HCT 36.3 01/31/2012   PLT 253.0 01/31/2012   GLUCOSE 134* 04/18/2013   CHOL 182 01/31/2012   TRIG 320.0* 01/31/2012   HDL 48.60 01/31/2012   LDLDIRECT 85.2 01/31/2012   ALT 23 01/31/2012   AST 33 01/31/2012   NA 138 04/18/2013   K 4.8 04/18/2013   CL 102 04/18/2013   CREATININE 1.6* 04/18/2013   BUN 29* 04/18/2013   CO2 28 04/18/2013   TSH  1.21 01/31/2012   INR 1.02 05/30/2010   HGBA1C 7.7* 12/18/2013        Assessment & Plan:  Kearstin was seen today for right sided pain.  Diagnoses and associated orders for this visit:  Chest wall pain Comments: No injury, no pulmonary symptoms, will obtain rib films, tramadol for pain. Watchful waiting. - DG Chest 2 View; Future - DG Ribs Unilateral Right; Future - traMADol (ULTRAM) 50 MG tablet; Take 1 tablet (50 mg total) by mouth every 8 (eight) hours as needed.    Return precautions provided, and patient handout on chest wall pain.  Plan to follow up as needed, or for worsening or persistent symptoms despite treatment.  Patient Instructions  You have an x-ray done today at [facility] office, we will call you with the results of this when they're available.  Tramadol every 8 hours as needed for pain.  If emergency symptoms discussed during visit developed, seek medical attention immediately.  Followup as needed, or for worsening or persistent symptoms despite treatment.

## 2014-02-12 ENCOUNTER — Telehealth: Payer: Self-pay

## 2014-02-12 NOTE — Telephone Encounter (Signed)
Left a message for return call.  

## 2014-02-12 NOTE — Telephone Encounter (Signed)
If she would like, she can try taking Tylenol along with the Tramadol as this can provide additional relief. Luckily there are no severe obvious injuries. Unfortunately, there is likely some underlying bruising, and it will be painful, probably for several weeks. Again, it is best to assess response to treatment of musculoskeletal issues every few days instead of day to day. Certainly if she feels as though she is worsening at any point over the next few weeks, she should follow up with the office.

## 2014-02-12 NOTE — Telephone Encounter (Signed)
Called and spoke with pt about x-ray of ribs.  Pt states she took of the Tramadol yesterday and 1 today and it is not helping.  Any further suggestions?

## 2014-02-13 NOTE — Telephone Encounter (Signed)
We are unsure how she would have gotten the bruising. The long car ride last week she mentioned is one possible cause.

## 2014-02-13 NOTE — Telephone Encounter (Signed)
Called and spoke with pt and pt is aware of recommendations.  Pt would like to know how she got the bruising.  Pls advise.

## 2014-02-17 ENCOUNTER — Other Ambulatory Visit: Payer: Self-pay | Admitting: Internal Medicine

## 2014-03-03 ENCOUNTER — Other Ambulatory Visit: Payer: Self-pay | Admitting: Internal Medicine

## 2014-03-20 ENCOUNTER — Encounter: Payer: Self-pay | Admitting: Internal Medicine

## 2014-03-20 ENCOUNTER — Ambulatory Visit (INDEPENDENT_AMBULATORY_CARE_PROVIDER_SITE_OTHER): Payer: Medicare HMO | Admitting: Internal Medicine

## 2014-03-20 ENCOUNTER — Other Ambulatory Visit: Payer: Self-pay | Admitting: *Deleted

## 2014-03-20 VITALS — BP 142/78 | HR 81 | Temp 98.3°F | Resp 12 | Wt 175.0 lb

## 2014-03-20 DIAGNOSIS — E1165 Type 2 diabetes mellitus with hyperglycemia: Principal | ICD-10-CM

## 2014-03-20 DIAGNOSIS — Z23 Encounter for immunization: Secondary | ICD-10-CM

## 2014-03-20 DIAGNOSIS — IMO0001 Reserved for inherently not codable concepts without codable children: Secondary | ICD-10-CM

## 2014-03-20 MED ORDER — INSULIN ASPART 100 UNIT/ML ~~LOC~~ SOLN: SUBCUTANEOUS | Status: DC

## 2014-03-20 NOTE — Progress Notes (Signed)
Patient ID: [name] [name], female   DOB: 11-Apr-1929, 80 y.o.   MRN: [id number]  HPI: [name] [name] is a 80 y.o.-year-old female, returning for f/u for DM2, dx 2005, insulin-dependent, uncontrolled, without complications. Last visit 3 mo ago.  Last hemoglobin A1c was: Lab Results  Component Value Date   HGBA1C 7.7* 12/18/2013   HGBA1C 7.6* 07/25/2013   HGBA1C 6.7* 04/18/2013  previously 9.6%, previously 8.8% 2 years ago.  Pt is on a regimen of:  - Lantus 40 >> 35 units qhs - injects in the abdomen mostly sometimes in thighs, no problems with the injection sites - Amaryl 4 mg bid  - NovoLog 10 units before dinner (4/7 days) >> does not check sugars after this  We had to stop Metformin 2/2 increased creatinine.  Pt checks her sugars 1-3 x a day- no log. She is frustrated that her sugars are not better...: - am: 62-122 >> 64-122 >> 70-155 >> 111-179 >> 95-160 >> 120-140 - lunch: 170 >> 168 >> n/c  - before dinner: 127-168 >> 178 >> n/c - later: 1-2h after dinner: 200s >> 150-200 >> 134-234, most 130-170s >> 140-266 >> 191-259 >> 173-260 >> 200-240 No lows. Lowest sugar was 70. She has hypoglycemia awareness around 70. Highest 260.  Pt has chronic kidney disease stage 3-4, last BUN/creatinine was:  Lab Results  Component Value Date   BUN 29* 04/18/2013   CREATININE 1.6* 04/18/2013  On Losartan. Last set of lipids: Lab Results  Component Value Date   CHOL 182 01/31/2012   HDL 48.60 01/31/2012   LDLDIRECT 85.2 01/31/2012   TRIG 320.0* 01/31/2012   CHOLHDL 4 01/31/2012  On Zocor.  Pt's last eye exam was in 08/2013, with Dr. Susa Loffler at [facility]. No DR.  Denies numbness and tingling in her legs.  ROS: Constitutional: no weight gain/loss, no fatigue, no subjective hyperthermia/hypothermia Eyes: no blurry vision, no xerophthalmia ENT: no sore throat, no nodules palpated in throat, no dysphagia/odynophagia, + hoarseness Cardiovascular: no  CP/SOB/palpitations/leg swelling Respiratory: + cough/no SOB/wheezing Gastrointestinal: no N/V/D/C Musculoskeletal: no muscle/ joint aches Skin: no rashes Neurological: no tremors/numbness/tingling/dizziness  I reviewed pt's medications, allergies, PMH, social hx, family hx and no changes required, except as mentioned above.  PE: BP 142/78  Pulse 81  Temp(Src) 98.3 F (36.8 C) (Oral)  Resp 12  Wt 175 lb (79.379 kg)  SpO2 96% Wt Readings from Last 3 Encounters:  03/20/14 175 lb (79.379 kg)  02/11/14 184 lb (83.462 kg)  12/23/13 179 lb (81.194 kg)   Constitutional: overweight, in NAD Eyes: PERRLA, EOMI, no exophthalmos ENT: moist mucous membranes, no thyromegaly, no cervical lymphadenopathy Cardiovascular: RRR, SEM 2/6, no RG Respiratory: CTA B Gastrointestinal: abdomen soft, NT, ND, BS+ Musculoskeletal: no deformities, strength intact in all 4 Skin: moist, warm, no rashes  ASSESSMENT: 1. DM2, insulin-dependent, uncontrolled, without complications - She does have kidney dysfunction, but no diabetic retinopathy, so I doubt that her kidney dysfunction is due to diabetes  PLAN:  1. Patient with long-standing diabetes, with no apparent complications. No significant lows. After stopping Metformin, sugars increased >> high CBGs especially after dinner. I advised her to start dinnertime insulin, but she is not using it this consistently. I advised her to start this and, per her request, we will also use NovoLog with her other meals:   Patient Instructions  Please continue Lantus 35 units qhs Continue Amaryl 4 mg 2x a day Please increase NovoLog as follows (please inject 15 min  before a meal): - before breakfast: 5 units - before lunch: 5 units - before dinner: 8 units Please return in 3 month with your sugar log. Please let me know if the sugars are consistently <80 or >200.  Please stop at the lab.  - Will check a hemoglobin A1c today - I will see her back in 3 months with  her sugar log  Office Visit on 03/20/2014  Component Date Value Ref Range Status  . Hemoglobin A1C 03/20/2014 8.5* 4.6 - 6.5 % Final   Glycemic Control Guidelines for People with Diabetes:Non Diabetic:  <6%Goal of Therapy: <7%Additional Action Suggested:  >8%    HbA1c increased. Needs improved compliance with injections.

## 2014-03-20 NOTE — Patient Instructions (Signed)
Please continue Lantus 35 units qhs Continue Amaryl 4 mg 2x a day Please increase NovoLog as follows (please inject 15 min before a meal): - before breakfast: 5 units - before lunch: 5 units - before dinner: 8 units Please return in 3 month with your sugar log. Please let me know if the sugars are consistently <80 or >200.  Please stop at the lab.

## 2014-03-21 LAB — HEMOGLOBIN A1C: HEMOGLOBIN A1C: 8.5 % — AB (ref 4.6–6.5)

## 2014-03-24 ENCOUNTER — Encounter: Payer: Self-pay | Admitting: *Deleted

## 2014-03-24 ENCOUNTER — Ambulatory Visit (INDEPENDENT_AMBULATORY_CARE_PROVIDER_SITE_OTHER): Payer: Commercial Managed Care - HMO | Admitting: Internal Medicine

## 2014-03-24 ENCOUNTER — Encounter: Payer: Self-pay | Admitting: Internal Medicine

## 2014-03-24 VITALS — BP 140/70 | HR 72 | Temp 98.0°F | Resp 20 | Ht 63.5 in | Wt 180.0 lb

## 2014-03-24 DIAGNOSIS — E785 Hyperlipidemia, unspecified: Secondary | ICD-10-CM

## 2014-03-24 DIAGNOSIS — M1990 Unspecified osteoarthritis, unspecified site: Secondary | ICD-10-CM

## 2014-03-24 DIAGNOSIS — I10 Essential (primary) hypertension: Secondary | ICD-10-CM

## 2014-03-24 DIAGNOSIS — IMO0001 Reserved for inherently not codable concepts without codable children: Secondary | ICD-10-CM

## 2014-03-24 DIAGNOSIS — M545 Low back pain, unspecified: Secondary | ICD-10-CM

## 2014-03-24 DIAGNOSIS — E1165 Type 2 diabetes mellitus with hyperglycemia: Secondary | ICD-10-CM

## 2014-03-24 DIAGNOSIS — Z23 Encounter for immunization: Secondary | ICD-10-CM

## 2014-03-24 MED ORDER — HYDROCODONE-ACETAMINOPHEN 5-325 MG PO TABS: ORAL_TABLET | ORAL | Status: DC

## 2014-03-24 NOTE — Progress Notes (Signed)
Subjective:    Patient ID: [name] [name], female    DOB: 1928/08/21, 80 y.o.   MRN: [id number]  HPI   80 year old patient who is seen today for her by annual followup.  She is followed quarterly by endocrinology for type 2 diabetes.  Hemoglobin A1c recently elevated and mealtime insulin adjusted. She has treated hypertension, which has been well-controlled.  She has dyslipidemia. Denies any cardiopulmonary complaints. She does have a history of osteoarthritis.  She does require when necessary, hydrocodone.  A controlled substance contract signed today. Earlier this summer , she had some pleurisy.  That has resolved  Past Medical History  Diagnosis Date  . DEGENERATIVE JOINT DISEASE, GENERALIZED 02/01/2007    Qualifier: Diagnosis of  By: Burnice Logan  MD, Doretha Sou   . DIARRHEA, CHRONIC 08/20/2009    Qualifier: Diagnosis of  By: Burnice Logan  MD, Doretha Sou   . DM 02/01/2007    Qualifier: Diagnosis of  By: Floyde Parkins    . FATIGUE 10/20/2008    Qualifier: Diagnosis of  By: Burnice Logan  MD, Doretha Sou   . HYPERCHOLESTEROLEMIA 02/01/2007    Qualifier: Diagnosis of  By: Floyde Parkins    . HYPERLIPIDEMIA 12/20/2007    Qualifier: Diagnosis of  By: Burnice Logan  MD, Doretha Sou   . HYPERTENSION 02/01/2007    Qualifier: Diagnosis of  By: Floyde Parkins    . LOW BACK PAIN 01/13/2010    Qualifier: Diagnosis of  By: Burnice Logan  MD, Doretha Sou MUSCLE CRAMPS 04/06/2010    Qualifier: Diagnosis of  By: Burnice Logan  MD, Doretha Sou   . MYALGIA 01/20/2010    Qualifier: Diagnosis of  By: Elease Hashimoto MD, Bruce    . OSTEOARTHRITIS 01/13/2010    Qualifier: Diagnosis of  By: Burnice Logan  MD, Doretha Sou   . OVERACTIVE BLADDER 08/20/2009    Qualifier: Diagnosis of  By: Burnice Logan  MD, Doretha Sou   . PLEURISY 06/18/2008    Qualifier: Diagnosis of  By: Niel Hummer MD, [place] PNEUMONIA 06/18/2008    Qualifier: Diagnosis of  By: Niel Hummer MD, [place] Rectal prolapse 09/20/2007    Qualifier: Diagnosis of  By: Burnice Logan  MD,  Doretha Sou   . VITAMIN B12 DEFICIENCY 07/20/2010    Qualifier: Diagnosis of  By: Carlean Purl MD, Dimas Millin Asthma     History   Social History  . Marital Status: Single    Spouse Name: N/A    Number of Children: N/A  . Years of Education: N/A   Occupational History  . Not on file.   Social History Main Topics  . Smoking status: Former Smoker    Quit date: 07/04/1938  . Smokeless tobacco: Never Used  . Alcohol Use: No  . Drug Use: No  . Sexual Activity: Not on file   Other Topics Concern  . Not on file   Social History Narrative  . No narrative on file    Past Surgical History  Procedure Laterality Date  . Cholecystectomy    . Bladder surgery    . Cataract extraction Bilateral 2012  . Abdominal hysterectomy      partial- states she has her ovaries  . Appendectomy      Family History  Problem Relation Age of Onset  . Deep vein thrombosis Mother   . COPD Father   . Asthma Father   . Diabetes Neg Hx     family hx  .  Cancer Neg Hx     breast ca - sister(S)?  Marland Kitchen Heart disease Neg Hx     family hx    Allergies  Allergen Reactions  . Metformin     REACTION: diarrhea  . Sitagliptin Phosphate     REACTION: abdominal pain    Current Outpatient Prescriptions on File Prior to Visit  Medication Sig Dispense Refill  . Acetaminophen 650 MG TABS Take 1 tablet (650 mg total) by mouth 3 (three) times daily as needed.  30 tablet  0  . albuterol (PROVENTIL,VENTOLIN) 90 MCG/ACT inhaler Inhale 2 puffs into the lungs every 6 (six) hours as needed.  34 g  3  . aspirin 81 MG tablet Take 81 mg by mouth daily.        . BD PEN NEEDLE NANO U/F 32G X 4 MM MISC USE ONE AS DIRECTED  EVERY DAY AND AS NEEDED  100 each  2  . Blood Glucose Calibration (ADVOCATE REDI-CODE+ CONTROL) LOW SOLN       . calcium-vitamin D (OSCAL WITH D 500-200) 500-200 MG-UNIT per tablet Take 1 tablet by mouth daily.        . cyanocobalamin 1000 MCG tablet Take 100 mcg by mouth daily.        . fluticasone  (FLONASE) 50 MCG/ACT nasal spray Place 1 spray into the nose daily.  16 g  0  . glimepiride (AMARYL) 4 MG tablet Take 1 tablet (4 mg total) by mouth 2 (two) times daily.  180 tablet  3  . guaiFENesin-codeine 100-10 MG/5ML syrup Take 5 mLs by mouth at bedtime as needed for cough.  120 mL  0  . hydrochlorothiazide (MICROZIDE) 12.5 MG capsule Take 1 capsule (12.5 mg total) by mouth every morning.  90 capsule  3  . ibuprofen (ADVIL,MOTRIN) 600 MG tablet TAKE ONE TABLET BY MOUTH EVERY 6 HOURS AS NEEDED FOR PAIN  60 tablet  2  . insulin aspart (NOVOLOG) 100 UNIT/ML injection Please inject 5-8 before each of the 3 meals  15 mL  11  . Insulin Glargine (LANTUS) 100 UNIT/ML Solostar Pen Inject 40 Units into the skin daily at 10 pm.  15 mL  2  . losartan (COZAAR) 100 MG tablet TAKE ONE TABLET BY MOUTH ONCE DAILY  90 tablet  0  . Multiple Vitamin (MULTIVITAMIN) tablet Take 1 tablet by mouth daily.        Marland Kitchen PHARMACIST CHOICE LANCETS MISC       . simvastatin (ZOCOR) 40 MG tablet TAKE ONE-HALF TABLET BY MOUTH AT BEDTIME  45 tablet  1  . traZODone (DESYREL) 100 MG tablet TAKE ONE TABLET BY MOUTH AT BEDTIME AS NEEDED  90 tablet  1  . venlafaxine XR (EFFEXOR-XR) 75 MG 24 hr capsule TAKE ONE CAPSULE BY MOUTH ONCE DAILY  90 capsule  0   No current facility-administered medications on file prior to visit.    BP 140/70  Pulse 72  Temp(Src) 98 F (36.7 C) (Oral)  Resp 20  Ht 5' 3.5" (1.613 m)  Wt 180 lb (81.647 kg)  BMI 31.38 kg/m2  SpO2 98%     Review of Systems  Constitutional: Negative.   HENT: Negative for congestion, dental problem, hearing loss, rhinorrhea, sinus pressure, sore throat and tinnitus.   Eyes: Negative for pain, discharge and visual disturbance.  Respiratory: Negative for cough and shortness of breath.   Cardiovascular: Negative for chest pain, palpitations and leg swelling.  Gastrointestinal: Negative for nausea, vomiting, abdominal pain, diarrhea,  constipation, blood in stool and  abdominal distention.  Genitourinary: Negative for dysuria, urgency, frequency, hematuria, flank pain, vaginal bleeding, vaginal discharge, difficulty urinating, vaginal pain and pelvic pain.  Musculoskeletal: Positive for arthralgias and back pain. Negative for gait problem and joint swelling.  Skin: Negative for rash.  Neurological: Negative for dizziness, syncope, speech difficulty, weakness, numbness and headaches.  Hematological: Negative for adenopathy.  Psychiatric/Behavioral: Negative for behavioral problems, dysphoric mood and agitation. The patient is not nervous/anxious.        Objective:   Physical Exam  Constitutional: She is oriented to person, place, and time. She appears well-developed and well-nourished.  HENT:  Head: Normocephalic.  Right Ear: External ear normal.  Left Ear: External ear normal.  Mouth/Throat: Oropharynx is clear and moist.  Eyes: Conjunctivae and EOM are normal. Pupils are equal, round, and reactive to light.  Neck: Normal range of motion. Neck supple. No thyromegaly present.  Cardiovascular: Normal rate, regular rhythm and normal heart sounds.   Pulmonary/Chest: Effort normal and breath sounds normal.  Abdominal: Soft. Bowel sounds are normal. She exhibits no mass. There is no tenderness.  Musculoskeletal: Normal range of motion.  Lymphadenopathy:    She has no cervical adenopathy.  Neurological: She is alert and oriented to person, place, and time.  Skin: Skin is warm and dry. No rash noted.  Psychiatric: She has a normal mood and affect. Her behavior is normal.          Assessment & Plan:   Diabetes mellitus.  Suboptimal control.  No comments on the adjusted.  Recheck hemoglobin A1c in 3 months.  Followup endocrinology Hypertension well controlled.  No change in therapy.  Recheck in 6 months Dyslipidemia.  Continue statin therapy Osteoarthritis and chronic low back pain.  Hydrocodone refilled controlled substance contract signed  CPX 6  months

## 2014-03-24 NOTE — Patient Instructions (Signed)
Please check your hemoglobin A1c every 3 months  Limit your sodium (Salt) intake  Return in 6 months for follow-up

## 2014-03-24 NOTE — Progress Notes (Signed)
Pre visit review using our clinic review tool, if applicable. No additional management support is needed unless otherwise documented below in the visit note. 

## 2014-03-25 ENCOUNTER — Other Ambulatory Visit: Payer: Self-pay | Admitting: *Deleted

## 2014-03-25 MED ORDER — INSULIN ASPART 100 UNIT/ML FLEXPEN: PEN_INJECTOR | SUBCUTANEOUS | Status: DC

## 2014-03-25 NOTE — Telephone Encounter (Signed)
Pt wants flexpen instead of vial.

## 2014-03-30 ENCOUNTER — Other Ambulatory Visit: Payer: Self-pay | Admitting: Internal Medicine

## 2014-04-06 ENCOUNTER — Other Ambulatory Visit: Payer: Self-pay | Admitting: Internal Medicine

## 2014-04-25 ENCOUNTER — Encounter: Payer: Self-pay | Admitting: Internal Medicine

## 2014-04-25 ENCOUNTER — Ambulatory Visit (INDEPENDENT_AMBULATORY_CARE_PROVIDER_SITE_OTHER): Payer: Commercial Managed Care - HMO | Admitting: Internal Medicine

## 2014-04-25 VITALS — BP 128/74 | HR 95 | Temp 99.3°F | Resp 20 | Ht 63.5 in | Wt 175.0 lb

## 2014-04-25 DIAGNOSIS — J441 Chronic obstructive pulmonary disease with (acute) exacerbation: Secondary | ICD-10-CM

## 2014-04-25 DIAGNOSIS — E1165 Type 2 diabetes mellitus with hyperglycemia: Secondary | ICD-10-CM

## 2014-04-25 MED ORDER — AZITHROMYCIN 250 MG PO TABS: ORAL_TABLET | ORAL | Status: DC

## 2014-04-25 MED ORDER — HYDROCODONE-ACETAMINOPHEN 5-325 MG PO TABS: ORAL_TABLET | ORAL | Status: DC

## 2014-04-25 NOTE — Progress Notes (Signed)
Subjective:    Patient ID: [name] [name], female    DOB: [birth year], 80 y.o.   MRN: [id number]  HPI 80 year old patient who has type 2 diabetes.  For the past week.  She has had fever, worsening chest congestion, cough, and purulent sputum production.  Denies any shortness of breath or wheezing.  She has worsened in spite of symptomatic therapy over the past week.  She has had low-grade fever and general sense of unwellness.  Past Medical History  Diagnosis Date  . DEGENERATIVE JOINT DISEASE, GENERALIZED 02/01/2007    Qualifier: Diagnosis of  By: Burnice Logan  MD, Doretha Sou   . DIARRHEA, CHRONIC 08/20/2009    Qualifier: Diagnosis of  By: Burnice Logan  MD, Doretha Sou   . DM 02/01/2007    Qualifier: Diagnosis of  By: Floyde Parkins    . FATIGUE 10/20/2008    Qualifier: Diagnosis of  By: Burnice Logan  MD, Doretha Sou   . HYPERCHOLESTEROLEMIA 02/01/2007    Qualifier: Diagnosis of  By: Floyde Parkins    . HYPERLIPIDEMIA 12/20/2007    Qualifier: Diagnosis of  By: Burnice Logan  MD, Doretha Sou   . HYPERTENSION 02/01/2007    Qualifier: Diagnosis of  By: Floyde Parkins    . LOW BACK PAIN 01/13/2010    Qualifier: Diagnosis of  By: Burnice Logan  MD, Doretha Sou MUSCLE CRAMPS 04/06/2010    Qualifier: Diagnosis of  By: Burnice Logan  MD, Doretha Sou   . MYALGIA 01/20/2010    Qualifier: Diagnosis of  By: Elease Hashimoto MD, [place]    . OSTEOARTHRITIS 01/13/2010    Qualifier: Diagnosis of  By: Burnice Logan  MD, Doretha Sou   . OVERACTIVE BLADDER 08/20/2009    Qualifier: Diagnosis of  By: Burnice Logan  MD, Doretha Sou   . PLEURISY 06/18/2008    Qualifier: Diagnosis of  By: Niel Hummer MD, [place] PNEUMONIA 06/18/2008    Qualifier: Diagnosis of  By: Niel Hummer MD, [place] Rectal prolapse 09/20/2007    Qualifier: Diagnosis of  By: Burnice Logan  MD, Doretha Sou   . VITAMIN B12 DEFICIENCY 07/20/2010    Qualifier: Diagnosis of  By: Carlean Purl MD, Dimas Millin Asthma     History   Social History  . Marital Status: Single    Spouse Name: N/A      Number of Children: N/A  . Years of Education: N/A   Occupational History  . Not on file.   Social History Main Topics  . Smoking status: Former Smoker    Quit date: 07/04/1938  . Smokeless tobacco: Never Used  . Alcohol Use: No  . Drug Use: No  . Sexual Activity: Not on file   Other Topics Concern  . Not on file   Social History Narrative  . No narrative on file    Past Surgical History  Procedure Laterality Date  . Cholecystectomy    . Bladder surgery    . Cataract extraction Bilateral 2012  . Abdominal hysterectomy      partial- states she has her ovaries  . Appendectomy      Family History  Problem Relation Age of Onset  . Deep vein thrombosis Mother   . COPD Father   . Asthma Father   . Diabetes Neg Hx     family hx  . Cancer Neg Hx     breast ca - sister(S)?  Marland Kitchen Heart disease Neg Hx     family hx  Allergies  Allergen Reactions  . Metformin     REACTION: diarrhea  . Sitagliptin Phosphate     REACTION: abdominal pain    Current Outpatient Prescriptions on File Prior to Visit  Medication Sig Dispense Refill  . Acetaminophen 650 MG TABS Take 1 tablet (650 mg total) by mouth 3 (three) times daily as needed.  30 tablet  0  . albuterol (PROVENTIL,VENTOLIN) 90 MCG/ACT inhaler Inhale 2 puffs into the lungs every 6 (six) hours as needed.  34 g  3  . aspirin 81 MG tablet Take 81 mg by mouth daily.        . BD PEN NEEDLE NANO U/F 32G X 4 MM MISC USE ONE AS DIRECTED  EVERY DAY AND AS NEEDED  100 each  2  . Blood Glucose Calibration (ADVOCATE REDI-CODE+ CONTROL) LOW SOLN       . calcium-vitamin D (OSCAL WITH D 500-200) 500-200 MG-UNIT per tablet Take 1 tablet by mouth daily.        . cyanocobalamin 1000 MCG tablet Take 100 mcg by mouth daily.        . fluticasone (FLONASE) 50 MCG/ACT nasal spray Place 1 spray into the nose daily.  16 g  0  . glimepiride (AMARYL) 4 MG tablet Take 1 tablet (4 mg total) by mouth 2 (two) times daily.  180 tablet  3  .  hydrochlorothiazide (MICROZIDE) 12.5 MG capsule Take 1 capsule (12.5 mg total) by mouth every morning.  90 capsule  3  . ibuprofen (ADVIL,MOTRIN) 600 MG tablet TAKE ONE TABLET BY MOUTH EVERY 6 HOURS AS NEEDED FOR PAIN  60 tablet  2  . insulin aspart (NOVOLOG FLEXPEN) 100 UNIT/ML FlexPen Inject 5-8 units into the skin before each of the 3 meals. Dx code: 250.02  15 mL  3  . insulin aspart (NOVOLOG) 100 UNIT/ML injection Please inject 5-8 before each of the 3 meals  15 mL  11  . losartan (COZAAR) 100 MG tablet TAKE ONE TABLET BY MOUTH ONCE DAILY  90 tablet  0  . Multiple Vitamin (MULTIVITAMIN) tablet Take 1 tablet by mouth daily.        Marland Kitchen PHARMACIST CHOICE LANCETS MISC       . simvastatin (ZOCOR) 40 MG tablet TAKE ONE-HALF TABLET BY MOUTH AT BEDTIME  45 tablet  1  . traZODone (DESYREL) 100 MG tablet TAKE ONE TABLET BY MOUTH AT BEDTIME AS NEEDED  90 tablet  1  . venlafaxine XR (EFFEXOR-XR) 75 MG 24 hr capsule TAKE ONE CAPSULE BY MOUTH ONCE DAILY  90 capsule  1  . guaiFENesin-codeine 100-10 MG/5ML syrup Take 5 mLs by mouth at bedtime as needed for cough.  120 mL  0   No current facility-administered medications on file prior to visit.    BP 128/74  Pulse 95  Temp(Src) 99.3 F (37.4 C) (Oral)  Resp 20  Ht 5' 3.5" (1.613 m)  Wt 175 lb (79.379 kg)  BMI 30.51 kg/m2  SpO2 97%      Review of Systems  Constitutional: Positive for fever, activity change, appetite change and fatigue.  HENT: Negative for congestion, dental problem, hearing loss, rhinorrhea, sinus pressure, sore throat and tinnitus.   Eyes: Negative for pain, discharge and visual disturbance.  Respiratory: Positive for cough. Negative for shortness of breath.   Cardiovascular: Negative for chest pain, palpitations and leg swelling.  Gastrointestinal: Negative for nausea, vomiting, abdominal pain, diarrhea, constipation, blood in stool and abdominal distention.  Genitourinary: Negative for dysuria,  urgency, frequency, hematuria,  flank pain, vaginal bleeding, vaginal discharge, difficulty urinating, vaginal pain and pelvic pain.  Musculoskeletal: Negative for arthralgias, gait problem and joint swelling.  Skin: Negative for rash.  Neurological: Negative for dizziness, syncope, speech difficulty, weakness, numbness and headaches.  Hematological: Negative for adenopathy.  Psychiatric/Behavioral: Negative for behavioral problems, dysphoric mood and agitation. The patient is not nervous/anxious.        Objective:   Physical Exam  Constitutional: She is oriented to person, place, and time. She appears well-developed and well-nourished.  Appears unwell, but in no acute distress Temperature 99.3  HENT:  Head: Normocephalic.  Right Ear: External ear normal.  Left Ear: External ear normal.  Mouth/Throat: Oropharynx is clear and moist.  Eyes: Conjunctivae and EOM are normal. Pupils are equal, round, and reactive to light.  Neck: Normal range of motion. Neck supple. No thyromegaly present.  Cardiovascular: Normal rate, regular rhythm and normal heart sounds.   Pulmonary/Chest: Effort normal. No respiratory distress. She has no wheezes. She has rales.  Rales right lower lung field  Abdominal: Soft. Bowel sounds are normal. She exhibits no mass. There is no tenderness.  Musculoskeletal: Normal range of motion.  Lymphadenopathy:    She has no cervical adenopathy.  Neurological: She is alert and oriented to person, place, and time.  Skin: Skin is warm and dry. No rash noted.  Psychiatric: She has a normal mood and affect. Her behavior is normal.          Assessment & Plan:   URI.  Concern about early right lower lobe community acquired pneumonia.  We'll treat with azithromycin expectorants, Diabetes.  Continue present regimen.  More close home blood sugar monitoring.  Encouraged

## 2014-04-25 NOTE — Progress Notes (Signed)
Pre visit review using our clinic review tool, if applicable. No additional management support is needed unless otherwise documented below in the visit note. 

## 2014-04-25 NOTE — Patient Instructions (Signed)
Take over-the-counter expectorants and cough medications such as  Mucinex DM.  Call if there is no improvement in 5 to 7 days or if  you develop worsening cough, fever, or new symptoms, such as shortness of breath or chest pain.

## 2014-05-12 ENCOUNTER — Other Ambulatory Visit: Payer: Self-pay | Admitting: Internal Medicine

## 2014-06-17 ENCOUNTER — Encounter: Payer: Self-pay | Admitting: Internal Medicine

## 2014-06-17 ENCOUNTER — Ambulatory Visit (INDEPENDENT_AMBULATORY_CARE_PROVIDER_SITE_OTHER): Payer: Commercial Managed Care - HMO | Admitting: Internal Medicine

## 2014-06-17 VITALS — BP 120/72 | HR 90 | Temp 98.5°F | Resp 12 | Wt 177.8 lb

## 2014-06-17 DIAGNOSIS — E1121 Type 2 diabetes mellitus with diabetic nephropathy: Secondary | ICD-10-CM

## 2014-06-17 DIAGNOSIS — Z794 Long term (current) use of insulin: Secondary | ICD-10-CM | POA: Insufficient documentation

## 2014-06-17 DIAGNOSIS — E7800 Pure hypercholesterolemia, unspecified: Secondary | ICD-10-CM

## 2014-06-17 NOTE — Progress Notes (Signed)
Patient ID: [name] [name], female   DOB: 1928/11/21, 80 y.o.   MRN: [id number]  HPI: [name] [name] is a 80 y.o.-year-old female, returning for f/u for DM2, dx 2005, insulin-dependent, uncontrolled, without complications. Last visit 3 mo ago.  Since last visit, she had PNA.   Last hemoglobin A1c was: Lab Results  Component Value Date   HGBA1C 8.5* 03/20/2014   HGBA1C 7.7* 12/18/2013   HGBA1C 7.6* 07/25/2013  previously 9.6%, previously 8.8% 2 years ago.  Pt is on a regimen of: - Lantus 40 >> 35 units qhs - injects in the abdomen mostly  - Amaryl 4 mg bid  - NovoLog - missing 1/3 doses a day: - before breakfast: 5 units - before lunch: 5 units (usually skips) - before dinner: 8 units We had to stop Metformin 2/2 increased creatinine.  Pt checks her sugars 1-3 x a day- brings log: - am: 62-122 >> 64-122 >> 70-155 >> 111-179 >> 95-160 >> 120-140 >> 110-155 - lunch: 170 >> 168 >> n/c  - before dinner: 127-168 >> 178 >> n/c - later: 1-2h after dinner: 200s >> 150-200 >> 134-234, most 130-170s >> 140-266 >> 191-259 >> 173-260 >> 200-240 >> 135, 171-259, 272 No lows. Lowest sugar was 70. She has hypoglycemia awareness around 70. Highest 260.  Pt has chronic kidney disease stage 3-4, last BUN/creatinine was:  Lab Results  Component Value Date   BUN 29* 04/18/2013   CREATININE 1.6* 04/18/2013  On Losartan. Last set of lipids: Lab Results  Component Value Date   CHOL 182 01/31/2012   HDL 48.60 01/31/2012   LDLDIRECT 85.2 01/31/2012   TRIG 320.0* 01/31/2012   CHOLHDL 4 01/31/2012  On Zocor.  Pt's last eye exam was in 08/2013, with Dr. Susa Loffler at [facility] ophthalmology. No DR.  Denies numbness and tingling in her legs.  ROS: Constitutional: no weight gain/loss, no fatigue, no subjective hyperthermia/hypothermia Eyes: no blurry vision, no xerophthalmia ENT: no sore throat, no nodules palpated in throat, no dysphagia/odynophagia, + hoarseness Cardiovascular:  no CP/SOB/palpitations/leg swelling Respiratory: + cough/no SOB/wheezing Gastrointestinal: no N/V/D/C Musculoskeletal: no muscle/ joint aches Skin: no rashes Neurological: no tremors/numbness/tingling/dizziness  I reviewed pt's medications, allergies, PMH, social hx, family hx and no changes required, except as mentioned above.  PE: BP 120/72 mmHg  Pulse 90  Temp(Src) 98.5 F (36.9 C) (Oral)  Resp 12  Wt 177 lb 12.8 oz (80.65 kg)  SpO2 94% Wt Readings from Last 3 Encounters:  06/17/14 177 lb 12.8 oz (80.65 kg)  04/25/14 175 lb (79.379 kg)  03/24/14 180 lb (81.647 kg)   Constitutional: overweight, in NAD Eyes: PERRLA, EOMI, no exophthalmos ENT: moist mucous membranes, no thyromegaly, no cervical lymphadenopathy Cardiovascular: RRR, SEM 2/6, no RG Respiratory: CTA B Gastrointestinal: abdomen soft, NT, ND, BS+ Musculoskeletal: no deformities, strength intact in all 4 Skin: moist, warm, no rashes  ASSESSMENT: 1. DM2, insulin-dependent, uncontrolled, without complications - She does have kidney dysfunction, but no diabetic retinopathy, so I doubt that her kidney dysfunction is due to diabetes  2. HL  PLAN:  1. Patient with long-standing diabetes, with no apparent complications. No significant lows, but her sugars increase towards the end of the day - she misses insulin doses... Discussed about the importance of not missing doses. Patient Instructions  Please continue: - Lantus 35 units at bedtime - Amaryl 4 mg 2x a day Please increase: - NovoLog - try not to miss doses: - before a small meal: 6 units -  before a regular meal: 8 units  - before a larger meal (dinner): 10 units  Please return in 1.5 month with your sugar log.   - Will check a hemoglobin A1c at next visit - I will see her back in 1.5 months with her sugar log  2. HL - reviewed her Lipid levels - she is on Zocor - will recheck Lipids at next visit

## 2014-06-17 NOTE — Patient Instructions (Signed)
Please continue: - Lantus 35 units at bedtime - Amaryl 4 mg 2x a day Please increase: - NovoLog - try not to miss doses: - before a small meal: 6 units - before a regular meal: 8 units  - before a larger meal (dinner): 10 units  Please return in 1.5 month with your sugar log.

## 2014-07-03 ENCOUNTER — Telehealth: Payer: Self-pay | Admitting: Internal Medicine

## 2014-07-03 MED ORDER — BENZONATATE 200 MG PO CAPS: 200.0000 mg | ORAL_CAPSULE | Freq: Three times a day (TID) | ORAL | Status: DC | PRN

## 2014-07-03 MED ORDER — AZITHROMYCIN 250 MG PO TABS: ORAL_TABLET | ORAL | Status: DC

## 2014-07-03 NOTE — Telephone Encounter (Signed)
Pt seen in oct and diagnosed w/ pna. Pt has a cough, feels bad, weak, tired.  Pt states this has been going on since then.  Pt not given antibiotic, but states she needs one. Pt refused to make appt, stating she feels too bad to come in. Cough med too.  [facility]/ [place]

## 2014-07-03 NOTE — Telephone Encounter (Signed)
pls advise

## 2014-07-03 NOTE — Telephone Encounter (Signed)
Called and spoke with pt and pt is aware.  Rx sent to pharmacy.

## 2014-07-03 NOTE — Telephone Encounter (Signed)
Please call in azithromycin 250 mg #6 to take 2 daily for 3 consecutive days Generic Tessalon 200 mg #20 one 3 times a day Mucinex DM twice daily

## 2014-07-26 ENCOUNTER — Other Ambulatory Visit: Payer: Self-pay | Admitting: Internal Medicine

## 2014-07-29 ENCOUNTER — Ambulatory Visit: Payer: Commercial Managed Care - HMO | Admitting: Internal Medicine

## 2014-08-07 ENCOUNTER — Encounter: Payer: Self-pay | Admitting: Internal Medicine

## 2014-08-07 ENCOUNTER — Ambulatory Visit (INDEPENDENT_AMBULATORY_CARE_PROVIDER_SITE_OTHER): Payer: Commercial Managed Care - HMO | Admitting: Internal Medicine

## 2014-08-07 VITALS — BP 118/70 | HR 99 | Temp 98.2°F | Resp 12 | Wt 176.0 lb

## 2014-08-07 DIAGNOSIS — E7800 Pure hypercholesterolemia, unspecified: Secondary | ICD-10-CM

## 2014-08-07 DIAGNOSIS — E1121 Type 2 diabetes mellitus with diabetic nephropathy: Secondary | ICD-10-CM | POA: Diagnosis not present

## 2014-08-07 LAB — LIPID PANEL
CHOLESTEROL: 200 mg/dL (ref 0–200)
HDL: 45 mg/dL (ref >39.00)
NonHDL: 155
Total CHOL/HDL Ratio: 4
Triglycerides: 383 mg/dL — ABNORMAL HIGH (ref 0.0–149.0)
VLDL: 76.6 mg/dL — ABNORMAL HIGH (ref 0.0–40.0)

## 2014-08-07 LAB — COMPLETE METABOLIC PANEL WITH GFR
ALBUMIN: 4.1 g/dL (ref 3.5–5.2)
ALT: 16 U/L (ref 0–35)
AST: 26 U/L (ref 0–37)
Alkaline Phosphatase: 46 U/L (ref 39–117)
BILIRUBIN TOTAL: 0.4 mg/dL (ref 0.2–1.2)
BUN: 25 mg/dL — ABNORMAL HIGH (ref 6–23)
CHLORIDE: 100 meq/L (ref 96–112)
CO2: 30 mEq/L (ref 19–32)
Calcium: 9.8 mg/dL (ref 8.4–10.5)
Creat: 1.42 mg/dL — ABNORMAL HIGH (ref 0.50–1.10)
GFR, EST NON AFRICAN AMERICAN: 34 mL/min — AB
GFR, Est African American: 39 mL/min — ABNORMAL LOW
GLUCOSE: 158 mg/dL — AB (ref 70–99)
POTASSIUM: 4.9 meq/L (ref 3.5–5.3)
Sodium: 139 mEq/L (ref 135–145)
Total Protein: 7.6 g/dL (ref 6.0–8.3)

## 2014-08-07 LAB — HEMOGLOBIN A1C: HEMOGLOBIN A1C: 7.8 % — AB (ref 4.6–6.5)

## 2014-08-07 LAB — LDL CHOLESTEROL, DIRECT: LDL DIRECT: 107 mg/dL

## 2014-08-07 MED ORDER — INSULIN ASPART 100 UNIT/ML FLEXPEN: PEN_INJECTOR | SUBCUTANEOUS | Status: DC

## 2014-08-07 NOTE — Patient Instructions (Addendum)
Please continue Amaryl 4 mg 2x a day. Please continue Lantus 35 units at bedtime. Increase the Novolog as follows: - breakfast: 10 units - lunch: 10 units - dinner: 13 units  Try not to skip doses of insulin.  Try to also check sugars in the middle of the day.  Please return in 3 months with your sugar log.

## 2014-08-07 NOTE — Progress Notes (Signed)
Patient ID: [name] [name], female   DOB: 02/12/1929, 81 y.o.   MRN: [id number]  HPI: [name] [name] is a 81 y.o.-year-old female, returning for f/u for DM2, dx 2005, insulin-dependent, uncontrolled, without complications. Last visit 1.5 mo ago.  Last hemoglobin A1c was: Lab Results  Component Value Date   HGBA1C 8.5* 03/20/2014   HGBA1C 7.7* 12/18/2013   HGBA1C 7.6* 07/25/2013  previously 9.6%, previously 8.8% 2 years ago.  Pt is on a regimen of: - Lantus 40 >> 35 units qhs - injects in the abdomen mostly  - Amaryl 4 mg bid  - NovoLog - missing 1/3 doses a day: - before a small meal: 6 units - before a regular meal: 8 units  - before a larger meal (dinner): 10 units >> uses this for all meals We had to stop Metformin 2/2 increased creatinine.  Pt checks her sugars 1-3 x a day- brings log - still highs at night: - am: 62-122 >> 64-122 >> 70-155 >> 111-179 >> 95-160 >> 120-140 >> 110-155 >> 120-170, 190 - lunch: 170 >> 168 >> n/c  - before dinner: 127-168 >> 178 >> n/c - later: 140-266 >> 191-259 >> 173-260 >> 200-240 >> 135, 171-259, 272 >> 125, 160-280 No lows. Lowest sugar was 98. She has hypoglycemia awareness around 70. Highest 300.  Pt has chronic kidney disease stage 3-4, last BUN/creatinine was:  Lab Results  Component Value Date   BUN 29* 04/18/2013   CREATININE 1.6* 04/18/2013  Not taking Losartan. Last set of lipids: Lab Results  Component Value Date   CHOL 182 01/31/2012   HDL 48.60 01/31/2012   LDLDIRECT 85.2 01/31/2012   TRIG 320.0* 01/31/2012   CHOLHDL 4 01/31/2012  On Zocor 1/2 tab (20 mg) daily. Pt's last eye exam was in 08/2013, with Dr. Susa Loffler at [facility] ophthalmology. No DR.  Denies numbness and tingling in her legs.  ROS: Constitutional: no weight gain/loss, no fatigue, no subjective hyperthermia/hypothermia Eyes: no blurry vision, no xerophthalmia ENT: no sore throat, no nodules palpated in throat, no dysphagia/odynophagia,  no hoarseness, + tinnitus Cardiovascular: no CP/SOB/palpitations/leg swelling Respiratory: no cough/no SOB/wheezing Gastrointestinal: no N/V/D/C Musculoskeletal: + muscle aches/no joint aches Skin: no rashes Neurological: no tremors/numbness/tingling/dizziness  I reviewed pt's medications, allergies, PMH, social hx, family hx, and changes were documented in the history of present illness. Otherwise, unchanged from my initial visit note.  PE: BP 118/70 mmHg  Pulse 99  Temp(Src) 98.2 F (36.8 C) (Oral)  Resp 12  Wt 176 lb (79.833 kg)  SpO2 94% Wt Readings from Last 3 Encounters:  08/07/14 176 lb (79.833 kg)  06/17/14 177 lb 12.8 oz (80.65 kg)  04/25/14 175 lb (79.379 kg)   Constitutional: overweight, in NAD Eyes: PERRLA, EOMI, no exophthalmos ENT: moist mucous membranes, no thyromegaly, no cervical lymphadenopathy Cardiovascular: RRR, SEM 2/6, no RG Respiratory: CTA B Gastrointestinal: abdomen soft, NT, ND, BS+ Musculoskeletal: no deformities, strength intact in all 4 Skin: moist, warm, no rashes  ASSESSMENT: 1. DM2, insulin-dependent, uncontrolled, without complications - She does have kidney dysfunction, but no diabetic retinopathy, so I doubt that her kidney dysfunction is due to diabetes  2. HL  PLAN:  1. Patient with long-standing diabetes, with no apparent complications. No significant lows, but her sugars increase towards the end of the day - she misses insulin doses. Discussed about the importance of not missing doses and to check sugars mid-day, too. For now, we will only increase the dinnertime insulin:. Patient Instructions  Please continue Amaryl 4 mg 2x a day. Please continue Lantus 35 units at bedtime. Increase the Novolog as follows: - breakfast: 10 units - lunch: 10 units - dinner: 13 units  Try not to skip doses of insulin.  Try to also check sugars in the middle of the day.  Please return in 3 months with your sugar log.   - Will check a  hemoglobin A1c, Lipids and CMP today - I will see her back in 3 months with her sugar log  2. HL - reviewed her Lipid levels - she is on Zocor 20 mg daily - will recheck Lipids and LFTs today  Office Visit on 08/07/2014  Component Date Value Ref Range Status  . Hgb A1c MFr Bld 08/07/2014 7.8* 4.6 - 6.5 % Final   Glycemic Control Guidelines for People with Diabetes:Non Diabetic:  <6%Goal of Therapy: <7%Additional Action Suggested:  >8%   . Cholesterol 08/07/2014 200  0 - 200 mg/dL Final   ATP III Classification       Desirable:  < 200 mg/dL               Borderline High:  200 - 239 mg/dL          High:  > = 240 mg/dL  . Triglycerides 08/07/2014 383.0* 0.0 - 149.0 mg/dL Final   Normal:  <150 mg/dLBorderline High:  150 - 199 mg/dL  . HDL 08/07/2014 45.00  >39.00 mg/dL Final  . VLDL 08/07/2014 76.6* 0.0 - 40.0 mg/dL Final  . Total CHOL/HDL Ratio 08/07/2014 4   Final                  Men          Women1/2 Average Risk     3.4          3.3Average Risk          5.0          4.42X Average Risk          9.6          7.13X Average Risk          15.0          11.0                      . NonHDL 08/07/2014 155.00   Final   NOTE:  Non-HDL goal should be 30 mg/dL higher than patient's LDL goal (i.e. LDL goal of < 70 mg/dL, would have non-HDL goal of < 100 mg/dL)  . Sodium 08/07/2014 139  135 - 145 mEq/L Final  . Potassium 08/07/2014 4.9  3.5 - 5.3 mEq/L Final  . Chloride 08/07/2014 100  96 - 112 mEq/L Final  . CO2 08/07/2014 30  19 - 32 mEq/L Final  . Glucose, Bld 08/07/2014 158* 70 - 99 mg/dL Final  . BUN 08/07/2014 25* 6 - 23 mg/dL Final  . Creat 08/07/2014 1.42* 0.50 - 1.10 mg/dL Final  . Total Bilirubin 08/07/2014 0.4  0.2 - 1.2 mg/dL Final  . Alkaline Phosphatase 08/07/2014 46  39 - 117 U/L Final  . AST 08/07/2014 26  0 - 37 U/L Final  . ALT 08/07/2014 16  0 - 35 U/L Final  . Total Protein 08/07/2014 7.6  6.0 - 8.3 g/dL Final  . Albumin 08/07/2014 4.1  3.5 - 5.2 g/dL Final  . Calcium  08/07/2014 9.8  8.4 - 10.5 mg/dL Final  . GFR, Est African American  08/07/2014 39*  Final  . GFR, Est Non African American 08/07/2014 34*  Final   Comment:   The estimated GFR is a calculation valid for adults (>=83 years old) that uses the CKD-EPI algorithm to adjust for age and sex. It is   not to be used for children, pregnant women, hospitalized patients,    patients on dialysis, or with rapidly changing kidney function. According to the NKDEP, eGFR >89 is normal, 60-89 shows mild impairment, 30-59 shows moderate impairment, 15-29 shows severe impairment and <15 is ESRD.     Marland Kitchen Direct LDL 08/07/2014 107.0   Final   Optimal:  <100 mg/dLNear or Above Optimal:  100-129 mg/dLBorderline High:  130-159 mg/dLHigh:  160-189 mg/dLVery High:  >190 mg/dL   HbA1c improved. Creatinine improved. LDL close to goal. TG high, but nonfasting.

## 2014-08-29 DIAGNOSIS — Z961 Presence of intraocular lens: Secondary | ICD-10-CM | POA: Diagnosis not present

## 2014-08-29 DIAGNOSIS — E119 Type 2 diabetes mellitus without complications: Secondary | ICD-10-CM | POA: Diagnosis not present

## 2014-08-29 DIAGNOSIS — H52203 Unspecified astigmatism, bilateral: Secondary | ICD-10-CM | POA: Diagnosis not present

## 2014-08-29 LAB — HM DIABETES EYE EXAM

## 2014-09-04 ENCOUNTER — Encounter: Payer: Self-pay | Admitting: Internal Medicine

## 2014-09-30 ENCOUNTER — Other Ambulatory Visit: Payer: Self-pay | Admitting: Internal Medicine

## 2014-10-04 ENCOUNTER — Other Ambulatory Visit: Payer: Self-pay | Admitting: Internal Medicine

## 2014-10-06 ENCOUNTER — Other Ambulatory Visit: Payer: Self-pay | Admitting: *Deleted

## 2014-10-06 MED ORDER — GLIMEPIRIDE 4 MG PO TABS: 4.0000 mg | ORAL_TABLET | Freq: Two times a day (BID) | ORAL | Status: DC

## 2014-10-16 ENCOUNTER — Other Ambulatory Visit: Payer: Self-pay | Admitting: Internal Medicine

## 2014-11-05 ENCOUNTER — Other Ambulatory Visit: Payer: Self-pay | Admitting: Internal Medicine

## 2014-11-24 ENCOUNTER — Telehealth: Payer: Self-pay

## 2014-11-24 MED ORDER — INSULIN PEN NEEDLE 32G X 4 MM MISC: Status: DC

## 2014-11-24 NOTE — Telephone Encounter (Signed)
Rx sent 

## 2014-11-24 NOTE — Telephone Encounter (Signed)
[id number] - [place] ([place]), [place] - [address]: BD PEN NEEDLE NANO U/F 32G X 4 MM MISC

## 2014-12-30 ENCOUNTER — Other Ambulatory Visit: Payer: Self-pay | Admitting: Internal Medicine

## 2015-01-20 ENCOUNTER — Other Ambulatory Visit: Payer: Self-pay | Admitting: Internal Medicine

## 2015-01-20 NOTE — Telephone Encounter (Signed)
Patient is requesting a refill of Tramadol 50 mg, take one tablet every 8 hours as needed. [facility] [address] ([phone]) [phone] fax

## 2015-01-22 ENCOUNTER — Other Ambulatory Visit: Payer: Self-pay | Admitting: Internal Medicine

## 2015-01-28 DIAGNOSIS — L820 Inflamed seborrheic keratosis: Secondary | ICD-10-CM | POA: Diagnosis not present

## 2015-01-28 DIAGNOSIS — L821 Other seborrheic keratosis: Secondary | ICD-10-CM | POA: Diagnosis not present

## 2015-02-13 ENCOUNTER — Other Ambulatory Visit: Payer: Self-pay | Admitting: Internal Medicine

## 2015-02-26 ENCOUNTER — Encounter: Payer: Self-pay | Admitting: Internal Medicine

## 2015-02-26 ENCOUNTER — Ambulatory Visit (INDEPENDENT_AMBULATORY_CARE_PROVIDER_SITE_OTHER): Payer: Commercial Managed Care - HMO | Admitting: Internal Medicine

## 2015-02-26 VITALS — BP 130/72 | HR 99 | Temp 98.3°F | Resp 20 | Ht 63.5 in | Wt 183.0 lb

## 2015-02-26 DIAGNOSIS — I10 Essential (primary) hypertension: Secondary | ICD-10-CM | POA: Diagnosis not present

## 2015-02-26 DIAGNOSIS — G4700 Insomnia, unspecified: Secondary | ICD-10-CM

## 2015-02-26 DIAGNOSIS — E1121 Type 2 diabetes mellitus with diabetic nephropathy: Secondary | ICD-10-CM

## 2015-02-26 DIAGNOSIS — R5382 Chronic fatigue, unspecified: Secondary | ICD-10-CM

## 2015-02-26 LAB — CBC WITH DIFFERENTIAL/PLATELET
Basophils Absolute: 0.1 10*3/uL (ref 0.0–0.1)
Basophils Relative: 0.7 % (ref 0.0–3.0)
EOS PCT: 3.5 % (ref 0.0–5.0)
Eosinophils Absolute: 0.3 10*3/uL (ref 0.0–0.7)
HCT: 37.3 % (ref 36.0–46.0)
Hemoglobin: 12.5 g/dL (ref 12.0–15.0)
Lymphocytes Relative: 37 % (ref 12.0–46.0)
Lymphs Abs: 2.9 10*3/uL (ref 0.7–4.0)
MCHC: 33.5 g/dL (ref 30.0–36.0)
MCV: 93.9 fl (ref 78.0–100.0)
MONO ABS: 0.6 10*3/uL (ref 0.1–1.0)
MONOS PCT: 7.1 % (ref 3.0–12.0)
Neutro Abs: 4.1 10*3/uL (ref 1.4–7.7)
Neutrophils Relative %: 51.7 % (ref 43.0–77.0)
Platelets: 227 10*3/uL (ref 150.0–400.0)
RBC: 3.98 Mil/uL (ref 3.87–5.11)
RDW: 12.8 % (ref 11.5–15.5)
WBC: 7.8 10*3/uL (ref 4.0–10.5)

## 2015-02-26 LAB — COMPREHENSIVE METABOLIC PANEL
ALBUMIN: 3.8 g/dL (ref 3.5–5.2)
ALK PHOS: 49 U/L (ref 39–117)
ALT: 27 U/L (ref 0–35)
AST: 44 U/L — ABNORMAL HIGH (ref 0–37)
BUN: 24 mg/dL — ABNORMAL HIGH (ref 6–23)
CO2: 30 mEq/L (ref 19–32)
Calcium: 9.5 mg/dL (ref 8.4–10.5)
Chloride: 101 mEq/L (ref 96–112)
Creatinine, Ser: 1.51 mg/dL — ABNORMAL HIGH (ref 0.40–1.20)
GFR: 34.92 mL/min — AB (ref >60.00)
Glucose, Bld: 266 mg/dL — ABNORMAL HIGH (ref 70–99)
POTASSIUM: 5.2 meq/L — AB (ref 3.5–5.1)
Sodium: 138 mEq/L (ref 135–145)
TOTAL PROTEIN: 7.5 g/dL (ref 6.0–8.3)
Total Bilirubin: 0.3 mg/dL (ref 0.2–1.2)

## 2015-02-26 LAB — HEMOGLOBIN A1C: Hgb A1c MFr Bld: 8.7 % — ABNORMAL HIGH (ref 4.6–6.5)

## 2015-02-26 MED ORDER — TRAZODONE HCL 150 MG PO TABS
150.0000 mg | ORAL_TABLET | Freq: Every evening | ORAL | Status: DC | PRN
Start: 2015-02-26 — End: 2016-02-16

## 2015-02-26 NOTE — Progress Notes (Signed)
Pre visit review using our clinic review tool, if applicable. No additional management support is needed unless otherwise documented below in the visit note. 

## 2015-02-26 NOTE — Patient Instructions (Signed)
Please check your hemoglobin A1c every 3 months  Limit your sodium (Salt) intake    It is important that you exercise regularly, at least 20 minutes 3 to 4 times per week.  If you develop chest pain or shortness of breath seek  medical attention.  Return in 6 months for follow-up  Endocrinology follow-up as scheduled

## 2015-02-26 NOTE — Progress Notes (Signed)
Subjective:    Patient ID: [name] [name], female    DOB: 24-Jun-1929, 81 y.o.   MRN: [id number]  HPI 81 year old patient who is seen today in follow-up.  She is followed by endocrinology for type 2 diabetes.  Her last hemoglobin A1c was about 6 months ago and was improved at 7.8 She remains on basal insulin 36 units and also mealtime insulin.  She states that she often forgets her mealtime insulin and freely admits to poor dietary habits. Her main complaint is fatigue, which has been a chronic complaint in the past.  She sleeps poorly. She has a history of dyslipidemia.  Remains on simvastatin.  Past Medical History  Diagnosis Date  . DEGENERATIVE JOINT DISEASE, GENERALIZED 02/01/2007    Qualifier: Diagnosis of  By: Burnice Logan  MD, Doretha Sou   . DIARRHEA, CHRONIC 08/20/2009    Qualifier: Diagnosis of  By: Burnice Logan  MD, Doretha Sou   . DM 02/01/2007    Qualifier: Diagnosis of  By: Floyde Parkins    . FATIGUE 10/20/2008    Qualifier: Diagnosis of  By: Burnice Logan  MD, Doretha Sou   . HYPERCHOLESTEROLEMIA 02/01/2007    Qualifier: Diagnosis of  By: Floyde Parkins    . HYPERLIPIDEMIA 12/20/2007    Qualifier: Diagnosis of  By: Burnice Logan  MD, Doretha Sou   . HYPERTENSION 02/01/2007    Qualifier: Diagnosis of  By: Floyde Parkins    . LOW BACK PAIN 01/13/2010    Qualifier: Diagnosis of  By: Burnice Logan  MD, Doretha Sou MUSCLE CRAMPS 04/06/2010    Qualifier: Diagnosis of  By: Burnice Logan  MD, Doretha Sou   . MYALGIA 01/20/2010    Qualifier: Diagnosis of  By: Elease Hashimoto MD, [place]    . OSTEOARTHRITIS 01/13/2010    Qualifier: Diagnosis of  By: Burnice Logan  MD, Doretha Sou   . OVERACTIVE BLADDER 08/20/2009    Qualifier: Diagnosis of  By: Burnice Logan  MD, Doretha Sou   . PLEURISY 06/18/2008    Qualifier: Diagnosis of  By: Niel Hummer MD, [place] PNEUMONIA 06/18/2008    Qualifier: Diagnosis of  By: Niel Hummer MD, [place] Rectal prolapse 09/20/2007    Qualifier: Diagnosis of  By: Burnice Logan  MD, Doretha Sou   . VITAMIN  B12 DEFICIENCY 07/20/2010    Qualifier: Diagnosis of  By: Carlean Purl MD, [place] Millin Asthma     Social History   Social History  . Marital Status: Single    Spouse Name: N/A  . Number of Children: N/A  . Years of Education: N/A   Occupational History  . Not on file.   Social History Main Topics  . Smoking status: Former Smoker    Quit date: 07/04/1938  . Smokeless tobacco: Never Used  . Alcohol Use: No  . Drug Use: No  . Sexual Activity: Not on file   Other Topics Concern  . Not on file   Social History Narrative    Past Surgical History  Procedure Laterality Date  . Cholecystectomy    . Bladder surgery    . Cataract extraction Bilateral 2012  . Abdominal hysterectomy      partial- states she has her ovaries  . Appendectomy      Family History  Problem Relation Age of Onset  . Deep vein thrombosis Mother   . COPD Father   . Asthma Father   . Diabetes Neg Hx     family hx  .  Cancer Neg Hx     breast ca - sister(S)?  Marland Kitchen Heart disease Neg Hx     family hx    Allergies  Allergen Reactions  . Metformin     REACTION: diarrhea  . Sitagliptin Phosphate     REACTION: abdominal pain    Current Outpatient Prescriptions on File Prior to Visit  Medication Sig Dispense Refill  . Acetaminophen 650 MG TABS Take 1 tablet (650 mg total) by mouth 3 (three) times daily as needed. 30 tablet 0  . ADVOCATE REDI-CODE test strip   99  . albuterol (PROVENTIL,VENTOLIN) 90 MCG/ACT inhaler Inhale 2 puffs into the lungs every 6 (six) hours as needed. 34 g 3  . aspirin 81 MG tablet Take 81 mg by mouth daily.      . benzonatate (TESSALON) 200 MG capsule Take 1 capsule (200 mg total) by mouth 3 (three) times daily as needed for cough. 20 capsule 0  . Blood Glucose Calibration (ADVOCATE REDI-CODE+ CONTROL) LOW SOLN     . calcium-vitamin D (OSCAL WITH D 500-200) 500-200 MG-UNIT per tablet Take 1 tablet by mouth daily.      . cyanocobalamin 1000 MCG tablet Take 100 mcg by mouth  daily.      . fluticasone (FLONASE) 50 MCG/ACT nasal spray Place 1 spray into the nose daily. 16 g 0  . glimepiride (AMARYL) 4 MG tablet TAKE ONE TABLET BY MOUTH TWICE DAILY 180 tablet 0  . hydrochlorothiazide (MICROZIDE) 12.5 MG capsule Take 1 capsule (12.5 mg total) by mouth every morning. 90 capsule 3  . HYDROcodone-acetaminophen (NORCO/VICODIN) 5-325 MG per tablet TAKE ONE TABLET BY MOUTH EVERY 6 HOURS AS NEEDED FOR PAIN 90 tablet 0  . ibuprofen (ADVIL,MOTRIN) 600 MG tablet TAKE ONE TABLET BY MOUTH EVERY 6 HOURS AS NEEDED FOR PAIN 60 tablet 0  . insulin aspart (NOVOLOG FLEXPEN) 100 UNIT/ML FlexPen Inject 10-13 units into the skin before each of the 3 meals. Dx code: 250.02 15 mL 3  . Insulin Glargine (LANTUS SOLOSTAR) 100 UNIT/ML Solostar Pen INJECT 35 UNITS SUBCUTANEOUSLY DAILY AT 10 PM 15 mL 2  . Insulin Pen Needle (BD PEN NEEDLE NANO U/F) 32G X 4 MM MISC USE ONE AS DIRECTED  EVERY DAY AND AS NEEDED 100 each 12  . losartan (COZAAR) 100 MG tablet TAKE ONE TABLET BY MOUTH ONCE DAILY 90 tablet 0  . Multiple Vitamin (MULTIVITAMIN) tablet Take 1 tablet by mouth daily.      Marland Kitchen PHARMACIST CHOICE LANCETS MISC     . simvastatin (ZOCOR) 40 MG tablet TAKE ONE-HALF TABLET BY MOUTH AT BEDTIME 45 tablet 0  . traZODone (DESYREL) 100 MG tablet TAKE ONE TABLET BY MOUTH AT BEDTIME AS NEEDED 90 tablet 0  . venlafaxine XR (EFFEXOR-XR) 75 MG 24 hr capsule TAKE ONE CAPSULE BY MOUTH ONCE DAILY 90 capsule 0   No current facility-administered medications on file prior to visit.    BP 130/72 mmHg  Pulse 99  Temp(Src) 98.3 F (36.8 C) (Oral)  Resp 20  Ht 5' 3.5" (1.613 m)  Wt 183 lb (83.008 kg)  BMI 31.90 kg/m2  SpO2 96%    Review of Systems  Constitutional: Positive for fatigue.  HENT: Negative for congestion, dental problem, hearing loss, rhinorrhea, sinus pressure, sore throat and tinnitus.   Eyes: Negative for pain, discharge and visual disturbance.  Respiratory: Negative for cough and shortness of  breath.   Cardiovascular: Negative for chest pain, palpitations and leg swelling.  Gastrointestinal: Negative for nausea,  vomiting, abdominal pain, diarrhea, constipation, blood in stool and abdominal distention.  Genitourinary: Negative for dysuria, urgency, frequency, hematuria, flank pain, vaginal bleeding, vaginal discharge, difficulty urinating, vaginal pain and pelvic pain.  Musculoskeletal: Negative for joint swelling, arthralgias and gait problem.  Skin: Negative for rash.  Neurological: Negative for dizziness, syncope, speech difficulty, weakness, numbness and headaches.  Hematological: Negative for adenopathy.  Psychiatric/Behavioral: Positive for sleep disturbance. Negative for behavioral problems, dysphoric mood and agitation. The patient is not nervous/anxious.        Objective:   Physical Exam  Constitutional: She is oriented to person, place, and time. She appears well-developed and well-nourished.  HENT:  Head: Normocephalic.  Right Ear: External ear normal.  Left Ear: External ear normal.  Mouth/Throat: Oropharynx is clear and moist.  Eyes: Conjunctivae and EOM are normal. Pupils are equal, round, and reactive to light.  Neck: Normal range of motion. Neck supple. No thyromegaly present.  Cardiovascular: Normal rate, regular rhythm, normal heart sounds and intact distal pulses.   Pulmonary/Chest: Effort normal and breath sounds normal.  Abdominal: Soft. Bowel sounds are normal. She exhibits no mass. There is no tenderness.  Musculoskeletal: Normal range of motion.  Lymphadenopathy:    She has no cervical adenopathy.  Neurological: She is alert and oriented to person, place, and time.  Skin: Skin is warm and dry. No rash noted.  Psychiatric: She has a normal mood and affect. Her behavior is normal.          Assessment & Plan:   Diabetes mellitus.  We'll check a hemoglobin A1c.  Compliance issues discussed.  Patient did have a I examination in February of this  year that was free of diabetic retinopathy Hypertension, stable.  Repeat blood pressure 120/64 Fatigue, probably at least in part related to insomnia.  Will increase trazodone to 200 mg at bedtime Dyslipidemia.  Continue statin therapy  Follow-up endocrinology  Check screening lab

## 2015-03-02 ENCOUNTER — Other Ambulatory Visit: Payer: Self-pay | Admitting: Internal Medicine

## 2015-03-19 ENCOUNTER — Ambulatory Visit: Payer: Medicare HMO | Admitting: Internal Medicine

## 2015-03-21 ENCOUNTER — Other Ambulatory Visit: Payer: Self-pay | Admitting: Internal Medicine

## 2015-03-24 ENCOUNTER — Encounter: Payer: Self-pay | Admitting: Internal Medicine

## 2015-03-24 ENCOUNTER — Other Ambulatory Visit: Payer: Self-pay | Admitting: *Deleted

## 2015-03-24 ENCOUNTER — Ambulatory Visit (INDEPENDENT_AMBULATORY_CARE_PROVIDER_SITE_OTHER): Payer: Commercial Managed Care - HMO | Admitting: Internal Medicine

## 2015-03-24 VITALS — BP 130/68 | HR 87 | Temp 98.3°F | Resp 12 | Wt 182.8 lb

## 2015-03-24 DIAGNOSIS — E1121 Type 2 diabetes mellitus with diabetic nephropathy: Secondary | ICD-10-CM

## 2015-03-24 MED ORDER — INSULIN PEN NEEDLE 32G X 4 MM MISC: Status: DC

## 2015-03-24 NOTE — Patient Instructions (Addendum)
  Patient Instructions  Please continue: - Amaryl 4 mg 2x a day  Please continue: - Lantus 36 units at bedtime - NovoLog 12 units with a regular meal, but increase to 15 units for a larger meal.  Please return in 3 months with your sugar log.

## 2015-03-24 NOTE — Telephone Encounter (Signed)
Pt stated she had a cough while here in our office today. Pt requested a refill of Tessalon pearl cough pills. Be advised.

## 2015-03-24 NOTE — Progress Notes (Signed)
Patient ID: [name] [name], female   DOB: April 22, 1929, 81 y.o.   MRN: [id number]  HPI: [name] [name] is a 81 y.o.-year-old female, returning for f/u for DM2, dx 2005, insulin-dependent, uncontrolled, without complications. Last visit 7 mo ago! No log, no meter.   Last hemoglobin A1c was: Lab Results  Component Value Date   HGBA1C 8.7* 02/26/2015   HGBA1C 7.8* 08/07/2014   HGBA1C 8.5* 03/20/2014  previously 9.6%, previously 8.8% 2 years ago.  Pt is on a regimen of: - Lantus 36-38 units qhs - Amaryl 4 mg bid  - NovoLog - missing 1/3 doses a day - when she does not forget, sugars better: - breakfast: 12 units - lunch: 12 units - dinner: 12 units We had to stop Metformin 2/2 increased creatinine.  Pt checks her sugars 1-3 x a day: - am: 62-122 >> 64-122 >> 70-155 >> 111-179 >> 95-160 >> 120-140 >> 110-155 >> 120-170, 190 >> 130-180 - lunch: 170 >> 168 >> n/c  - before dinner: 127-168 >> 178 >> n/c - later: 140-266 >> 191-259 >> 173-260 >> 200-240 >> 135, 171-259, 272 >> 125, 160-280 >> 200s No lows. Lowest sugar was 120. She has hypoglycemia awareness around 70. Highest 306.  Pt has chronic kidney disease stage 3-4, last BUN/creatinine was:  Lab Results  Component Value Date   BUN 24* 02/26/2015   CREATININE 1.51* 02/26/2015  Not taking Losartan. Last set of lipids: Lab Results  Component Value Date   CHOL 200 08/07/2014   HDL 45.00 08/07/2014   LDLDIRECT 107.0 08/07/2014   TRIG 383.0* 08/07/2014   CHOLHDL 4 08/07/2014  On Zocor 1/2 tab (20 mg) daily. Pt's last eye exam was in 08/29/2014, with Dr. Susa Loffler at [facility] ophthalmology. No DR.  Denies numbness and tingling in her legs.  ROS: Constitutional: no weight gain/loss, no fatigue, no subjective hyperthermia/hypothermia, + poor sleep Eyes: no blurry vision, no xerophthalmia ENT: no sore throat, no nodules palpated in throat, no dysphagia/odynophagia, no hoarseness Cardiovascular: no  CP/SOB/palpitations/leg swelling Respiratory: + cough/no SOB/wheezing Gastrointestinal: no N/V/D/C Musculoskeletal: no muscle aches/no joint aches Skin: no rashes Neurological: no tremors/numbness/tingling/dizziness  I reviewed pt's medications, allergies, PMH, social hx, family hx, and changes were documented in the history of present illness. Otherwise, unchanged from my initial visit note.  PE: BP 130/68 mmHg  Pulse 87  Temp(Src) 98.3 F (36.8 C) (Oral)  Resp 12  Wt 182 lb 12.8 oz (82.918 kg)  SpO2 96% Body mass index is 31.87 kg/(m^2). Wt Readings from Last 3 Encounters:  03/24/15 182 lb 12.8 oz (82.918 kg)  02/26/15 183 lb (83.008 kg)  08/07/14 176 lb (79.833 kg)   Constitutional: overweight, in NAD Eyes: PERRLA, EOMI, no exophthalmos ENT: moist mucous membranes, no thyromegaly, no cervical lymphadenopathy Cardiovascular: RRR, SEM 2/6, no RG Respiratory: CTA B Gastrointestinal: abdomen soft, NT, ND, BS+ Musculoskeletal: no deformities, strength intact in all 4 Skin: moist, warm, no rashes  ASSESSMENT: 1. DM2, insulin-dependent, uncontrolled, without complications - She does have kidney dysfunction, but no diabetic retinopathy, so I doubt that her kidney dysfunction is due to diabetes  2. HL  PLAN:  1. Patient with long-standing diabetes, with no apparent complications. No significant lows, but her sugars increase towards the end of the day as she misses insulin doses. Discussed about the importance of not missing doses - alarms on phone, putting insulin on the table.  - For now, we will only increase the dinnertime insulin: Patient Instructions  Please  continue: - Amaryl 4 mg 2x a day  Please continue: - Lantus 36 units at bedtime - NovoLog 12 units with a regular meal, but increase to 15 units for a larger meal.  Please return in 3 months with your sugar log.   - she got her flu shot this month - I will see her back in 3 months with her sugar log

## 2015-03-25 NOTE — Telephone Encounter (Signed)
Okay to refill Tessalon capsules?

## 2015-03-25 NOTE — Telephone Encounter (Signed)
ok 

## 2015-03-30 MED ORDER — BENZONATATE 200 MG PO CAPS: 200.0000 mg | ORAL_CAPSULE | Freq: Three times a day (TID) | ORAL | Status: DC | PRN

## 2015-04-04 ENCOUNTER — Other Ambulatory Visit: Payer: Self-pay | Admitting: Internal Medicine

## 2015-04-06 ENCOUNTER — Other Ambulatory Visit: Payer: Self-pay

## 2015-04-06 ENCOUNTER — Telehealth: Payer: Self-pay | Admitting: Internal Medicine

## 2015-04-06 MED ORDER — GLUCOSE BLOOD VI STRP: ORAL_STRIP | Status: DC

## 2015-04-06 NOTE — Telephone Encounter (Signed)
Pt needs refill on accucheck aviva plus t test strips called into [facility] on [address]

## 2015-04-20 ENCOUNTER — Other Ambulatory Visit: Payer: Self-pay | Admitting: Internal Medicine

## 2015-05-09 ENCOUNTER — Other Ambulatory Visit: Payer: Self-pay | Admitting: Internal Medicine

## 2015-06-02 ENCOUNTER — Other Ambulatory Visit: Payer: Self-pay | Admitting: Internal Medicine

## 2015-06-03 ENCOUNTER — Other Ambulatory Visit: Payer: Self-pay | Admitting: Internal Medicine

## 2015-06-08 ENCOUNTER — Telehealth: Payer: Self-pay | Admitting: Internal Medicine

## 2015-06-08 ENCOUNTER — Encounter: Payer: Self-pay | Admitting: Internal Medicine

## 2015-06-08 ENCOUNTER — Ambulatory Visit (INDEPENDENT_AMBULATORY_CARE_PROVIDER_SITE_OTHER): Payer: Commercial Managed Care - HMO | Admitting: Internal Medicine

## 2015-06-08 VITALS — BP 146/74 | HR 87 | Temp 99.1°F | Resp 20 | Ht 63.5 in | Wt 183.0 lb

## 2015-06-08 DIAGNOSIS — M150 Primary generalized (osteo)arthritis: Secondary | ICD-10-CM

## 2015-06-08 DIAGNOSIS — I10 Essential (primary) hypertension: Secondary | ICD-10-CM

## 2015-06-08 DIAGNOSIS — Z794 Long term (current) use of insulin: Secondary | ICD-10-CM

## 2015-06-08 DIAGNOSIS — E1121 Type 2 diabetes mellitus with diabetic nephropathy: Secondary | ICD-10-CM | POA: Diagnosis not present

## 2015-06-08 DIAGNOSIS — E7800 Pure hypercholesterolemia, unspecified: Secondary | ICD-10-CM

## 2015-06-08 DIAGNOSIS — M545 Low back pain, unspecified: Secondary | ICD-10-CM

## 2015-06-08 DIAGNOSIS — M159 Polyosteoarthritis, unspecified: Secondary | ICD-10-CM

## 2015-06-08 DIAGNOSIS — M8949 Other hypertrophic osteoarthropathy, multiple sites: Secondary | ICD-10-CM

## 2015-06-08 DIAGNOSIS — G8929 Other chronic pain: Secondary | ICD-10-CM

## 2015-06-08 LAB — HEMOGLOBIN A1C: Hgb A1c MFr Bld: 9.1 % — ABNORMAL HIGH (ref 4.6–6.5)

## 2015-06-08 MED ORDER — ALBUTEROL SULFATE 108 (90 BASE) MCG/ACT IN AEPB: 2.0000 "application " | INHALATION_SPRAY | Freq: Four times a day (QID) | RESPIRATORY_TRACT | Status: DC | PRN

## 2015-06-08 MED ORDER — TRAMADOL HCL 50 MG PO TABS: 50.0000 mg | ORAL_TABLET | Freq: Four times a day (QID) | ORAL | Status: DC | PRN

## 2015-06-08 NOTE — Progress Notes (Signed)
Subjective:    Patient ID: [name] [name], female    DOB: 12/21/1928, 81 y.o.   MRN: [id number]  HPI  Lab Results  Component Value Date   HGBA1C 8.7* 02/26/2015    BP Readings from Last 3 Encounters:  06/08/15 146/74  03/24/15 130/68  02/26/15 130/72    Wt Readings from Last 3 Encounters:  06/08/15 183 lb (83.008 kg)  03/24/15 182 lb 12.8 oz (82.918 kg)  02/26/15 183 lb (83.76 kg)   81 year old patient who is seen today which she complains of low back pain as well as right shoulder and arm discomfort.  She does have a history of osteoarthritis.  She is on ibuprofen as well as hydrocodone. She has type 2 diabetes and is scheduled for endocrine follow-up in about 2 weeks. She has dyslipidemia and essential hypertension.  \ Past Medical History  Diagnosis Date  . DEGENERATIVE JOINT DISEASE, GENERALIZED 02/01/2007    Qualifier: Diagnosis of  By: Burnice Logan  MD, Doretha Sou   . DIARRHEA, CHRONIC 08/20/2009    Qualifier: Diagnosis of  By: Burnice Logan  MD, Doretha Sou   . DM 02/01/2007    Qualifier: Diagnosis of  By: Floyde Parkins    . FATIGUE 10/20/2008    Qualifier: Diagnosis of  By: Burnice Logan  MD, Doretha Sou   . HYPERCHOLESTEROLEMIA 02/01/2007    Qualifier: Diagnosis of  By: Floyde Parkins    . HYPERLIPIDEMIA 12/20/2007    Qualifier: Diagnosis of  By: Burnice Logan  MD, Doretha Sou   . HYPERTENSION 02/01/2007    Qualifier: Diagnosis of  By: Floyde Parkins    . LOW BACK PAIN 01/13/2010    Qualifier: Diagnosis of  By: Burnice Logan  MD, Doretha Sou MUSCLE CRAMPS 04/06/2010    Qualifier: Diagnosis of  By: Burnice Logan  MD, Doretha Sou   . MYALGIA 01/20/2010    Qualifier: Diagnosis of  By: Elease Hashimoto MD, [place]    . OSTEOARTHRITIS 01/13/2010    Qualifier: Diagnosis of  By: Burnice Logan  MD, Doretha Sou   . OVERACTIVE BLADDER 08/20/2009    Qualifier: Diagnosis of  By: Burnice Logan  MD, Doretha Sou   . PLEURISY 06/18/2008    Qualifier: Diagnosis of  By: Niel Hummer MD, [place] PNEUMONIA 06/18/2008    Qualifier:  Diagnosis of  By: Niel Hummer MD, [place] Rectal prolapse 09/20/2007    Qualifier: Diagnosis of  By: Burnice Logan  MD, Doretha Sou   . VITAMIN B12 DEFICIENCY 07/20/2010    Qualifier: Diagnosis of  By: Carlean Purl MD, Dimas Millin Asthma     Social History   Social History  . Marital Status: Single    Spouse Name: N/A  . Number of Children: N/A  . Years of Education: N/A   Occupational History  . Not on file.   Social History Main Topics  . Smoking status: Former Smoker    Quit date: 07/04/1938  . Smokeless tobacco: Never Used  . Alcohol Use: No  . Drug Use: No  . Sexual Activity: Not on file   Other Topics Concern  . Not on file   Social History Narrative    Past Surgical History  Procedure Laterality Date  . Cholecystectomy    . Bladder surgery    . Cataract extraction Bilateral 2012  . Abdominal hysterectomy      partial- states she has her ovaries  . Appendectomy      Family History  Problem  Relation Age of Onset  . Deep vein thrombosis Mother   . COPD Father   . Asthma Father   . Diabetes Neg Hx     family hx  . Cancer Neg Hx     breast ca - sister(S)?  Marland Kitchen Heart disease Neg Hx     family hx    Allergies  Allergen Reactions  . Metformin     REACTION: diarrhea  . Sitagliptin Phosphate     REACTION: abdominal pain    Current Outpatient Prescriptions on File Prior to Visit  Medication Sig Dispense Refill  . Acetaminophen 650 MG TABS Take 1 tablet (650 mg total) by mouth 3 (three) times daily as needed. 30 tablet 0  . ADVOCATE REDI-CODE test strip   99  . albuterol (PROVENTIL,VENTOLIN) 90 MCG/ACT inhaler Inhale 2 puffs into the lungs every 6 (six) hours as needed. 34 g 3  . aspirin 81 MG tablet Take 81 mg by mouth daily.      . benzonatate (TESSALON) 200 MG capsule Take 1 capsule (200 mg total) by mouth 3 (three) times daily as needed for cough. 20 capsule 1  . Blood Glucose Calibration (ADVOCATE REDI-CODE+ CONTROL) LOW SOLN     . calcium-vitamin D  (OSCAL WITH D 500-200) 500-200 MG-UNIT per tablet Take 1 tablet by mouth daily.      . cyanocobalamin 1000 MCG tablet Take 100 mcg by mouth daily.      . fluticasone (FLONASE) 50 MCG/ACT nasal spray Place 1 spray into the nose daily. 16 g 0  . glimepiride (AMARYL) 4 MG tablet TAKE ONE TABLET BY MOUTH TWICE DAILY 180 tablet 0  . glucose blood (ACCU-CHEK AVIVA) test strip Use as instructed 100 each 1  . hydrochlorothiazide (MICROZIDE) 12.5 MG capsule Take 1 capsule (12.5 mg total) by mouth every morning. 90 capsule 3  . HYDROcodone-acetaminophen (NORCO/VICODIN) 5-325 MG per tablet TAKE ONE TABLET BY MOUTH EVERY 6 HOURS AS NEEDED FOR PAIN 90 tablet 0  . ibuprofen (ADVIL,MOTRIN) 600 MG tablet TAKE ONE TABLET BY MOUTH EVERY 6 HOURS AS NEEDED FOR PAIN 60 tablet 0  . insulin aspart (NOVOLOG FLEXPEN) 100 UNIT/ML FlexPen Inject 10-13 units into the skin before each of the 3 meals. Dx code: 250.02 15 mL 3  . insulin aspart (NOVOLOG FLEXPEN) 100 UNIT/ML FlexPen Inject 12-15 Units into the skin 3 (three) times daily with meals. 15 pen 0  . Insulin Glargine (LANTUS SOLOSTAR) 100 UNIT/ML Solostar Pen Inject 36 Units into the skin daily at 10 pm. 15 mL 1  . Insulin Pen Needle (BD PEN NEEDLE NANO U/F) 32G X 4 MM MISC USE TO INJECT INSULIN 4 TIMES DAILY AS DIRECTED. 130 each 11  . losartan (COZAAR) 100 MG tablet TAKE ONE TABLET BY MOUTH ONCE DAILY 90 tablet 0  . losartan (COZAAR) 100 MG tablet TAKE ONE TABLET BY MOUTH ONCE DAILY 90 tablet 0  . Multiple Vitamin (MULTIVITAMIN) tablet Take 1 tablet by mouth daily.      Marland Kitchen PHARMACIST CHOICE LANCETS MISC     . simvastatin (ZOCOR) 40 MG tablet TAKE ONE-HALF TABLET BY MOUTH AT BEDTIME 45 tablet 0  . traZODone (DESYREL) 150 MG tablet Take 1 tablet (150 mg total) by mouth at bedtime as needed. 90 tablet 3  . venlafaxine XR (EFFEXOR-XR) 75 MG 24 hr capsule TAKE ONE CAPSULE BY MOUTH ONCE DAILY 90 capsule 0   No current facility-administered medications on file prior to  visit.    BP 146/74 mmHg  Pulse 87  Temp(Src) 99.1 F (37.3 C) (Oral)  Resp 20  Ht 5' 3.5" (1.613 m)  Wt 183 lb (83.008 kg)  BMI 31.90 kg/m2  SpO2 95%     Review of Systems  Constitutional: Negative.   HENT: Negative for congestion, dental problem, hearing loss, rhinorrhea, sinus pressure, sore throat and tinnitus.   Eyes: Negative for pain, discharge and visual disturbance.  Respiratory: Positive for shortness of breath. Negative for cough.   Cardiovascular: Negative for chest pain, palpitations and leg swelling.  Gastrointestinal: Negative for nausea, vomiting, abdominal pain, diarrhea, constipation, blood in stool and abdominal distention.  Genitourinary: Negative for dysuria, urgency, frequency, hematuria, flank pain, vaginal bleeding, vaginal discharge, difficulty urinating, vaginal pain and pelvic pain.  Musculoskeletal: Positive for back pain, arthralgias, neck pain and neck stiffness. Negative for joint swelling and gait problem.  Skin: Negative for rash.  Neurological: Negative for dizziness, syncope, speech difficulty, weakness, numbness and headaches.  Hematological: Negative for adenopathy.  Psychiatric/Behavioral: Negative for behavioral problems, dysphoric mood and agitation. The patient is not nervous/anxious.        Objective:   Physical Exam  Constitutional: She is oriented to person, place, and time. She appears well-developed and well-nourished.  HENT:  Head: Normocephalic.  Right Ear: External ear normal.  Left Ear: External ear normal.  Mouth/Throat: Oropharynx is clear and moist.  Eyes: Conjunctivae and EOM are normal. Pupils are equal, round, and reactive to light.  Neck: Normal range of motion. Neck supple. No thyromegaly present.  Cardiovascular: Normal rate, regular rhythm, normal heart sounds and intact distal pulses.   Pulmonary/Chest: Effort normal and breath sounds normal. No respiratory distress. She has no wheezes. She has no rales.    Abdominal: Soft. Bowel sounds are normal. She exhibits no mass. There is no tenderness.  Musculoskeletal: Normal range of motion.  Lymphadenopathy:    She has no cervical adenopathy.  Neurological: She is alert and oriented to person, place, and time.  Skin: Skin is warm and dry. No rash noted.  Psychiatric: She has a normal mood and affect. Her behavior is normal.          Assessment & Plan:   Osteoarthritis.  Will try a regimen of tramadol 3 times a day with meals.  We'll attempt to minimize anti-inflammatory medications.  In view of her chronic kidney disease Essential hypertension, stable Dyslipidemia Diabetes mellitus.  Will check a hemoglobin A1c.  Endocrine follow-up as scheduled  Recheck 6 months

## 2015-06-08 NOTE — Telephone Encounter (Signed)
Noted  

## 2015-06-08 NOTE — Telephone Encounter (Signed)
FYI. Seeing Dr. Raliegh Ip at 1:45

## 2015-06-08 NOTE — Telephone Encounter (Signed)
Patient Name: [name] [name]  DOB: Feb 22, 1929    Initial Comment Caller states her back and hip are hurting. Right arm pain. She doesn't feel right.   Nurse Assessment  Nurse: Raphael Gibney, RN, Vanita Ingles Date/Time (Eastern Time): 06/08/2015 11:44:02 AM  Confirm and document reason for call. If symptomatic, describe symptoms. ---Caller states she is having back pain and right pain. She is having right arm pain. She had to go home from church yesterday due to pain. Took 1200 mg of ibuprofen which helped the pain a little. No SOB or chest pain.  Has the patient traveled out of the country within the last 30 days? ---Not Applicable  Does the patient have any new or worsening symptoms? ---Yes  Will a triage be completed? ---Yes  Related visit to physician within the last 2 weeks? ---No  Does the PT have any chronic conditions? (i.e. diabetes, asthma, etc.) ---Yes  List chronic conditions. ---diabetes  Is this a behavioral health or substance abuse call? ---No     Guidelines    Guideline Title Affirmed Question Affirmed Notes  Hip Pain [1] MODERATE pain (e.g., interferes with normal activities, limping) AND [2] present > 3 days    Final Disposition User   See PCP When Office is Open (within 3 days) Raphael Gibney, RN, Vanita Ingles    Comments  Appt scheduled for 06/08/2015 at 1:45 pm with Dr. Simonne Martinet  Pt is having right hip pain.   Referrals  REFERRED TO PCP OFFICE   Disagree/Comply: Comply

## 2015-06-08 NOTE — Progress Notes (Signed)
Pre visit review using our clinic review tool, if applicable. No additional management support is needed unless otherwise documented below in the visit note. 

## 2015-06-09 LAB — MICROALBUMIN / CREATININE URINE RATIO
CREATININE, U: 102.5 mg/dL
Microalb Creat Ratio: 37.5 mg/g — ABNORMAL HIGH (ref 0.0–30.0)
Microalb, Ur: 38.4 mg/dL — ABNORMAL HIGH (ref 0.0–1.9)

## 2015-06-23 ENCOUNTER — Encounter: Payer: Self-pay | Admitting: Internal Medicine

## 2015-06-23 ENCOUNTER — Ambulatory Visit (INDEPENDENT_AMBULATORY_CARE_PROVIDER_SITE_OTHER): Payer: Commercial Managed Care - HMO | Admitting: Internal Medicine

## 2015-06-23 VITALS — BP 128/72 | HR 91 | Temp 98.0°F | Resp 12 | Wt 181.8 lb

## 2015-06-23 DIAGNOSIS — Z794 Long term (current) use of insulin: Secondary | ICD-10-CM | POA: Diagnosis not present

## 2015-06-23 DIAGNOSIS — E1121 Type 2 diabetes mellitus with diabetic nephropathy: Secondary | ICD-10-CM | POA: Diagnosis not present

## 2015-06-23 MED ORDER — INSULIN GLARGINE 100 UNIT/ML SOLOSTAR PEN: 40.0000 [IU] | PEN_INJECTOR | Freq: Every day | SUBCUTANEOUS | Status: DC

## 2015-06-23 MED ORDER — INSULIN ASPART 100 UNIT/ML FLEXPEN
PEN_INJECTOR | SUBCUTANEOUS | Status: DC
Start: 2015-06-23 — End: 2015-09-22

## 2015-06-23 NOTE — Progress Notes (Signed)
Patient ID: LADAIJAH [name], female   DOB: 11/24/1928, 81 y.o.   MRN: [id number]  HPI: [name] [name] is a 81 y.o.-year-old female, returning for f/u for DM2, dx 2005, insulin-dependent, uncontrolled, without complications. Last visit 3 mo ago!  Last hemoglobin A1c was: Lab Results  Component Value Date   HGBA1C 9.1* 06/08/2015   HGBA1C 8.7* 02/26/2015   HGBA1C 7.8* 08/07/2014  previously 9.6%, previously 8.8% 2 years ago.  Pt is on a regimen of: - Amaryl 4 mg bid  - Lantus 36 units qhs - NovoLog - missing 1/3 doses a day ([facility] the senior center and does not take NovoLog with her): - small meal: 12 units - large meal: 15 units We had to stop Metformin 2/2 increased creatinine.  Pt checks her sugars 2-3x a day. Reviewed log: - am: 64-122 >> 70-155 >> 111-179 >> 95-160 >> 120-140 >> 110-155 >> 120-170, 190 >> 130-180 >> 112-176 - 2h after b'fast: 140-192 - lunch: 170 >> 168 >> n/c >> 178-214, 248 - 2h after lunch: 299 - before dinner: 127-168 >> 178 >> n/c - later: 140-266 >> 191-259 >> 173-260 >> 200-240 >> 135, 171-259, 272 >> 125, 160-280 >> 200s >> 164-302, 380 No lows. Lowest sugar was 120>> 112. She has hypoglycemia awareness around 70. Highest 380.  Pt has chronic kidney disease stage 3-4, last BUN/creatinine was:  Lab Results  Component Value Date   BUN 24* 02/26/2015   CREATININE 1.51* 02/26/2015  Not taking Losartan. Last set of lipids: Lab Results  Component Value Date   CHOL 200 08/07/2014   HDL 45.00 08/07/2014   LDLDIRECT 107.0 08/07/2014   TRIG 383.0* 08/07/2014   CHOLHDL 4 08/07/2014  On Zocor 1/2 tab (20 mg) daily. Pt's last eye exam was in 08/29/2014, with Dr. Susa Loffler at [facility]. No DR.  Denies numbness and tingling in her legs.  ROS: Constitutional: no weight gain/loss, + fatigue, no subjective hyperthermia/hypothermia Eyes: no blurry vision, no xerophthalmia ENT: no sore throat, no nodules palpated in throat,  no dysphagia/odynophagia, no hoarseness Cardiovascular: no CP/SOB/palpitations/leg swelling Respiratory: no cough/no SOB/wheezing Gastrointestinal: no N/V/D/C Musculoskeletal: + muscle aches/no joint aches Skin: no rashes Neurological: no tremors/numbness/tingling/dizziness  I reviewed pt's medications, allergies, PMH, social hx, family hx, and changes were documented in the history of present illness. Otherwise, unchanged from my initial visit note.  PE: BP 128/72 mmHg  Pulse 91  Temp(Src) 98 F (36.7 C) (Oral)  Resp 12  Wt 181 lb 12.8 oz (82.464 kg)  SpO2 94% Body mass index is 31.7 kg/(m^2). Wt Readings from Last 3 Encounters:  06/23/15 181 lb 12.8 oz (82.464 kg)  06/08/15 183 lb (83.008 kg)  03/24/15 182 lb 12.8 oz (82.918 kg)   Constitutional: overweight, in NAD Eyes: PERRLA, EOMI, no exophthalmos ENT: moist mucous membranes, no thyromegaly, no cervical lymphadenopathy Cardiovascular: RRR, SEM 2/6, no RG Respiratory: CTA B Gastrointestinal: abdomen soft, NT, ND, BS+ Musculoskeletal: no deformities, strength intact in all 4 Skin: moist, warm, no rashes  ASSESSMENT: 1. DM2, insulin-dependent, uncontrolled, without complications - She does have kidney dysfunction, but no diabetic retinopathy, so I doubt that her kidney dysfunction is due to diabetes  2. HL  PLAN:  1. Patient with long-standing diabetes, with no apparent complications. Sugars increase towards the end of the day as she misses lunch insulin doses. Discussed about the importance of not missing doses - take insulin with her at the senior center. As sugars higher overall >>  increase both Lantus and Novolog. - she will switch to [place] next year - I advised her to:   Patient Instructions  Please continue: - Amaryl 4 mg 2x a day  Please increase: - Lantus to 40 units at bedtime - NovoLog:  15 units with a regular meal  18 units for a larger meal  Please return in 3 months with your sugar log (after  March 5th).  - she got her flu shot this season - UTD with eye exams - I will see her back in 3 months with her sugar log

## 2015-06-23 NOTE — Patient Instructions (Signed)
  Patient Instructions  Please continue: - Amaryl 4 mg 2x a day  Please increase: - Lantus to 40 units at bedtime - NovoLog:  15 units with a regular meal  18 units for a larger meal  Please return in 3 months with your sugar log (after March 5th).

## 2015-07-11 ENCOUNTER — Other Ambulatory Visit: Payer: Self-pay | Admitting: Internal Medicine

## 2015-07-26 ENCOUNTER — Other Ambulatory Visit: Payer: Self-pay | Admitting: Internal Medicine

## 2015-08-08 ENCOUNTER — Other Ambulatory Visit: Payer: Self-pay | Admitting: Internal Medicine

## 2015-08-11 ENCOUNTER — Emergency Department (HOSPITAL_COMMUNITY)
Admission: EM | Admit: 2015-08-11 | Discharge: 2015-08-11 | Disposition: A | Discharge status: Home / Self Care | Payer: Medicare Other | Source: Home / Self Care | Attending: Family Medicine | Admitting: Family Medicine

## 2015-08-11 ENCOUNTER — Encounter (HOSPITAL_COMMUNITY): Payer: Self-pay | Admitting: Emergency Medicine

## 2015-08-11 ENCOUNTER — Emergency Department (INDEPENDENT_AMBULATORY_CARE_PROVIDER_SITE_OTHER): Payer: Medicare Other

## 2015-08-11 DIAGNOSIS — M25562 Pain in left knee: Secondary | ICD-10-CM | POA: Diagnosis not present

## 2015-08-11 DIAGNOSIS — G8929 Other chronic pain: Secondary | ICD-10-CM

## 2015-08-11 MED ORDER — TRAMADOL HCL 50 MG PO TABS: 50.0000 mg | ORAL_TABLET | Freq: Four times a day (QID) | ORAL | Status: DC | PRN

## 2015-08-11 NOTE — ED Provider Notes (Signed)
CSN: [id number]     Arrival date & time 08/11/15  1512 History   First MD Initiated Contact with Patient 08/11/15 1638     Chief Complaint  Patient presents with  . Knee Pain   (Consider location/radiation/quality/duration/timing/severity/associated sxs/prior Treatment) HPI History obtained from patient:   LOCATION:left knee SEVERITY:hurts a great deal DURATION:over 1 month CONTEXT:1 month ago fell onto both knees while walking down steps hitting cement QUALITY:chronic ache MODIFYING FACTORS:tylenol and ibuprofen, not helping ASSOCIATED SYMPTOMS:none TIMING:constant OCCUPATION:  Past Medical History  Diagnosis Date  . DEGENERATIVE JOINT DISEASE, GENERALIZED 02/01/2007    Qualifier: Diagnosis of  By: Burnice Logan  MD, Doretha Sou   . DIARRHEA, CHRONIC 08/20/2009    Qualifier: Diagnosis of  By: Burnice Logan  MD, Doretha Sou   . DM 02/01/2007    Qualifier: Diagnosis of  By: Floyde Parkins    . FATIGUE 10/20/2008    Qualifier: Diagnosis of  By: Burnice Logan  MD, Doretha Sou   . HYPERCHOLESTEROLEMIA 02/01/2007    Qualifier: Diagnosis of  By: Floyde Parkins    . HYPERLIPIDEMIA 12/20/2007    Qualifier: Diagnosis of  By: Burnice Logan  MD, Doretha Sou   . HYPERTENSION 02/01/2007    Qualifier: Diagnosis of  By: Floyde Parkins    . LOW BACK PAIN 01/13/2010    Qualifier: Diagnosis of  By: Burnice Logan  MD, Doretha Sou MUSCLE CRAMPS 04/06/2010    Qualifier: Diagnosis of  By: Burnice Logan  MD, Doretha Sou   . MYALGIA 01/20/2010    Qualifier: Diagnosis of  By: Elease Hashimoto MD, [place]    . OSTEOARTHRITIS 01/13/2010    Qualifier: Diagnosis of  By: Burnice Logan  MD, Doretha Sou   . OVERACTIVE BLADDER 08/20/2009    Qualifier: Diagnosis of  By: Burnice Logan  MD, Doretha Sou   . PLEURISY 06/18/2008    Qualifier: Diagnosis of  By: Niel Hummer MD, [place] PNEUMONIA 06/18/2008    Qualifier: Diagnosis of  By: Niel Hummer MD, [place] Rectal prolapse 09/20/2007    Qualifier: Diagnosis of  By: Burnice Logan  MD, Doretha Sou   . VITAMIN B12 DEFICIENCY  07/20/2010    Qualifier: Diagnosis of  By: Carlean Purl MD, Dimas Millin Asthma    Past Surgical History  Procedure Laterality Date  . Cholecystectomy    . Bladder surgery    . Cataract extraction Bilateral 2012  . Abdominal hysterectomy      partial- states she has her ovaries  . Appendectomy     Family History  Problem Relation Age of Onset  . Deep vein thrombosis Mother   . COPD Father   . Asthma Father   . Diabetes Neg Hx     family hx  . Cancer Neg Hx     breast ca - sister(S)?  Marland Kitchen Heart disease Neg Hx     family hx   Social History  Substance Use Topics  . Smoking status: Former Smoker    Quit date: 07/04/1938  . Smokeless tobacco: Never Used  . Alcohol Use: No   OB History    No data available     Review of Systems ROS +'ve left knee pain  Denies: HEADACHE, NAUSEA, ABDOMINAL PAIN, CHEST PAIN, CONGESTION, DYSURIA, SHORTNESS OF BREATH  Allergies  Metformin and Sitagliptin phosphate  Home Medications   Prior to Admission medications   Medication Sig Start Date End Date Taking? Authorizing Provider  aspirin 81 MG tablet Take 81 mg by  mouth daily.     Yes Historical Provider, MD  calcium-vitamin D (OSCAL WITH D 500-200) 500-200 MG-UNIT per tablet Take 1 tablet by mouth daily.     Yes Historical Provider, MD  cyanocobalamin 1000 MCG tablet Take 100 mcg by mouth daily.     Yes Historical Provider, MD  glimepiride (AMARYL) 4 MG tablet TAKE ONE TABLET BY MOUTH TWICE DAILY 07/13/15  Yes Philemon Kingdom, MD  hydrochlorothiazide (MICROZIDE) 12.5 MG capsule Take 1 capsule (12.5 mg total) by mouth every morning. 01/31/12  Yes Marletta Lor, MD  insulin aspart (NOVOLOG FLEXPEN) 100 UNIT/ML FlexPen Inject 15-18 units into the skin before each of the 3 meals. Dx code: 250.02 06/23/15  Yes Philemon Kingdom, MD  insulin aspart (NOVOLOG FLEXPEN) 100 UNIT/ML FlexPen Inject 15-18 Units into the skin 3 (three) times daily with meals. 07/27/15  Yes Philemon Kingdom, MD  Insulin  Glargine (LANTUS SOLOSTAR) 100 UNIT/ML Solostar Pen Inject 40 Units into the skin daily at 10 pm. 06/23/15  Yes Philemon Kingdom, MD  losartan (COZAAR) 100 MG tablet TAKE ONE TABLET BY MOUTH ONCE DAILY 05/11/15  Yes Marletta Lor, MD  simvastatin (ZOCOR) 40 MG tablet TAKE ONE-HALF TABLET BY MOUTH AT BEDTIME. MUST HAVE PHYSICAL BEFORE FURTHER REFILLS. 08/10/15  Yes Marletta Lor, MD  Acetaminophen 650 MG TABS Take 1 tablet (650 mg total) by mouth 3 (three) times daily as needed. 12/19/12   Randa Spike, MD  ADVOCATE REDI-CODE test strip  05/09/14   Historical Provider, MD  albuterol (PROVENTIL,VENTOLIN) 90 MCG/ACT inhaler Inhale 2 puffs into the lungs every 6 (six) hours as needed. 03/16/11   Marletta Lor, MD  Albuterol Sulfate (PROAIR RESPICLICK) 123XX123 (90 BASE) MCG/ACT AEPB Inhale 2 application into the lungs 4 (four) times daily as needed. 06/08/15   Marletta Lor, MD  benzonatate (TESSALON) 200 MG capsule Take 1 capsule (200 mg total) by mouth 3 (three) times daily as needed for cough. 03/30/15   Marletta Lor, MD  Blood Glucose Calibration (ACCU-CHEK AVIVA) SOLN  05/09/15   Historical Provider, MD  Blood Glucose Calibration (ADVOCATE REDI-CODE+ CONTROL) LOW SOLN  03/02/14   Historical Provider, MD  fluticasone (FLONASE) 50 MCG/ACT nasal spray Place 1 spray into the nose daily. 12/19/12   Adlih Moreno-Coll, MD  glucose blood (ACCU-CHEK AVIVA) test strip Use as instructed 04/06/15   Philemon Kingdom, MD  HYDROcodone-acetaminophen (NORCO/VICODIN) 5-325 MG per tablet TAKE ONE TABLET BY MOUTH EVERY 6 HOURS AS NEEDED FOR PAIN 04/25/14   Marletta Lor, MD  ibuprofen (ADVIL,MOTRIN) 600 MG tablet TAKE ONE TABLET BY MOUTH EVERY 6 HOURS AS NEEDED FOR PAIN 03/23/15   Marletta Lor, MD  Insulin Pen Needle (BD PEN NEEDLE NANO U/F) 32G X 4 MM MISC USE TO INJECT INSULIN 4 TIMES DAILY AS DIRECTED. 03/24/15   Philemon Kingdom, MD  losartan (COZAAR) 100 MG tablet TAKE ONE TABLET BY  MOUTH ONCE DAILY 01/20/14   Marletta Lor, MD  Multiple Vitamin (MULTIVITAMIN) tablet Take 1 tablet by mouth daily.      Historical Provider, MD  PHARMACIST CHOICE LANCETS [place]  03/02/14   Historical Provider, MD  SURE COMFORT PEN NEEDLES 31G X 8 MM [place]  05/09/15   Historical Provider, MD  traMADol (ULTRAM) 50 MG tablet Take 1 tablet (50 mg total) by mouth every 6 (six) hours as needed. Patient not taking: Reported on 06/23/2015 06/08/15   Marletta Lor, MD  traZODone (DESYREL) 150 MG tablet Take 1 tablet (  150 mg total) by mouth at bedtime as needed. 02/26/15   Marletta Lor, MD  venlafaxine XR (EFFEXOR-XR) 75 MG 24 hr capsule TAKE ONE CAPSULE BY MOUTH ONCE DAILY 06/03/15   Marletta Lor, MD   Meds Ordered and Administered this Visit  Medications - No data to display  BP 109/60 mmHg  Pulse 88  Temp(Src) 98.6 F (37 C) (Oral)  Resp 16  SpO2 95% No data found.   Physical Exam  Constitutional: She is oriented to person, place, and time. She appears well-developed and well-nourished. No distress.  HENT:  Head: Normocephalic and atraumatic.  Eyes: Conjunctivae are normal.  Pulmonary/Chest: Effort normal.  Musculoskeletal: Normal range of motion. She exhibits no tenderness.       Left knee: She exhibits normal range of motion, no swelling, no effusion, no deformity, no erythema and normal alignment.  Neurological: She is alert and oriented to person, place, and time.  Skin: Skin is warm and dry.  Psychiatric: She has a normal mood and affect. Her behavior is normal.    ED Course  Procedures (including critical care time)  Labs Review Labs Reviewed - No data to display  Imaging Review Dg Knee Complete 4 Views Left  08/11/2015  CLINICAL DATA:  Per pt: fell over a month ago and injured the left knee. Patient wasn't specific, stated the entire left knee hurts. Patient can extend and flex the left knee, the most pain is when patient tries to stand after sitting.  Patient is a diabetic EXAM: LEFT KNEE - COMPLETE 4+ VIEW COMPARISON:  None. FINDINGS: No fracture. No dislocation. Minimal marginal osteophytes from the medial compartment and patella. No other arthropathic change. No joint effusion. Soft tissues are unremarkable. IMPRESSION: No fracture or acute finding. Electronically Signed   By: Lajean Manes M.D.   On: 08/11/2015 16:57     Visual Acuity Review  Right Eye Distance:   Left Eye Distance:   Bilateral Distance:    Right Eye Near:   Left Eye Near:    Bilateral Near:       Review of x-rays with patient.  Pt states her doctor would like her not to take NSAIDS as she has some decrease in renal function.  Patient was placed in a knee sleeve was also advised to use a walker or cane to assist with ambulation MDM   1. Knee pain, chronic, left   Patient is advised to continue home symptomatic treatment. Prescription for tramadol  sent pharmacy patient has indicated. Patient is advised that if there are new or worsening symptoms or attend the emergency department, or contact primary care provider. Instructions of care provided discharged home in stable condition. Return to work/school note provided.  THIS NOTE WAS GENERATED USING A VOICE RECOGNITION SOFTWARE PROGRAM. ALL REASONABLE EFFORTS  WERE MADE TO PROOFREAD THIS DOCUMENT FOR ACCURACY.       Konrad Felix, [place] 08/11/15 1745

## 2015-08-11 NOTE — Discharge Instructions (Signed)

## 2015-08-11 NOTE — ED Notes (Signed)
C/o left knee pain onset x1 month... Reports she fell about a month ago at church and landed on left knee Pain increases w/activity A&O x4... No acute distress.

## 2015-08-23 ENCOUNTER — Other Ambulatory Visit: Payer: Self-pay | Admitting: Internal Medicine

## 2015-08-29 ENCOUNTER — Other Ambulatory Visit: Payer: Self-pay | Admitting: Internal Medicine

## 2015-08-31 ENCOUNTER — Other Ambulatory Visit: Payer: Self-pay | Admitting: *Deleted

## 2015-08-31 LAB — HM DIABETES EYE EXAM

## 2015-08-31 MED ORDER — INSULIN GLARGINE 100 UNIT/ML SOLOSTAR PEN: PEN_INJECTOR | SUBCUTANEOUS | Status: DC

## 2015-09-03 ENCOUNTER — Encounter: Payer: Self-pay | Admitting: Internal Medicine

## 2015-09-22 ENCOUNTER — Ambulatory Visit (INDEPENDENT_AMBULATORY_CARE_PROVIDER_SITE_OTHER): Payer: Medicare Other | Admitting: Internal Medicine

## 2015-09-22 ENCOUNTER — Other Ambulatory Visit (INDEPENDENT_AMBULATORY_CARE_PROVIDER_SITE_OTHER): Payer: Medicare Other | Admitting: *Deleted

## 2015-09-22 ENCOUNTER — Encounter: Payer: Self-pay | Admitting: Internal Medicine

## 2015-09-22 VITALS — BP 128/80 | HR 92 | Temp 98.0°F | Resp 12 | Wt 182.6 lb

## 2015-09-22 DIAGNOSIS — E1121 Type 2 diabetes mellitus with diabetic nephropathy: Secondary | ICD-10-CM

## 2015-09-22 DIAGNOSIS — Z794 Long term (current) use of insulin: Secondary | ICD-10-CM | POA: Diagnosis not present

## 2015-09-22 LAB — POCT GLYCOSYLATED HEMOGLOBIN (HGB A1C): Hemoglobin A1C: 8.4

## 2015-09-22 MED ORDER — INSULIN ASPART 100 UNIT/ML FLEXPEN: 16.0000 [IU] | PEN_INJECTOR | Freq: Three times a day (TID) | SUBCUTANEOUS | Status: DC

## 2015-09-22 NOTE — Progress Notes (Signed)
Patient ID: [name] [name], female   DOB: 1928-10-06, 82 y.o.   MRN: [id number]  HPI: [name] [name] is a 82 y.o.-year-old female, returning for f/u for DM2, dx 2005, insulin-dependent, uncontrolled, without complications. Last visit 3 mo ago. Switched to [place].  Last hemoglobin A1c was: Lab Results  Component Value Date   HGBA1C 9.1* 06/08/2015   HGBA1C 8.7* 02/26/2015   HGBA1C 7.8* 08/07/2014  previously 9.6%, previously 8.8% 2 years ago.  Pt is on a regimen of:  - Amaryl 4 mg bid  - Lantus 40 units at bedtime - NovoLog   15 units with a regular meal  18 units for a larger meal We had to stop Metformin 2/2 increased creatinine.  Pt checks her sugars 2-3x a day. Reviewed log: - am: 70-155 >> 111-179 >> 95-160 >> 120-140 >> 110-155 >> 120-170, 190 >> 130-180 >> 112-176 >> 126-149, 162 - 2h after b'fast: 140-192 >> 116, 134-168, 175 - lunch: 170 >> 168 >> n/c >> 178-214, 248 >> 167-189, 242 - 2h after lunch: 299 >> 161, 181 - before dinner: 127-168 >> 178 >> n/c >> 180, 192 - later: 191-259 >> 173-260 >> 200-240 >> 135, 171-259, 272 >> 125, 160-280 >> 200s >> 164-302, 380 >>159,  179-302 No lows. Lowest sugar was 120>> 112. She has hypoglycemia awareness around 70. Highest 380.  Pt has chronic kidney disease stage 3-4, last BUN/creatinine was:  Lab Results  Component Value Date   BUN 24* 02/26/2015   CREATININE 1.51* 02/26/2015  Not taking Losartan. Last set of lipids: Lab Results  Component Value Date   CHOL 200 08/07/2014   HDL 45.00 08/07/2014   LDLDIRECT 107.0 08/07/2014   TRIG 383.0* 08/07/2014   CHOLHDL 4 08/07/2014  On Zocor 1/2 tab (20 mg) daily. Pt's last eye exam was in 08/31/2015, with Dr. Susa Loffler at [facility] ophthalmology. No DR.  Denies numbness and tingling in her legs.  ROS: Constitutional: no weight gain/loss, no fatigue, no subjective hyperthermia/hypothermia Eyes: no blurry vision, no xerophthalmia ENT: no sore throat, no  nodules palpated in throat, no dysphagia/odynophagia, no hoarseness Cardiovascular: no CP/+ SOB/palpitations/leg swelling Respiratory: + cough/+ SOB/no wheezing Gastrointestinal: no N/V/D/C Musculoskeletal: no muscle aches/no joint aches Skin: no rashes Neurological: no tremors/numbness/tingling/dizziness  I reviewed pt's medications, allergies, PMH, social hx, family hx, and changes were documented in the history of present illness. Otherwise, unchanged from my initial visit note.  PE: BP 128/80 mmHg  Pulse 92  Temp(Src) 98 F (36.7 C) (Oral)  Resp 12  Wt 182 lb 9.6 oz (82.827 kg)  SpO2 95% Body mass index is 31.83 kg/(m^2). Wt Readings from Last 3 Encounters:  09/22/15 182 lb 9.6 oz (82.827 kg)  06/23/15 181 lb 12.8 oz (82.464 kg)  06/08/15 183 lb (83.008 kg)   Constitutional: overweight, in NAD Eyes: PERRLA, EOMI, no exophthalmos ENT: moist mucous membranes, no thyromegaly, no cervical lymphadenopathy Cardiovascular: RRR, SEM 2/6, no RG Respiratory: CTA B Gastrointestinal: abdomen soft, NT, ND, BS+ Musculoskeletal: no deformities, strength intact in all 4 Skin: moist, warm, no rashes  ASSESSMENT: 1. DM2, insulin-dependent, uncontrolled, without complications - She does have kidney dysfunction, but no diabetic retinopathy, so I doubt that her kidney dysfunction is due to diabetes  2. HL  PLAN:  1. Patient with long-standing diabetes, with no apparent complications. Sugars mostly increased after dinner >> will increase Novolog. - I advised her to:   Patient Instructions  Please continue: - Amaryl 4 mg 2x a day -  Lantus 40 units at bedtime Increase: - NovoLog   16 units with a regular meal  20 units for a larger meal  Please return in 3 months.  - she got her flu shot this season - UTD with eye exams - checked HbA1c: 8.4% (improved) - I will see her back in 3 months with her sugar log

## 2015-09-22 NOTE — Patient Instructions (Signed)
Please continue: - Amaryl 4 mg 2x a day - Lantus 40 units at bedtime Increase: - NovoLog   16 units with a regular meal  20 units for a larger meal  Please return in 3 months.

## 2015-10-03 ENCOUNTER — Other Ambulatory Visit: Payer: Self-pay | Admitting: Internal Medicine

## 2015-12-04 ENCOUNTER — Other Ambulatory Visit: Payer: Self-pay | Admitting: Internal Medicine

## 2015-12-23 ENCOUNTER — Other Ambulatory Visit: Payer: Self-pay | Admitting: Internal Medicine

## 2015-12-28 ENCOUNTER — Ambulatory Visit (INDEPENDENT_AMBULATORY_CARE_PROVIDER_SITE_OTHER): Payer: Medicare Other | Admitting: Internal Medicine

## 2015-12-28 ENCOUNTER — Encounter: Payer: Self-pay | Admitting: Internal Medicine

## 2015-12-28 VITALS — BP 130/74 | HR 88 | Ht 65.0 in | Wt 184.0 lb

## 2015-12-28 DIAGNOSIS — Z794 Long term (current) use of insulin: Secondary | ICD-10-CM | POA: Diagnosis not present

## 2015-12-28 DIAGNOSIS — E1121 Type 2 diabetes mellitus with diabetic nephropathy: Secondary | ICD-10-CM

## 2015-12-28 DIAGNOSIS — R05 Cough: Secondary | ICD-10-CM | POA: Diagnosis not present

## 2015-12-28 DIAGNOSIS — R059 Cough, unspecified: Secondary | ICD-10-CM

## 2015-12-28 LAB — POCT GLYCOSYLATED HEMOGLOBIN (HGB A1C): HEMOGLOBIN A1C: 7.8

## 2015-12-28 MED ORDER — BENZONATATE 200 MG PO CAPS: 200.0000 mg | ORAL_CAPSULE | Freq: Three times a day (TID) | ORAL | Status: DC | PRN

## 2015-12-28 NOTE — Patient Instructions (Addendum)
Please continue:  - Lantus 40 units at bedtime - NovoLog   16 units with a regular meal  20 units for a larger meal  Please stop Amaryl.  Please let me know if the sugars are consistently <70 or >200.  Please stop at the lab.  Please return in 3 months with your sugar log.

## 2015-12-28 NOTE — Progress Notes (Signed)
Patient ID: ANUOLUWAPO VETSCH, female   DOB: Apr 15, 1929, 82 y.o.   MRN: [id number]  HPI: [name] [name] is a 82 y.o.-year-old female, returning for f/u for DM2, dx 2005, insulin-dependent, uncontrolled, without complications. Last visit 3 mo ago. Switched to [place].  Last hemoglobin A1c was: Lab Results  Component Value Date   HGBA1C 8.4 09/22/2015   HGBA1C 9.1* 06/08/2015   HGBA1C 8.7* 02/26/2015  previously 9.6%, previously 8.8% 2 years ago.  Pt is on a regimen of:  - Amaryl 4 mg 2x a day - Lantus 40 units at bedtime - NovoLog 2-3  16 units with a regular meal  20 units for a larger meal We had to stop Metformin 2/2 increased creatinine.  Pt checks her sugars 2-3x a day. Reviewed log: - am: 111-179 >> 95-160 >> 120-140 >> 110-155 >> 120-170, 190 >> 130-180 >> 112-176 >> 126-149, 162 >> 59, 63-145, 159 - 2h after b'fast: 140-192 >> 116, 134-168, 175 >> 98-141,182 - lunch: 170 >> 168 >> n/c >> 178-214, 248 >> 167-189, 242 >> 75 - 2h after lunch: 299 >> 161, 181 - before dinner: 127-168 >> 178 >> n/c >> 180, 192 - later: 73-260 >> 200-240 >> 135, 171-259, 272 >> 125, 160-280 >> 200s >> 164-302, 380 >>159,  179-302 >> 126- 299 No lows. Lowest sugar was 120>> 112 >> 59. She has hypoglycemia awareness around 70. Highest 380 >> 299.  Pt has chronic kidney disease stage 3-4, last BUN/creatinine was:  Lab Results  Component Value Date   BUN 24* 02/26/2015   CREATININE 1.51* 02/26/2015  Not taking Losartan. Last set of lipids: Lab Results  Component Value Date   CHOL 200 08/07/2014   HDL 45.00 08/07/2014   LDLDIRECT 107.0 08/07/2014   TRIG 383.0* 08/07/2014   CHOLHDL 4 08/07/2014  On Zocor 1/2 tab (20 mg) daily. Pt's last eye exam was in 08/31/2015, with Dr. Susa Loffler at [facility] ophthalmology. No DR.  Denies numbness and tingling in her legs.  ROS: Constitutional: no weight gain/loss, no fatigue, no subjective hyperthermia/hypothermia Eyes: no blurry  vision, no xerophthalmia ENT: no sore throat, no nodules palpated in throat, no dysphagia/odynophagia, no hoarseness Cardiovascular: no CP/+ SOB/palpitations/leg swelling Respiratory: + cough/+ SOB/no wheezing Gastrointestinal: no N/V/D/C Musculoskeletal: no muscle aches/no joint aches Skin: no rashes Neurological: no tremors/numbness/tingling/dizziness  I reviewed pt's medications, allergies, PMH, social hx, family hx, and changes were documented in the history of present illness. Otherwise, unchanged from my initial visit note.  PE: BP 130/74 mmHg  Pulse 88  Ht 5\' 5"  (1.651 m)  Wt 184 lb (83.462 kg)  BMI 30.62 kg/m2  SpO2 93% Body mass index is 30.62 kg/(m^2). Wt Readings from Last 3 Encounters:  12/28/15 184 lb (83.462 kg)  09/22/15 182 lb 9.6 oz (82.827 kg)  06/23/15 181 lb 12.8 oz (82.464 kg)   Constitutional: overweight, in NAD Eyes: PERRLA, EOMI, no exophthalmos ENT: moist mucous membranes, no thyromegaly, no cervical lymphadenopathy Cardiovascular: RRR, SEM 2/6, no RG Respiratory: CTA B Gastrointestinal: abdomen soft, NT, ND, BS+ Musculoskeletal: no deformities, strength intact in all 4 Skin: moist, warm, no rashes  ASSESSMENT: 1. DM2, insulin-dependent, uncontrolled, without complications - She does have kidney dysfunction, but no diabetic retinopathy, so I doubt that her kidney dysfunction is due to diabetes  2. Cough   PLAN:  1. Patient with long-standing diabetes, with no apparent complications. Sugars mostly increased after dinner >> we increased Novolog with this meal at last visit. Sugars a  little lower after dinner now, but still high. She eats her largest meal at night.  Sugars in am are lower >> will stop Amaryl and continue same insulin doses for now, but if she still has lows after this vhange, may need to decrease Lantus or Novolog with dinner. - I advised her to:   Patient Instructions  Please continue:  - Lantus 40 units at bedtime - NovoLog   16  units with a regular meal  20 units for a larger meal  Please stop Amaryl.  Please let me know if the sugars are consistently <70 or >200.  Please stop at the lab.  Please return in 3 months with your sugar log.  - she got her flu shot this season - UTD with eye exams - checked HbA1c: 7.8% (improved) - I will see her back in 3 months with her sugar log  2. Cough - lungs CTA  - sent a Rx of tessalon perles to her pharm. These worked for her in the past.

## 2016-01-02 ENCOUNTER — Telehealth: Payer: Self-pay

## 2016-01-02 NOTE — Telephone Encounter (Signed)
Patient is on the list for [facility] 2017 and may be a good candidate for an AWV in 2017. Please let me know if/when appt is scheduled.   

## 2016-01-05 ENCOUNTER — Other Ambulatory Visit: Payer: Self-pay | Admitting: Internal Medicine

## 2016-01-18 ENCOUNTER — Other Ambulatory Visit: Payer: Self-pay | Admitting: Internal Medicine

## 2016-01-26 ENCOUNTER — Other Ambulatory Visit: Payer: Self-pay | Admitting: Internal Medicine

## 2016-02-01 ENCOUNTER — Other Ambulatory Visit: Payer: Self-pay | Admitting: Internal Medicine

## 2016-02-16 ENCOUNTER — Other Ambulatory Visit: Payer: Self-pay | Admitting: Internal Medicine

## 2016-03-29 ENCOUNTER — Encounter: Payer: Self-pay | Admitting: Internal Medicine

## 2016-03-29 ENCOUNTER — Ambulatory Visit (INDEPENDENT_AMBULATORY_CARE_PROVIDER_SITE_OTHER): Payer: Medicare Other | Admitting: Internal Medicine

## 2016-03-29 VITALS — BP 138/73 | HR 83 | Ht 65.0 in | Wt 176.0 lb

## 2016-03-29 DIAGNOSIS — J069 Acute upper respiratory infection, unspecified: Secondary | ICD-10-CM

## 2016-03-29 DIAGNOSIS — E7800 Pure hypercholesterolemia, unspecified: Secondary | ICD-10-CM | POA: Diagnosis not present

## 2016-03-29 DIAGNOSIS — Z794 Long term (current) use of insulin: Secondary | ICD-10-CM | POA: Diagnosis not present

## 2016-03-29 DIAGNOSIS — E1121 Type 2 diabetes mellitus with diabetic nephropathy: Secondary | ICD-10-CM | POA: Diagnosis not present

## 2016-03-29 LAB — POCT GLYCOSYLATED HEMOGLOBIN (HGB A1C): HEMOGLOBIN A1C: 9.4

## 2016-03-29 LAB — LIPID PANEL
CHOL/HDL RATIO: 3
Cholesterol: 167 mg/dL (ref 0–200)
HDL: 49.7 mg/dL (ref >39.00)
NONHDL: 117.68
Triglycerides: 224 mg/dL — ABNORMAL HIGH (ref 0.0–149.0)
VLDL: 44.8 mg/dL — ABNORMAL HIGH (ref 0.0–40.0)

## 2016-03-29 LAB — COMPLETE METABOLIC PANEL WITH GFR
ALBUMIN: 4 g/dL (ref 3.6–5.1)
ALK PHOS: 45 U/L (ref 33–130)
ALT: 24 U/L (ref 6–29)
AST: 38 U/L — ABNORMAL HIGH (ref 10–35)
BUN: 26 mg/dL — AB (ref 7–25)
CALCIUM: 10 mg/dL (ref 8.6–10.4)
CO2: 24 mmol/L (ref 20–31)
CREATININE: 1.46 mg/dL — AB (ref 0.60–0.88)
Chloride: 101 mmol/L (ref 98–110)
GFR, EST AFRICAN AMERICAN: 38 mL/min — AB (ref >=60)
GFR, EST NON AFRICAN AMERICAN: 33 mL/min — AB (ref >=60)
GLUCOSE: 132 mg/dL — AB (ref 65–99)
POTASSIUM: 4.7 mmol/L (ref 3.5–5.3)
Sodium: 136 mmol/L (ref 135–146)
Total Bilirubin: 0.3 mg/dL (ref 0.2–1.2)
Total Protein: 7.5 g/dL (ref 6.1–8.1)

## 2016-03-29 LAB — LDL CHOLESTEROL, DIRECT: LDL DIRECT: 99 mg/dL

## 2016-03-29 MED ORDER — GLIMEPIRIDE 4 MG PO TABS: 4.0000 mg | ORAL_TABLET | Freq: Two times a day (BID) | ORAL | 3 refills | Status: DC

## 2016-03-29 NOTE — Progress Notes (Signed)
Patient ID: [name] [name], female   DOB: [birth year], 82 y.o.   MRN: [id number]  HPI: [name] [name] is a 82 y.o.-year-old female, returning for f/u for DM2, dx 2005, insulin-dependent, uncontrolled, with complications (nephropathy). Last visit 3 mo ago. Ins.: [employer].  Last hemoglobin A1c was: Lab Results  Component Value Date   HGBA1C 7.8 12/28/2015   HGBA1C 8.4 09/22/2015   HGBA1C 9.1 (H) 06/08/2015  previously 9.6%, previously 8.8% 2 years ago.  Pt is on a regimen of: - Lantus 40 units at bedtime - NovoLog 2-3x a day:  18 units with a regular meal  20 units for a larger meal We had to stop Metformin 2/2 increased creatinine. We stopped Amaryl at last visit.  Pt checks her sugars 2-3x a day. No log: - am: 120-170, 190 >> 130-180 >> 112-176 >> 126-149, 162 >> 59, 63-145, 159 >> low 200s - 2h after b'fast: 140-192 >> 116, 134-168, 175 >> 98-141,182 >> n/c - lunch: 170 >> 168 >> n/c >> 178-214, 248 >> 167-189, 242 >> 75 >> n/c - 2h after lunch: 299 >> 161, 181 - before dinner: 127-168 >> 178 >> n/c >> 180, 192 >> n/c - later: 125, 160-280 >> 200s >> 164-302, 380 >>159,  179-302 >> 126- 299 >> high 200s No lows. Lowest sugar was 120 >> 112 >> 59 >> 160. She has hypoglycemia awareness around 70.  Highest 380 >> 299 >> 300s.  Pt has chronic kidney disease stage 3-4, last BUN/creatinine was:  Lab Results  Component Value Date   BUN 24 (H) 02/26/2015   CREATININE 1.51 (H) 02/26/2015  Not taking Losartan. Last set of lipids: Lab Results  Component Value Date   CHOL 200 08/07/2014   HDL 45.00 08/07/2014   LDLDIRECT 107.0 08/07/2014   TRIG 383.0 (H) 08/07/2014   CHOLHDL 4 08/07/2014  On Zocor 1/2 tab (20 mg) daily. Pt's last eye exam was in 08/31/2015, with Dr. Susa Loffler at [facility] ophthalmology. No DR.  Denies numbness and tingling in her legs.  ROS: Constitutional: no weight gain/loss, no fatigue, no subjective hyperthermia/hypothermia Eyes: no  blurry vision, no xerophthalmia ENT: no sore throat, no nodules palpated in throat, no dysphagia/odynophagia, no hoarseness Cardiovascular: no CP/+ SOB/palpitations/leg swelling Respiratory: + cough/+ SOB/no wheezing Gastrointestinal: no N/V/D/C Musculoskeletal: no muscle aches/no joint aches Skin: no rashes Neurological: no tremors/numbness/tingling/dizziness  I reviewed pt's medications, allergies, PMH, social hx, family hx, and changes were documented in the history of present illness. Otherwise, unchanged from my initial visit note.  PE: BP 138/73   Pulse 83   Ht 5\' 5"  (1.651 m)   Wt 176 lb (79.8 kg)   BMI 29.29 kg/m  Body mass index is 29.29 kg/m. Wt Readings from Last 3 Encounters:  03/29/16 176 lb (79.8 kg)  12/28/15 184 lb (83.5 kg)  09/22/15 182 lb 9.6 oz (82.8 kg)   Constitutional: overweight, in NAD Eyes: PERRLA, EOMI, no exophthalmos ENT: moist mucous membranes, no thyromegaly, no cervical lymphadenopathy Cardiovascular: RRR, SEM 2/6, no RG Respiratory: Crackles in R lung base Gastrointestinal: abdomen soft, NT, ND, BS+ Musculoskeletal: no deformities, strength intact in all 4 Skin: moist, warm, no rashes  ASSESSMENT: 1. DM2, insulin-dependent, uncontrolled, without complications - She does have kidney dysfunction  2. HL  3. URI  PLAN:  1. Patient with long-standing diabetes, with worse control at this visit. She is on a good amount of Novolog >> we stopped Amaryl at last visit, but sugars higher >> will  need to restart Amaryl and will continue same insulin doses for now. - I advised her to:   Patient Instructions  Please continue:  - Lantus 40 units at bedtime - NovoLog   18 units with a regular meal  20 units for a larger meal  Please restart Amaryl 4 mg 2x a day, before meals.  Please let me know if the sugars are consistently <70 or >200.  Please return in 1.5 months with your sugar log.  - she got her flu shot this season - [facility] - UTD  with eye exams - checked HbA1c: 9.6% (worse) - I will see her back in 1.5 months with her sugar log  2. HL - check Lipids today  3. URI - cough, no fever - crackles R lung - advised to see PCP tomorrow  - sister in hospital with PNA  Office Visit on 03/29/2016  Component Date Value Ref Range Status  . Sodium 03/29/2016 136  135 - 146 mmol/L Final  . Potassium 03/29/2016 4.7  3.5 - 5.3 mmol/L Final  . Chloride 03/29/2016 101  98 - 110 mmol/L Final  . CO2 03/29/2016 24  20 - 31 mmol/L Final  . Glucose, Bld 03/29/2016 132* 65 - 99 mg/dL Final  . BUN 03/29/2016 26* 7 - 25 mg/dL Final  . Creat 03/29/2016 1.46* 0.60 - 0.88 mg/dL Final   Comment:   For patients > or = 80 years of age: The upper reference limit for Creatinine is approximately 13% higher for people identified as African-American.     . Total Bilirubin 03/29/2016 0.3  0.2 - 1.2 mg/dL Final  . Alkaline Phosphatase 03/29/2016 45  33 - 130 U/L Final  . AST 03/29/2016 38* 10 - 35 U/L Final  . ALT 03/29/2016 24  6 - 29 U/L Final  . Total Protein 03/29/2016 7.5  6.1 - 8.1 g/dL Final  . Albumin 03/29/2016 4.0  3.6 - 5.1 g/dL Final  . Calcium 03/29/2016 10.0  8.6 - 10.4 mg/dL Final  . GFR, Est African American 03/29/2016 38* >=60 mL/min Final  . GFR, Est Non African American 03/29/2016 33* >=60 mL/min Final  . Cholesterol 03/29/2016 167  0 - 200 mg/dL Final  . Triglycerides 03/29/2016 224.0* 0.0 - 149.0 mg/dL Final  . HDL 03/29/2016 49.70  >39.00 mg/dL Final  . VLDL 03/29/2016 44.8* 0.0 - 40.0 mg/dL Final  . Total CHOL/HDL Ratio 03/29/2016 3   Final  . NonHDL 03/29/2016 117.68   Final  . Hemoglobin A1C 03/29/2016 9.4   Final  . Direct LDL 03/29/2016 99.0  mg/dL Final   Labs are better.  Philemon Kingdom, MD PhD [facility] Endocrinology

## 2016-03-29 NOTE — Patient Instructions (Addendum)
Please continue:  - Lantus 40 units at bedtime - NovoLog   18 units with a regular meal  20 units for a larger meal  Please restart Amaryl 4 mg 2x a day, before meals.  Please let me know if the sugars are consistently <70 or >200.  Please call Dr Raliegh Ip today and schedule an appt tomorrow.  Please return in 1.5 months with your sugar log.

## 2016-03-30 ENCOUNTER — Telehealth: Payer: Self-pay

## 2016-03-30 ENCOUNTER — Ambulatory Visit (INDEPENDENT_AMBULATORY_CARE_PROVIDER_SITE_OTHER)
Admission: RE | Admit: 2016-03-30 | Discharge: 2016-03-30 | Disposition: A | Discharge status: Home / Self Care | Payer: Medicare Other | Source: Ambulatory Visit | Attending: Adult Health | Admitting: Adult Health

## 2016-03-30 ENCOUNTER — Ambulatory Visit (INDEPENDENT_AMBULATORY_CARE_PROVIDER_SITE_OTHER): Payer: Medicare Other | Admitting: Adult Health

## 2016-03-30 ENCOUNTER — Encounter: Payer: Self-pay | Admitting: Adult Health

## 2016-03-30 VITALS — BP 124/64 | Temp 97.9°F | Ht 65.0 in | Wt 175.0 lb

## 2016-03-30 DIAGNOSIS — J069 Acute upper respiratory infection, unspecified: Secondary | ICD-10-CM

## 2016-03-30 DIAGNOSIS — R05 Cough: Secondary | ICD-10-CM | POA: Diagnosis not present

## 2016-03-30 DIAGNOSIS — R059 Cough, unspecified: Secondary | ICD-10-CM

## 2016-03-30 MED ORDER — PREDNISONE 20 MG PO TABS: 20.0000 mg | ORAL_TABLET | Freq: Every day | ORAL | 0 refills | Status: DC

## 2016-03-30 MED ORDER — DOXYCYCLINE HYCLATE 100 MG PO CAPS: 100.0000 mg | ORAL_CAPSULE | Freq: Two times a day (BID) | ORAL | 0 refills | Status: DC

## 2016-03-30 NOTE — Telephone Encounter (Signed)
Called and left message, advising of lab results had improved. Gave call back number if any questions.

## 2016-03-30 NOTE — Progress Notes (Signed)
Subjective:    Patient ID: [name] [name], female    DOB: 13-Feb-1929, 82 y.o.   MRN: [id number]  HPI  82 year old female who presents to the office today for URI type. She was seen by her endocrinologist yesterday and was asked to make an appointment with her PCP for crackles in the right lung. [name] [name] reports that she feels wheezy and is short of breath, especially with exertion. She has a chronic cough that is productive from time to time. She also reports that she has sinus pain and pressure as well as rhinorrhea.   She denies fevers or feeling acutely ill.   Review of Systems  Constitutional: Negative.   HENT: Positive for congestion, rhinorrhea, sinus pressure and sore throat.   Respiratory: Positive for cough, shortness of breath and wheezing.   Cardiovascular: Negative.   All other systems reviewed and are negative.  Past Medical History:  Diagnosis Date  . Asthma   . DEGENERATIVE JOINT DISEASE, GENERALIZED 02/01/2007   Qualifier: Diagnosis of  By: Burnice Logan  MD, Doretha Sou   . DIARRHEA, CHRONIC 08/20/2009   Qualifier: Diagnosis of  By: Burnice Logan  MD, Doretha Sou   . DM 02/01/2007   Qualifier: Diagnosis of  By: Floyde Parkins    . FATIGUE 10/20/2008   Qualifier: Diagnosis of  By: Burnice Logan  MD, Doretha Sou   . HYPERCHOLESTEROLEMIA 02/01/2007   Qualifier: Diagnosis of  By: Floyde Parkins    . HYPERLIPIDEMIA 12/20/2007   Qualifier: Diagnosis of  By: Burnice Logan  MD, Doretha Sou   . HYPERTENSION 02/01/2007   Qualifier: Diagnosis of  By: Floyde Parkins    . LOW BACK PAIN 01/13/2010   Qualifier: Diagnosis of  By: Burnice Logan  MD, Doretha Sou MUSCLE CRAMPS 04/06/2010   Qualifier: Diagnosis of  By: Burnice Logan  MD, Doretha Sou   . MYALGIA 01/20/2010   Qualifier: Diagnosis of  By: Elease Hashimoto MD, [place]    . OSTEOARTHRITIS 01/13/2010   Qualifier: Diagnosis of  By: Burnice Logan  MD, Doretha Sou   . OVERACTIVE BLADDER 08/20/2009   Qualifier: Diagnosis of  By: Burnice Logan  MD, Doretha Sou   . PLEURISY 06/18/2008   Qualifier: Diagnosis of  By: Niel Hummer MD, [place] PNEUMONIA 06/18/2008   Qualifier: Diagnosis of  By: Niel Hummer MD, [place] Rectal prolapse 09/20/2007   Qualifier: Diagnosis of  By: Burnice Logan  MD, Doretha Sou   . VITAMIN B12 DEFICIENCY 07/20/2010   Qualifier: Diagnosis of  By: Carlean Purl MD, Dimas Millin     Social History   Social History  . Marital status: Single    Spouse name: N/A  . Number of children: N/A  . Years of education: N/A   Occupational History  . Not on file.   Social History Main Topics  . Smoking status: Former Smoker    Quit date: 07/04/1938  . Smokeless tobacco: Never Used  . Alcohol use No  . Drug use: No  . Sexual activity: Not on file   Other Topics Concern  . Not on file   Social History Narrative  . No narrative on file    Past Surgical History:  Procedure Laterality Date  . ABDOMINAL HYSTERECTOMY     partial- states she has her ovaries  . APPENDECTOMY    . BLADDER SURGERY    . CATARACT EXTRACTION Bilateral 2012  . CHOLECYSTECTOMY      Family History  Problem Relation Age of  Onset  . Deep vein thrombosis Mother   . COPD Father   . Asthma Father   . Diabetes Neg Hx     family hx  . Cancer Neg Hx     breast ca - sister(S)?  Marland Kitchen Heart disease Neg Hx     family hx    Allergies  Allergen Reactions  . Metformin     REACTION: diarrhea  . Sitagliptin Phosphate     REACTION: abdominal pain    Current Outpatient Prescriptions on File Prior to Visit  Medication Sig Dispense Refill  . Acetaminophen 650 MG TABS Take 1 tablet (650 mg total) by mouth 3 (three) times daily as needed. 30 tablet 0  . ADVOCATE REDI-CODE test strip   99  . albuterol (PROVENTIL,VENTOLIN) 90 MCG/ACT inhaler Inhale 2 puffs into the lungs every 6 (six) hours as needed. 34 g 3  . Albuterol Sulfate (PROAIR RESPICLICK) 564 (90 BASE) MCG/ACT AEPB Inhale 2 application into the lungs 4 (four) times daily as needed. 1 each 3  . aspirin 81 MG tablet Take 81 mg by  mouth daily.      . benzonatate (TESSALON) 200 MG capsule Take 1 capsule (200 mg total) by mouth 3 (three) times daily as needed for cough. 20 capsule 1  . Blood Glucose Calibration (ACCU-CHEK AVIVA) SOLN     . Blood Glucose Calibration (ADVOCATE REDI-CODE+ CONTROL) LOW SOLN     . calcium-vitamin D (OSCAL WITH D 500-200) 500-200 MG-UNIT per tablet Take 1 tablet by mouth daily.      . cyanocobalamin 1000 MCG tablet Take 100 mcg by mouth daily.      . fluticasone (FLONASE) 50 MCG/ACT nasal spray Place 1 spray into the nose daily. 16 g 0  . glimepiride (AMARYL) 4 MG tablet Take 1 tablet (4 mg total) by mouth 2 (two) times daily before a meal. 180 tablet 3  . glucose blood (ACCU-CHEK AVIVA) test strip Use as instructed 100 each 1  . hydrochlorothiazide (MICROZIDE) 12.5 MG capsule Take 1 capsule (12.5 mg total) by mouth every morning. 90 capsule 3  . HYDROcodone-acetaminophen (NORCO/VICODIN) 5-325 MG per tablet TAKE ONE TABLET BY MOUTH EVERY 6 HOURS AS NEEDED FOR PAIN 90 tablet 0  . ibuprofen (ADVIL,MOTRIN) 600 MG tablet TAKE ONE TABLET BY MOUTH EVERY 6 HOURS AS NEEDED FOR PAIN 60 tablet 2  . insulin aspart (NOVOLOG FLEXPEN) 100 UNIT/ML FlexPen Inject 16-20 Units into the skin 3 (three) times daily with meals. 15 mL 2  . Insulin Glargine (LANTUS SOLOSTAR) 100 UNIT/ML Solostar Pen INJECT 40 UNITS SUBCUTANEOUSLY ONCE DAILY AT  10PM 5 pen 11  . Insulin Pen Needle (BD PEN NEEDLE NANO U/F) 32G X 4 MM MISC USE TO INJECT INSULIN 4 TIMES DAILY AS DIRECTED. 130 each 11  . losartan (COZAAR) 100 MG tablet TAKE ONE TABLET BY MOUTH ONCE DAILY 90 tablet 0  . Multiple Vitamin (MULTIVITAMIN) tablet Take 1 tablet by mouth daily.      Marland Kitchen NOVOLOG FLEXPEN 100 UNIT/ML FlexPen INJECT 15-18 UNITS SUBCUTANEOUSLY THREE TIMES DAILY WITH MEALS 15 pen 3  . PHARMACIST CHOICE LANCETS MISC     . simvastatin (ZOCOR) 40 MG tablet TAKE ONE-HALF TABLET BY MOUTH AT BEDTIME 45 tablet 1  . SURE COMFORT PEN NEEDLES 31G X 8 MM MISC     .  traMADol (ULTRAM) 50 MG tablet Take 1 tablet (50 mg total) by mouth every 6 (six) hours as needed. 30 tablet 0  . traZODone (DESYREL) 150 MG  tablet TAKE ONE TABLET BY MOUTH AT BEDTIME AS NEEDED 90 tablet 3  . venlafaxine XR (EFFEXOR-XR) 75 MG 24 hr capsule TAKE ONE CAPSULE BY MOUTH ONCE DAILY 90 capsule 0   No current facility-administered medications on file prior to visit.     BP 124/64   Temp 97.9 F (36.6 C) (Oral)   Ht 5\' 5"  (1.651 m)   Wt 175 lb (79.4 kg)   BMI 29.12 kg/m       Objective:   Physical Exam  Constitutional: She appears well-developed and well-nourished. No distress.  HENT:  Head: Normocephalic and atraumatic.  Right Ear: Hearing, tympanic membrane, external ear and ear canal normal.  Left Ear: Hearing, tympanic membrane, external ear and ear canal normal.  Nose: Rhinorrhea present.  Mouth/Throat: Oropharynx is clear and moist.  Eyes: Conjunctivae and EOM are normal. Pupils are equal, round, and reactive to light. Right eye exhibits no discharge. Left eye exhibits no discharge. No scleral icterus.  Cardiovascular: Normal rate, regular rhythm, normal heart sounds and intact distal pulses.  Exam reveals no gallop.   No murmur heard. Pulmonary/Chest: Effort normal. No respiratory distress. She has wheezes. She has no rales.  Skin: Skin is warm and dry. No rash noted. She is not diaphoretic. No erythema. No pallor.  Psychiatric: She has a normal mood and affect. Her behavior is normal. Judgment and thought content normal.  Vitals reviewed.     Assessment & Plan:   1. URI (upper respiratory infection) - DG Chest 2 View; Future - There is chronic elevation of the right hemidiaphragm. Slightly increased markings are present at both lung bases. The heart and pulmonary vascularity are normal. The mediastinum is normal in width. There is no pleural effusion. There is tortuosity of the descending thoracic aorta with calcification in the wall of the aortic  arch.  IMPRESSION: Chronic right hemidiaphragm elevation. Minimal bibasilar atelectasis. No definite pneumonia.  - I will treat for pneumonia to be on the safe side.  - Doxycycline 100mg  BID for 7 days - Prednisone 20 mg x 5 days.  - Take prednisone with plenty of water to keep blood sugars down  - Follow up with PCP if no improvement in the next 2-3 days   Dorothyann Peng, NP

## 2016-04-05 ENCOUNTER — Other Ambulatory Visit: Payer: Self-pay | Admitting: Adult Health

## 2016-04-05 DIAGNOSIS — J069 Acute upper respiratory infection, unspecified: Secondary | ICD-10-CM

## 2016-04-06 NOTE — Telephone Encounter (Signed)
Please do not refill these medications.  inform patient that she still feeling ill and she needs to be reevaluated

## 2016-04-06 NOTE — Telephone Encounter (Signed)
Patient states that her son called in the refill for these medications, but she states that she does not need them. Patient states that she is feeling much better, and is doing really well.

## 2016-04-19 ENCOUNTER — Other Ambulatory Visit: Payer: Self-pay | Admitting: Internal Medicine

## 2016-05-04 ENCOUNTER — Telehealth: Payer: Self-pay

## 2016-05-04 NOTE — Telephone Encounter (Signed)
Call to [name] [name] regarding AWV. Can come 11/2 at 2pm and scheduled

## 2016-05-05 ENCOUNTER — Ambulatory Visit (INDEPENDENT_AMBULATORY_CARE_PROVIDER_SITE_OTHER): Payer: Medicare Other

## 2016-05-05 VITALS — BP 130/70 | HR 90 | Ht 65.0 in | Wt 181.1 lb

## 2016-05-05 DIAGNOSIS — Z0000 Encounter for general adult medical examination without abnormal findings: Secondary | ICD-10-CM

## 2016-05-05 DIAGNOSIS — Z780 Asymptomatic menopausal state: Secondary | ICD-10-CM

## 2016-05-05 NOTE — Patient Instructions (Addendum)
[name] [name] , Thank you for taking time to come for your Medicare Wellness Visit. I appreciate your ongoing commitment to your health goals. Please review the following plan we discussed and let me know if I can assist you in the future.   Will have dexa scan at [facility] They will call for schedule  HCTZ refilled   These are the goals we discussed: Goals    . patient          Maintain my health; keep engaged socially        This is a list of the screening recommended for you and due dates:  Health Maintenance  Topic Date Due  . DEXA scan (bone density measurement)  06/12/1996  . Flu Shot  02/02/2016  . Complete foot exam   02/26/2016  . Eye exam for diabetics  08/30/2016  . Hemoglobin A1C  09/26/2016  . Tetanus Vaccine  04/06/2021  . Shingles Vaccine  Completed  . Pneumonia vaccines  Completed    Food Choices to Lower Your Triglycerides Triglycerides are a type of fat in your blood. High levels of triglycerides can increase the risk of heart disease and stroke. If your triglyceride levels are high, the foods you eat and your eating habits are very important. Choosing the right foods can help lower your triglycerides.  WHAT GENERAL GUIDELINES DO I NEED TO FOLLOW?  Lose weight if you are overweight.   Limit or avoid alcohol.   Fill one half of your plate with vegetables and green salads.   Limit fruit to two servings a day. Choose fruit instead of juice.   Make one fourth of your plate whole grains. Look for the word "whole" as the first word in the ingredient list.  Fill one fourth of your plate with lean protein foods.  Enjoy fatty fish (such as salmon, mackerel, sardines, and tuna) three times a week.   Choose healthy fats.   Limit foods high in starch and sugar.  Eat more home-cooked food and less restaurant, buffet, and fast food.  Limit fried foods.  Cook foods using methods other than frying.  Limit saturated fats.  Check ingredient lists to avoid  foods with partially hydrogenated oils (trans fats) in them. WHAT FOODS CAN I EAT?  Grains Whole grains, such as whole wheat or whole grain breads, crackers, cereals, and pasta. Unsweetened oatmeal, bulgur, barley, quinoa, or brown rice. Corn or whole wheat flour tortillas.  Vegetables Fresh or frozen vegetables (raw, steamed, roasted, or grilled). Green salads. Fruits All fresh, canned (in natural juice), or frozen fruits. Meat and Other Protein Products Ground beef (85% or leaner), grass-fed beef, or beef trimmed of fat. Skinless chicken or [place]. Ground chicken or [place]. Pork trimmed of fat. All fish and seafood. Eggs. Dried beans, peas, or lentils. Unsalted nuts or seeds. Unsalted canned or dry beans. Dairy Low-fat dairy products, such as skim or 1% milk, 2% or reduced-fat cheeses, low-fat ricotta or cottage cheese, or plain low-fat yogurt. Fats and Oils Tub margarines without trans fats. Light or reduced-fat mayonnaise and salad dressings. Avocado. Safflower, olive, or canola oils. Natural peanut or almond butter. The items listed above may not be a complete list of recommended foods or beverages. Contact your dietitian for more options. WHAT FOODS ARE NOT RECOMMENDED?  Grains White bread. White pasta. White rice. Cornbread. Bagels, pastries, and croissants. Crackers that contain trans fat. Vegetables White potatoes. Corn. Creamed or fried vegetables. Vegetables in a cheese sauce. Fruits Dried fruits. Canned fruit  in light or heavy syrup. Fruit juice. Meat and Other Protein Products Fatty cuts of meat. Ribs, chicken wings, bacon, sausage, bologna, salami, chitterlings, fatback, hot dogs, bratwurst, and packaged luncheon meats. Dairy Whole or 2% milk, cream, half-and-half, and cream cheese. Whole-fat or sweetened yogurt. Full-fat cheeses. Nondairy creamers and whipped toppings. Processed cheese, cheese spreads, or cheese curds. Sweets and Desserts Corn syrup, sugars, honey, and  molasses. Candy. Jam and jelly. Syrup. Sweetened cereals. Cookies, pies, cakes, donuts, muffins, and ice cream. Fats and Oils Butter, stick margarine, lard, shortening, ghee, or bacon fat. Coconut, palm kernel, or palm oils. Beverages Alcohol. Sweetened drinks (such as sodas, lemonade, and fruit drinks or punches). The items listed above may not be a complete list of foods and beverages to avoid. Contact your dietitian for more information.   This information is not intended to replace advice given to you by your health care provider. Make sure you discuss any questions you have with your health care provider.   Document Released: 04/07/2004 Document Revised: 07/11/2014 Document Reviewed: 04/24/2013 Elsevier Interactive Patient Education 2016 [place] in the Home  Falls can cause injuries. They can happen to people of all ages. There are many things you can do to make your home safe and to help prevent falls.  WHAT CAN I DO ON THE OUTSIDE OF MY HOME?  Regularly fix the edges of walkways and driveways and fix any cracks.  Remove anything that might make you trip as you walk through a door, such as a raised step or threshold.  Trim any bushes or trees on the path to your home.  Use bright outdoor lighting.  Clear any walking paths of anything that might make someone trip, such as rocks or tools.  Regularly check to see if handrails are loose or broken. Make sure that both sides of any steps have handrails.  Any raised decks and porches should have guardrails on the edges.  Have any leaves, snow, or ice cleared regularly.  Use sand or salt on walking paths during winter.  Clean up any spills in your garage right away. This includes oil or grease spills. WHAT CAN I DO IN THE BATHROOM?   Use night lights.  Install grab bars by the toilet and in the tub and shower. Do not use towel bars as grab bars.  Use non-skid mats or decals in the tub or  shower.  If you need to sit down in the shower, use a plastic, non-slip stool.  Keep the floor dry. Clean up any water that spills on the floor as soon as it happens.  Remove soap buildup in the tub or shower regularly.  Attach bath mats securely with double-sided non-slip rug tape.  Do not have throw rugs and other things on the floor that can make you trip. WHAT CAN I DO IN THE BEDROOM?  Use night lights.  Make sure that you have a light by your bed that is easy to reach.  Do not use any sheets or blankets that are too big for your bed. They should not hang down onto the floor.  Have a firm chair that has side arms. You can use this for support while you get dressed.  Do not have throw rugs and other things on the floor that can make you trip. WHAT CAN I DO IN THE KITCHEN?  Clean up any spills right away.  Avoid walking on wet floors.  Keep items that you  use a lot in easy-to-reach places.  If you need to reach something above you, use a strong step stool that has a grab bar.  Keep electrical cords out of the way.  Do not use floor polish or wax that makes floors slippery. If you must use wax, use non-skid floor wax.  Do not have throw rugs and other things on the floor that can make you trip. WHAT CAN I DO WITH MY STAIRS?  Do not leave any items on the stairs.  Make sure that there are handrails on both sides of the stairs and use them. Fix handrails that are broken or loose. Make sure that handrails are as long as the stairways.  Check any carpeting to make sure that it is firmly attached to the stairs. Fix any carpet that is loose or worn.  Avoid having throw rugs at the top or bottom of the stairs. If you do have throw rugs, attach them to the floor with carpet tape.  Make sure that you have a light switch at the top of the stairs and the bottom of the stairs. If you do not have them, ask someone to add them for you. WHAT ELSE CAN I DO TO HELP PREVENT  FALLS?  Wear shoes that:  Do not have high heels.  Have rubber bottoms.  Are comfortable and fit you well.  Are closed at the toe. Do not wear sandals.  If you use a stepladder:  Make sure that it is fully opened. Do not climb a closed stepladder.  Make sure that both sides of the stepladder are locked into place.  Ask someone to hold it for you, if possible.  Clearly mark and make sure that you can see:  Any grab bars or handrails.  First and last steps.  Where the edge of each step is.  Use tools that help you move around (mobility aids) if they are needed. These include:  Canes.  Walkers.  Scooters.  Crutches.  Turn on the lights when you go into a dark area. Replace any light bulbs as soon as they burn out.  Set up your furniture so you have a clear path. Avoid moving your furniture around.  If any of your floors are uneven, fix them.  If there are any pets around you, be aware of where they are.  Review your medicines with your doctor. Some medicines can make you feel dizzy. This can increase your chance of falling. Ask your doctor what other things that you can do to help prevent falls.   This information is not intended to replace advice given to you by your health care provider. Make sure you discuss any questions you have with your health care provider.   Document Released: 04/16/2009 Document Revised: 11/04/2014 Document Reviewed: 07/25/2014 Elsevier Interactive Patient Education 2016 [place] Maintenance, Female Adopting a healthy lifestyle and getting preventive care can go a long way to promote health and wellness. Talk with your health care provider about what schedule of regular examinations is right for you. This is a good chance for you to check in with your provider about disease prevention and staying healthy. In between checkups, there are plenty of things you can do on your own. Experts have done a lot of research about which  lifestyle changes and preventive measures are most likely to keep you healthy. Ask your health care provider for more information. WEIGHT AND DIET  Eat a healthy diet  Be sure to  include plenty of vegetables, fruits, low-fat dairy products, and lean protein.  Do not eat a lot of foods high in solid fats, added sugars, or salt.  Get regular exercise. This is one of the most important things you can do for your health.  Most adults should exercise for at least 150 minutes each week. The exercise should increase your heart rate and make you sweat (moderate-intensity exercise).  Most adults should also do strengthening exercises at least twice a week. This is in addition to the moderate-intensity exercise.  Maintain a healthy weight  Body mass index (BMI) is a measurement that can be used to identify possible weight problems. It estimates body fat based on height and weight. Your health care provider can help determine your BMI and help you achieve or maintain a healthy weight.  For females 59 years of age and older:   A BMI below 18.5 is considered underweight.  A BMI of 18.5 to 24.9 is normal.  A BMI of 25 to 29.9 is considered overweight.  A BMI of 30 and above is considered obese.  Watch levels of cholesterol and blood lipids  You should start having your blood tested for lipids and cholesterol at 80 years of age, then have this test every 5 years.  You may need to have your cholesterol levels checked more often if:  Your lipid or cholesterol levels are high.  You are older than 80 years of age.  You are at high risk for heart disease.  CANCER SCREENING   Lung Cancer  Lung cancer screening is recommended for adults 75-23 years old who are at high risk for lung cancer because of a history of smoking.  A yearly low-dose CT scan of the lungs is recommended for people who:  Currently smoke.  Have quit within the past 15 years.  Have at least a 30-pack-year history of  smoking. A pack year is smoking an average of one pack of cigarettes a day for 1 year.  Yearly screening should continue until it has been 15 years since you quit.  Yearly screening should stop if you develop a health problem that would prevent you from having lung cancer treatment.  Breast Cancer  Practice breast self-awareness. This means understanding how your breasts normally appear and feel.  It also means doing regular breast self-exams. Let your health care provider know about any changes, no matter how small.  If you are in your 20s or 30s, you should have a clinical breast exam (CBE) by a health care provider every 1-3 years as part of a regular health exam.  If you are 5 or older, have a CBE every year. Also consider having a breast X-ray (mammogram) every year.  If you have a family history of breast cancer, talk to your health care provider about genetic screening.  If you are at high risk for breast cancer, talk to your health care provider about having an MRI and a mammogram every year.  Breast cancer gene (BRCA) assessment is recommended for women who have family members with BRCA-related cancers. BRCA-related cancers include:  Breast.  Ovarian.  Tubal.  Peritoneal cancers.  Results of the assessment will determine the need for genetic counseling and BRCA1 and BRCA2 testing. Cervical Cancer Your health care provider may recommend that you be screened regularly for cancer of the pelvic organs (ovaries, uterus, and vagina). This screening involves a pelvic examination, including checking for microscopic changes to the surface of your cervix (Pap test). You  may be encouraged to have this screening done every 3 years, beginning at age 66.  For women ages 48-65, health care providers may recommend pelvic exams and Pap testing every 3 years, or they may recommend the Pap and pelvic exam, combined with testing for human papilloma virus (HPV), every 5 years. Some types of  HPV increase your risk of cervical cancer. Testing for HPV may also be done on women of any age with unclear Pap test results.  Other health care providers may not recommend any screening for nonpregnant women who are considered low risk for pelvic cancer and who do not have symptoms. Ask your health care provider if a screening pelvic exam is right for you.  If you have had past treatment for cervical cancer or a condition that could lead to cancer, you need Pap tests and screening for cancer for at least 20 years after your treatment. If Pap tests have been discontinued, your risk factors (such as having a new sexual partner) need to be reassessed to determine if screening should resume. Some women have medical problems that increase the chance of getting cervical cancer. In these cases, your health care provider may recommend more frequent screening and Pap tests. Colorectal Cancer  This type of cancer can be detected and often prevented.  Routine colorectal cancer screening usually begins at 80 years of age and continues through 80 years of age.  Your health care provider may recommend screening at an earlier age if you have risk factors for colon cancer.  Your health care provider may also recommend using home test kits to check for hidden blood in the stool.  A small camera at the end of a tube can be used to examine your colon directly (sigmoidoscopy or colonoscopy). This is done to check for the earliest forms of colorectal cancer.  Routine screening usually begins at age 73.  Direct examination of the colon should be repeated every 5-10 years through 80 years of age. However, you may need to be screened more often if early forms of precancerous polyps or small growths are found. Skin Cancer  Check your skin from head to toe regularly.  Tell your health care provider about any new moles or changes in moles, especially if there is a change in a mole's shape or color.  Also tell your  health care provider if you have a mole that is larger than the size of a pencil eraser.  Always use sunscreen. Apply sunscreen liberally and repeatedly throughout the day.  Protect yourself by wearing long sleeves, pants, a wide-brimmed hat, and sunglasses whenever you are outside. HEART DISEASE, DIABETES, AND HIGH BLOOD PRESSURE   High blood pressure causes heart disease and increases the risk of stroke. High blood pressure is more likely to develop in:  People who have blood pressure in the high end of the normal range (130-139/85-89 mm Hg).  People who are overweight or obese.  People who are African American.  If you are 5-87 years of age, have your blood pressure checked every 3-5 years. If you are 107 years of age or older, have your blood pressure checked every year. You should have your blood pressure measured twice--once when you are at a hospital or clinic, and once when you are not at a hospital or clinic. Record the average of the two measurements. To check your blood pressure when you are not at a hospital or clinic, you can use:  An automated blood pressure machine at a  pharmacy.  A home blood pressure monitor.  If you are between 36 years and 89 years old, ask your health care provider if you should take aspirin to prevent strokes.  Have regular diabetes screenings. This involves taking a blood sample to check your fasting blood sugar level.  If you are at a normal weight and have a low risk for diabetes, have this test once every three years after 80 years of age.  If you are overweight and have a high risk for diabetes, consider being tested at a younger age or more often. PREVENTING INFECTION  Hepatitis B  If you have a higher risk for hepatitis B, you should be screened for this virus. You are considered at high risk for hepatitis B if:  You were born in a country where hepatitis B is common. Ask your health care provider which countries are considered high  risk.  Your parents were born in a high-risk country, and you have not been immunized against hepatitis B (hepatitis B vaccine).  You have HIV or AIDS.  You use needles to inject street drugs.  You live with someone who has hepatitis B.  You have had sex with someone who has hepatitis B.  You get hemodialysis treatment.  You take certain medicines for conditions, including cancer, organ transplantation, and autoimmune conditions. Hepatitis C  Blood testing is recommended for:  Everyone born from [birth year] through 1963.  Anyone with known risk factors for hepatitis C. Sexually transmitted infections (STIs)  You should be screened for sexually transmitted infections (STIs) including gonorrhea and chlamydia if:  You are sexually active and are younger than 80 years of age.  You are older than 80 years of age and your health care provider tells you that you are at risk for this type of infection.  Your sexual activity has changed since you were last screened and you are at an increased risk for chlamydia or gonorrhea. Ask your health care provider if you are at risk.  If you do not have HIV, but are at risk, it may be recommended that you take a prescription medicine daily to prevent HIV infection. This is called pre-exposure prophylaxis (PrEP). You are considered at risk if:  You are sexually active and do not regularly use condoms or know the HIV status of your partner(s).  You take drugs by injection.  You are sexually active with a partner who has HIV. Talk with your health care provider about whether you are at high risk of being infected with HIV. If you choose to begin PrEP, you should first be tested for HIV. You should then be tested every 3 months for as long as you are taking PrEP.  PREGNANCY   If you are premenopausal and you may become pregnant, ask your health care provider about preconception counseling.  If you may become pregnant, take 400 to 800 micrograms (mcg)  of folic acid every day.  If you want to prevent pregnancy, talk to your health care provider about birth control (contraception). OSTEOPOROSIS AND MENOPAUSE   Osteoporosis is a disease in which the bones lose minerals and strength with aging. This can result in serious bone fractures. Your risk for osteoporosis can be identified using a bone density scan.  If you are 66 years of age or older, or if you are at risk for osteoporosis and fractures, ask your health care provider if you should be screened.  Ask your health care provider whether you should take a calcium or  vitamin D supplement to lower your risk for osteoporosis.  Menopause may have certain physical symptoms and risks.  Hormone replacement therapy may reduce some of these symptoms and risks. Talk to your health care provider about whether hormone replacement therapy is right for you.  HOME CARE INSTRUCTIONS   Schedule regular health, dental, and eye exams.  Stay current with your immunizations.   Do not use any tobacco products including cigarettes, chewing tobacco, or electronic cigarettes.  If you are pregnant, do not drink alcohol.  If you are breastfeeding, limit how much and how often you drink alcohol.  Limit alcohol intake to no more than 1 drink per day for nonpregnant women. One drink equals 12 ounces of beer, 5 ounces of wine, or 1 ounces of hard liquor.  Do not use street drugs.  Do not share needles.  Ask your health care provider for help if you need support or information about quitting drugs.  Tell your health care provider if you often feel depressed.  Tell your health care provider if you have ever been abused or do not feel safe at home.   This information is not intended to replace advice given to you by your health care provider. Make sure you discuss any questions you have with your health care provider.   Document Released: 01/03/2011 Document Revised: 07/11/2014 Document Reviewed:  05/22/2013 Elsevier Interactive Patient Education [employer].

## 2016-05-05 NOTE — Progress Notes (Addendum)
Subjective:   [name] [name] is a 82 y.o. female who presents for Medicare Annual (Subsequent) preventive examination.  The Patient was informed that the wellness visit is to identify future health risk and educate and initiate measures that can reduce risk for increased disease through the lifespan.   HRA completed during the assessment   NO ROS; Medicare Wellness Visit  Describes health as good, fair or great? States she had Walking pneumonia this year;   Preventive Screening -Counseling & Management  Mammgram; 10/2008/ declines  Dexa / declined at first; discussed getting one if she had a fx; decided she would go ahead and get one.   Current smoking/ tobacco status/ quit over 35 years ago 30 pack hx ongoing or quit dates less than 15; ETOH neg  Psychosocial Lives alone in home; single level  Marriage was 43 years  Son lives next door  3 children(one adopted); 5 grand kids; 9 great grands; 5 step great grands Could name all her grands and step grands    RISK FACTORS  Regular exercise "not really"  Son wakes her up in the am;  [facility] the [facility] a [facility]  States this makes her week   Diet Really does not eat breakfast;  Will go to the [facility] and get sausage biscuit or other  Has lunch at [facility]; doesn't really enjoy her lunch  Supper: Chicken and dumplings;  Vegetables sometimes  Cheese sticks; peanut butter crackers   BS running 126-146; since amaryl started  At hs; running sometimes it is lower than am    Fall risk no   Mobility of Functional changes this year?  no Wants to age in place;  Safety; community, wears sunscreen - not in the sun very much , safe place for firearms; Motor vehicle accidents; no  Cardiac Risk Factors:  Advanced aged  Hyperlipidemia: trig 383 / HDL 45   Diabetes/ endocrine fup / A1c up to 9.1/ on amaryl  Family History ( mother had DVT; Father had copd; asthma)  Obesity/ would love to lose 10 to 15  lbs;    Depression Screen/ taking venlafaxine XR  PhQ 2: negative  Activities of Daily Living - See functional screen   Hearing Difficulty: good / right year better;   Ophthalmology Exam: dr. Luberta Mutter; NO DR; 08/2015  Cognitive testing; Ad8 score; 0 or less than 2  MMSE deferred or completed if AD8 + 2 issues  Advanced Directives - will take AD and given to her children to review and discuss   List the name of Physicians or other Practitioners you currently use: see note below   Immunization History  Administered Date(s) Administered  . Influenza Whole 04/03/2006, 05/09/2007, 03/20/2008, 04/20/2009, 03/12/2010  . Influenza, High Dose Seasonal PF 03/04/2016  . Influenza,inj,Quad PF,36+ Mos 03/20/2014  . Influenza-Unspecified 03/11/2015  . Pneumococcal Conjugate-13 03/24/2014, 12/05/2014  . Pneumococcal Polysaccharide-23 07/04/2001  . Td 07/04/2001  . Tdap 04/07/2011  . Zoster 10/28/2010   Required Immunizations needed today  Screening test up to date or reviewed for plan of completion Health Maintenance Due  Topic Date Due  . DEXA SCAN  06/12/1996  . INFLUENZA VACCINE  02/02/2016  . FOOT EXAM  02/26/2016   Ordered Dexa Flu taken at [place] and documented Foot exam completed today   The following information was reviewed  Allergies; Medications; Past Medical Hx; Problem list; Surgical hx; Family hx; Social Hx   Cardiac Risk Factors include: advanced  age (>101men, >48 women);diabetes mellitus;hypertension;obesity (BMI >30kg/m2)     Objective:     Vitals: BP 130/70   Pulse 90   Ht 5\' 5"  (1.651 m)   Wt 181 lb 2 oz (82.2 kg)   SpO2 96%   BMI 30.14 kg/m   Body mass index is 30.14 kg/m.   Tobacco History  Smoking Status  . Former Smoker  . Packs/day: 10.00  Smokeless Tobacco  . Never Used    Comment: quit > 31 yo      Counseling given: Yes   Past Medical History:  Diagnosis Date  . Asthma   . DEGENERATIVE JOINT DISEASE, GENERALIZED  02/01/2007   Qualifier: Diagnosis of  By: Burnice Logan  MD, Doretha Sou   . DIARRHEA, CHRONIC 08/20/2009   Qualifier: Diagnosis of  By: Burnice Logan  MD, Doretha Sou   . DM 02/01/2007   Qualifier: Diagnosis of  By: Floyde Parkins    . FATIGUE 10/20/2008   Qualifier: Diagnosis of  By: Burnice Logan  MD, Doretha Sou   . HYPERCHOLESTEROLEMIA 02/01/2007   Qualifier: Diagnosis of  By: Floyde Parkins    . HYPERLIPIDEMIA 12/20/2007   Qualifier: Diagnosis of  By: Burnice Logan  MD, Doretha Sou   . HYPERTENSION 02/01/2007   Qualifier: Diagnosis of  By: Floyde Parkins    . LOW BACK PAIN 01/13/2010   Qualifier: Diagnosis of  By: Burnice Logan  MD, Doretha Sou MUSCLE CRAMPS 04/06/2010   Qualifier: Diagnosis of  By: Burnice Logan  MD, Doretha Sou   . MYALGIA 01/20/2010   Qualifier: Diagnosis of  By: Elease Hashimoto MD, [place]    . OSTEOARTHRITIS 01/13/2010   Qualifier: Diagnosis of  By: Burnice Logan  MD, Doretha Sou   . OVERACTIVE BLADDER 08/20/2009   Qualifier: Diagnosis of  By: Burnice Logan  MD, Doretha Sou   . PLEURISY 06/18/2008   Qualifier: Diagnosis of  By: Niel Hummer MD, [place] PNEUMONIA 06/18/2008   Qualifier: Diagnosis of  By: Niel Hummer MD, [place] Rectal prolapse 09/20/2007   Qualifier: Diagnosis of  By: Burnice Logan  MD, Doretha Sou   . VITAMIN B12 DEFICIENCY 07/20/2010   Qualifier: Diagnosis of  By: Carlean Purl MD, Dimas Millin    Past Surgical History:  Procedure Laterality Date  . ABDOMINAL HYSTERECTOMY     partial- states she has her ovaries  . APPENDECTOMY    . BLADDER SURGERY    . CATARACT EXTRACTION Bilateral 2012  . CHOLECYSTECTOMY     Family History  Problem Relation Age of Onset  . Deep vein thrombosis Mother   . COPD Father   . Asthma Father   . Diabetes Neg Hx     family hx  . Cancer Neg Hx     breast ca - sister(S)?  Marland Kitchen Heart disease Neg Hx     family hx   History  Sexual Activity  . Sexual activity: Not on file    Outpatient Encounter Prescriptions as of 05/05/2016  Medication Sig  . Acetaminophen 650 MG TABS  Take 1 tablet (650 mg total) by mouth 3 (three) times daily as needed.  Marland Kitchen ADVOCATE REDI-CODE test strip   . albuterol (PROVENTIL,VENTOLIN) 90 MCG/ACT inhaler Inhale 2 puffs into the lungs every 6 (six) hours as needed.  . Albuterol Sulfate (PROAIR RESPICLICK) 604 (90 BASE) MCG/ACT AEPB Inhale 2 application into the lungs 4 (four) times daily as needed.  Marland Kitchen aspirin 81 MG tablet Take 81 mg by mouth daily.    Marland Kitchen  benzonatate (TESSALON) 200 MG capsule Take 1 capsule (200 mg total) by mouth 3 (three) times daily as needed for cough.  . Blood Glucose Calibration (ACCU-CHEK AVIVA) SOLN   . Blood Glucose Calibration (ADVOCATE REDI-CODE+ CONTROL) LOW SOLN   . calcium-vitamin D (OSCAL WITH D 500-200) 500-200 MG-UNIT per tablet Take 1 tablet by mouth daily.    . cyanocobalamin 1000 MCG tablet Take 100 mcg by mouth daily.    . fluticasone (FLONASE) 50 MCG/ACT nasal spray Place 1 spray into the nose daily.  Marland Kitchen glimepiride (AMARYL) 4 MG tablet Take 1 tablet (4 mg total) by mouth 2 (two) times daily before a meal.  . glucose blood (ACCU-CHEK AVIVA) test strip Use as instructed  . hydrochlorothiazide (MICROZIDE) 12.5 MG capsule Take 1 capsule (12.5 mg total) by mouth every morning.  Marland Kitchen HYDROcodone-acetaminophen (NORCO/VICODIN) 5-325 MG per tablet TAKE ONE TABLET BY MOUTH EVERY 6 HOURS AS NEEDED FOR PAIN  . ibuprofen (ADVIL,MOTRIN) 600 MG tablet TAKE ONE TABLET BY MOUTH EVERY 6 HOURS AS NEEDED FOR PAIN  . insulin aspart (NOVOLOG FLEXPEN) 100 UNIT/ML FlexPen Inject 16-20 Units into the skin 3 (three) times daily with meals.  . Insulin Glargine (LANTUS SOLOSTAR) 100 UNIT/ML Solostar Pen INJECT 40 UNITS SUBCUTANEOUSLY ONCE DAILY AT  10PM  . Insulin Pen Needle (BD PEN NEEDLE NANO U/F) 32G X 4 MM MISC USE TO INJECT INSULIN 4 TIMES DAILY AS DIRECTED.  Marland Kitchen losartan (COZAAR) 100 MG tablet TAKE ONE TABLET BY MOUTH ONCE DAILY  . Multiple Vitamin (MULTIVITAMIN) tablet Take 1 tablet by mouth daily.    Marland Kitchen NOVOLOG FLEXPEN 100 UNIT/ML  FlexPen INJECT 15-18 UNITS SUBCUTANEOUSLY THREE TIMES DAILY WITH MEALS  . PHARMACIST CHOICE LANCETS MISC   . simvastatin (ZOCOR) 40 MG tablet TAKE ONE-HALF TABLET BY MOUTH AT BEDTIME  . SURE COMFORT PEN NEEDLES 31G X 8 MM MISC   . traMADol (ULTRAM) 50 MG tablet Take 1 tablet (50 mg total) by mouth every 6 (six) hours as needed.  . traZODone (DESYREL) 150 MG tablet TAKE ONE TABLET BY MOUTH AT BEDTIME AS NEEDED  . venlafaxine XR (EFFEXOR-XR) 75 MG 24 hr capsule TAKE ONE CAPSULE BY MOUTH ONCE DAILY  . [DISCONTINUED] predniSONE (DELTASONE) 20 MG tablet Take 1 tablet (20 mg total) by mouth daily with breakfast.  . [DISCONTINUED] doxycycline (VIBRAMYCIN) 100 MG capsule Take 1 capsule (100 mg total) by mouth 2 (two) times daily. (Patient not taking: Reported on 05/05/2016)   No facility-administered encounter medications on file as of 05/05/2016.     Activities of Daily Living In your present state of health, do you have any difficulty performing the following activities: 05/05/2016  Hearing? N  Vision? N  Difficulty concentrating or making decisions? N  Walking or climbing stairs? N  Dressing or bathing? N  Doing errands, shopping? N  Preparing Food and eating ? N  Using the Toilet? N  In the past six months, have you accidently leaked urine? N  Do you have problems with loss of bowel control? N  Managing your Medications? N  Managing your Finances? N  Housekeeping or managing your Housekeeping? N  Some recent data might be hidden    Patient Care Team: Marletta Lor, MD as PCP - General    Assessment:     ASSESSMENT INCLUDED:  Weight loss intended; 10 lbs Will continue to stay actively engaged. Very talkative; loves family; good support   Educated on triglycerides in food   Review for health history including a  functional assessment, fall risk, depression screen, memory loss, vision and hearing screens; Was educated and referred as appropriate.    Psychosocial risk  reviewed as stress; unresolved grief; pain; lack of support; lack of income to buy groceries, meds etc.  Behavioral risk addressed such as tobacco, ETOH; diet (metabolic syndrome) and lack of exercise but does get up and go a lot   Risk for independent living or long term plan; good plan in place; no d/a; no memory issues; good family support   Risk for safety; Bathroom; community; firearms, sun protection; auto accidents   All immunizations and overdue screens were reviewed for a plan or follow-up. Had flu vaccine   Labs were reviewed in regard to Lipids and A1c if appropriate.   Discussed Recommended screenings and documented any personalized health advice and referrals for preventive counseling.  See AVS for patient instructions;    Exercise Activities and Dietary recommendations Current Exercise Habits: Home exercise routine, Type of exercise: walking, Time (Minutes): 30, Frequency (Times/Week): 5, Weekly Exercise (Minutes/Week): 150, Intensity: Mild  Goals    . patient          Maintain my health; keep engaged socially       Fall Risk Fall Risk  05/05/2016 06/08/2015  Falls in the past year? No No   Depression Screen PHQ 2/9 Scores 05/05/2016 06/08/2015  PHQ - 2 Score 0 0     Cognitive Function MMSE - Mini Mental State Exam 05/05/2016  Not completed: (No Data)        Immunization History  Administered Date(s) Administered  . Influenza Whole 04/03/2006, 05/09/2007, 03/20/2008, 04/20/2009, 03/12/2010  . Influenza, High Dose Seasonal PF 03/04/2016  . Influenza,inj,Quad PF,36+ Mos 03/20/2014  . Influenza-Unspecified 03/11/2015  . Pneumococcal Conjugate-13 03/24/2014, 12/05/2014  . Pneumococcal Polysaccharide-23 07/04/2001  . Td 07/04/2001  . Tdap 04/07/2011  . Zoster 10/28/2010   Screening Tests Health Maintenance  Topic Date Due  . DEXA SCAN  06/12/1996  . INFLUENZA VACCINE  02/02/2016  . FOOT EXAM  02/26/2016  . OPHTHALMOLOGY EXAM  08/30/2016  . HEMOGLOBIN  A1C  09/26/2016  . TETANUS/TDAP  04/06/2021  . ZOSTAVAX  Completed  . PNA vac Low Risk Adult  Completed      Plan:   1. Flu vaccine at the [facility]  2. Educated regarding triglycerides  3. Did take an Advanced Directive and plans to have family take a look at it  4. Apt with Dr. Raliegh Ip to fup CPE/ labs and needs refill on narco.  5. Foot exam completed   6. Agreed to dexa; states she has never had one Order placed.   During the course of the visit the patient was educated and counseled about the following appropriate screening and preventive services:   Vaccines to include Pneumoccal, Influenza, Hepatitis B, Td, Zostavax, HCV  Electrocardiogram  Cardiovascular Disease  Colorectal cancer screening  Bone density screening- ordered   Diabetes screening/ BS better 120 to 140 in the am  Glaucoma screening  Mammography/declines   Nutrition counseling   Patient Instructions (the written plan) was given to the patient.   MBTDH,RCBUL, RN  05/05/2016   Encounter reviewed and agree with findings.  [name] [name]

## 2016-05-10 ENCOUNTER — Other Ambulatory Visit: Payer: Self-pay | Admitting: Internal Medicine

## 2016-05-12 ENCOUNTER — Ambulatory Visit: Payer: Medicare Other | Admitting: Internal Medicine

## 2016-05-16 ENCOUNTER — Ambulatory Visit (INDEPENDENT_AMBULATORY_CARE_PROVIDER_SITE_OTHER): Payer: Medicare Other | Admitting: Internal Medicine

## 2016-05-16 ENCOUNTER — Ambulatory Visit (INDEPENDENT_AMBULATORY_CARE_PROVIDER_SITE_OTHER)
Admission: RE | Admit: 2016-05-16 | Discharge: 2016-05-16 | Disposition: A | Discharge status: Home / Self Care | Payer: Medicare Other | Source: Ambulatory Visit | Attending: Internal Medicine | Admitting: Internal Medicine

## 2016-05-16 ENCOUNTER — Encounter: Payer: Self-pay | Admitting: Internal Medicine

## 2016-05-16 VITALS — BP 102/60 | HR 82 | Temp 97.9°F | Resp 20 | Ht 65.0 in | Wt 181.4 lb

## 2016-05-16 DIAGNOSIS — E7800 Pure hypercholesterolemia, unspecified: Secondary | ICD-10-CM

## 2016-05-16 DIAGNOSIS — M159 Polyosteoarthritis, unspecified: Secondary | ICD-10-CM

## 2016-05-16 DIAGNOSIS — E1121 Type 2 diabetes mellitus with diabetic nephropathy: Secondary | ICD-10-CM | POA: Diagnosis not present

## 2016-05-16 DIAGNOSIS — Z794 Long term (current) use of insulin: Secondary | ICD-10-CM

## 2016-05-16 DIAGNOSIS — M150 Primary generalized (osteo)arthritis: Secondary | ICD-10-CM

## 2016-05-16 DIAGNOSIS — Z780 Asymptomatic menopausal state: Secondary | ICD-10-CM | POA: Diagnosis not present

## 2016-05-16 DIAGNOSIS — M8949 Other hypertrophic osteoarthropathy, multiple sites: Secondary | ICD-10-CM

## 2016-05-16 DIAGNOSIS — I10 Essential (primary) hypertension: Secondary | ICD-10-CM

## 2016-05-16 NOTE — Progress Notes (Signed)
Pre visit review using our clinic review tool, if applicable. No additional management support is needed unless otherwise documented below in the visit note. 

## 2016-05-16 NOTE — Progress Notes (Signed)
Subjective:    Patient ID: [name] [name], female    DOB: July 14, 1928, 82 y.o.   MRN: [id number]  HPI  82 year old patient who is seen today after a near 1 year absence.  She has been followed by endocrine for diabetes which has not been well controlled  Lab Results  Component Value Date   HGBA1C 9.4 03/29/2016   She remains on basal bolus insulin, 4 injections daily as well as SU therapy.  No hypoglycemia She is scheduled for endocrine follow-up soon  She has had a recent Medicare wellness exam  She has treated hypertension which has been well-controlled.  She has osteoarthritis and chronic low back pain. She has B12 deficiency, treated with oral medication  No new concerns or complaints  Past Medical History:  Diagnosis Date  . Asthma   . DEGENERATIVE JOINT DISEASE, GENERALIZED 02/01/2007   Qualifier: Diagnosis of  By: Burnice Logan  MD, Doretha Sou   . DIARRHEA, CHRONIC 08/20/2009   Qualifier: Diagnosis of  By: Burnice Logan  MD, Doretha Sou   . DM 02/01/2007   Qualifier: Diagnosis of  By: Floyde Parkins    . FATIGUE 10/20/2008   Qualifier: Diagnosis of  By: Burnice Logan  MD, Doretha Sou   . HYPERCHOLESTEROLEMIA 02/01/2007   Qualifier: Diagnosis of  By: Floyde Parkins    . HYPERLIPIDEMIA 12/20/2007   Qualifier: Diagnosis of  By: Burnice Logan  MD, Doretha Sou   . HYPERTENSION 02/01/2007   Qualifier: Diagnosis of  By: Floyde Parkins    . LOW BACK PAIN 01/13/2010   Qualifier: Diagnosis of  By: Burnice Logan  MD, Doretha Sou MUSCLE CRAMPS 04/06/2010   Qualifier: Diagnosis of  By: Burnice Logan  MD, Doretha Sou   . MYALGIA 01/20/2010   Qualifier: Diagnosis of  By: Elease Hashimoto MD, [place]    . OSTEOARTHRITIS 01/13/2010   Qualifier: Diagnosis of  By: Burnice Logan  MD, Doretha Sou   . OVERACTIVE BLADDER 08/20/2009   Qualifier: Diagnosis of  By: Burnice Logan  MD, Doretha Sou   . PLEURISY 06/18/2008   Qualifier: Diagnosis of  By: Niel Hummer MD, [place] PNEUMONIA 06/18/2008   Qualifier: Diagnosis of  By: Niel Hummer MD, [place] Rectal prolapse 09/20/2007   Qualifier: Diagnosis of  By: Burnice Logan  MD, Doretha Sou   . VITAMIN B12 DEFICIENCY 07/20/2010   Qualifier: Diagnosis of  By: Carlean Purl MD, Dimas Millin      Social History   Social History  . Marital status: Single    Spouse name: N/A  . Number of children: N/A  . Years of education: N/A   Occupational History  . Not on file.   Social History Main Topics  . Smoking status: Former Smoker    Packs/day: 10.00  . Smokeless tobacco: Never Used     Comment: quit > 60 yo   . Alcohol use No  . Drug use: No  . Sexual activity: Not on file   Other Topics Concern  . Not on file   Social History Narrative  . No narrative on file    Past Surgical History:  Procedure Laterality Date  . ABDOMINAL HYSTERECTOMY     partial- states she has her ovaries  . APPENDECTOMY    . BLADDER SURGERY    . CATARACT EXTRACTION Bilateral 2012  . CHOLECYSTECTOMY      Family History  Problem Relation Age of Onset  . Deep vein thrombosis Mother   . COPD Father   .  Asthma Father   . Diabetes Neg Hx     family hx  . Cancer Neg Hx     breast ca - sister(S)?  Marland Kitchen Heart disease Neg Hx     family hx    Allergies  Allergen Reactions  . Metformin     REACTION: diarrhea  . Sitagliptin Phosphate     REACTION: abdominal pain    Current Outpatient Prescriptions on File Prior to Visit  Medication Sig Dispense Refill  . Acetaminophen 650 MG TABS Take 1 tablet (650 mg total) by mouth 3 (three) times daily as needed. 30 tablet 0  . ADVOCATE REDI-CODE test strip   99  . albuterol (PROVENTIL,VENTOLIN) 90 MCG/ACT inhaler Inhale 2 puffs into the lungs every 6 (six) hours as needed. 34 g 3  . Albuterol Sulfate (PROAIR RESPICLICK) 836 (90 BASE) MCG/ACT AEPB Inhale 2 application into the lungs 4 (four) times daily as needed. 1 each 3  . aspirin 81 MG tablet Take 81 mg by mouth daily.      . Blood Glucose Calibration (ACCU-CHEK AVIVA) SOLN     . Blood Glucose Calibration  (ADVOCATE REDI-CODE+ CONTROL) LOW SOLN     . calcium-vitamin D (OSCAL WITH D 500-200) 500-200 MG-UNIT per tablet Take 1 tablet by mouth daily.      . cyanocobalamin 1000 MCG tablet Take 100 mcg by mouth daily.      . fluticasone (FLONASE) 50 MCG/ACT nasal spray Place 1 spray into the nose daily. 16 g 0  . glimepiride (AMARYL) 4 MG tablet Take 1 tablet (4 mg total) by mouth 2 (two) times daily before a meal. 180 tablet 3  . hydrochlorothiazide (MICROZIDE) 12.5 MG capsule Take 1 capsule (12.5 mg total) by mouth every morning. 90 capsule 3  . ibuprofen (ADVIL,MOTRIN) 600 MG tablet TAKE ONE TABLET BY MOUTH EVERY 6 HOURS AS NEEDED FOR PAIN 60 tablet 2  . insulin aspart (NOVOLOG FLEXPEN) 100 UNIT/ML FlexPen Inject 16-20 Units into the skin 3 (three) times daily with meals. 15 mL 2  . Insulin Glargine (LANTUS SOLOSTAR) 100 UNIT/ML Solostar Pen INJECT 40 UNITS SUBCUTANEOUSLY ONCE DAILY AT  10PM 5 pen 11  . Insulin Pen Needle (BD PEN NEEDLE NANO U/F) 32G X 4 MM MISC USE TO INJECT INSULIN 4 TIMES DAILY AS DIRECTED. 130 each 11  . losartan (COZAAR) 100 MG tablet TAKE ONE TABLET BY MOUTH ONCE DAILY 90 tablet 1  . Multiple Vitamin (MULTIVITAMIN) tablet Take 1 tablet by mouth daily.      Marland Kitchen PHARMACIST CHOICE LANCETS MISC     . simvastatin (ZOCOR) 40 MG tablet TAKE ONE-HALF TABLET BY MOUTH AT BEDTIME 45 tablet 1  . SURE COMFORT PEN NEEDLES 31G X 8 MM MISC     . traZODone (DESYREL) 150 MG tablet TAKE ONE TABLET BY MOUTH AT BEDTIME AS NEEDED 90 tablet 3  . venlafaxine XR (EFFEXOR-XR) 75 MG 24 hr capsule TAKE ONE CAPSULE BY MOUTH ONCE DAILY 90 capsule 1  . HYDROcodone-acetaminophen (NORCO/VICODIN) 5-325 MG per tablet TAKE ONE TABLET BY MOUTH EVERY 6 HOURS AS NEEDED FOR PAIN (Patient not taking: Reported on 05/16/2016) 90 tablet 0   No current facility-administered medications on file prior to visit.     BP 102/60 (BP Location: Left Arm, Patient Position: Sitting, Cuff Size: Normal)   Pulse 82   Temp 97.9 F  (36.6 C) (Oral)   Resp 20   Ht 5\' 5"  (1.651 m)   Wt 181 lb 6.1 oz (82.3 kg)  SpO2 98%   BMI 30.18 kg/m     Review of Systems  Constitutional: Negative.   HENT: Negative for congestion, dental problem, hearing loss, rhinorrhea, sinus pressure, sore throat and tinnitus.   Eyes: Negative for pain, discharge and visual disturbance.  Respiratory: Negative for cough and shortness of breath.   Cardiovascular: Negative for chest pain, palpitations and leg swelling.  Gastrointestinal: Negative for abdominal distention, abdominal pain, blood in stool, constipation, diarrhea, nausea and vomiting.  Genitourinary: Negative for difficulty urinating, dysuria, flank pain, frequency, hematuria, pelvic pain, urgency, vaginal bleeding, vaginal discharge and vaginal pain.  Musculoskeletal: Positive for arthralgias and back pain. Negative for gait problem and joint swelling.  Skin: Negative for rash.  Neurological: Negative for dizziness, syncope, speech difficulty, weakness, numbness and headaches.  Hematological: Negative for adenopathy.  Psychiatric/Behavioral: Negative for agitation, behavioral problems and dysphoric mood. The patient is not nervous/anxious.        Objective:   Physical Exam  Constitutional: She is oriented to person, place, and time. She appears well-developed and well-nourished. No distress.  Blood pressure low normal  HENT:  Head: Normocephalic.  Right Ear: External ear normal.  Left Ear: External ear normal.  Mouth/Throat: Oropharynx is clear and moist.  Eyes: Conjunctivae and EOM are normal. Pupils are equal, round, and reactive to light.  Neck: Normal range of motion. Neck supple. No thyromegaly present.  Cardiovascular: Normal rate, regular rhythm, normal heart sounds and intact distal pulses.   Pedal pulses full  Pulmonary/Chest: Effort normal. No respiratory distress. She has wheezes.  A few faint scattered wheezing  Abdominal: Soft. Bowel sounds are normal. She  exhibits no mass. There is no tenderness.  Musculoskeletal: Normal range of motion.  Lymphadenopathy:    She has no cervical adenopathy.  Neurological: She is alert and oriented to person, place, and time.  Skin: Skin is warm and dry. No rash noted.  Psychiatric: She has a normal mood and affect. Her behavior is normal.          Assessment & Plan:   Diabetes mellitus.  Patient is on basal bolus insulin therapy as well as SU therapy.  No documented hypoglycemia.  Endocrine follow-up is scheduled soon Hypertension.  Blood pressure well controlled Osteoarthritis, stable Dyslipidemia.  Continue statin therapy  Follow-ups 6 months or as needed  [employer]

## 2016-05-16 NOTE — Patient Instructions (Signed)
Limit your sodium (Salt) intake  Please check your blood pressure on a regular basis.  If it is consistently greater than 150/90, please make an office appointment.   Please check your hemoglobin A1c every 3 months  Follow-up endocrinology and ophthalmology  Please see your eye doctor yearly to check for diabetic eye damage

## 2016-05-17 ENCOUNTER — Ambulatory Visit (INDEPENDENT_AMBULATORY_CARE_PROVIDER_SITE_OTHER): Payer: Medicare Other | Admitting: Internal Medicine

## 2016-05-17 ENCOUNTER — Encounter: Payer: Self-pay | Admitting: Internal Medicine

## 2016-05-17 VITALS — BP 130/70 | HR 74 | Wt 180.0 lb

## 2016-05-17 DIAGNOSIS — Z794 Long term (current) use of insulin: Secondary | ICD-10-CM | POA: Diagnosis not present

## 2016-05-17 DIAGNOSIS — E1121 Type 2 diabetes mellitus with diabetic nephropathy: Secondary | ICD-10-CM

## 2016-05-17 NOTE — Patient Instructions (Addendum)
Patient Instructions  Please continue:  - Lantus 40 units at bedtime - NovoLog   18 units with a regular meal  20 units for a larger meal  - Amaryl 4 mg 2x a day, before meals.  Please let me know if the sugars are consistently <70 or >200.  Please return in 3 months with your sugar log.

## 2016-05-17 NOTE — Progress Notes (Signed)
Patient ID: [name] [name], female   DOB: 1929/02/13, 82 y.o.   MRN: [id number]  HPI: [name] [name] is a 82 y.o.-year-old femaleyear-old female, returning for f/u for DM2, dx 2005, insulin-dependent, uncontrolled, with complications (nephropathy). Last visit 1.5 mo ago. Ins.: [name] [name].  She had PNA >> treated >> still some URI sxs.   Last hemoglobin A1c was: Lab Results  Component Value Date   HGBA1C 9.4 03/29/2016   HGBA1C 7.8 12/28/2015   HGBA1C 8.4 09/22/2015  previously 9.6%, previously 8.8% 2 years ago.  Pt is on a regimen of:  - Amaryl 4 mg twice a day before - Lantus 40 units at bedtime - NovoLog 2-3x a day:  18 units with a regular meal  20 units for a larger meal We had to stop Metformin 2/2 increased creatinine. We stopped Amaryl at last visit.  Pt checks her sugars 2-3x a day >> better sugars. No log, no meter. - am: 130-180 >> 112-176 >> 126-149, 162 >> 59, 63-145, 159 >> low 200s >> 120-150 - 2h after b'fast: 140-192 >> 116, 134-168, 175 >> 98-141,182 >> n/c - lunch: 170 >> 168 >> n/c >> 178-214, 248 >> 167-189, 242 >> 75 >> n/c - 2h after lunch: 299 >> 161, 181 >> 120-150 - before dinner: 127-168 >> 178 >> n/c >> 180, 192 >> n/c - later: 200s >> 164-302, 380 >>159,  179-302 >> 126- 299 >> high 200s >> 160-210 No lows. Lowest sugar was 120 >> 112 >> 59 >> 160 >> 120. She has hypoglycemia awareness around 70.  Highest 380 >> 299 >> 300s >> 200s.  Pt has chronic kidney disease stage 3-4, last BUN/creatinine was:  Lab Results  Component Value Date   BUN 26 (H) 03/29/2016   CREATININE 1.46 (H) 03/29/2016  Not taking Losartan. Last set of lipids: Lab Results  Component Value Date   CHOL 167 03/29/2016   HDL 49.70 03/29/2016   LDLDIRECT 99.0 03/29/2016   TRIG 224.0 (H) 03/29/2016   CHOLHDL 3 03/29/2016  On Zocor 1/2 tab (20 mg) daily. Pt's last eye exam was in 08/31/2015, with Dr. Susa Loffler at [facility] ophthalmology. No DR.  Denies numbness and tingling  in her legs.  ROS: Constitutional: no weight gain/loss, + fatigue, no subjective hyperthermia/hypothermia Eyes: no blurry vision, no xerophthalmia ENT: no sore throat, no nodules palpated in throat, no dysphagia/odynophagia, no hoarseness Cardiovascular: no CP/SOB/palpitations/leg swelling Respiratory: + cough/no SOB/no wheezing Gastrointestinal: no N/V/D/C Musculoskeletal: no muscle aches/no joint aches Skin: no rashes Neurological: no tremors/numbness/tingling/dizziness  I reviewed pt's medications, allergies, PMH, social hx, family hx, and changes were documented in the history of present illness. Otherwise, unchanged from my initial visit note.  PE: BP 130/70   Pulse 74   Wt 180 lb (81.6 kg)   SpO2 94%   BMI 29.95 kg/m  Body mass index is 29.95 kg/m. Wt Readings from Last 3 Encounters:  05/17/16 180 lb (81.6 kg)  05/16/16 181 lb 6.1 oz (82.3 kg)  05/05/16 181 lb 2 oz (82.2 kg)   Constitutional: overweight, in NAD Eyes: PERRLA, EOMI, no exophthalmos ENT: moist mucous membranes, no thyromegaly, no cervical lymphadenopathy Cardiovascular: RRR, SEM 2/6, no RG Respiratory: Crackles in R lung base Gastrointestinal: abdomen soft, NT, ND, BS+ Musculoskeletal: no deformities, strength intact in all 4 Skin: moist, warm, no rashes  ASSESSMENT: 1. DM2, insulin-dependent, uncontrolled, without complications - She does have kidney dysfunction  PLAN:  1. Patient with long-standing diabetes, with worse control at this  visit. We have tried to stop Amaryl, but sugars were higher >> we restarted it at last visit and continued the same insulin doses. Her sugars are better now. Will continue current regimen. - I advised her to:   Patient Instructions  Please continue:  - Lantus 40 units at bedtime - NovoLog   18 units with a regular meal  20 units for a larger meal  - Amaryl 4 mg 2x a day, before meals.  Please let me know if the sugars are consistently <70 or >200.  Please  return in 3 months with your sugar log.  - she got her flu shot this season - [facility] - UTD with eye exams - I will see her back in 3 months with her sugar log  Philemon Kingdom, MD PhD [facility] End[facility]

## 2016-07-04 ENCOUNTER — Other Ambulatory Visit: Payer: Self-pay | Admitting: Internal Medicine

## 2016-07-18 ENCOUNTER — Other Ambulatory Visit: Payer: Self-pay

## 2016-08-01 ENCOUNTER — Telehealth: Payer: Self-pay | Admitting: Internal Medicine

## 2016-08-01 NOTE — Telephone Encounter (Signed)
Please call in a prescription for generic Lomotil No. 30.  2 initially, then 1 every 4 hours as needed for diarrhea.  Clear liquid diet until improved

## 2016-08-01 NOTE — Telephone Encounter (Signed)
Pt is calling in to say she is having a great deal of diarrhea and is wanting advice on anything OTC she can take.  States that she has tried [employer] with no luck and is worried about even trying to drive here for an appt as she is scared she will soil herself.  Patient wants to know if there is something that can be called in for her. She can be reached back at [phone].

## 2016-08-01 NOTE — Telephone Encounter (Signed)
See message below, Please advise

## 2016-08-02 MED ORDER — DIPHENOXYLATE-ATROPINE 2.5-0.025 MG PO TABS: 1.0000 | ORAL_TABLET | Freq: Four times a day (QID) | ORAL | 0 refills | Status: DC | PRN

## 2016-08-02 NOTE — Telephone Encounter (Signed)
Pt made aware and verbalized understanding.

## 2016-08-02 NOTE — Addendum Note (Signed)
Addended by: Abelardo Diesel on: 08/02/2016 08:38 AM   Modules accepted: Orders

## 2016-08-02 NOTE — Telephone Encounter (Signed)
Prescription for generic Lomotil No. 30.  2 initially, then 1 every 4 hours as needed for diarrhea was called in to [facility]. Called pt to inform, no answer a message was left to call office back. Dr would like pt to start a  Clear liquid diet until improved

## 2016-08-15 DIAGNOSIS — Z794 Long term (current) use of insulin: Secondary | ICD-10-CM | POA: Diagnosis not present

## 2016-08-15 DIAGNOSIS — G4700 Insomnia, unspecified: Secondary | ICD-10-CM | POA: Diagnosis not present

## 2016-08-15 DIAGNOSIS — E669 Obesity, unspecified: Secondary | ICD-10-CM | POA: Diagnosis not present

## 2016-08-15 DIAGNOSIS — I10 Essential (primary) hypertension: Secondary | ICD-10-CM | POA: Diagnosis not present

## 2016-08-15 DIAGNOSIS — Z599 Problem related to housing and economic circumstances, unspecified: Secondary | ICD-10-CM | POA: Diagnosis not present

## 2016-08-15 DIAGNOSIS — E119 Type 2 diabetes mellitus without complications: Secondary | ICD-10-CM | POA: Diagnosis not present

## 2016-08-15 DIAGNOSIS — Z87891 Personal history of nicotine dependence: Secondary | ICD-10-CM | POA: Diagnosis not present

## 2016-08-15 DIAGNOSIS — Z6830 Body mass index (BMI) 30.0-30.9, adult: Secondary | ICD-10-CM | POA: Diagnosis not present

## 2016-08-15 DIAGNOSIS — Z7902 Long term (current) use of antithrombotics/antiplatelets: Secondary | ICD-10-CM | POA: Diagnosis not present

## 2016-08-15 DIAGNOSIS — M159 Polyosteoarthritis, unspecified: Secondary | ICD-10-CM | POA: Diagnosis not present

## 2016-08-15 DIAGNOSIS — J4520 Mild intermittent asthma, uncomplicated: Secondary | ICD-10-CM | POA: Diagnosis not present

## 2016-08-15 DIAGNOSIS — Z9181 History of falling: Secondary | ICD-10-CM | POA: Diagnosis not present

## 2016-08-15 DIAGNOSIS — H9319 Tinnitus, unspecified ear: Secondary | ICD-10-CM | POA: Diagnosis not present

## 2016-08-15 DIAGNOSIS — Z0000 Encounter for general adult medical examination without abnormal findings: Secondary | ICD-10-CM | POA: Diagnosis not present

## 2016-08-15 DIAGNOSIS — E785 Hyperlipidemia, unspecified: Secondary | ICD-10-CM | POA: Diagnosis not present

## 2016-08-15 DIAGNOSIS — R69 Illness, unspecified: Secondary | ICD-10-CM | POA: Diagnosis not present

## 2016-08-15 DIAGNOSIS — Z7982 Long term (current) use of aspirin: Secondary | ICD-10-CM | POA: Diagnosis not present

## 2016-08-15 DIAGNOSIS — Z79899 Other long term (current) drug therapy: Secondary | ICD-10-CM | POA: Diagnosis not present

## 2016-08-15 DIAGNOSIS — Z79891 Long term (current) use of opiate analgesic: Secondary | ICD-10-CM | POA: Diagnosis not present

## 2016-08-18 ENCOUNTER — Ambulatory Visit (INDEPENDENT_AMBULATORY_CARE_PROVIDER_SITE_OTHER): Payer: Medicare Other | Admitting: Internal Medicine

## 2016-08-18 ENCOUNTER — Encounter: Payer: Self-pay | Admitting: Internal Medicine

## 2016-08-18 VITALS — BP 124/74 | HR 90 | Ht 65.0 in | Wt 177.0 lb

## 2016-08-18 DIAGNOSIS — E1121 Type 2 diabetes mellitus with diabetic nephropathy: Secondary | ICD-10-CM | POA: Diagnosis not present

## 2016-08-18 DIAGNOSIS — Z794 Long term (current) use of insulin: Secondary | ICD-10-CM | POA: Diagnosis not present

## 2016-08-18 LAB — POCT GLYCOSYLATED HEMOGLOBIN (HGB A1C): Hemoglobin A1C: 8.7

## 2016-08-18 NOTE — Patient Instructions (Addendum)
Please continue:  - Amaryl 4 mg 2x a day, before meals.  - Lantus 40 units at bedtime  Please increase: - NovoLog   20 units with a smaller meal  24-26 units for a larger meal   Please let me know if the sugars are consistently <70 or >200.  Please return in 3 months with your sugar log.

## 2016-08-18 NOTE — Addendum Note (Signed)
Addended by: Caprice Beaver T on: 08/18/2016 02:34 PM   Modules accepted: Orders

## 2016-08-18 NOTE — Progress Notes (Signed)
Patient ID: [name] [name], female   DOB: 08-Feb-1929, 83 y.o.   MRN: [id number]  HPI: [name] [name] is a 83 y.o.-year-old female, returning for f/u for DM2, dx 2005, insulin-dependent, uncontrolled, with complications (nephropathy). Last visit 3 mo ago. Ins.: changed from [place] to [place].  Last hemoglobin A1c was: Lab Results  Component Value Date   HGBA1C 9.4 03/29/2016   HGBA1C 7.8 12/28/2015   HGBA1C 8.4 09/22/2015  previously 9.6%, previously 8.8% 2 years ago.  Pt is on a regimen of:  - Amaryl 4 mg twice a day before - Lantus 40 units at bedtime - NovoLog 2-3x a day: - sometimes she forgets 2-3x a week  18 units with a regular meal  20 units for a larger meal We had to stop Metformin 2/2 increased creatinine. We stopped Amaryl at last visit.  Pt checks her sugars 3x a day - brings a good log: - am: 112-176 >> 126-149, 162 >> 59, 63-145, 159 >> low 200s >> 120-150 >> 131-168, 199 - 2h after b'fast: 140-192 >> 116, 134-168, 175 >> 98-141,182 >> n/c >> 163-282 - lunch: 170 >> 168 >> n/c >> 178-214, 248 >> 167-189, 242 >> 75 >> n/c >>164-185, 254 - 2h after lunch: 299 >> 161, 181 >> 120-150 >> ? - before dinner: 127-168 >> 178 >> n/c >> 180, 192 >> n/c - later: 164-302, 380 >>159,  179-302 >> 126- 299 >> high 200s >> 160-210 >> 156, 198-283, 317 No lows. Lowest sugar was 120. She has hypoglycemia awareness around 70.  Highest 200s.  Pt has chronic kidney disease stage 3-4, last BUN/creatinine was:  Lab Results  Component Value Date   BUN 26 (H) 03/29/2016   CREATININE 1.46 (H) 03/29/2016  Not taking Losartan. Last set of lipids: Lab Results  Component Value Date   CHOL 167 03/29/2016   HDL 49.70 03/29/2016   LDLDIRECT 99.0 03/29/2016   TRIG 224.0 (H) 03/29/2016   CHOLHDL 3 03/29/2016  On Zocor 1/2 tab (20 mg) daily. Pt's last eye exam was in 08/31/2015, with Dr. Susa Loffler at [facility] ophthal[facility]. No DR.  Denies numbness and tingling in her  legs.  ROS: Constitutional: no weight gain/loss, no fatigue, no subjective hyperthermia/hypothermia, + poor sleep Eyes: no blurry vision, no xerophthalmia ENT: no sore throat, no nodules palpated in throat, no dysphagia/odynophagia, no hoarseness, + tinnitus Cardiovascular: no CP/SOB/palpitations/leg swelling Respiratory: no cough/no SOB/no wheezing Gastrointestinal: no N/V/+ D/no C Musculoskeletal: no muscle aches/no joint aches Skin: no rashes Neurological: no tremors/numbness/tingling/dizziness  I reviewed pt's medications, allergies, PMH, social hx, family hx, and changes were documented in the history of present illness. Otherwise, unchanged from my initial visit note.  PE: BP 124/74 (BP Location: Left Arm, Patient Position: Sitting)   Pulse 90   Ht 5\' 5"  (1.651 m)   Wt 177 lb (80.3 kg)   SpO2 95%   BMI 29.45 kg/m  Body mass index is 29.45 kg/m. Wt Readings from Last 3 Encounters:  08/18/16 177 lb (80.3 kg)  05/17/16 180 lb (81.6 kg)  05/16/16 181 lb 6.1 oz (82.3 kg)   Constitutional: overweight, in NAD Eyes: PERRLA, EOMI, no exophthalmos ENT: moist mucous membranes, no thyromegaly, no cervical lymphadenopathy Cardiovascular: RRR, SEM 2/6, no RG Respiratory: Crackles in R lung base Gastrointestinal: abdomen soft, NT, ND, BS+ Musculoskeletal: no deformities, strength intact in all 4 Skin: moist, warm, no rashes  ASSESSMENT: 1. DM2, insulin-dependent, uncontrolled, without complications - She does have kidney dysfunction  We  have tried to stop Amaryl, but sugars were higher >> we restarted it.  PLAN:  1. Patient with long-standing diabetes, with worse control at this visit. Her sugars are higher especially after dinner and occasionally during the day if she forgets Novolog. Will increase insulin with dinner. - HbA1c today slightly better, but still high: 8.7%  - I advised her to:   Patient Instructions  Please continue:  - Amaryl 4 mg 2x a day, before meals.  -  Lantus 40 units at bedtime  Please increase: - NovoLog   20 units with a smaller meal  24-26 units for a larger meal   Please let me know if the sugars are consistently <70 or >200.  Please return in 3 months with your sugar log.  - she got her flu shot this season - [facility] - UTD with eye exams, but needs one soon >> scheduled for 11/2016 - I will see her back in 3 months with her sugar log  Philemon Kingdom, MD PhD [facility] Endocrinology

## 2016-08-27 ENCOUNTER — Encounter (HOSPITAL_COMMUNITY): Payer: Self-pay | Admitting: Emergency Medicine

## 2016-08-27 ENCOUNTER — Ambulatory Visit (INDEPENDENT_AMBULATORY_CARE_PROVIDER_SITE_OTHER): Payer: Medicare HMO

## 2016-08-27 ENCOUNTER — Ambulatory Visit (HOSPITAL_COMMUNITY)
Admission: EM | Admit: 2016-08-27 | Discharge: 2016-08-27 | Disposition: A | Discharge status: Home / Self Care | Payer: Medicare HMO | Attending: Family Medicine | Admitting: Family Medicine

## 2016-08-27 DIAGNOSIS — J40 Bronchitis, not specified as acute or chronic: Secondary | ICD-10-CM

## 2016-08-27 DIAGNOSIS — R05 Cough: Secondary | ICD-10-CM | POA: Diagnosis not present

## 2016-08-27 MED ORDER — PREDNISONE 20 MG PO TABS: 20.0000 mg | ORAL_TABLET | Freq: Every day | ORAL | 0 refills | Status: DC

## 2016-08-27 MED ORDER — AZITHROMYCIN 250 MG PO TABS: 250.0000 mg | ORAL_TABLET | Freq: Every day | ORAL | 0 refills | Status: DC

## 2016-08-27 MED ORDER — BENZONATATE 100 MG PO CAPS: 100.0000 mg | ORAL_CAPSULE | Freq: Three times a day (TID) | ORAL | 0 refills | Status: DC

## 2016-08-27 NOTE — Discharge Instructions (Signed)
Your chest x-ray showed no signs of pneumonia, or cardiopulmonary disease. Findings were similar to previous chest x-rays  I am treating you for bronchitis. I have prescribed Azithromycin. Take 2 tablets today, then 1 tablet daily till finished. I have also prescribed a steroid called prednisone. Take one tablet daily, for 5 days.For cough, I have prescribed a medication called Tessalon. Take 1 tablet every 8 hours as needed for your cough.  Should your symptoms fail to improve or worsen, follow up with your primary care provider, or return to clinic.

## 2016-08-27 NOTE — ED Triage Notes (Signed)
Pt has been suffering from a cough for two weeks.  She presents today with a fever of 100.4.

## 2016-08-27 NOTE — ED Provider Notes (Signed)
CSN: [id number]     Arrival date & time 08/27/16  1258 History   First MD Initiated Contact with Patient 08/27/16 1358     Chief Complaint  Patient presents with  . Cough  . Fever   (Consider location/radiation/quality/duration/timing/severity/associated sxs/prior Treatment) 83 year old female patient presents to clinic with a 2 week history of cough, and intermittent fevers, along with fatigue. She describes her cough as being continuous, wet, and hacking with green sputum. Her fever is she reports is been intermittent occurring on some days but not others. She has had reduced appetite, states she has not wanted to eat, however she has been drinking normally. She denies any decrease in activity levels, she has not had dyspnea with exertion, she reports she has been going to her regular exercises at the [facility].   The history is provided by the patient.  Cough  Associated symptoms: fever   Fever  Associated symptoms: cough     Past Medical History:  Diagnosis Date  . Asthma   . DEGENERATIVE JOINT DISEASE, GENERALIZED 02/01/2007   Qualifier: Diagnosis of  By: Burnice Logan  MD, Doretha Sou   . DIARRHEA, CHRONIC 08/20/2009   Qualifier: Diagnosis of  By: Burnice Logan  MD, Doretha Sou   . DM 02/01/2007   Qualifier: Diagnosis of  By: Floyde Parkins    . FATIGUE 10/20/2008   Qualifier: Diagnosis of  By: Burnice Logan  MD, Doretha Sou   . HYPERCHOLESTEROLEMIA 02/01/2007   Qualifier: Diagnosis of  By: Floyde Parkins    . HYPERLIPIDEMIA 12/20/2007   Qualifier: Diagnosis of  By: Burnice Logan  MD, Doretha Sou   . HYPERTENSION 02/01/2007   Qualifier: Diagnosis of  By: Floyde Parkins    . LOW BACK PAIN 01/13/2010   Qualifier: Diagnosis of  By: Burnice Logan  MD, Doretha Sou MUSCLE CRAMPS 04/06/2010   Qualifier: Diagnosis of  By: Burnice Logan  MD, Doretha Sou   . MYALGIA 01/20/2010   Qualifier: Diagnosis of  By: Elease Hashimoto MD, [place]    . OSTEOARTHRITIS 01/13/2010   Qualifier: Diagnosis of  By: Burnice Logan  MD, Doretha Sou   .  OVERACTIVE BLADDER 08/20/2009   Qualifier: Diagnosis of  By: Burnice Logan  MD, Doretha Sou   . PLEURISY 06/18/2008   Qualifier: Diagnosis of  By: Niel Hummer MD, [place] PNEUMONIA 06/18/2008   Qualifier: Diagnosis of  By: Niel Hummer MD, [place] Rectal prolapse 09/20/2007   Qualifier: Diagnosis of  By: Burnice Logan  MD, Doretha Sou   . VITAMIN B12 DEFICIENCY 07/20/2010   Qualifier: Diagnosis of  By: Carlean Purl MD, Dimas Millin    Past Surgical History:  Procedure Laterality Date  . ABDOMINAL HYSTERECTOMY     partial- states she has her ovaries  . APPENDECTOMY    . BLADDER SURGERY    . CATARACT EXTRACTION Bilateral 2012  . CHOLECYSTECTOMY     Family History  Problem Relation Age of Onset  . Deep vein thrombosis Mother   . COPD Father   . Asthma Father   . Diabetes Neg Hx     family hx  . Cancer Neg Hx     breast ca - sister(S)?  Marland Kitchen Heart disease Neg Hx     family hx   Social History  Substance Use Topics  . Smoking status: Former Smoker    Packs/day: 10.00  . Smokeless tobacco: Never Used     Comment: quit > 37 yo   . Alcohol use No  OB History    No data available     Review of Systems  Reason unable to perform ROS: As covered in history of present illness.  Constitutional: Positive for fever.  Respiratory: Positive for cough.   All other systems reviewed and are negative.   Allergies  Metformin and Sitagliptin phosphate  Home Medications   Prior to Admission medications   Medication Sig Start Date End Date Taking? Authorizing Provider  Acetaminophen 650 MG TABS Take 1 tablet (650 mg total) by mouth 3 (three) times daily as needed. 12/19/12  Yes Randa Spike, MD  ADVOCATE REDI-CODE test strip  05/09/14  Yes Historical Provider, MD  albuterol (PROVENTIL,VENTOLIN) 90 MCG/ACT inhaler Inhale 2 puffs into the lungs every 6 (six) hours as needed. 03/16/11  Yes Marletta Lor, MD  aspirin 81 MG tablet Take 81 mg by mouth daily.     Yes Historical Provider, MD   Blood Glucose Calibration (ACCU-CHEK AVIVA) SOLN  05/09/15  Yes Historical Provider, MD  Blood Glucose Calibration (ADVOCATE REDI-CODE+ CONTROL) LOW SOLN  03/02/14  Yes Historical Provider, MD  calcium-vitamin D (OSCAL WITH D 500-200) 500-200 MG-UNIT per tablet Take 1 tablet by mouth daily.     Yes Historical Provider, MD  cyanocobalamin 1000 MCG tablet Take 100 mcg by mouth daily.     Yes Historical Provider, MD  diphenoxylate-atropine (LOMOTIL) 2.5-0.025 MG tablet Take 1 tablet by mouth 4 (four) times daily as needed for diarrhea or loose stools. Take 2 tablets initially then take 1 tablet every 4 hours as needed for diarrhea. 08/02/16  Yes Marletta Lor, MD  fluticasone [facility]) 50 MCG/ACT nasal spray Place 1 spray into the nose daily. 12/19/12  Yes Adlih Moreno-Coll, MD  glimepiride (AMARYL) 4 MG tablet Take 1 tablet (4 mg total) by mouth 2 (two) times daily before a meal. 03/29/16  Yes Philemon Kingdom, MD  glimepiride (AMARYL) 4 MG tablet TAKE ONE TABLET BY MOUTH TWICE DAILY 07/05/16  Yes Philemon Kingdom, MD  hydrochlorothiazide (MICROZIDE) 12.5 MG capsule Take 1 capsule (12.5 mg total) by mouth every morning. 01/31/12  Yes Marletta Lor, MD  ibuprofen (ADVIL,MOTRIN) 600 MG tablet TAKE ONE TABLET BY MOUTH EVERY 6 HOURS AS NEEDED FOR PAIN 01/07/16  Yes Marletta Lor, MD  insulin aspart (NOVOLOG FLEXPEN) 100 UNIT/ML FlexPen Inject 16-20 Units into the skin 3 (three) times daily with meals. 09/22/15  Yes Philemon Kingdom, MD  Insulin Glargine (LANTUS SOLOSTAR) 100 UNIT/ML Solostar Pen INJECT 40 UNITS SUBCUTANEOUSLY ONCE DAILY AT  10PM 02/01/16  Yes Philemon Kingdom, MD  Insulin Pen Needle (BD PEN NEEDLE NANO U/F) 32G X 4 MM MISC USE TO INJECT INSULIN 4 TIMES DAILY AS DIRECTED. 03/24/15  Yes Philemon Kingdom, MD  losartan (COZAAR) 100 MG tablet TAKE ONE TABLET BY MOUTH ONCE DAILY 05/10/16  Yes Marletta Lor, MD  Multiple Vitamin (MULTIVITAMIN) tablet Take 1 tablet by mouth daily.      Yes Historical Provider, MD  PHARMACIST CHOICE LANCETS [place]  03/02/14  Yes Historical Provider, MD  simvastatin (ZOCOR) 40 MG tablet TAKE ONE-HALF TABLET BY MOUTH AT BEDTIME 07/05/16  Yes Marletta Lor, MD  SURE COMFORT PEN NEEDLES 31G X 8 MM [place]  05/09/15  Yes Historical Provider, MD  traZODone (DESYREL) 150 MG tablet TAKE ONE TABLET BY MOUTH AT BEDTIME AS NEEDED 02/16/16  Yes Marletta Lor, MD  venlafaxine XR (EFFEXOR-XR) 75 MG 24 hr capsule TAKE ONE CAPSULE BY MOUTH ONCE DAILY 04/21/16  Yes Doretha Sou  Burnice Logan, MD  Albuterol Sulfate (PROAIR RESPICLICK) 974 (90 BASE) MCG/ACT AEPB Inhale 2 application into the lungs 4 (four) times daily as needed. 06/08/15   Marletta Lor, MD  azithromycin (ZITHROMAX) 250 MG tablet Take 1 tablet (250 mg total) by mouth daily. Take first 2 tablets together, then 1 every day until finished. 08/27/16   Barnet Glasgow, NP  benzonatate (TESSALON) 100 MG capsule Take 1 capsule (100 mg total) by mouth every 8 (eight) hours. 08/27/16   Barnet Glasgow, NP  HYDROcodone-acetaminophen (NORCO/VICODIN) 5-325 MG per tablet TAKE ONE TABLET BY MOUTH EVERY 6 HOURS AS NEEDED FOR PAIN 04/25/14   Marletta Lor, MD  predniSONE (DELTASONE) 20 MG tablet Take 1 tablet (20 mg total) by mouth daily with breakfast. 08/27/16   Barnet Glasgow, NP   Meds Ordered and Administered this Visit  Medications - No data to display  BP (!) 136/52 (BP Location: Left Arm)   Pulse 105   Temp 100.4 F (38 C) (Oral)   Resp 18   SpO2 97%  No data found.   Physical Exam  Constitutional: She is oriented to person, place, and time. She appears well-developed and well-nourished. She does not have a sickly appearance. She does not appear ill. No distress.  HENT:  Head: Normocephalic and atraumatic.  Right Ear: Tympanic membrane and external ear normal.  Left Ear: Tympanic membrane and external ear normal.  Nose: Nose normal. Right sinus exhibits no maxillary sinus tenderness and  no frontal sinus tenderness. Left sinus exhibits no maxillary sinus tenderness and no frontal sinus tenderness.  Mouth/Throat: Uvula is midline and oropharynx is clear and moist. No oropharyngeal exudate.  Eyes: Pupils are equal, round, and reactive to light.  Neck: Normal range of motion. Neck supple. No JVD present.  Cardiovascular: Normal rate and regular rhythm.   Pulmonary/Chest: Effort normal. No respiratory distress. She has no wheezes. She has rhonchi in the right lower field and the left lower field.  Abdominal: Soft. Bowel sounds are normal. She exhibits no distension. There is no tenderness. There is no guarding.  Lymphadenopathy:    She has no cervical adenopathy.  Neurological: She is alert and oriented to person, place, and time.  Skin: Skin is warm and dry. Capillary refill takes less than 2 seconds. She is not diaphoretic.  Psychiatric: She has a normal mood and affect.  Nursing note and vitals reviewed.   Urgent Care Course     Procedures (including critical care time)  Labs Review Labs Reviewed - No data to display  Imaging Review Dg Chest 2 View  Result Date: 08/27/2016 CLINICAL DATA:  Cough, congestion 2 weeks. EXAM: CHEST  2 VIEW COMPARISON:  03/30/2016 FINDINGS: Lungs are adequately inflated with stable moderate elevation the right hemidiaphragm. No evidence of focal airspace consolidation or effusion. Cardiomediastinal silhouette is within normal. There is degenerative change of the spine. IMPRESSION: No active cardiopulmonary disease. Moderate stable elevation of the right hemidiaphragm. Electronically Signed   By: Marin Olp M.D.   On: 08/27/2016 14:42        MDM   1. Bronchitis    Your chest x-ray showed no signs of pneumonia, or cardiopulmonary disease. Findings were similar to previous chest x-rays  I am treating you for bronchitis. I have prescribed Azithromycin. Take 2 tablets today, then 1 tablet daily till finished. I have also prescribed a  steroid called prednisone. Take one tablet daily, for 5 days.For cough, I have prescribed a medication called Tessalon. Take 1 tablet  every 8 hours as needed for your cough.  Should your symptoms fail to improve or worsen, follow up with your primary care provider, or return to clinic.      Barnet Glasgow, NP 08/27/16 [phone]

## 2016-10-05 ENCOUNTER — Other Ambulatory Visit: Payer: Self-pay | Admitting: Internal Medicine

## 2016-10-06 ENCOUNTER — Other Ambulatory Visit: Payer: Self-pay

## 2016-10-06 MED ORDER — GLIMEPIRIDE 4 MG PO TABS: 4.0000 mg | ORAL_TABLET | Freq: Two times a day (BID) | ORAL | 0 refills | Status: DC

## 2016-10-18 ENCOUNTER — Encounter: Payer: Self-pay | Admitting: Family Medicine

## 2016-10-18 ENCOUNTER — Other Ambulatory Visit: Payer: Self-pay | Admitting: Internal Medicine

## 2016-10-18 ENCOUNTER — Ambulatory Visit (INDEPENDENT_AMBULATORY_CARE_PROVIDER_SITE_OTHER): Payer: Medicare HMO | Admitting: Family Medicine

## 2016-10-18 VITALS — BP 130/70 | HR 99 | Temp 99.8°F | Wt 177.5 lb

## 2016-10-18 DIAGNOSIS — J209 Acute bronchitis, unspecified: Secondary | ICD-10-CM | POA: Diagnosis not present

## 2016-10-18 MED ORDER — HYDROCODONE-HOMATROPINE 5-1.5 MG/5ML PO SYRP: 5.0000 mL | ORAL_SOLUTION | Freq: Four times a day (QID) | ORAL | 0 refills | Status: AC | PRN

## 2016-10-18 NOTE — Progress Notes (Signed)
Subjective:     Patient ID: [name] [name], female   DOB: 22-Sep-1928, 83 y.o.   MRN: [id number]  HPI Patient seen as a work in with one-week history of dry cough. She quit smoking many years ago. She's had some nasal congestion. No hemoptysis. No appetite or weight changes. No fever. Taken NyQuil without relief. Has previously taken Tessalon which did not seem to help much. Cough has been severe at times. No dyspnea  Past Medical History:  Diagnosis Date  . Asthma   . DEGENERATIVE JOINT DISEASE, GENERALIZED 02/01/2007   Qualifier: Diagnosis of  By: Amador Cunas  MD, Janett Labella   . DIARRHEA, CHRONIC 08/20/2009   Qualifier: Diagnosis of  By: Amador Cunas  MD, Janett Labella   . DM 02/01/2007   Qualifier: Diagnosis of  By: Calvert Cantor    . FATIGUE 10/20/2008   Qualifier: Diagnosis of  By: Amador Cunas  MD, Janett Labella   . HYPERCHOLESTEROLEMIA 02/01/2007   Qualifier: Diagnosis of  By: Calvert Cantor    . HYPERLIPIDEMIA 12/20/2007   Qualifier: Diagnosis of  By: Amador Cunas  MD, Janett Labella   . HYPERTENSION 02/01/2007   Qualifier: Diagnosis of  By: Calvert Cantor    . LOW BACK PAIN 01/13/2010   Qualifier: Diagnosis of  By: Amador Cunas  MD, Janett Labella MUSCLE CRAMPS 04/06/2010   Qualifier: Diagnosis of  By: Amador Cunas  MD, Janett Labella   . MYALGIA 01/20/2010   Qualifier: Diagnosis of  By: Caryl Never MD, Emilian Stawicki    . OSTEOARTHRITIS 01/13/2010   Qualifier: Diagnosis of  By: Amador Cunas  MD, Janett Labella   . OVERACTIVE BLADDER 08/20/2009   Qualifier: Diagnosis of  By: Amador Cunas  MD, Janett Labella   . PLEURISY 06/18/2008   Qualifier: Diagnosis of  By: Alphonzo Severance MD, Loni Dolly   . PNEUMONIA 06/18/2008   Qualifier: Diagnosis of  By: Alphonzo Severance MD, Loni Dolly   . Rectal prolapse 09/20/2007   Qualifier: Diagnosis of  By: Amador Cunas  MD, Janett Labella   . VITAMIN B12 DEFICIENCY 07/20/2010   Qualifier: Diagnosis of  By: Leone Payor MD, Charlyne Quale    Past Surgical History:  Procedure Laterality Date  . ABDOMINAL HYSTERECTOMY     partial- states she has  her ovaries  . APPENDECTOMY    . BLADDER SURGERY    . CATARACT EXTRACTION Bilateral 2012  . CHOLECYSTECTOMY      reports that she has quit smoking. She smoked 10.00 packs per day. She has never used smokeless tobacco. She reports that she does not drink alcohol or use drugs. family history includes Asthma in her father; COPD in her father; Deep vein thrombosis in her mother. Allergies  Allergen Reactions  . Metformin     REACTION: diarrhea  . Sitagliptin Phosphate     REACTION: abdominal pain     Review of Systems  Constitutional: Negative for appetite change, chills, fever and unexpected weight change.  HENT: Positive for congestion. Negative for sore throat.   Respiratory: Positive for cough. Negative for shortness of breath and wheezing.        Objective:   Physical Exam  Constitutional: She appears well-developed and well-nourished.  HENT:  Right Ear: External ear normal.  Left Ear: External ear normal.  Mouth/Throat: Oropharynx is clear and moist.  Neck: Neck supple.  Cardiovascular: Normal rate and regular rhythm.   Pulmonary/Chest: Effort normal and breath sounds normal. No respiratory distress. She has no wheezes. She has no rales.  Lymphadenopathy:  She has no cervical adenopathy.       Assessment:     Cough. Suspect acute viral bronchitis.    Plan:     -Hycodan cough syrup 1 teaspoon daily at bedtime when necessary severe cough -Follow-up promptly for any fever or increasing shortness of breath  Kristian Covey MD [facility]

## 2016-10-18 NOTE — Patient Instructions (Signed)

## 2016-10-18 NOTE — Progress Notes (Signed)
Pre visit review using our clinic review tool, if applicable. No additional management support is needed unless otherwise documented below in the visit note. 

## 2016-11-15 ENCOUNTER — Encounter: Payer: Self-pay | Admitting: Internal Medicine

## 2016-11-15 ENCOUNTER — Ambulatory Visit (INDEPENDENT_AMBULATORY_CARE_PROVIDER_SITE_OTHER): Payer: Medicare HMO | Admitting: Internal Medicine

## 2016-11-15 VITALS — BP 128/70 | HR 103 | Ht 64.0 in | Wt 176.0 lb

## 2016-11-15 DIAGNOSIS — E1121 Type 2 diabetes mellitus with diabetic nephropathy: Secondary | ICD-10-CM

## 2016-11-15 DIAGNOSIS — Z794 Long term (current) use of insulin: Secondary | ICD-10-CM | POA: Diagnosis not present

## 2016-11-15 LAB — POCT GLYCOSYLATED HEMOGLOBIN (HGB A1C): Hemoglobin A1C: 8.4

## 2016-11-15 NOTE — Progress Notes (Signed)
Patient ID: [name] [name], female   DOB: September 13, 1928, 83 y.o.   MRN: [id number]  HPI: [name] [name] is a 83 y.o.-year-old female, returning for f/u for DM2, dx 2005, insulin-dependent, uncontrolled, with complications (nephropathy). Last visit 3 mo ago. Ins.>> now [employer].  She was in the ED with bronchitis 08/2016.  Last hemoglobin A1c was: Lab Results  Component Value Date   HGBA1C 8.7 08/18/2016   HGBA1C 9.4 03/29/2016   HGBA1C 7.8 12/28/2015   HGBA1C 8.4 09/22/2015   HGBA1C 9.1 (H) 06/08/2015   HGBA1C 8.7 (H) 02/26/2015   HGBA1C 7.8 (H) 08/07/2014   HGBA1C 8.5 (H) 03/20/2014   HGBA1C 7.7 (H) 12/18/2013   HGBA1C 7.6 (H) 07/25/2013   Pt is on a regimen of:  - Amaryl 4 mg 2x a day, before meals.  - Lantus 40 units at bedtime - NovoLog - forgets doses less now  16-18 units with a smaller meal  22 units larger meal We had to stop Metformin 2/2 increased creatinine. We stopped Amaryl at last visit.  Pt checks her sugars 3x a day - forgot log: - am: low 200s >> 120-150 >> 131-168, 199 >> 129-150 - 2h after b'fast:  [phone] >> n/c >> 163-282 >> n/c - lunch: 167-189, 242 >> 75 >> n/c >>164-185, 254 >> n/c - 2h after lunch: 299 >> 161, 181 >> 120-150 >> n/c - before dinner: 127-168 >> 178 >> n/c >> 180, 192 >> n/c - later: high 200s >> 160-210 >> 156, 198-283, 317 >> <200, 302 x1 No lows. Lowest sugar was 120 >> 129. She has hypoglycemia awareness around 70.  Highest 200s >> 302 (forgot NovoLog with dinner).  Pt has CKD stage 3-4 - last BUN/Cr: Lab Results  Component Value Date   BUN 26 (H) 03/29/2016   CREATININE 1.46 (H) 03/29/2016   Last set of lipids: Lab Results  Component Value Date   CHOL 167 03/29/2016   HDL 49.70 03/29/2016   LDLDIRECT 99.0 03/29/2016   TRIG 224.0 (H) 03/29/2016   CHOLHDL 3 03/29/2016  On Zocor 20 mg daily. Pt's last eye exam was in 08/2015, with Dr. Susa Loffler at [facility]. No diabetic  retinopathy. She missed last appt >> scheduled for June. She denies numbness and tingling in her legs.  ROS: Constitutional: no weight gain/no weight loss, no fatigue, no subjective hyperthermia, no subjective hypothermia Eyes: no blurry vision, no xerophthalmia ENT: no sore throat, no nodules palpated in throat, no dysphagia, no odynophagia, no hoarseness Cardiovascular: no CP/no SOB/no palpitations/no leg swelling Respiratory: no cough/no SOB/no wheezing Gastrointestinal: no N/no V/no D/no C/no acid reflux Musculoskeletal: no muscle aches/no joint aches Skin: no rashes, no hair loss Neurological: no tremors/no numbness/no tingling/no dizziness  I reviewed pt's medications, allergies, PMH, social hx, family hx, and changes were documented in the history of present illness. Otherwise, unchanged from my initial visit note.  PE: BP 128/70 (BP Location: Left Arm, Patient Position: Sitting)   Pulse (!) 103   Ht 5\' 4"  (1.626 m)   Wt 176 lb (79.8 kg)   SpO2 96%   BMI 30.21 kg/m  Body mass index is 30.21 kg/m. Wt Readings from Last 3 Encounters:  11/15/16 176 lb (79.8 kg)  10/18/16 177 lb 8 oz (80.5 kg)  08/18/16 177 lb (80.3 kg)   Constitutional: overweight, in NAD Eyes: PERRLA, EOMI, no exophthalmos ENT: moist mucous membranes, no thyromegaly, no cervical lymphadenopathy Cardiovascular: RRR, + SEM 2/6, No R,G Respiratory: CTA B  Gastrointestinal: abdomen soft, NT, ND, BS+ Musculoskeletal: no deformities, strength intact in all 4 Skin: moist, warm, no rashes Neurological: no tremor with outstretched hands, DTR normal in all 4   ASSESSMENT: 1. DM2, insulin-dependent, uncontrolled, without complications - + nephropathy  We have tried to stop Amaryl, but sugars were higher >> we restarted it.  PLAN:  1. Patient with long-standing diabetes, with improved control at this visit. She did not consistently increase the Novolog doses as advised >> will again advise her to increase,  especially when eating out. - I advised her to:   Patient Instructions  Please continue:  - Amaryl 4 mg 2x a day, before meals.  - Lantus 40 units at bedtime  Please increase: - NovoLog   16-18 units with a smaller meal  20-22 units with a regular meal  24-26 units for a larger meal  Please return in 3 months with your sugar log.  - today, HbA1c is 8.4% (better)  - continue checking sugars at different times of the day - check 3x a day, rotating checks >> bring log at next visit - advised for yearly eye exams >> she needs one  - scheduled. - advised for flu shot >> she is UTD - Return to clinic in 3 mo with sugar log   Philemon Kingdom, MD PhD [facility]

## 2016-11-15 NOTE — Patient Instructions (Addendum)
Please continue:  - Amaryl 4 mg 2x a day, before meals.  - Lantus 40 units at bedtime  Please increase: - NovoLog   16-18 units with a smaller meal  20-22 units with a regular meal  24-26 units for a larger meal  Please return in 3 months with your sugar log.

## 2016-11-15 NOTE — Addendum Note (Signed)
Addended by: Caprice Beaver T on: 11/15/2016 03:14 PM   Modules accepted: Orders

## 2016-11-18 DIAGNOSIS — R69 Illness, unspecified: Secondary | ICD-10-CM | POA: Diagnosis not present

## 2016-11-28 ENCOUNTER — Other Ambulatory Visit: Payer: Self-pay | Admitting: Internal Medicine

## 2016-11-29 ENCOUNTER — Other Ambulatory Visit: Payer: Self-pay | Admitting: Internal Medicine

## 2016-12-07 DIAGNOSIS — Z961 Presence of intraocular lens: Secondary | ICD-10-CM | POA: Diagnosis not present

## 2016-12-07 DIAGNOSIS — E119 Type 2 diabetes mellitus without complications: Secondary | ICD-10-CM | POA: Diagnosis not present

## 2016-12-07 LAB — HM DIABETES EYE EXAM

## 2016-12-17 ENCOUNTER — Telehealth: Payer: Self-pay | Admitting: Internal Medicine

## 2016-12-17 MED ORDER — INSULIN GLARGINE 100 UNIT/ML SOLOSTAR PEN: PEN_INJECTOR | SUBCUTANEOUS | 0 refills | Status: DC

## 2016-12-17 NOTE — Telephone Encounter (Signed)
Spoke to pt, told her Rx for Insulin was sent to pharmacy as requested. Dr. Yong Channel authorized refill. Pt verbalized understanding.

## 2016-12-17 NOTE — Telephone Encounter (Signed)
Relation to JG:OTLX Call back number: [phone] Pharmacy: [address], [place], [place] [phone]   Reason for call:  Patient is currently on vacation and forgot her Insulin Glargine (LANTUS SOLOSTAR) 100 UNIT/ML Solostar Pen, patient last dose Thursday night, patient concerned about missing 3 days (Friday, Saturday, Sunday night) requesting a 2 day supply, please advise

## 2017-01-23 ENCOUNTER — Other Ambulatory Visit: Payer: Self-pay | Admitting: Internal Medicine

## 2017-02-20 ENCOUNTER — Other Ambulatory Visit: Payer: Self-pay | Admitting: Internal Medicine

## 2017-02-24 ENCOUNTER — Encounter: Payer: Self-pay | Admitting: Internal Medicine

## 2017-02-24 ENCOUNTER — Ambulatory Visit (INDEPENDENT_AMBULATORY_CARE_PROVIDER_SITE_OTHER): Payer: Medicare HMO | Admitting: Internal Medicine

## 2017-02-24 VITALS — BP 124/82 | HR 87 | Ht 64.5 in | Wt 179.0 lb

## 2017-02-24 DIAGNOSIS — E7800 Pure hypercholesterolemia, unspecified: Secondary | ICD-10-CM

## 2017-02-24 DIAGNOSIS — E1121 Type 2 diabetes mellitus with diabetic nephropathy: Secondary | ICD-10-CM | POA: Diagnosis not present

## 2017-02-24 DIAGNOSIS — Z794 Long term (current) use of insulin: Secondary | ICD-10-CM | POA: Diagnosis not present

## 2017-02-24 LAB — POCT GLYCOSYLATED HEMOGLOBIN (HGB A1C): Hemoglobin A1C: 8.3

## 2017-02-24 MED ORDER — GLIMEPIRIDE 4 MG PO TABS: 4.0000 mg | ORAL_TABLET | Freq: Two times a day (BID) | ORAL | 3 refills | Status: DC

## 2017-02-24 MED ORDER — INSULIN GLARGINE 100 UNIT/ML SOLOSTAR PEN: PEN_INJECTOR | SUBCUTANEOUS | 3 refills | Status: DC

## 2017-02-24 MED ORDER — INSULIN ASPART 100 UNIT/ML FLEXPEN: 16.0000 [IU] | PEN_INJECTOR | Freq: Three times a day (TID) | SUBCUTANEOUS | 3 refills | Status: DC

## 2017-02-24 NOTE — Progress Notes (Signed)
Patient ID: [name] [name], female   DOB: Jan 06, 1929, 83 y.o.   MRN: [id number]  HPI: [name] [name] is a 83 y.o.-year-old female, returning for f/u for DM2, dx 2005, insulin-dependent, uncontrolled, with complications (nephropathy). Last visit 3 mo ago. Ins.>> now [employer].  Last hemoglobin A1c was: Lab Results  Component Value Date   HGBA1C 8.4 11/15/2016   HGBA1C 8.7 08/18/2016   HGBA1C 9.4 03/29/2016   HGBA1C 7.8 12/28/2015   HGBA1C 8.4 09/22/2015   HGBA1C 9.1 (H) 06/08/2015   HGBA1C 8.7 (H) 02/26/2015   HGBA1C 7.8 (H) 08/07/2014   HGBA1C 8.5 (H) 03/20/2014   HGBA1C 7.7 (H) 12/18/2013   Pt is on a regimen of:  - Amaryl 4 mg 2x a day, before meals  - Lantus 40 units at bedtime - still forgets doses - NovoLog - still forgets many doses - esp. With dinner  16-18 units with a smaller meal  20-22 units with a regular meal  24-26 units for a larger meal We had to stop Metformin 2/2 increased creatinine. We stopped Amaryl at last visit.  Pt checks her sugars 3x a day: - am: low 200s >> 120-150 >> 131-168, 199 >> 129-150 >> 63, 83-160 (higher if forgets Lantus) - 2h after b'fast:  [phone] >> n/c >> 163-282 >> n/c >> 98-185 - lunch: 167-189, 242 >> 75 >> n/c >>164-185, 254 >> n/c >> 142-190 - 2h after lunch: 299 >> 161, 181 >> 120-150 >> n/c  - before dinner: 127-168 >> 178 >> n/c >> 180, 192 >> n/c >> 217 - later: high 200s >> 160-210 >> 156, 198-283, 317 >> <200, 302 x1 >> 99, 115-275, 299 No lows. Lowest sugar was 120 >> 129 >> 63. She has hypoglycemia awareness around 70.  Highest 200s >> 302 (forgot NovoLog with dinner) >> 299  + CKD stage 3-4- last BUN/Cr: Lab Results  Component Value Date   BUN 26 (H) 03/29/2016   CREATININE 1.46 (H) 03/29/2016   Last set of lipids: Lab Results  Component Value Date   CHOL 167 03/29/2016   HDL 49.70 03/29/2016   LDLDIRECT 99.0 03/29/2016   TRIG 224.0 (H) 03/29/2016   CHOLHDL 3 03/29/2016  On Zocor 20 mg daily. Pt's  last eye exam was in 12/2016 >> No DR.-  Dr. Susa Loffler at [facility].  She denies numbness and tingling in her legs.  ROS: Constitutional: no weight gain/no weight loss, no fatigue, no subjective hyperthermia, no subjective hypothermia Eyes: no blurry vision, no xerophthalmia ENT: no sore throat, no nodules palpated in throat, no dysphagia, no odynophagia, no hoarseness Cardiovascular: no CP/no SOB/no palpitations/no leg swelling Respiratory: no cough/no SOB/no wheezing Gastrointestinal: no N/no V/no D/no C/no acid reflux Musculoskeletal: no muscle aches/no joint aches Skin: no rashes, no hair loss Neurological: no tremors/no numbness/no tingling/no dizziness  I reviewed pt's medications, allergies, PMH, social hx, family hx, and changes were documented in the history of present illness. Otherwise, unchanged from my initial visit note.  PE: BP 124/82 (BP Location: Left Arm, Patient Position: Sitting)   Pulse 87   Ht 5' 4.5" (1.638 m)   Wt 179 lb (81.2 kg)   SpO2 93%   BMI 30.25 kg/m  Body mass index is 30.25 kg/m. Wt Readings from Last 3 Encounters:  02/24/17 179 lb (81.2 kg)  11/15/16 176 lb (79.8 kg)  10/18/16 177 lb 8 oz (80.5 kg)   Constitutional: overweight, in NAD Eyes: PERRLA, EOMI, no exophthalmos ENT: moist mucous  membranes, no thyromegaly, no cervical lymphadenopathy Cardiovascular: RRR, No RG, +2/6 SEM Respiratory: CTA B Gastrointestinal: abdomen soft, NT, ND, BS+ Musculoskeletal: no deformities, strength intact in all 4 Skin: moist, warm, no rashes Neurological: no tremor with outstretched hands, DTR normal in all 4  ASSESSMENT: 1. DM2, insulin-dependent, uncontrolled, without complications - + nephropathy  We have tried to stop Amaryl, but sugars were higher >> we restarted it.  2. HL  PLAN:  1. Patient with long-standing diabetes, Uncontrolled, with still high sugars especially towards the end of the day, due to  forgetting insulin doses. Sugars at bedtime are mostly in the 200s, but occasionally, approximately 2-3 times a week, they are at goal. She tells me that sugars are in the 200s usually when she forgets to take her NovoLog with dinner. Therefore, I cannot change her regimen for now, but I strongly advised her to write down on her log whether she took the insulin dose with dinner or not.  - I advised her to:   Patient Instructions  Please continue:  - Amaryl 4 mg 2x a day, before meals.  - Lantus 40 units at bedtime - NovoLog   16-18 units with a smaller meal  20-22 units with a regular meal  24-26 units for a larger meal  Please return in 3 months with your sugar log  - today, HbA1c is 8.3% (stable, high) - continue checking sugars at different times of the day - check 3x a day, rotating checks - advised for yearly eye exams >> she is UTD - Return to clinic in 3 mo with sugar log   2. HL - reviewed latest lipid panel >> LDL at goal, TG elevated - continues Zocor  Philemon Kingdom, MD PhD [facility]

## 2017-02-24 NOTE — Patient Instructions (Signed)
Please continue:  - Amaryl 4 mg 2x a day, before meals.  - Lantus 40 units at bedtime - NovoLog   16-18 units with a smaller meal  20-22 units with a regular meal  24-26 units for a larger meal  Please return in 3 months with your sugar log

## 2017-02-24 NOTE — Addendum Note (Signed)
Addended by: Caprice Beaver T on: 02/24/2017 03:31 PM   Modules accepted: Orders

## 2017-03-31 ENCOUNTER — Other Ambulatory Visit: Payer: Self-pay | Admitting: Internal Medicine

## 2017-03-31 DIAGNOSIS — R69 Illness, unspecified: Secondary | ICD-10-CM | POA: Diagnosis not present

## 2017-04-28 ENCOUNTER — Other Ambulatory Visit: Payer: Self-pay | Admitting: Internal Medicine

## 2017-05-03 ENCOUNTER — Telehealth: Payer: Self-pay

## 2017-05-03 NOTE — Telephone Encounter (Signed)
Call to schedule AWV Agreed to schedule with Dr. Raliegh Ip after 11/13 and wlil schedule AWV before or after her apt Lenoria Chime RN

## 2017-05-18 ENCOUNTER — Encounter: Payer: Medicare HMO | Admitting: Internal Medicine

## 2017-05-23 ENCOUNTER — Other Ambulatory Visit: Payer: Self-pay | Admitting: Internal Medicine

## 2017-06-01 ENCOUNTER — Ambulatory Visit (INDEPENDENT_AMBULATORY_CARE_PROVIDER_SITE_OTHER): Payer: Medicare HMO | Admitting: Internal Medicine

## 2017-06-01 ENCOUNTER — Encounter: Payer: Self-pay | Admitting: Internal Medicine

## 2017-06-01 VITALS — BP 132/78 | HR 88 | Temp 98.5°F | Ht 64.5 in | Wt 177.2 lb

## 2017-06-01 DIAGNOSIS — M159 Polyosteoarthritis, unspecified: Secondary | ICD-10-CM

## 2017-06-01 DIAGNOSIS — M150 Primary generalized (osteo)arthritis: Secondary | ICD-10-CM | POA: Diagnosis not present

## 2017-06-01 DIAGNOSIS — I10 Essential (primary) hypertension: Secondary | ICD-10-CM

## 2017-06-01 DIAGNOSIS — M8949 Other hypertrophic osteoarthropathy, multiple sites: Secondary | ICD-10-CM

## 2017-06-01 DIAGNOSIS — Z0000 Encounter for general adult medical examination without abnormal findings: Secondary | ICD-10-CM | POA: Diagnosis not present

## 2017-06-01 DIAGNOSIS — E7800 Pure hypercholesterolemia, unspecified: Secondary | ICD-10-CM | POA: Diagnosis not present

## 2017-06-01 DIAGNOSIS — E1121 Type 2 diabetes mellitus with diabetic nephropathy: Secondary | ICD-10-CM | POA: Diagnosis not present

## 2017-06-01 MED ORDER — LISINOPRIL-HYDROCHLOROTHIAZIDE 10-12.5 MG PO TABS: 1.0000 | ORAL_TABLET | Freq: Every day | ORAL | 3 refills | Status: DC

## 2017-06-01 NOTE — Patient Instructions (Addendum)
Limit your sodium (Salt) intake   Please check your hemoglobin A1c every 3 months    Follow-up with endocrinology  Please see your eye doctor yearly to check for diabetic eye damage  Lactose-Free Diet, Adult If you have lactose intolerance, you are not able to digest lactose. Lactose is a natural sugar found mainly in milk and milk products. You may need to avoid all foods and beverages that contain lactose. A lactose-free diet can help you do this. What do I need to know about this diet?  Do not consume foods, beverages, vitamins, minerals, or medicines with lactose. Read ingredients lists carefully.  Look for the words "lactose-free" on labels.  Use lactase enzyme drops or tablets as directed by your health care provider.  Use lactose-free milk or a milk alternative, such as soy milk, for drinking and cooking.  Make sure you get enough calcium and vitamin D in your diet. A lactose-free eating plan can be lacking in these important nutrients.  Take calcium and vitamin D supplements as directed by your health care provider. Talk to your provider about supplements if you are not able to get enough calcium and vitamin D from food. Which foods have lactose? Lactose is found in:  Milk and foods made from milk.  Yogurt.  Cheese.  Butter.  Margarine.  Sour cream.  Cream.  Whipped toppings and nondairy creamers.  Ice cream and other milk-based desserts.  Lactose is also found in foods or products made with milk or milk ingredients. To find out whether a food contains milk or a milk ingredient, look at the ingredients list. Avoid foods with the statement "May contain milk" and foods that contain:  Butter.  Cream.  Milk.  Milk solids.  Milk powder.  Whey.  Curd.  Caseinate.  Lactose.  Lactalbumin.  Lactoglobulin.  What are some alternatives to milk and foods made with milk products?  Lactose-free milk.  Soy milk with added calcium and vitamin  D.  Almond, coconut, or rice milk with added calcium and vitamin D. Note that these are low in protein.  Soy products, such as soy yogurt, soy cheese, soy ice cream, and soy-based sour cream. Which foods can I eat? Grains Breads and rolls made without milk, such as [place], [place], or [place] bread, bagels, pita, and [employer]. Corn tortillas, corn meal, grits, and polenta. Crackers without lactose or milk solids, such as soda crackers and graham crackers. Cooked or dry cereals without lactose or milk solids. Pasta, quinoa, couscous, barley, oats, bulgur, farro, rice, wild rice, or other grains prepared without milk or lactose. Plain popcorn. Vegetables Fresh, frozen, and canned vegetables without cheese, cream, or butter sauces. Fruits All fresh, canned, frozen, or dried fruits that are not processed with lactose. Meats and Other Protein Sources Plain beef, chicken, fish, [place], lamb, veal, pork, wild game, or ham. Kosher-prepared meat products. Strained or junior meats that do not contain milk. Eggs. Soy meat substitutes. Beans, lentils, and hummus. Tofu. Nuts and seeds. Peanut or other nut butters without lactose. Soups, casseroles, and mixed dishes without cheese, cream, or milk. Dairy Lactose-free milk. Soy, rice, or almond milk with added calcium and vitamin D. Soy cheese and yogurt. Beverages Carbonated drinks. Tea. Coffee, freeze-dried coffee, and some instant coffees. Fruit and vegetable juices. Condiments Soy sauce. Carob powder. Olives. Gravy made with water. Baker's cocoa. Angie Fava. Pure seasonings and spices. Ketchup. Mustard. Bouillon. Broth. Sweets and Desserts Water and fruit ices. Gelatin. Cookies, pies, or cakes made from allowed ingredients, such  as angel food cake. Pudding made with water or a milk substitute. Lactose-free tofu desserts. Soy, coconut milk, or rice-milk-based frozen desserts. Sugar. Honey. Jam, jelly, and marmalade. Molasses. Pure sugar candy. Dark chocolate  without milk. Marshmallows. Fats and Oils Margarines and salad dressings that do not contain milk. Berniece Salines. Vegetable oils. Shortening. Mayonnaise. Soy or coconut-based cream. The items listed above may not be a complete list of recommended foods or beverages. Contact your dietitian for more options. Which foods are not recommended? Grains Breads and rolls that contain milk. Toaster pastries. Muffins, biscuits, waffles, cornbread, and pancakes. These can be prepared at home, commercial, or from mixes. Sweet rolls, donuts, English muffins, fry bread, lefse, flour tortillas with lactose, or [place] toast made with milk or milk ingredients. Crackers that contain lactose. Corn curls. Cooked or dry cereals with lactose. Vegetables Creamed or breaded vegetables. Vegetables in a cheese or butter sauce or with lactose-containing margarines. Instant potatoes. [place] fries. Scalloped or au gratin potatoes. Fruits None. Meats and Other Protein Sources Scrambled eggs, omelets, and souffles that contain milk. Creamed or breaded meat, fish, chicken, or [place]. Sausage products, such as wieners and liver sausage. Cold cuts that contain milk solids. Cheese, cottage cheese, ricotta cheese, and cheese spreads. Lasagna and macaroni and cheese. Pizza. Peanut or other nut butters with added milk solids. Casseroles or mixed dishes containing milk or cheese. Dairy All dairy products, including milk, goat's milk, buttermilk, kefir, acidophilus milk, flavored milk, evaporated milk, condensed milk, dulce de [place], eggnog, yogurt, cheese, and cheese spreads. Beverages Hot chocolate. Cocoa with lactose. Instant iced teas. Powdered fruit drinks. Smoothies made with milk or yogurt. Condiments Chewing gum that has lactose. Cocoa that has lactose. Spice blends if they contain milk products. Artificial sweeteners that contain lactose. Nondairy creamers. Sweets and Desserts Ice cream, ice milk, gelato, sherbet, and frozen yogurt.  Custard, pudding, and mousse. Cake, cream pies, cookies, and other desserts containing milk, cream, cream cheese, or milk chocolate. Pie crust made with milk-containing margarine or butter. Reduced-calorie desserts made with a sugar substitute that contains lactose. Toffee and butterscotch. Milk, white, or dark chocolate that contains milk. Fudge. Caramel. Fats and Oils Margarines and salad dressings that contain milk or cheese. Cream. Half and half. Cream cheese. Sour cream. Chip dips made with sour cream or yogurt. The items listed above may not be a complete list of foods and beverages to avoid. Contact your dietitian for more information. Am I getting enough calcium? Calcium is found in many foods that contain lactose and is important for bone health. The amount of calcium you need depends on your age:  Adults younger than 50 years: 1000 mg of calcium a day.  Adults older than 50 years: 1200 mg of calcium a day.  If you are not getting enough calcium, other calcium sources include:  Orange juice with calcium added. There are 300-350 mg of calcium in 1 cup of orange juice.  Sardines with edible bones. There are 325 mg of calcium in 3 oz of sardines.  Calcium-fortified soy milk. There are 300-400 mg of calcium in 1 cup of calcium-fortified soy milk.  Calcium-fortified rice or almond milk. There are 300 mg of calcium in 1 cup of calcium-fortified rice or almond milk.  Canned salmon with edible bones. There are 180 mg of calcium in 3 oz of canned salmon with edible bones.  Calcium-fortified breakfast cereals. There are [phone] mg of calcium in calcium-fortified breakfast cereals.  Tofu set with calcium sulfate. There are 250  mg of calcium in  cup of tofu set with calcium sulfate.  Spinach, cooked. There are 145 mg of calcium in  cup of cooked spinach.  Edamame, cooked. There are 130 mg of calcium in  cup of cooked edamame.  Collard greens, cooked. There are 125 mg of calcium in   cup of cooked collard greens.  Kale, frozen or cooked. There are 90 mg of calcium in  cup of cooked or frozen kale.  Almonds. There are 95 mg of calcium in  cup of almonds.  Broccoli, cooked. There are 60 mg of calcium in 1 cup of cooked broccoli.  This information is not intended to replace advice given to you by your health care provider. Make sure you discuss any questions you have with your health care provider. Document Released: 12/10/2001 Document Revised: 11/26/2015 Document Reviewed: 09/20/2013 Elsevier Interactive Patient Education  2018 [employer].

## 2017-06-01 NOTE — Progress Notes (Signed)
Subjective:    Patient ID: [name] [name], female    DOB: October 20, 1928, 83 y.o.   MRN: [id number]  HPI  83 year old patient who is seen today for a health maintenance exam and subsequent Medicare wellness visit. She has diabetes and has been followed by endocrinology. Doing quite well without any major concerns.  She does have chronic low back pain and intermittent diarrhea which have been chronic issues.  She has osteoarthritis and treated hypertension.  Past Medical History:  Diagnosis Date  . Asthma   . DEGENERATIVE JOINT DISEASE, GENERALIZED 02/01/2007   Qualifier: Diagnosis of  By: Burnice Logan  MD, Doretha Sou   . DIARRHEA, CHRONIC 08/20/2009   Qualifier: Diagnosis of  By: Burnice Logan  MD, Doretha Sou   . DM 02/01/2007   Qualifier: Diagnosis of  By: Floyde Parkins    . FATIGUE 10/20/2008   Qualifier: Diagnosis of  By: Burnice Logan  MD, Doretha Sou   . HYPERCHOLESTEROLEMIA 02/01/2007   Qualifier: Diagnosis of  By: Floyde Parkins    . HYPERLIPIDEMIA 12/20/2007   Qualifier: Diagnosis of  By: Burnice Logan  MD, Doretha Sou   . HYPERTENSION 02/01/2007   Qualifier: Diagnosis of  By: Floyde Parkins    . LOW BACK PAIN 01/13/2010   Qualifier: Diagnosis of  By: Burnice Logan  MD, Doretha Sou MUSCLE CRAMPS 04/06/2010   Qualifier: Diagnosis of  By: Burnice Logan  MD, Doretha Sou   . MYALGIA 01/20/2010   Qualifier: Diagnosis of  By: Elease Hashimoto MD, Bruce    . OSTEOARTHRITIS 01/13/2010   Qualifier: Diagnosis of  By: Burnice Logan  MD, Doretha Sou   . OVERACTIVE BLADDER 08/20/2009   Qualifier: Diagnosis of  By: Burnice Logan  MD, Doretha Sou   . PLEURISY 06/18/2008   Qualifier: Diagnosis of  By: Niel Hummer MD, [place] PNEUMONIA 06/18/2008   Qualifier: Diagnosis of  By: Niel Hummer MD, Rabun Rectal prolapse 09/20/2007   Qualifier: Diagnosis of  By: Burnice Logan  MD, Doretha Sou   . VITAMIN B12 DEFICIENCY 07/20/2010   Qualifier: Diagnosis of  By: Carlean Purl MD, Dimas Millin      Social History   Socioeconomic History  . Marital  status: Single    Spouse name: Not on file  . Number of children: Not on file  . Years of education: Not on file  . Highest education level: Not on file  Social Needs  . Financial resource strain: Not on file  . Food insecurity - worry: Not on file  . Food insecurity - inability: Not on file  . Transportation needs - medical: Not on file  . Transportation needs - non-medical: Not on file  Occupational History  . Not on file  Tobacco Use  . Smoking status: Former Smoker    Packs/day: 10.00  . Smokeless tobacco: Never Used  . Tobacco comment: quit > 55 yo   Substance and Sexual Activity  . Alcohol use: No  . Drug use: No  . Sexual activity: Not on file  Other Topics Concern  . Not on file  Social History Narrative  . Not on file    Past Surgical History:  Procedure Laterality Date  . ABDOMINAL HYSTERECTOMY     partial- states she has her ovaries  . APPENDECTOMY    . BLADDER SURGERY    . CATARACT EXTRACTION Bilateral 2012  . CHOLECYSTECTOMY      Family History  Problem Relation Age of Onset  . Deep vein  thrombosis Mother   . COPD Father   . Asthma Father   . Diabetes Neg Hx        family hx  . Cancer Neg Hx        breast ca - sister(S)?  Marland Kitchen Heart disease Neg Hx        family hx    Allergies  Allergen Reactions  . Metformin     REACTION: diarrhea  . Sitagliptin Phosphate     REACTION: abdominal pain    Current Outpatient Medications on File Prior to Visit  Medication Sig Dispense Refill  . Acetaminophen 650 MG TABS Take 1 tablet (650 mg total) by mouth 3 (three) times daily as needed. 30 tablet 0  . ADVOCATE REDI-CODE test strip   99  . aspirin 81 MG tablet Take 81 mg by mouth daily.      . Blood Glucose Calibration (ACCU-CHEK AVIVA) SOLN     . Blood Glucose Calibration (ADVOCATE REDI-CODE+ CONTROL) LOW SOLN     . calcium-vitamin D (OSCAL WITH D 500-200) 500-200 MG-UNIT per tablet Take 1 tablet by mouth daily.      . cyanocobalamin 1000 MCG tablet Take  100 mcg by mouth daily.      . diphenoxylate-atropine (LOMOTIL) 2.5-0.025 MG tablet Take 1 tablet by mouth 4 (four) times daily as needed for diarrhea or loose stools. Take 2 tablets initially then take 1 tablet every 4 hours as needed for diarrhea. 30 tablet 0  . fluticasone (FLONASE) 50 MCG/ACT nasal spray Place 1 spray into the nose daily. 16 g 0  . glimepiride (AMARYL) 4 MG tablet Take 1 tablet (4 mg total) by mouth 2 (two) times daily before a meal. 180 tablet 3  . glimepiride (AMARYL) 4 MG tablet TAKE 1 TABLET BY MOUTH TWICE DAILY 180 tablet 0  . ibuprofen (ADVIL,MOTRIN) 600 MG tablet TAKE 1 TABLET BY MOUTH EVERY 6 HOURS AS NEEDED FOR PAIN 60 tablet 1  . insulin aspart (NOVOLOG FLEXPEN) 100 UNIT/ML FlexPen Inject 16-26 Units into the skin 3 (three) times daily with meals. 45 mL 3  . Insulin Glargine (LANTUS SOLOSTAR) 100 UNIT/ML Solostar Pen INJECT 40 UNITS SUBCUTANEOUSLY ONCE DAILY AT  10PM 15 pen 3  . Insulin Pen Needle (BD PEN NEEDLE NANO U/F) 32G X 4 MM MISC USE TO INJECT INSULIN 4 TIMES DAILY AS DIRECTED. 130 each 11  . Multiple Vitamin (MULTIVITAMIN) tablet Take 1 tablet by mouth daily.      Marland Kitchen PHARMACIST CHOICE LANCETS MISC     . simvastatin (ZOCOR) 40 MG tablet TAKE ONE-HALF TABLET BY MOUTH AT BEDTIME 45 tablet 1  . SURE COMFORT PEN NEEDLES 31G X 8 MM MISC     . traZODone (DESYREL) 150 MG tablet TAKE ONE TABLET BY MOUTH ONCE DAILY AT BEDTIME AS NEEDED 90 tablet 3  . venlafaxine XR (EFFEXOR-XR) 75 MG 24 hr capsule TAKE 1 CAPSULE BY MOUTH ONCE DAILY 90 capsule 1   No current facility-administered medications on file prior to visit.     BP 132/78 (BP Location: Left Arm, Patient Position: Sitting, Cuff Size: Normal)   Pulse 88   Temp 98.5 F (36.9 C) (Oral)   Ht 5' 4.5" (1.638 m)   Wt 177 lb 3.2 oz (80.4 kg)   SpO2 96%   BMI 29.95 kg/m   Subsequent Medicare wellness visit  1. Risk factors, based on past  M,S,F history.  Cardiovascular risk factors include age remote tobacco  use hypertension and type 2 diabetes.  She is on statin therapy for dyslipidemia  2.  Physical activities: Does fairly well for age considering chronic low back pain and arthritis.  He does exercise regularly at a senior center at least twice weekly  3.  Depression/mood: No history of major depression or mood disorder  4.  Hearing: Only modest deficits  5.  ADL's: Independent  6.  Fall risk: Moderate due to age weight and arthritis  7.  Home safety: No problems identified.  No recent falls  8.  Height weight, and visual acuity; height and weight stable no change in visual acuity is seen by ophthalmology at least annually  9.  Counseling: Modest weight loss encouraged.  Follow-up endocrinology  10. Lab orders based on risk factors: Laboratory update will be reviewed including lipid panel hemoglobin A1c and urine for microalbumin  11. Referral : Follow-up endocrinology  12. Care plan: Continue efforts at aggressive risk factor modification  13. Cognitive assessment: Alert and oriented with normal affect no cognitive dysfunction  14. Screening: Patient provided with a written and personalized 5-10 year screening schedule in the AVS.    15. Provider List Update: Primary care ophthalmology and endocrinology   Review of Systems  Constitutional: Negative.   HENT: Negative for congestion, dental problem, hearing loss, rhinorrhea, sinus pressure, sore throat and tinnitus.   Eyes: Negative for pain, discharge and visual disturbance.  Respiratory: Negative for cough and shortness of breath.   Cardiovascular: Negative for chest pain, palpitations and leg swelling.  Gastrointestinal: Positive for diarrhea. Negative for abdominal distention, abdominal pain, blood in stool, constipation, nausea and vomiting.  Genitourinary: Negative for difficulty urinating, dysuria, flank pain, frequency, hematuria, pelvic pain, urgency, vaginal bleeding, vaginal discharge and vaginal pain.  Musculoskeletal:  Positive for arthralgias and back pain. Negative for gait problem and joint swelling.  Skin: Negative for rash.  Neurological: Negative for dizziness, syncope, speech difficulty, weakness, numbness and headaches.  Hematological: Negative for adenopathy.  Psychiatric/Behavioral: Negative for agitation, behavioral problems and dysphoric mood. The patient is not nervous/anxious.        Objective:   Physical Exam  Constitutional: She is oriented to person, place, and time. She appears well-developed and well-nourished.  HENT:  Head: Normocephalic and atraumatic.  Right Ear: External ear normal.  Left Ear: External ear normal.  Mouth/Throat: Oropharynx is clear and moist.  Eyes: Conjunctivae and EOM are normal.  Neck: Normal range of motion. Neck supple. No JVD present. No thyromegaly present.  Cardiovascular: Normal rate, regular rhythm, normal heart sounds and intact distal pulses.  No murmur heard. Pulmonary/Chest: Effort normal and breath sounds normal. She has no wheezes. She has no rales.  Abdominal: Soft. Bowel sounds are normal. She exhibits no distension and no mass. There is no tenderness. There is no rebound and no guarding.  Musculoskeletal: Normal range of motion. She exhibits no edema or tenderness.  Neurological: She is alert and oriented to person, place, and time. She has normal reflexes. No cranial nerve deficit. She exhibits normal muscle tone. Coordination normal.  Skin: Skin is warm and dry. No rash noted.  Psychiatric: She has a normal mood and affect. Her behavior is normal.          Assessment & Plan:  Preventive health examination  Subsequent Medicare wellness visit Diabetes mellitus.  Will review a hemoglobin A1c urine for microalbumin Dyslipidemia review a lipid panel Essential hypertension well-controlled.  Patient has some concerns about losartan and cancer risk.  Patient has had no issues with ACE inhibition.  We will switch to lisinopril Chronic low  back pain Chronic diarrhea.  Patient will try a lactose-free diet  Follow with endocrinology Return here in 1 year or as needed  Nyoka Cowden

## 2017-06-02 LAB — COMPREHENSIVE METABOLIC PANEL
ALK PHOS: 46 U/L (ref 39–117)
ALT: 32 U/L (ref 0–35)
AST: 46 U/L — ABNORMAL HIGH (ref 0–37)
Albumin: 4 g/dL (ref 3.5–5.2)
BUN: 26 mg/dL — ABNORMAL HIGH (ref 6–23)
CO2: 29 mEq/L (ref 19–32)
Calcium: 10 mg/dL (ref 8.4–10.5)
Chloride: 100 mEq/L (ref 96–112)
Creatinine, Ser: 1.5 mg/dL — ABNORMAL HIGH (ref 0.40–1.20)
GFR: 35 mL/min — AB (ref >60.00)
GLUCOSE: 264 mg/dL — AB (ref 70–99)
POTASSIUM: 4.9 meq/L (ref 3.5–5.1)
Sodium: 137 mEq/L (ref 135–145)
TOTAL PROTEIN: 7.5 g/dL (ref 6.0–8.3)
Total Bilirubin: 0.4 mg/dL (ref 0.2–1.2)

## 2017-06-02 LAB — LIPID PANEL
Cholesterol: 203 mg/dL — ABNORMAL HIGH (ref 0–200)
HDL: 48.4 mg/dL
NonHDL: 155
Total CHOL/HDL Ratio: 4
Triglycerides: 336 mg/dL — ABNORMAL HIGH (ref 0.0–149.0)
VLDL: 67.2 mg/dL — ABNORMAL HIGH (ref 0.0–40.0)

## 2017-06-02 LAB — CBC WITH DIFFERENTIAL/PLATELET
Basophils Absolute: 0.1 10*3/uL (ref 0.0–0.1)
Basophils Relative: 1.1 % (ref 0.0–3.0)
Eosinophils Absolute: 0.2 10*3/uL (ref 0.0–0.7)
Eosinophils Relative: 3.1 % (ref 0.0–5.0)
HCT: 37.8 % (ref 36.0–46.0)
Hemoglobin: 12.4 g/dL (ref 12.0–15.0)
Lymphocytes Relative: 37.3 % (ref 12.0–46.0)
Lymphs Abs: 2.9 10*3/uL (ref 0.7–4.0)
MCHC: 32.7 g/dL (ref 30.0–36.0)
MCV: 97.7 fl (ref 78.0–100.0)
Monocytes Absolute: 0.5 10*3/uL (ref 0.1–1.0)
Monocytes Relative: 6.7 % (ref 3.0–12.0)
Neutro Abs: 4 10*3/uL (ref 1.4–7.7)
Neutrophils Relative %: 51.8 % (ref 43.0–77.0)
Platelets: 261 10*3/uL (ref 150.0–400.0)
RBC: 3.87 Mil/uL (ref 3.87–5.11)
RDW: 13.1 % (ref 11.5–15.5)
WBC: 7.8 10*3/uL (ref 4.0–10.5)

## 2017-06-02 LAB — MICROALBUMIN / CREATININE URINE RATIO
Creatinine,U: 87.8 mg/dL
Microalb Creat Ratio: 43.7 mg/g — ABNORMAL HIGH (ref 0.0–30.0)
Microalb, Ur: 38.4 mg/dL — ABNORMAL HIGH (ref 0.0–1.9)

## 2017-06-02 LAB — LDL CHOLESTEROL, DIRECT: LDL DIRECT: 118 mg/dL

## 2017-06-02 LAB — TSH: TSH: 0.67 u[IU]/mL (ref 0.35–4.50)

## 2017-06-15 ENCOUNTER — Telehealth: Payer: Self-pay | Admitting: Family Medicine

## 2017-06-15 NOTE — Telephone Encounter (Signed)
Copied from [place]. Topic: General - Other >> Jun 15, 2017  2:13 PM Marin Olp L wrote: Reason for CRM: Patient has bad cough ever since starting Lisinopril on 11/29. Wants to know if that's a side affect or if Dr. Raliegh Ip can call something into her [place] ([place] [place] [address] [place]? Please call back to advise. Patient also wants a copy of her lab results from 11/29.

## 2017-06-15 NOTE — Telephone Encounter (Signed)
Discontinue lisinopril/hydrochlorothiazide Please call in a new prescription for hydrochlorothiazide 25 mg #90 1  daily

## 2017-06-15 NOTE — Telephone Encounter (Signed)
See message below, please advise.

## 2017-06-19 ENCOUNTER — Encounter: Payer: Self-pay | Admitting: Internal Medicine

## 2017-06-19 ENCOUNTER — Other Ambulatory Visit: Payer: Self-pay | Admitting: Internal Medicine

## 2017-06-19 MED ORDER — HYDROCHLOROTHIAZIDE 25 MG PO TABS: 25.0000 mg | ORAL_TABLET | Freq: Every day | ORAL | 3 refills | Status: DC

## 2017-06-19 NOTE — Telephone Encounter (Signed)
Discontinued lisinopril/hydrochlorothiazide & called in a new prescription for hydrochlorothiazide 25 mg #90 1 daily to pt's pharmacy.    A detailed message was left on pt's voicemail and a letter was mailed to pt's home address to ensure she received message.

## 2017-06-29 ENCOUNTER — Telehealth: Payer: Self-pay | Admitting: Internal Medicine

## 2017-06-29 NOTE — Telephone Encounter (Signed)
Copied from [place] [phone]. Topic: Quick Communication - See Telephone Encounter >> Jun 29, 2017  4:40 PM Bea Graff, NT wrote: CRM for notification. See Telephone encounter for: Pt would like a refill of  HYDROcodone-homatropine (HYCODAN) 5-1.5 MG/5ML syrup  for a cough she has. Uses [facility] on [address] 06/29/17.

## 2017-07-05 ENCOUNTER — Other Ambulatory Visit: Payer: Self-pay | Admitting: Internal Medicine

## 2017-07-05 MED ORDER — HYDROCODONE-HOMATROPINE 5-1.5 MG/5ML PO SYRP: 2.5000 mL | ORAL_SOLUTION | Freq: Four times a day (QID) | ORAL | 0 refills | Status: DC | PRN

## 2017-07-05 NOTE — Telephone Encounter (Signed)
Pt notified Rx ready for pickup. Rx printed and signed.  

## 2017-07-05 NOTE — Telephone Encounter (Signed)
Okay 6 ounce  1/2 teaspoon every 6-8 hours as needed for cough

## 2017-07-05 NOTE — Telephone Encounter (Signed)
Please advise on medication refill.  

## 2017-07-06 ENCOUNTER — Ambulatory Visit: Payer: Medicare HMO | Admitting: Internal Medicine

## 2017-07-06 ENCOUNTER — Encounter: Payer: Self-pay | Admitting: Internal Medicine

## 2017-07-06 VITALS — BP 100/60 | HR 95 | Ht 64.5 in | Wt 173.8 lb

## 2017-07-06 DIAGNOSIS — E1121 Type 2 diabetes mellitus with diabetic nephropathy: Secondary | ICD-10-CM

## 2017-07-06 DIAGNOSIS — Z794 Long term (current) use of insulin: Secondary | ICD-10-CM

## 2017-07-06 DIAGNOSIS — E7800 Pure hypercholesterolemia, unspecified: Secondary | ICD-10-CM

## 2017-07-06 LAB — POCT GLYCOSYLATED HEMOGLOBIN (HGB A1C): HEMOGLOBIN A1C: 8.3

## 2017-07-06 MED ORDER — GLUCOSE BLOOD VI STRP: ORAL_STRIP | 12 refills | Status: DC

## 2017-07-06 NOTE — Progress Notes (Signed)
Patient ID: [name] [name], female   DOB: 1928/11/14, 84 y.o.   MRN: [id number]  HPI: [name] [name] is a 84 y.o.-year-old female, returning for f/u for DM2, dx 2005, insulin-dependent, uncontrolled, with complications (nephropathy). Last visit 4 months ago. Ins.>> now [employer].  Last hemoglobin A1c was: Lab Results  Component Value Date   HGBA1C 8.3 02/24/2017   HGBA1C 8.4 11/15/2016   HGBA1C 8.7 08/18/2016   HGBA1C 9.4 03/29/2016   HGBA1C 7.8 12/28/2015   HGBA1C 8.4 09/22/2015   HGBA1C 9.1 (H) 06/08/2015   HGBA1C 8.7 (H) 02/26/2015   HGBA1C 7.8 (H) 08/07/2014   HGBA1C 8.5 (H) 03/20/2014   Pt is on a regimen of: (Still forgetting doses of insulin)  - Amaryl 4 mg 2x a day, before meals.  - Lantus 40 units at bedtime - NovoLog   16-18 units with a smaller meal  20-22 units with a regular meal  24-26 units for a larger meal We had to stop Metformin 2/2 increased creatinine. We stopped Amaryl at last visit.  Pt checks her sugars 1-2 times a day (forgot log): - am: 129-150 >> 63, 83-160 (higher if forgets Lantus) >> 100-160 - 2h after b'fast: 163-282 >> n/c >> 98-185 >> n/c - lunch: 164-185, 254 >> n/c >> 142-190 >> n/c - 2h after lunch: 161, 181 >> 120-150 >> n/c  - before dinner: 180, 192 >> n/c >> 217 >> n/c - later: <200, 302 x1 >> 99, 115-275, 299 >> 150-160 Lowest sugar was 63 >> 100. She has hypoglycemia awareness around 70.  Highest 299 >> 200.  + CKD stage III-IV- last BUN/Cr: Lab Results  Component Value Date   BUN 26 (H) 06/01/2017   CREATININE 1.50 (H) 06/01/2017   + HL; last set of lipids: Lab Results  Component Value Date   CHOL 203 (H) 06/01/2017   HDL 48.40 06/01/2017   LDLDIRECT 118.0 06/01/2017   TRIG 336.0 (H) 06/01/2017   CHOLHDL 4 06/01/2017  On Zocor 20. Pt's last eye exam was in 12/2016: No DR.-  Dr. Susa Loffler at [facility]mology [facility].  No numbness and tingling in her legs.  ROS: Constitutional: no weight  gain/no weight loss, no fatigue, no subjective hyperthermia, no subjective hypothermia Eyes: no blurry vision, no xerophthalmia ENT: no sore throat, no nodules palpated in throat, no dysphagia, no odynophagia, no hoarseness Cardiovascular: no CP/no SOB/no palpitations/no leg swelling Respiratory: + cough/no SOB/no wheezing Gastrointestinal: no N/no V/no D/no C/no acid reflux Musculoskeletal: + muscle aches/no joint aches Skin: no rashes, no hair loss Neurological: no tremors/no numbness/no tingling/no dizziness  I reviewed pt's medications, allergies, PMH, social hx, family hx, and changes were documented in the history of present illness. Otherwise, unchanged from my initial visit note.  PE: BP 100/60   Pulse 95   Ht 5' 4.5" (1.638 m)   Wt 173 lb 12.8 oz (78.8 kg)   SpO2 95%   BMI 29.37 kg/m  Body mass index is 29.37 kg/m. Wt Readings from Last 3 Encounters:  07/06/17 173 lb 12.8 oz (78.8 kg)  06/01/17 177 lb 3.2 oz (80.4 kg)  02/24/17 179 lb (81.2 kg)   Constitutional: overweight, in NAD Eyes: PERRLA, EOMI, no exophthalmos ENT: moist mucous membranes, no thyromegaly, no cervical lymphadenopathy Cardiovascular: tachycardia, RR, No RG,  +2/6 SEM Respiratory: CTA B Gastrointestinal: abdomen soft, NT, ND, BS+ Musculoskeletal: no deformities, strength intact in all 4 Skin: moist, warm, no rashes Neurological: no tremor with outstretched hands, DTR normal in  all 4  ASSESSMENT: 1. DM2, insulin-dependent, uncontrolled, without complications - + nephropathy  We have tried to stop Amaryl, but sugars were higher >> we restarted it.  2. HL  PLAN:  1. Patient with long standing DM, uncontrolled, DM2, with stable control since last visit, on a po + basal-bolus insulin regimen. She forgot her log today but relates no signif. Hypo or hyperglycemia. - will continue current regimen, and discussed goal HbA1c for age: <8% - I advised her to:   Patient Instructions  Please continue:  -  Amaryl 4 mg 2x a day, before meals.  - Lantus 40 units at bedtime - NovoLog   16-18 units with a smaller meal  20-22 units with a regular meal  24-26 units for a larger meal  Please return in 3-4 months with your sugar log  - today, HbA1c is 8.3% (stable) - continue checking sugars at different times of the day - check 3x a day, rotating checks - advised for yearly eye exams >> she is UTD - Return to clinic in 3 mo with sugar log    2. HL - Reviewed latest lipid panel: LDL higher, triglycerides also higher - discussed diet changes - Continues Zocor without side effects  Philemon Kingdom, MD PhD [facility] Endocrinology

## 2017-07-06 NOTE — Addendum Note (Signed)
Addended by: Drucilla Schmidt on: 07/06/2017 04:25 PM   Modules accepted: Orders

## 2017-07-06 NOTE — Patient Instructions (Addendum)
Please continue:  - Amaryl 4 mg 2x a day, before meals.  - Lantus 40 units at bedtime - NovoLog   16-18 units with a smaller meal  20-22 units with a regular meal  24-26 units for a larger meal  Please return in 3-4 months with your sugar log

## 2017-07-07 ENCOUNTER — Other Ambulatory Visit: Payer: Self-pay

## 2017-07-07 DIAGNOSIS — R69 Illness, unspecified: Secondary | ICD-10-CM | POA: Diagnosis not present

## 2017-07-07 MED ORDER — ONETOUCH ULTRASOFT LANCETS MISC: 12 refills | Status: DC

## 2017-07-07 MED ORDER — GLUCOSE BLOOD VI STRP: ORAL_STRIP | 12 refills | Status: DC

## 2017-07-07 MED ORDER — ONETOUCH ULTRA 2 W/DEVICE KIT: 1.0000 | PACK | Freq: Once | 0 refills | Status: DC

## 2017-07-11 DIAGNOSIS — R69 Illness, unspecified: Secondary | ICD-10-CM | POA: Diagnosis not present

## 2017-08-11 ENCOUNTER — Other Ambulatory Visit: Payer: Self-pay | Admitting: Internal Medicine

## 2017-08-24 ENCOUNTER — Other Ambulatory Visit: Payer: Self-pay | Admitting: Internal Medicine

## 2017-09-16 ENCOUNTER — Other Ambulatory Visit: Payer: Self-pay | Admitting: Internal Medicine

## 2017-10-05 ENCOUNTER — Ambulatory Visit: Payer: Medicare HMO | Admitting: Internal Medicine

## 2017-10-05 ENCOUNTER — Encounter: Payer: Self-pay | Admitting: Internal Medicine

## 2017-10-05 VITALS — BP 118/74 | HR 92 | Ht 64.5 in | Wt 174.8 lb

## 2017-10-05 DIAGNOSIS — R69 Illness, unspecified: Secondary | ICD-10-CM | POA: Diagnosis not present

## 2017-10-05 DIAGNOSIS — Z794 Long term (current) use of insulin: Secondary | ICD-10-CM

## 2017-10-05 DIAGNOSIS — E7800 Pure hypercholesterolemia, unspecified: Secondary | ICD-10-CM | POA: Diagnosis not present

## 2017-10-05 DIAGNOSIS — E1121 Type 2 diabetes mellitus with diabetic nephropathy: Secondary | ICD-10-CM | POA: Diagnosis not present

## 2017-10-05 LAB — POCT GLYCOSYLATED HEMOGLOBIN (HGB A1C)

## 2017-10-05 MED ORDER — GLUCOSE BLOOD VI STRP: ORAL_STRIP | 5 refills | Status: DC

## 2017-10-05 MED ORDER — BASAGLAR KWIKPEN 100 UNIT/ML ~~LOC~~ SOPN: 45.0000 [IU] | PEN_INJECTOR | Freq: Every day | SUBCUTANEOUS | 3 refills | Status: DC

## 2017-10-05 MED ORDER — INSULIN ASPART 100 UNIT/ML FLEXPEN: 20.0000 [IU] | PEN_INJECTOR | Freq: Three times a day (TID) | SUBCUTANEOUS | 3 refills | Status: DC

## 2017-10-05 NOTE — Progress Notes (Signed)
Patient ID: [name] [name], female   DOB: 1928-09-21, 84 y.o.   MRN: [id number]  HPI: [name] [name] is a 84 y.o.-year-old female, returning for f/u for DM2, dx 2005, insulin-dependent, uncontrolled, with complications (nephropathy). Last visit 3 months ago. Ins.>> now [employer].  Last hemoglobin A1c was: Lab Results  Component Value Date   HGBA1C 8.3 07/06/2017   HGBA1C 8.3 02/24/2017   HGBA1C 8.4 11/15/2016   HGBA1C 8.7 08/18/2016   HGBA1C 9.4 03/29/2016   HGBA1C 7.8 12/28/2015   HGBA1C 8.4 09/22/2015   HGBA1C 9.1 (H) 06/08/2015   HGBA1C 8.7 (H) 02/26/2015   HGBA1C 7.8 (H) 08/07/2014   Pt is on a regimen of: - She is still forgetting some doses of insulin - Amaryl 4 mg 2x a day, before meals.  - Lantus 40 units at bedtime >> expensive, $200 - NovoLog >> less expensive, $65  16-18 units with a smaller meal  20-22 units with a regular meal  24-26 units for a larger meal We had to stop Metformin 2/2 increased creatinine. We stopped Amaryl at last visit.  Pt checks her sugars 1-2 times a day - brought older logs: - am: 129-150 >> 63, 83-160 >> 100-160 >> 100-200 - 2h after b'fast: 163-282 >> n/c >> 98-185 >> n/c - lunch: 164-185, 254 >> n/c >> 142-190 >> n/c >> 150-180 - 2h after lunch: 161, 181 >> 120-150 >> n/c  - before dinner: 180, 192 >> n/c >> 217 >> n/c - later: <200, 302 x1 >> 99, 115-275, 299 >> 150-160 >> 200-260 Lowest sugar was 63 >> 100 >> 99. She has hypoglycemia awareness around 70. Highest 299 >> 200 >> 300.  + CKD stage III-IV- last BUN/Cr: Lab Results  Component Value Date   BUN 26 (H) 06/01/2017   CREATININE 1.50 (H) 06/01/2017   + HL; last set of lipids: Lab Results  Component Value Date   CHOL 203 (H) 06/01/2017   HDL 48.40 06/01/2017   LDLDIRECT 118.0 06/01/2017   TRIG 336.0 (H) 06/01/2017   CHOLHDL 4 06/01/2017  On Zocor 20. Pt's last eye exam was in 12/2016: No  DR.-  Dr. Susa Loffler at [facility] ophthalmology associates.  She  denies numbness and tingling in her feet  ROS: Constitutional: no weight gain/no weight loss, no fatigue, no subjective hyperthermia, no subjective hypothermia Eyes: no blurry vision, no xerophthalmia ENT: no sore throat, no nodules palpated in throat, no dysphagia, no odynophagia, no hoarseness Cardiovascular: no CP/no SOB/no palpitations/no leg swelling Respiratory: no cough/no SOB/no wheezing Gastrointestinal: no N/no V/no D/no C/no acid reflux Musculoskeletal: no muscle aches/no joint aches Skin: no rashes, no hair loss Neurological: no tremors/no numbness/no tingling/no dizziness  I reviewed pt's medications, allergies, PMH, social hx, family hx, and changes were documented in the history of present illness. Otherwise, unchanged from my initial visit note.  PE: BP 118/74   Pulse 92   Ht 5' 4.5" (1.638 m)   Wt 174 lb 12.8 oz (79.3 kg)   SpO2 95%   BMI 29.54 kg/m  Body mass index is 29.54 kg/m. Wt Readings from Last 3 Encounters:  10/05/17 174 lb 12.8 oz (79.3 kg)  07/06/17 173 lb 12.8 oz (78.8 kg)  06/01/17 177 lb 3.2 oz (80.4 kg)   Constitutional: overweight, in NAD Eyes: PERRLA, EOMI, no exophthalmos ENT: moist mucous membranes, no thyromegaly, no cervical lymphadenopathy Cardiovascular: tachycardia, RR, No RG, + 2/6 SEM Respiratory: CTA B Gastrointestinal: abdomen soft, NT, ND, BS+ Musculoskeletal: no deformities,  strength intact in all 4 Skin: moist, warm, no rashes Neurological: no tremor with outstretched hands, DTR normal in all 4  ASSESSMENT: 1. DM2, insulin-dependent, uncontrolled, without complications - + nephropathy  We have tried to stop Amaryl, but sugars were higher >> we restarted it.  2. HL  PLAN:  1. Patient with long-standing, uncontrolled, type 2 diabetes, with stable controlled at last visit on a p.o. + basal-bolus insulin regimen.  At last visit, HbA1c was 8.3%.  We again discussed that her goal for her age is less than 8%. - At this visit,  sugars at home are approximately the same as before, possibly higher in the morning.  She forgot her sugar logs from the last 3 months at home, and she brought the ones from before last visit instead... Given more sugar logs and advised her to bring them at next visit. - As Lantus is expensive, will try to switch to [employer].  If this is not covered, we can try Levemir, and ultimately NPH, but I explained that this is twice a day and it would be cheaper in vial form.  She will let me know - As sugars are higher, we discussed about the possibility of adding a GLP-1 receptor agonist and I gave her a list of these to see which one may be covered by her insurance - In the meantime, we will increase her basal insulin slightly, and also, since her sugars after dinner are higher, I advised her to take a higher dose of NovoLog before dinner. - I advised her to:    Patient Instructions  Please continue:  - Amaryl 4 mg 2x a day, before meals.  Please increase:  - Basaglar to 45 units at bedtime - NovoLog   20-22 units with a smaller meal  24-26 units with a regular meal  28-30 units for a larger meal  Please return in 3-4 monthswith your sugar log  - today, HbA1c is 8.3% (stable) - continue checking sugars at different times of the day - check 3x a day, rotating checks - advised for yearly eye exams >> she is UTD - Return to clinic in 3 mo with sugar log     2. HL -reviewed latest lipid panel from 05/2017: LDL higher, triglycerides also higher -Again discussed dietary changes -Continues Zocor without side effects  Philemon Kingdom, MD PhD [facility] Endocrinology

## 2017-10-05 NOTE — Patient Instructions (Addendum)
Patient Instructions  Please continue:  - Amaryl 4 mg 2x a day, before meals.  Please increase:  - Basaglar to 45 units at bedtime - NovoLog   20-22 units with a smaller meal  24-26 units with a regular meal  28-30 units for a larger meal  Please return in 3-4 monthswith your sugar log

## 2017-10-06 ENCOUNTER — Telehealth: Payer: Self-pay | Admitting: Internal Medicine

## 2017-10-06 NOTE — Telephone Encounter (Signed)
Patient stated she would like doctor to send in new prescription for trulicity  over to [place] ([place] [place] [address] [place]

## 2017-10-10 NOTE — Telephone Encounter (Signed)
If she does not have any history of pancreatitis or a personal or family history of medullary thyroid cancer, we can start this is if covered by her insurance.    Let's try to send 4 pens of 0.75 mg weekly, to be injected for example Sunday morning, before breakfast.  If this goes well, please tell her to call us to send the higher dose to her pharmacy when she is almost out of all of the pens.  If she does start this, she needs a lower dose of insulin, so she does not need to increase as advised at last visit -keep the insulin at:             - Lantus 40 units at bedtime  - NovoLog   16-18 units with a smaller meal  20-22 units with a regular meal  24-26 units for a larger meal She is also taking Amaryl, which she may need to stop after starting Trulicity.

## 2017-10-10 NOTE — Telephone Encounter (Signed)
OK to send.

## 2017-10-10 NOTE — Telephone Encounter (Signed)
Actually let me review her chart  I just realized this is not a refill.

## 2017-10-12 ENCOUNTER — Other Ambulatory Visit: Payer: Self-pay | Admitting: Internal Medicine

## 2017-10-13 ENCOUNTER — Other Ambulatory Visit: Payer: Self-pay | Admitting: Internal Medicine

## 2017-10-13 MED ORDER — DULAGLUTIDE 0.75 MG/0.5ML ~~LOC~~ SOAJ: SUBCUTANEOUS | 0 refills | Status: DC

## 2017-10-13 NOTE — Telephone Encounter (Addendum)
Spoke to patient.  Gave instructions. Patient verbalized understanding Will c/b to go over insulin decrease scale if Trulicity approved.  Sent Rx.

## 2017-10-13 NOTE — Addendum Note (Signed)
Addended by: Leia Alf on: 10/13/2017 12:41 PM   Modules accepted: Orders

## 2017-10-31 ENCOUNTER — Other Ambulatory Visit: Payer: Self-pay | Admitting: Internal Medicine

## 2017-11-02 ENCOUNTER — Other Ambulatory Visit: Payer: Self-pay | Admitting: Internal Medicine

## 2017-11-08 ENCOUNTER — Other Ambulatory Visit: Payer: Self-pay | Admitting: Internal Medicine

## 2017-11-08 ENCOUNTER — Telehealth: Payer: Self-pay | Admitting: Internal Medicine

## 2017-11-08 NOTE — Telephone Encounter (Signed)
OK, no trulicity then. We can try Humalog if covered, if not, we can use Regular insulin from [facility] - vials - same doses as with NovoLog but injected 30 min before meals. This is not in a pen, though.

## 2017-11-08 NOTE — Telephone Encounter (Signed)
Patient would like a call back to get some advice on some medication she is taking  Please advise

## 2017-11-08 NOTE — Telephone Encounter (Signed)
I called pt- she states the prices of her Trulicity and Novolog have gone up too much. She states she can not afford either. She also states she has felt better with the Trulicity, but it costs too much. Please advise    She also states her Lantus solostar is too expensive. I advised her that a new rx was sent on 10/05/17 for Basaglar to replace Lantus. She states [facility] told her she did not have a Engineer, agricultural rx. I called [facility] and they state they did receive the prescription and will fill it today. Pt informed of this.

## 2017-11-09 NOTE — Telephone Encounter (Signed)
Called pt. No answer °

## 2017-11-10 NOTE — Telephone Encounter (Signed)
Called pt- no answer. Left mess for patient to call back.

## 2017-11-13 NOTE — Telephone Encounter (Signed)
Spoke to patient. Went over Dr. Synthia Innocent previous note. Pt states she has been feeling better since being on Trulicity and thinks it may be best to stay on it. Mailed patient Trulicity Savings card. Pt was grateful.

## 2017-11-27 ENCOUNTER — Other Ambulatory Visit: Payer: Self-pay | Admitting: Internal Medicine

## 2017-12-05 ENCOUNTER — Other Ambulatory Visit: Payer: Self-pay | Admitting: Internal Medicine

## 2017-12-12 ENCOUNTER — Ambulatory Visit (INDEPENDENT_AMBULATORY_CARE_PROVIDER_SITE_OTHER): Payer: Medicare HMO | Admitting: Internal Medicine

## 2017-12-12 ENCOUNTER — Encounter: Payer: Self-pay | Admitting: Internal Medicine

## 2017-12-12 VITALS — BP 130/60 | HR 102 | Ht 64.5 in | Wt 175.0 lb

## 2017-12-12 DIAGNOSIS — Z794 Long term (current) use of insulin: Secondary | ICD-10-CM

## 2017-12-12 DIAGNOSIS — E1121 Type 2 diabetes mellitus with diabetic nephropathy: Secondary | ICD-10-CM | POA: Diagnosis not present

## 2017-12-12 DIAGNOSIS — E7800 Pure hypercholesterolemia, unspecified: Secondary | ICD-10-CM | POA: Diagnosis not present

## 2017-12-12 LAB — POCT GLYCOSYLATED HEMOGLOBIN (HGB A1C): Hemoglobin A1C: 7.2 % — AB (ref 4.0–5.6)

## 2017-12-12 MED ORDER — DULAGLUTIDE 1.5 MG/0.5ML ~~LOC~~ SOAJ: 1.5000 mg | SUBCUTANEOUS | 11 refills | Status: DC

## 2017-12-12 MED ORDER — BASAGLAR KWIKPEN 100 UNIT/ML ~~LOC~~ SOPN: 38.0000 [IU] | PEN_INJECTOR | Freq: Every day | SUBCUTANEOUS | 3 refills | Status: DC

## 2017-12-12 NOTE — Addendum Note (Signed)
Addended by: Drucilla Schmidt on: 12/12/2017 04:22 PM   Modules accepted: Orders

## 2017-12-12 NOTE — Progress Notes (Signed)
Patient ID: [name] [name], female   DOB: [birth year], 84 y.o.   MRN: [id number]  HPI: [name] [name] is a 84 y.o.-year-old female, returning for f/u for DM2, dx 2005, insulin-dependent, uncontrolled, with complications (nephropathy). Last visit 3 months ago. Ins.>> now [employer].   Since last visit, sugars improved after starting Trulicity. She is tolerating this very well but this is an expensive medication for her ($160 per month).  She would like to continue with it, though.  Her daughter is helping her with the expenses.  Last hemoglobin A1c was: Lab Results  Component Value Date   HGBA1C 8.4% 10/05/2017   HGBA1C 8.3 07/06/2017   HGBA1C 8.3 02/24/2017   HGBA1C 8.4 11/15/2016   HGBA1C 8.7 08/18/2016   HGBA1C 9.4 03/29/2016   HGBA1C 7.8 12/28/2015   HGBA1C 8.4 09/22/2015   HGBA1C 9.1 (H) 06/08/2015   HGBA1C 8.7 (H) 02/26/2015   Pt is on a regimen of: - Trulicity 9.76 mg weekly  - started 10/2017 - Amaryl 4 mg 2x a day, before meals.  - Lantus  >> Basaglar 45 units at bedtime >> expensive, $200 - NovoLog >> less expensive, $65  16-18 units with a smaller meal  20-22 units with a regular meal  24-26 units for a larger meal We had to stop Metformin 2/2 increased creatinine. We stopped Amaryl at last visit.  Pt checks her sugars 2x a day: - am: 100-160 >> 100-200 >> 71, 77-159, 162, 171 - 2h after b'fast: 163-282 >> n/c >> 98-185 >> n/c - lunch:  142-190 >> n/c >> 150-180 >> 126 - 2h after lunch: 161, 181 >> 120-150 >> n/c  - before dinner: 180, 192 >> n/c >> 217 >> n/c - later: 150-160 >> 200-260 >> 126, 146-280 Lowest sugar was 99 >> 126 >> 71 . She has hypoglycemia awareness around 70. Highest 300 >> 331.  + CKD stage 3-4- last BUN/Cr: Lab Results  Component Value Date   BUN 26 (H) 06/01/2017   CREATININE 1.50 (H) 06/01/2017   + HL; last set of lipids: Lab Results  Component Value Date   CHOL 203 (H) 06/01/2017   HDL 48.40 06/01/2017   LDLDIRECT 118.0  06/01/2017   TRIG 336.0 (H) 06/01/2017   CHOLHDL 4 06/01/2017  On Zocor 20. Pt's last eye exam was in 12/2016: No DR.-  Dr. Susa Loffler at [facility].  She denies numbness and tingling in her feet  ROS: Constitutional: no weight gain/no weight loss, + fatigue, no subjective hyperthermia, no subjective hypothermia Eyes: no blurry vision, no xerophthalmia ENT: no sore throat, no nodules palpated in throat, no dysphagia, no odynophagia, no hoarseness Cardiovascular: no CP/+ SOB/no palpitations/no leg swelling Respiratory: no cough/+ SOB/no wheezing Gastrointestinal: no N/no V/+ occas. D/no C/no acid reflux Musculoskeletal: + muscle aches/no joint aches Skin: no rashes, no hair loss Neurological: no tremors/no numbness/no tingling/no dizziness  I reviewed pt's medications, allergies, PMH, social hx, family hx, and changes were documented in the history of present illness. Otherwise, unchanged from my initial visit note.  Past Medical History:  Diagnosis Date  . Asthma   . DEGENERATIVE JOINT DISEASE, GENERALIZED 02/01/2007   Qualifier: Diagnosis of  By: Burnice Logan  MD, Doretha Sou   . DIARRHEA, CHRONIC 08/20/2009   Qualifier: Diagnosis of  By: Burnice Logan  MD, Doretha Sou   . DM 02/01/2007   Qualifier: Diagnosis of  By: Floyde Parkins    . FATIGUE 10/20/2008   Qualifier: Diagnosis of  By: Burnice Logan  MD, Doretha Sou   . HYPERCHOLESTEROLEMIA 02/01/2007   Qualifier: Diagnosis of  By: Floyde Parkins    . HYPERLIPIDEMIA 12/20/2007   Qualifier: Diagnosis of  By: Burnice Logan  MD, Doretha Sou   . HYPERTENSION 02/01/2007   Qualifier: Diagnosis of  By: Floyde Parkins    . LOW BACK PAIN 01/13/2010   Qualifier: Diagnosis of  By: Burnice Logan  MD, Doretha Sou MUSCLE CRAMPS 04/06/2010   Qualifier: Diagnosis of  By: Burnice Logan  MD, Doretha Sou   . MYALGIA 01/20/2010   Qualifier: Diagnosis of  By: Elease Hashimoto MD, Bruce    . OSTEOARTHRITIS 01/13/2010   Qualifier: Diagnosis of  By: Burnice Logan  MD, Doretha Sou    . OVERACTIVE BLADDER 08/20/2009   Qualifier: Diagnosis of  By: Burnice Logan  MD, Doretha Sou   . PLEURISY 06/18/2008   Qualifier: Diagnosis of  By: Niel Hummer MD, [place] PNEUMONIA 06/18/2008   Qualifier: Diagnosis of  By: Niel Hummer MD, [place] Rectal prolapse 09/20/2007   Qualifier: Diagnosis of  By: Burnice Logan  MD, Doretha Sou   . VITAMIN B12 DEFICIENCY 07/20/2010   Qualifier: Diagnosis of  By: Carlean Purl MD, Dimas Millin    Past Surgical History:  Procedure Laterality Date  . ABDOMINAL HYSTERECTOMY     partial- states she has her ovaries  . APPENDECTOMY    . BLADDER SURGERY    . CATARACT EXTRACTION Bilateral 2012  . CHOLECYSTECTOMY     Social History   Socioeconomic History  . Marital status: Single    Spouse name: Not on file  . Number of children: Not on file  . Years of education: Not on file  . Highest education level: Not on file  Occupational History  . Not on file  Social Needs  . Financial resource strain: Not on file  . Food insecurity:    Worry: Not on file    Inability: Not on file  . Transportation needs:    Medical: Not on file    Non-medical: Not on file  Tobacco Use  . Smoking status: Former Smoker    Packs/day: 10.00  . Smokeless tobacco: Never Used  . Tobacco comment: quit > 73 yo   Substance and Sexual Activity  . Alcohol use: No  . Drug use: No  . Sexual activity: Not on file  Lifestyle  . Physical activity:    Days per week: Not on file    Minutes per session: Not on file  . Stress: Not on file  Relationships  . Social connections:    Talks on phone: Not on file    Gets together: Not on file    Attends religious service: Not on file    Active member of club or organization: Not on file    Attends meetings of clubs or organizations: Not on file    Relationship status: Not on file  . Intimate partner violence:    Fear of current or ex partner: Not on file    Emotionally abused: Not on file    Physically abused: Not on file     Forced sexual activity: Not on file  Other Topics Concern  . Not on file  Social History Narrative  . Not on file   Current Outpatient Medications on File Prior to Visit  Medication Sig Dispense Refill  . Acetaminophen 650 MG TABS Take 1 tablet (650 mg total) by mouth 3 (three) times daily as needed. 30 tablet 0  .  aspirin 81 MG tablet Take 81 mg by mouth daily.      . calcium-vitamin D (OSCAL WITH D 500-200) 500-200 MG-UNIT per tablet Take 1 tablet by mouth daily.      . cyanocobalamin 1000 MCG tablet Take 100 mcg by mouth daily.      . diphenoxylate-atropine (LOMOTIL) 2.5-0.025 MG tablet Take 1 tablet by mouth 4 (four) times daily as needed for diarrhea or loose stools. Take 2 tablets initially then take 1 tablet every 4 hours as needed for diarrhea. 30 tablet 0  . fluticasone (FLONASE) 50 MCG/ACT nasal spray Place 1 spray into the nose daily. 16 g 0  . glimepiride (AMARYL) 4 MG tablet Take 1 tablet (4 mg total) by mouth 2 (two) times daily before a meal. 180 tablet 3  . glimepiride (AMARYL) 4 MG tablet TAKE 1 TABLET BY MOUTH TWICE DAILY 180 tablet 0  . glucose blood (ONE TOUCH ULTRA TEST) test strip Use as instructed to test 3 times daily DX: E11.21 300 each 5  . hydrochlorothiazide (HYDRODIURIL) 25 MG tablet Take 1 tablet (25 mg total) by mouth daily. 90 tablet 3  . HYDROcodone-homatropine (HYCODAN) 5-1.5 MG/5ML syrup Take 2.5 mLs by mouth every 6 (six) hours as needed for cough. 180 mL 0  . ibuprofen (ADVIL,MOTRIN) 600 MG tablet TAKE 1 TABLET BY MOUTH EVERY 6 HOURS AS NEEDED FOR PAIN 60 tablet 1  . insulin aspart (NOVOLOG FLEXPEN) 100 UNIT/ML FlexPen Inject 20-30 Units into the skin 3 (three) times daily with meals. 45 mL 3  . Insulin Glargine (BASAGLAR KWIKPEN) 100 UNIT/ML SOPN Inject 0.45 mLs (45 Units total) into the skin at bedtime. 5 pen 3  . Insulin Pen Needle (BD PEN NEEDLE NANO U/F) 32G X 4 MM MISC USE TO INJECT INSULIN 4 TIMES DAILY AS DIRECTED. 130 each 11  . Lancets (ONETOUCH  ULTRASOFT) lancets Use as instructed to test 3 times daily 100 each 12  . Multiple Vitamin (MULTIVITAMIN) tablet Take 1 tablet by mouth daily.      Marland Kitchen NOVOLOG FLEXPEN 100 UNIT/ML FlexPen INJECT 15-18 UNITS SUBCUTANEOUSLY THREE TIMES DAILY WITH MEALS 15 pen 3  . NOVOLOG FLEXPEN 100 UNIT/ML FlexPen INJECT 15-18 UNITS SUBCUTANEOUSLY THREE TIMES DAILY WITH MEALS 15 pen 3  . simvastatin (ZOCOR) 40 MG tablet TAKE 1/2 (ONE-HALF) TABLET BY MOUTH AT BEDTIME 45 tablet 1  . traZODone (DESYREL) 150 MG tablet TAKE ONE TABLET BY MOUTH ONCE DAILY AT BEDTIME AS NEEDED 90 tablet 3  . TRULICITY 8.29 HB/7.1IR SOPN INJECT 1  SUBCUTANEOUSLY ONCE A WEEK SUNDAY  MORNING  BEFORE  BREAKFAST 4 pen 0  . venlafaxine XR (EFFEXOR-XR) 75 MG 24 hr capsule TAKE 1 CAPSULE BY MOUTH ONCE DAILY 90 capsule 1   No current facility-administered medications on file prior to visit.    Allergies  Allergen Reactions  . Metformin     REACTION: diarrhea  . Sitagliptin Phosphate     REACTION: abdominal pain   Family History  Problem Relation Age of Onset  . Deep vein thrombosis Mother   . COPD Father   . Asthma Father   . Diabetes Neg Hx        family hx  . Cancer Neg Hx        breast ca - sister(S)?  Marland Kitchen Heart disease Neg Hx        family hx     PE: BP 130/60   Pulse (!) 102   Ht 5' 4.5" (1.638 m)   Wt  175 lb (79.4 kg)   SpO2 95%   BMI 29.57 kg/m  Body mass index is 29.57 kg/m. Wt Readings from Last 3 Encounters:  12/12/17 175 lb (79.4 kg)  10/05/17 174 lb 12.8 oz (79.3 kg)  07/06/17 173 lb 12.8 oz (78.8 kg)   Constitutional: overweight, in NAD Eyes: PERRLA, EOMI, no exophthalmos ENT: moist mucous membranes, no thyromegaly, no cervical lymphadenopathy Cardiovascular: tachycardia, RR, No RG,  + 2/6 SEM Respiratory: CTA B Gastrointestinal: abdomen soft, NT, ND, BS+ Musculoskeletal: no deformities, strength intact in all 4 Skin: moist, warm, no rashes Neurological: no tremor with outstretched hands, DTR normal in  all 4   ASSESSMENT: 1. DM2, insulin-dependent, uncontrolled, without complications - + nephropathy  We have tried to stop Amaryl, but sugars were higher >> we restarted it.  2. HL  PLAN:  1. Patient with long-standing, uncontrolled, type 2 diabetes, on a complex regimen containing oral medication (Amaryl), basal-bolus insulin regimen, and now GLP-1 receptor agonist added at last visit.  We started Trulicity at 7.00 mg weekly, the lowest dose, which she tolerates well and she saw a significant improvement in her sugar since she started this.  The sugars still increase throughout the day in a stepwise fashion so at this point I advised her to increase the dose to 1.5 mg weekly.  The only barrier for treatment is the cost of the medication, $160 per 4 pens.  Her daughter is helping her obtain the medication but this is still very expensive.  At this visit, I gave her a sample box for 1 month, but I cannot continue to provide her this.  She does want to continue for now so I sent in another prescription for the higher dose and I hope that this may become more affordable for her in the future. - As we are increasing the dose of Trulicity, and since she has occasional lower blood sugars in the morning, I advised her to decrease the Basaglar from 45 units to 38 units at bedtime.  We will not change the dose of NovoLog, but I advised her to stay with the lower doses recommended. - I advised her to:   Patient Instructions  Please continue:  - Amaryl 4 mg 2x a day, before meals.  Increase: - Trulicity to 1.5 mg weekly.  Please reduce:  - Basaglar to 38 units at bedtime  Continue: - NovoLog   20-22 units with a smaller meal  24-26 units with a regular meal  28-30 units for a larger meal  Please return in 3 months with your sugar log  - today, HbA1c is 7.2% (better!) - continue checking sugars at different times of the day - check 3x a day, rotating checks - advised for yearly eye exams >>  she is UTD - Return to clinic in 3 mo with sugar log     2. HL - Reviewed latest lipid panel from 05/2017: LDL higher, triglycerides also higher - I believe these will improve with improving diabetes control - Continues Zocor without side effects  - time spent with the patient: 25 minutes, of which >50% was spent in obtaining information about her symptoms, reviewing her previous labs, evaluations, and treatments, counseling her about her treatment  and developing a plan to further investigate and treat it.   Philemon Kingdom, MD PhD  [facility]

## 2017-12-12 NOTE — Patient Instructions (Addendum)
Please continue:  - Amaryl 4 mg 2x a day, before meals.  Increase: - Trulicity to 1.5 mg weekly.  Please reduce:  - Basaglar to 38 units at bedtime  Continue: - NovoLog   20-22 units with a smaller meal  24-26 units with a regular meal  28-30 units for a larger meal  Please return in 3 months with your sugar log

## 2018-01-10 DIAGNOSIS — R69 Illness, unspecified: Secondary | ICD-10-CM | POA: Diagnosis not present

## 2018-01-31 ENCOUNTER — Other Ambulatory Visit: Payer: Self-pay | Admitting: Internal Medicine

## 2018-02-19 ENCOUNTER — Other Ambulatory Visit: Payer: Self-pay | Admitting: Internal Medicine

## 2018-02-23 ENCOUNTER — Ambulatory Visit: Payer: 59 | Admitting: Internal Medicine

## 2018-02-23 DIAGNOSIS — Z0289 Encounter for other administrative examinations: Secondary | ICD-10-CM

## 2018-02-23 NOTE — Progress Notes (Deleted)
Patient ID: [name] [name], female   DOB: 1928-07-14, 84 y.o.   MRN: [id number]  HPI: [name] [name] is a 84 y.o.-year-old female, returning for f/u for DM2, dx 2005, insulin-dependent, uncontrolled, with complications (nephropathy). Last visit 2.5 mo ago Ins.>> now [place].   Last hemoglobin A1c was: Lab Results  Component Value Date   HGBA1C 7.2 (A) 12/12/2017   HGBA1C 8.4% 10/05/2017   HGBA1C 8.3 07/06/2017   HGBA1C 8.3 02/24/2017   HGBA1C 8.4 11/15/2016   HGBA1C 8.7 08/18/2016   HGBA1C 9.4 03/29/2016   HGBA1C 7.8 12/28/2015   HGBA1C 8.4 09/22/2015   HGBA1C 9.1 (H) 06/08/2015   Pt is on a regimen of: - Amaryl 4 mg 2x a day, before meals. - Trulicity 1.5 mg weekly - increase 12/2017 - expensive  ($160 per month). Daughter helping with the expense. - Basaglar 38 units at bedtime - NovoLog   20-22 units with a smaller meal  24-26 units with a regular meal  28-30 units for a larger meal We had to stop Metformin 2/2 increased creatinine. We stopped Amaryl at last visit.  Pt checks her sugars 2x a day: - am: 100-160 >> 100-200 >> 71, 77-159, 162, 171 - 2h after b'fast: 163-282 >> n/c >> 98-185 >> n/c - lunch:  142-190 >> n/c >> 150-180 >> 126 - 2h after lunch: 161, 181 >> 120-150 >> n/c  - before dinner: 180, 192 >> n/c >> 217 >> n/c - later: 150-160 >> 200-260 >> 126, 146-280 Lowest sugar was 99 >> 126 >> 71 >> *** . She has hypoglycemia awareness ~70. Highest 300 >> 331 >> ***.  + CKD stage 3-4; last BUN/Cr: Lab Results  Component Value Date   BUN 26 (H) 06/01/2017   CREATININE 1.50 (H) 06/01/2017   + HL; last set of lipids: Lab Results  Component Value Date   CHOL 203 (H) 06/01/2017   HDL 48.40 06/01/2017   LDLDIRECT 118.0 06/01/2017   TRIG 336.0 (H) 06/01/2017   CHOLHDL 4 06/01/2017  On Zocor 20. Pt's last eye exam was in 12/2016: No DR.-  Dr. Susa Loffler at [facility].  No numbness and tingling in her  feet  ROS: Constitutional: no weight gain/no weight loss, no fatigue, no subjective hyperthermia, no subjective hypothermia Eyes: no blurry vision, no xerophthalmia ENT: no sore throat, no nodules palpated in throat, no dysphagia, no odynophagia, no hoarseness Cardiovascular: no CP/no SOB/no palpitations/no leg swelling Respiratory: no cough/no SOB/no wheezing Gastrointestinal: no N/no V/no D/no C/no acid reflux Musculoskeletal: no muscle aches/no joint aches Skin: no rashes, no hair loss Neurological: no tremors/no numbness/no tingling/no dizziness  I reviewed pt's medications, allergies, PMH, social hx, family hx, and changes were documented in the history of present illness. Otherwise, unchanged from my initial visit note.  Past Medical History:  Diagnosis Date  . Asthma   . DEGENERATIVE JOINT DISEASE, GENERALIZED 02/01/2007   Qualifier: Diagnosis of  By: Burnice Logan  MD, Doretha Sou   . DIARRHEA, CHRONIC 08/20/2009   Qualifier: Diagnosis of  By: Burnice Logan  MD, Doretha Sou   . DM 02/01/2007   Qualifier: Diagnosis of  By: Floyde Parkins    . FATIGUE 10/20/2008   Qualifier: Diagnosis of  By: Burnice Logan  MD, Doretha Sou   . HYPERCHOLESTEROLEMIA 02/01/2007   Qualifier: Diagnosis of  By: Floyde Parkins    . HYPERLIPIDEMIA 12/20/2007   Qualifier: Diagnosis of  By: Burnice Logan  MD, Doretha Sou   . HYPERTENSION 02/01/2007  Qualifier: Diagnosis of  By: Floyde Parkins    . LOW BACK PAIN 01/13/2010   Qualifier: Diagnosis of  By: Burnice Logan  MD, Doretha Sou MUSCLE CRAMPS 04/06/2010   Qualifier: Diagnosis of  By: Burnice Logan  MD, Doretha Sou   . MYALGIA 01/20/2010   Qualifier: Diagnosis of  By: Elease Hashimoto MD, [place]    . OSTEOARTHRITIS 01/13/2010   Qualifier: Diagnosis of  By: Burnice Logan  MD, Doretha Sou   . OVERACTIVE BLADDER 08/20/2009   Qualifier: Diagnosis of  By: Burnice Logan  MD, Doretha Sou   . PLEURISY 06/18/2008   Qualifier: Diagnosis of  By: Niel Hummer MD, [place] PNEUMONIA 06/18/2008   Qualifier: Diagnosis of   By: Niel Hummer MD, [place] Rectal prolapse 09/20/2007   Qualifier: Diagnosis of  By: Burnice Logan  MD, Doretha Sou   . VITAMIN B12 DEFICIENCY 07/20/2010   Qualifier: Diagnosis of  By: Carlean Purl MD, Dimas Millin    Past Surgical History:  Procedure Laterality Date  . ABDOMINAL HYSTERECTOMY     partial- states she has her ovaries  . APPENDECTOMY    . BLADDER SURGERY    . CATARACT EXTRACTION Bilateral 2012  . CHOLECYSTECTOMY     Social History   Socioeconomic History  . Marital status: Single    Spouse name: Not on file  . Number of children: Not on file  . Years of education: Not on file  . Highest education level: Not on file  Occupational History  . Not on file  Social Needs  . Financial resource strain: Not on file  . Food insecurity:    Worry: Not on file    Inability: Not on file  . Transportation needs:    Medical: Not on file    Non-medical: Not on file  Tobacco Use  . Smoking status: Former Smoker    Packs/day: 10.00  . Smokeless tobacco: Never Used  . Tobacco comment: quit > 61 yo   Substance and Sexual Activity  . Alcohol use: No  . Drug use: No  . Sexual activity: Not on file  Lifestyle  . Physical activity:    Days per week: Not on file    Minutes per session: Not on file  . Stress: Not on file  Relationships  . Social connections:    Talks on phone: Not on file    Gets together: Not on file    Attends religious service: Not on file    Active member of club or organization: Not on file    Attends meetings of clubs or organizations: Not on file    Relationship status: Not on file  . Intimate partner violence:    Fear of current or ex partner: Not on file    Emotionally abused: Not on file    Physically abused: Not on file    Forced sexual activity: Not on file  Other Topics Concern  . Not on file  Social History Narrative  . Not on file   Current Outpatient Medications on File Prior to Visit  Medication Sig Dispense Refill  . Acetaminophen 650  MG TABS Take 1 tablet (650 mg total) by mouth 3 (three) times daily as needed. 30 tablet 0  . aspirin 81 MG tablet Take 81 mg by mouth daily.      . calcium-vitamin D (OSCAL WITH D 500-200) 500-200 MG-UNIT per tablet Take 1 tablet by mouth daily.      . cyanocobalamin 1000  MCG tablet Take 100 mcg by mouth daily.      . diphenoxylate-atropine (LOMOTIL) 2.5-0.025 MG tablet Take 1 tablet by mouth 4 (four) times daily as needed for diarrhea or loose stools. Take 2 tablets initially then take 1 tablet every 4 hours as needed for diarrhea. 30 tablet 0  . Dulaglutide (TRULICITY) 1.5 HD/6.2IW SOPN Inject 1.5 mg into the skin once a week. 4 pen 11  . fluticasone (FLONASE) 50 MCG/ACT nasal spray Place 1 spray into the nose daily. 16 g 0  . glimepiride (AMARYL) 4 MG tablet TAKE 1 TABLET BY MOUTH TWICE DAILY 180 tablet 0  . glucose blood (ONE TOUCH ULTRA TEST) test strip Use as instructed to test 3 times daily DX: E11.21 300 each 5  . hydrochlorothiazide (HYDRODIURIL) 25 MG tablet Take 1 tablet (25 mg total) by mouth daily. 90 tablet 3  . HYDROcodone-homatropine (HYCODAN) 5-1.5 MG/5ML syrup Take 2.5 mLs by mouth every 6 (six) hours as needed for cough. 180 mL 0  . ibuprofen (ADVIL,MOTRIN) 600 MG tablet TAKE 1 TABLET BY MOUTH EVERY 6 HOURS AS NEEDED FOR PAIN 60 tablet 1  . insulin aspart (NOVOLOG FLEXPEN) 100 UNIT/ML FlexPen Inject 20-30 Units into the skin 3 (three) times daily with meals. 45 mL 3  . Insulin Glargine (BASAGLAR KWIKPEN) 100 UNIT/ML SOPN Inject 0.38 mLs (38 Units total) into the skin at bedtime. 5 pen 3  . Insulin Pen Needle (BD PEN NEEDLE NANO U/F) 32G X 4 MM MISC USE TO INJECT INSULIN 4 TIMES DAILY AS DIRECTED. 130 each 11  . Lancets (ONETOUCH ULTRASOFT) lancets Use as instructed to test 3 times daily 100 each 12  . Multiple Vitamin (MULTIVITAMIN) tablet Take 1 tablet by mouth daily.      . simvastatin (ZOCOR) 40 MG tablet TAKE 1/2 (ONE-HALF) TABLET BY MOUTH AT BEDTIME 45 tablet 1  . traZODone  (DESYREL) 150 MG tablet TAKE 1 TABLET BY MOUTH AT BEDTIME AS NEEDED 90 tablet 3  . venlafaxine XR (EFFEXOR-XR) 75 MG 24 hr capsule TAKE 1 CAPSULE BY MOUTH ONCE DAILY 90 capsule 1   No current facility-administered medications on file prior to visit.    Allergies  Allergen Reactions  . Metformin     REACTION: diarrhea  . Sitagliptin Phosphate     REACTION: abdominal pain   Family History  Problem Relation Age of Onset  . Deep vein thrombosis Mother   . COPD Father   . Asthma Father   . Diabetes Neg Hx        family hx  . Cancer Neg Hx        breast ca - sister(S)?  Marland Kitchen Heart disease Neg Hx        family hx     PE: There were no vitals taken for this visit. There is no height or weight on file to calculate BMI. Wt Readings from Last 3 Encounters:  12/12/17 175 lb (79.4 kg)  10/05/17 174 lb 12.8 oz (79.3 kg)  07/06/17 173 lb 12.8 oz (78.8 kg)   Constitutional: overweight, in NAD Eyes: PERRLA, EOMI, no exophthalmos ENT: moist mucous membranes, no thyromegaly, no cervical lymphadenopathy Cardiovascular: RRR, No RG, + 2/6 SEM Respiratory: CTA B Gastrointestinal: abdomen soft, NT, ND, BS+ Musculoskeletal: no deformities, strength intact in all 4 Skin: moist, warm, no rashes Neurological: no tremor with outstretched hands, DTR normal in all 4   ASSESSMENT: 1. DM2, insulin-dependent, uncontrolled, without complications - + nephropathy  We have tried to stop Amaryl,  but sugars were higher >> we restarted it.  2. HL  PLAN:  1. Patient with long standing, uncontrolled, type 2 diabetes, on a complex regimen containing oral medication, basal-bolus insulin regimen, and GLP-1 receptor agonist.  Trulicity dose was increased at last visit as she was still having increased sugars throughout the day in a stepwise fashion..  This is still expensive for her, but her daughter is helping her with the expenses.  She is tolerating the medication very well. - at last visit, as she was having  low CBGs in am, we decreased Basaglar dose in parallel with increasing Trulicity dose - I advised her to:   Patient Instructions  Please continue:  - Amaryl 4 mg 2x a day, before meals. - Trulicity 1.5 mg weekly - Basaglar 38 units at bedtime - NovoLog   20-22 units with a smaller meal  24-26 units with a regular meal  28-30 units for a larger meal  Please return in 3-4 months with your sugar log  - today, HbA1c is 7%  - continue checking sugars at different times of the day - check 3x a day, rotating checks - advised for yearly eye exams >> she is UTD - Return to clinic in 3 mo with sugar log      2. HL - Reviewed latest lipid panel from 05/2017: LDL and TG above goal Lab Results  Component Value Date   CHOL 203 (H) 06/01/2017   HDL 48.40 06/01/2017   LDLDIRECT 118.0 06/01/2017   TRIG 336.0 (H) 06/01/2017   CHOLHDL 4 06/01/2017  - Continues Zocor without side effects.  Philemon Kingdom, MD PhD [facility]

## 2018-02-28 ENCOUNTER — Other Ambulatory Visit: Payer: Self-pay

## 2018-02-28 MED ORDER — SIMVASTATIN 40 MG PO TABS: ORAL_TABLET | ORAL | 1 refills | Status: DC

## 2018-03-01 ENCOUNTER — Other Ambulatory Visit: Payer: Self-pay

## 2018-03-01 MED ORDER — SIMVASTATIN 40 MG PO TABS: ORAL_TABLET | ORAL | 1 refills | Status: DC

## 2018-03-14 ENCOUNTER — Ambulatory Visit: Payer: 59 | Admitting: Endocrinology

## 2018-03-15 ENCOUNTER — Encounter: Payer: Self-pay | Admitting: Internal Medicine

## 2018-03-15 ENCOUNTER — Ambulatory Visit: Payer: Medicare HMO | Admitting: Internal Medicine

## 2018-03-15 VITALS — BP 130/88 | HR 102 | Ht 64.5 in | Wt 172.0 lb

## 2018-03-15 DIAGNOSIS — E1121 Type 2 diabetes mellitus with diabetic nephropathy: Secondary | ICD-10-CM

## 2018-03-15 DIAGNOSIS — Z794 Long term (current) use of insulin: Secondary | ICD-10-CM

## 2018-03-15 DIAGNOSIS — E7800 Pure hypercholesterolemia, unspecified: Secondary | ICD-10-CM

## 2018-03-15 DIAGNOSIS — E663 Overweight: Secondary | ICD-10-CM | POA: Insufficient documentation

## 2018-03-15 DIAGNOSIS — Z23 Encounter for immunization: Secondary | ICD-10-CM

## 2018-03-15 LAB — POCT GLYCOSYLATED HEMOGLOBIN (HGB A1C): HEMOGLOBIN A1C: 6.6 % — AB (ref 4.0–5.6)

## 2018-03-15 NOTE — Progress Notes (Addendum)
Patient ID: [name] [name], female   DOB: Nov 27, 1928, 84 y.o.   MRN: [id number]  HPI: [name] [name] is a 84 y.o.-year-old female, returning for f/u for DM2, dx 2005, insulin-dependent, uncontrolled, with complications (nephropathy). Last visit 3 months ago. Ins.>> now [employer].   Last hemoglobin A1c was: Lab Results  Component Value Date   HGBA1C 7.2 (A) 12/12/2017   HGBA1C 8.4% 10/05/2017   HGBA1C 8.3 07/06/2017   HGBA1C 8.3 02/24/2017   HGBA1C 8.4 11/15/2016   HGBA1C 8.7 08/18/2016   HGBA1C 9.4 03/29/2016   HGBA1C 7.8 12/28/2015   HGBA1C 8.4 09/22/2015   HGBA1C 9.1 (H) 06/08/2015   Pt is on a regimen of:  - Amaryl 4 mg 2x a day, before meals. - Trulicity 1.5 mg weekly -this is expensive, but daughter is helping her with the expense - Basaglar 38 units at bedtime - NovoLog   20-22 units with a smaller meal  24-26 units with a regular meal  28-30 units for a larger meal We had to stop Metformin 2/2 increased creatinine. We stopped Amaryl at last visit.  Pt checks her sugars twice a day: - am: 100-200 >> 71, 77-159, 162, 171 >> 81, 95, 113-162, 170 - 2h after b'fast: 163-282 >> n/c >> 98-185 >> n/c >> 166 - lunch:  142-190 >> n/c >> 150-180 >> 126 >> 196, 265 - 2h after lunch: 161, 181 >> 120-150 >> n/c  - before dinner: 180, 192 >> n/c >> 217 >> n/c - later: 150-160 >> 200-260 >> 126, 146-280 >> 166-265 Lowest sugar was 99 >> 126 >> 71 >> 95 . She has hypoglycemia awareness in the 70s. Highest 300 >> 331 >> 291.  + CKD stage III-IV; last BUN/Cr: Lab Results  Component Value Date   BUN 26 (H) 06/01/2017   CREATININE 1.50 (H) 06/01/2017   + HL; last set of lipids: Lab Results  Component Value Date   CHOL 203 (H) 06/01/2017   HDL 48.40 06/01/2017   LDLDIRECT 118.0 06/01/2017   TRIG 336.0 (H) 06/01/2017   CHOLHDL 4 06/01/2017  On Zocor 20. Pt's last eye exam was in 12/2016: No DR.-  Dr. Susa Loffler at [facility].  No numbness  and tingling in her feet  ROS: Constitutional: no weight gain/no weight loss, + fatigue, no subjective hyperthermia, no subjective hypothermia Eyes: no blurry vision, no xerophthalmia ENT: no sore throat, no nodules palpated in throat, no dysphagia, no odynophagia, no hoarseness Cardiovascular: no CP/no SOB/no palpitations/no leg swelling Respiratory: no cough/no SOB/no wheezing Gastrointestinal: no N/no V/no D/no C/no acid reflux Musculoskeletal: no muscle aches/no joint aches Skin: no rashes, no hair loss Neurological: no tremors/no numbness/no tingling/no dizziness  I reviewed pt's medications, allergies, PMH, social hx, family hx, and changes were documented in the history of present illness. Otherwise, unchanged from my initial visit note.  Past Medical History:  Diagnosis Date  . Asthma   . DEGENERATIVE JOINT DISEASE, GENERALIZED 02/01/2007   Qualifier: Diagnosis of  By: Burnice Logan  MD, Doretha Sou   . DIARRHEA, CHRONIC 08/20/2009   Qualifier: Diagnosis of  By: Burnice Logan  MD, Doretha Sou   . DM 02/01/2007   Qualifier: Diagnosis of  By: Floyde Parkins    . FATIGUE 10/20/2008   Qualifier: Diagnosis of  By: Burnice Logan  MD, Doretha Sou   . HYPERCHOLESTEROLEMIA 02/01/2007   Qualifier: Diagnosis of  By: Floyde Parkins    . HYPERLIPIDEMIA 12/20/2007   Qualifier: Diagnosis of  By: Burnice Logan  MD, Doretha Sou   . HYPERTENSION 02/01/2007   Qualifier: Diagnosis of  By: Floyde Parkins    . LOW BACK PAIN 01/13/2010   Qualifier: Diagnosis of  By: Burnice Logan  MD, Doretha Sou MUSCLE CRAMPS 04/06/2010   Qualifier: Diagnosis of  By: Burnice Logan  MD, Doretha Sou   . MYALGIA 01/20/2010   Qualifier: Diagnosis of  By: Elease Hashimoto MD, [place]    . OSTEOARTHRITIS 01/13/2010   Qualifier: Diagnosis of  By: Burnice Logan  MD, Doretha Sou   . OVERACTIVE BLADDER 08/20/2009   Qualifier: Diagnosis of  By: Burnice Logan  MD, Doretha Sou   . PLEURISY 06/18/2008   Qualifier: Diagnosis of  By: Niel Hummer MD, [place] PNEUMONIA 06/18/2008    Qualifier: Diagnosis of  By: Niel Hummer MD, [place] Rectal prolapse 09/20/2007   Qualifier: Diagnosis of  By: Burnice Logan  MD, Doretha Sou   . VITAMIN B12 DEFICIENCY 07/20/2010   Qualifier: Diagnosis of  By: Carlean Purl MD, Dimas Millin    Past Surgical History:  Procedure Laterality Date  . ABDOMINAL HYSTERECTOMY     partial- states she has her ovaries  . APPENDECTOMY    . BLADDER SURGERY    . CATARACT EXTRACTION Bilateral 2012  . CHOLECYSTECTOMY     Social History   Socioeconomic History  . Marital status: Single    Spouse name: Not on file  . Number of children: Not on file  . Years of education: Not on file  . Highest education level: Not on file  Occupational History  . Not on file  Social Needs  . Financial resource strain: Not on file  . Food insecurity:    Worry: Not on file    Inability: Not on file  . Transportation needs:    Medical: Not on file    Non-medical: Not on file  Tobacco Use  . Smoking status: Former Smoker    Packs/day: 10.00  . Smokeless tobacco: Never Used  . Tobacco comment: quit > 4 yo   Substance and Sexual Activity  . Alcohol use: No  . Drug use: No  . Sexual activity: Not on file  Lifestyle  . Physical activity:    Days per week: Not on file    Minutes per session: Not on file  . Stress: Not on file  Relationships  . Social connections:    Talks on phone: Not on file    Gets together: Not on file    Attends religious service: Not on file    Active member of club or organization: Not on file    Attends meetings of clubs or organizations: Not on file    Relationship status: Not on file  . Intimate partner violence:    Fear of current or ex partner: Not on file    Emotionally abused: Not on file    Physically abused: Not on file    Forced sexual activity: Not on file  Other Topics Concern  . Not on file  Social History Narrative  . Not on file   Current Outpatient Medications on File Prior to Visit  Medication Sig Dispense  Refill  . Acetaminophen 650 MG TABS Take 1 tablet (650 mg total) by mouth 3 (three) times daily as needed. 30 tablet 0  . aspirin 81 MG tablet Take 81 mg by mouth daily.      . calcium-vitamin D (OSCAL WITH D 500-200) 500-200 MG-UNIT per tablet Take 1 tablet by  mouth daily.      . cyanocobalamin 1000 MCG tablet Take 100 mcg by mouth daily.      . diphenoxylate-atropine (LOMOTIL) 2.5-0.025 MG tablet Take 1 tablet by mouth 4 (four) times daily as needed for diarrhea or loose stools. Take 2 tablets initially then take 1 tablet every 4 hours as needed for diarrhea. 30 tablet 0  . Dulaglutide (TRULICITY) 1.5 GU/5.4YH SOPN Inject 1.5 mg into the skin once a week. 4 pen 11  . fluticasone (FLONASE) 50 MCG/ACT nasal spray Place 1 spray into the nose daily. 16 g 0  . glimepiride (AMARYL) 4 MG tablet TAKE 1 TABLET BY MOUTH TWICE DAILY 180 tablet 0  . glucose blood (ONE TOUCH ULTRA TEST) test strip Use as instructed to test 3 times daily DX: E11.21 300 each 5  . hydrochlorothiazide (HYDRODIURIL) 25 MG tablet Take 1 tablet (25 mg total) by mouth daily. 90 tablet 3  . HYDROcodone-homatropine (HYCODAN) 5-1.5 MG/5ML syrup Take 2.5 mLs by mouth every 6 (six) hours as needed for cough. 180 mL 0  . ibuprofen (ADVIL,MOTRIN) 600 MG tablet TAKE 1 TABLET BY MOUTH EVERY 6 HOURS AS NEEDED FOR PAIN 60 tablet 1  . insulin aspart (NOVOLOG FLEXPEN) 100 UNIT/ML FlexPen Inject 20-30 Units into the skin 3 (three) times daily with meals. 45 mL 3  . Insulin Glargine (BASAGLAR KWIKPEN) 100 UNIT/ML SOPN Inject 0.38 mLs (38 Units total) into the skin at bedtime. 5 pen 3  . Insulin Pen Needle (BD PEN NEEDLE NANO U/F) 32G X 4 MM MISC USE TO INJECT INSULIN 4 TIMES DAILY AS DIRECTED. 130 each 11  . Lancets (ONETOUCH ULTRASOFT) lancets Use as instructed to test 3 times daily 100 each 12  . Multiple Vitamin (MULTIVITAMIN) tablet Take 1 tablet by mouth daily.      . simvastatin (ZOCOR) 40 MG tablet TAKE 1/2 (ONE-HALF) TABLET BY MOUTH AT  BEDTIME 45 tablet 1  . traZODone (DESYREL) 150 MG tablet TAKE 1 TABLET BY MOUTH AT BEDTIME AS NEEDED 90 tablet 3  . venlafaxine XR (EFFEXOR-XR) 75 MG 24 hr capsule TAKE 1 CAPSULE BY MOUTH ONCE DAILY 90 capsule 1   No current facility-administered medications on file prior to visit.    Allergies  Allergen Reactions  . Metformin     REACTION: diarrhea  . Sitagliptin Phosphate     REACTION: abdominal pain   Family History  Problem Relation Age of Onset  . Deep vein thrombosis Mother   . COPD Father   . Asthma Father   . Diabetes Neg Hx        family hx  . Cancer Neg Hx        breast ca - sister(S)?  Marland Kitchen Heart disease Neg Hx        family hx   PE: BP (!) 150/80   Pulse (!) 102   Ht 5' 4.5" (1.638 m)   Wt 172 lb (78 kg)   SpO2 95%   BMI 29.07 kg/m  Body mass index is 29.07 kg/m. Wt Readings from Last 3 Encounters:  03/15/18 172 lb (78 kg)  12/12/17 175 lb (79.4 kg)  10/05/17 174 lb 12.8 oz (79.3 kg)   Constitutional: overweight, in NAD Eyes: PERRLA, EOMI, no exophthalmos ENT: moist mucous membranes, no thyromegaly, no cervical lymphadenopathy Cardiovascular: Tachycardia, RR, No RG, +2/6 SEM Respiratory: CTA B Gastrointestinal: abdomen soft, NT, ND, BS+ Musculoskeletal: no deformities, strength intact in all 4 Skin: moist, warm, no rashes Neurological: no tremor with outstretched  hands, DTR normal in all 4   ASSESSMENT: 1. DM2, insulin-dependent, uncontrolled, without complications - + nephropathy  We have tried to stop Amaryl, but sugars were higher >> we restarted it.  2. HL  3.  Overweight  PLAN:  1. Patient with long-standing, uncontrolled, type 2 diabetes, on a complex diabetes regimen containing oral medications, basal-bolus insulin regimen, and GLP-1 receptor agonist.  At this visit, sugars are approximately the same as before, still high at bedtime.  Upon questioning, she did not take the recommended dose of NovoLog, only taking 20 units with meals rather  than 20-30 depending on the size of the meal.  At this visit, I advised her to take 22 to 24 units with a regular meal and to increase to 28 units if she has a larger meal if she has dessert.  She is taking 40 units of Basaglar at bedtime but she is dropping her sugars a little too much from bedtime to a.m., so I advised her to only take the recommended 38 units. -No other changes are needed for now. - I advised her to:   Patient Instructions  Please continue:  - Amaryl 4 mg 2x a day, before meals. - Trulicity 1.5 mg weekly - Basaglar 38 units at bedtime - NovoLog   20-22 units with a smaller meal  24-26 units with a regular meal  28-30 units for a larger meal  Please return in 4 months with your sugar log  - today, HbA1c is 6.6% (improved) - continue checking sugars at different times of the day - check 3x a day, rotating checks - advised for yearly eye exams >> she is UTD, and will have another appointment this week - Given flu shot today - Return to clinic in 4 mo with sugar log       2. HL - Reviewed latest lipid panel from 05/2017: LDL and triglycerides above goal Lab Results  Component Value Date   CHOL 203 (H) 06/01/2017   HDL 48.40 06/01/2017   LDLDIRECT 118.0 06/01/2017   TRIG 336.0 (H) 06/01/2017   CHOLHDL 4 06/01/2017  - Continues Zocor without side effects.  3.  Overweight - Weight is down by 3 pounds since last visit. - Continue Trulicity, which should also help with weight loss  Philemon Kingdom, MD PhD [facility]

## 2018-03-15 NOTE — Patient Instructions (Addendum)
Please continue:  - Amaryl 4 mg 2x a day, before meals. - Trulicity 1.5 mg weekly  Please change: - Basaglar 38 units at bedtime - NovoLog   20 units with a smaller meal  22-24 units with a regular meal  26-28 units for a larger meal  Please return in 4 months with your sugar log

## 2018-03-15 NOTE — Addendum Note (Signed)
Addended by: Cardell Peach I on: 03/15/2018 04:03 PM   Modules accepted: Orders

## 2018-03-29 ENCOUNTER — Other Ambulatory Visit: Payer: Self-pay | Admitting: Internal Medicine

## 2018-04-04 DIAGNOSIS — E119 Type 2 diabetes mellitus without complications: Secondary | ICD-10-CM | POA: Diagnosis not present

## 2018-04-04 DIAGNOSIS — Z961 Presence of intraocular lens: Secondary | ICD-10-CM | POA: Diagnosis not present

## 2018-04-04 DIAGNOSIS — H52203 Unspecified astigmatism, bilateral: Secondary | ICD-10-CM | POA: Diagnosis not present

## 2018-04-25 ENCOUNTER — Telehealth: Payer: Self-pay | Admitting: Family Medicine

## 2018-04-25 NOTE — Telephone Encounter (Signed)
Copied from [place] [id number]. Topic: Appointment Scheduling - Scheduling Inquiry for Clinic >> Apr 17, 2018 12:03 PM Antonieta Iba C wrote: Reason for CRM: pt's called in to schedule her CPE visit. Pt says that she discussed TOC to [facility] w/ Dr. Raliegh Ip. Showing that pt has not had her TOC apt at this time. Offered providers next available, pt would like to know if provider is able to see her sooner then his next available?   Please advise/assist  CB: [phone]

## 2018-04-26 NOTE — Telephone Encounter (Signed)
That is fine but won't be able to do both in the same visit

## 2018-04-26 NOTE — Telephone Encounter (Signed)
Pt scheduled.     No further action required.

## 2018-05-06 ENCOUNTER — Other Ambulatory Visit: Payer: Self-pay | Admitting: Internal Medicine

## 2018-05-08 ENCOUNTER — Ambulatory Visit (INDEPENDENT_AMBULATORY_CARE_PROVIDER_SITE_OTHER): Payer: Medicare HMO | Admitting: Adult Health

## 2018-05-08 ENCOUNTER — Encounter: Payer: Self-pay | Admitting: Adult Health

## 2018-05-08 VITALS — BP 140/84 | Temp 98.5°F | Wt 171.0 lb

## 2018-05-08 DIAGNOSIS — F5101 Primary insomnia: Secondary | ICD-10-CM

## 2018-05-08 DIAGNOSIS — E1121 Type 2 diabetes mellitus with diabetic nephropathy: Secondary | ICD-10-CM

## 2018-05-08 DIAGNOSIS — I10 Essential (primary) hypertension: Secondary | ICD-10-CM | POA: Diagnosis not present

## 2018-05-08 DIAGNOSIS — Z7689 Persons encountering health services in other specified circumstances: Secondary | ICD-10-CM | POA: Diagnosis not present

## 2018-05-08 DIAGNOSIS — Z794 Long term (current) use of insulin: Secondary | ICD-10-CM

## 2018-05-08 DIAGNOSIS — E7800 Pure hypercholesterolemia, unspecified: Secondary | ICD-10-CM

## 2018-05-08 DIAGNOSIS — R69 Illness, unspecified: Secondary | ICD-10-CM | POA: Diagnosis not present

## 2018-05-08 MED ORDER — DIPHENOXYLATE-ATROPINE 2.5-0.025 MG PO TABS: 1.0000 | ORAL_TABLET | Freq: Three times a day (TID) | ORAL | 0 refills | Status: DC | PRN

## 2018-05-08 NOTE — Patient Instructions (Signed)
It was great seeing you today   Please follow up for your physical after 11/29  Go ahead and try Melatonin, between 3-5 mg. This can be bought over the counter. Take it 30 minutes before bedtime

## 2018-05-08 NOTE — Progress Notes (Signed)
Patient presents to clinic today to establish care. She is a pleasant 84 year old female who  has a past medical history of Asthma, DEGENERATIVE JOINT DISEASE, GENERALIZED (02/01/2007), DIARRHEA, CHRONIC (08/20/2009), DM (02/01/2007), FATIGUE (10/20/2008), HYPERCHOLESTEROLEMIA (02/01/2007), HYPERLIPIDEMIA (12/20/2007), HYPERTENSION (02/01/2007), LOW BACK PAIN (01/13/2010), MUSCLE CRAMPS (04/06/2010), MYALGIA (01/20/2010), OSTEOARTHRITIS (01/13/2010), OVERACTIVE BLADDER (08/20/2009), PLEURISY (06/18/2008), PNEUMONIA (06/18/2008), Rectal prolapse (09/20/2007), and VITAMIN B12 DEFICIENCY (07/20/2010).   She lives in a mobile home and is close to her son.   She is a former patient of Dr. Raliegh Ip who is transferring care  Her last CPE was on 06/01/2017   Acute Concerns: Establish Care   Chronic Issues: DM Type 2 -is by endocrinology.  Current treatment includes Amaryl 4 mg 2 times a day before meals, Trulicity 1.5 mg weekly, Basaglar 38 units nightly, and NovoLog (units with small meal, 22 to 24 units with regular meal, and 26 to 28 units for a larger meal) Lab Results  Component Value Date   HGBA1C 6.6 (A) 03/15/2018   Hyperlipidemia - takes stain  Lab Results  Component Value Date   CHOL 203 (H) 06/01/2017   HDL 48.40 06/01/2017   LDLDIRECT 118.0 06/01/2017   TRIG 336.0 (H) 06/01/2017   CHOLHDL 4 06/01/2017   Anxiety - Takes Effexor 75 mg. Feels as though it is well controlled.   Insomnia - Takes Trazodone but does not feel as though it works well.   Essential Hypertension - Controlled with HCTZ 25 mg  BP Readings from Last 3 Encounters:  05/08/18 140/84  03/15/18 130/88  12/12/17 130/60   Health Maintenance: Dental -- Does not see a dentist  Vision -- Routine Care  Immunizations -- UTD Colonoscopy -- No longer needs Mammogram -- No longer needs PAP -- No longer needs Bone Density -- Unknown    Past Medical History:  Diagnosis Date  . Asthma   . DEGENERATIVE JOINT DISEASE,  GENERALIZED 02/01/2007   Qualifier: Diagnosis of  By: Burnice Logan  MD, Doretha Sou   . DIARRHEA, CHRONIC 08/20/2009   Qualifier: Diagnosis of  By: Burnice Logan  MD, Doretha Sou   . DM 02/01/2007   Qualifier: Diagnosis of  By: Floyde Parkins    . FATIGUE 10/20/2008   Qualifier: Diagnosis of  By: Burnice Logan  MD, Doretha Sou   . HYPERCHOLESTEROLEMIA 02/01/2007   Qualifier: Diagnosis of  By: Floyde Parkins    . HYPERLIPIDEMIA 12/20/2007   Qualifier: Diagnosis of  By: Burnice Logan  MD, Doretha Sou   . HYPERTENSION 02/01/2007   Qualifier: Diagnosis of  By: Floyde Parkins    . LOW BACK PAIN 01/13/2010   Qualifier: Diagnosis of  By: Burnice Logan  MD, Doretha Sou MUSCLE CRAMPS 04/06/2010   Qualifier: Diagnosis of  By: Burnice Logan  MD, Doretha Sou   . MYALGIA 01/20/2010   Qualifier: Diagnosis of  By: Elease Hashimoto MD, [place]    . OSTEOARTHRITIS 01/13/2010   Qualifier: Diagnosis of  By: Burnice Logan  MD, Doretha Sou   . OVERACTIVE BLADDER 08/20/2009   Qualifier: Diagnosis of  By: Burnice Logan  MD, Doretha Sou   . PLEURISY 06/18/2008   Qualifier: Diagnosis of  By: Niel Hummer MD, [place] PNEUMONIA 06/18/2008   Qualifier: Diagnosis of  By: Niel Hummer MD, [place] Rectal prolapse 09/20/2007   Qualifier: Diagnosis of  By: Burnice Logan  MD, Doretha Sou   . VITAMIN B12 DEFICIENCY 07/20/2010   Qualifier: Diagnosis of  By: Carlean Purl MD, Tonna Boehringer  E     Past Surgical History:  Procedure Laterality Date  . ABDOMINAL HYSTERECTOMY     partial- states she has her ovaries  . APPENDECTOMY    . BLADDER SURGERY    . CATARACT EXTRACTION Bilateral 2012  . CHOLECYSTECTOMY      Current Outpatient Medications on File Prior to Visit  Medication Sig Dispense Refill  . Acetaminophen 650 MG TABS Take 1 tablet (650 mg total) by mouth 3 (three) times daily as needed. 30 tablet 0  . aspirin 81 MG tablet Take 81 mg by mouth daily.      . calcium-vitamin D (OSCAL WITH D 500-200) 500-200 MG-UNIT per tablet Take 1 tablet by mouth daily.      . cyanocobalamin 1000 MCG  tablet Take 100 mcg by mouth daily.      . diphenoxylate-atropine (LOMOTIL) 2.5-0.025 MG tablet Take 1 tablet by mouth 4 (four) times daily as needed for diarrhea or loose stools. Take 2 tablets initially then take 1 tablet every 4 hours as needed for diarrhea. 30 tablet 0  . Dulaglutide (TRULICITY) 1.5 GY/6.5LD SOPN Inject 1.5 mg into the skin once a week. 4 pen 11  . fluticasone (FLONASE) 50 MCG/ACT nasal spray Place 1 spray into the nose daily. 16 g 0  . glimepiride (AMARYL) 4 MG tablet TAKE 1 TABLET BY MOUTH TWICE DAILY 180 tablet 0  . glucose blood (ONE TOUCH ULTRA TEST) test strip Use as instructed to test 3 times daily DX: E11.21 300 each 5  . hydrochlorothiazide (HYDRODIURIL) 25 MG tablet Take 1 tablet (25 mg total) by mouth daily. 90 tablet 3  . HYDROcodone-homatropine (HYCODAN) 5-1.5 MG/5ML syrup Take 2.5 mLs by mouth every 6 (six) hours as needed for cough. 180 mL 0  . ibuprofen (ADVIL,MOTRIN) 600 MG tablet TAKE 1 TABLET BY MOUTH EVERY 6 HOURS AS NEEDED FOR PAIN 60 tablet 1  . insulin aspart (NOVOLOG FLEXPEN) 100 UNIT/ML FlexPen Inject 20-30 Units into the skin 3 (three) times daily with meals. 45 mL 3  . Insulin Glargine (BASAGLAR KWIKPEN) 100 UNIT/ML SOPN Inject 0.38 mLs (38 Units total) into the skin at bedtime. 5 pen 3  . Insulin Glargine (BASAGLAR KWIKPEN) 100 UNIT/ML SOPN Inject 0.38 mLs (38 Units total) into the skin at bedtime. 15 pen 3  . Insulin Pen Needle (BD PEN NEEDLE NANO U/F) 32G X 4 MM MISC USE TO INJECT INSULIN 4 TIMES DAILY AS DIRECTED. 130 each 11  . Lancets (ONETOUCH ULTRASOFT) lancets Use as instructed to test 3 times daily 100 each 12  . Multiple Vitamin (MULTIVITAMIN) tablet Take 1 tablet by mouth daily.      . simvastatin (ZOCOR) 40 MG tablet TAKE 1/2 (ONE-HALF) TABLET BY MOUTH AT BEDTIME 45 tablet 1  . traZODone (DESYREL) 150 MG tablet TAKE 1 TABLET BY MOUTH AT BEDTIME AS NEEDED 90 tablet 3  . venlafaxine XR (EFFEXOR-XR) 75 MG 24 hr capsule TAKE 1 CAPSULE BY MOUTH  ONCE DAILY 90 capsule 1   No current facility-administered medications on file prior to visit.     Allergies  Allergen Reactions  . Metformin     REACTION: diarrhea  . Sitagliptin Phosphate     REACTION: abdominal pain    Family History  Problem Relation Age of Onset  . Deep vein thrombosis Mother   . COPD Father   . Asthma Father   . Diabetes Neg Hx        family hx  . Cancer Neg Hx  breast ca - sister(S)?  Marland Kitchen Heart disease Neg Hx        family hx    Social History   Socioeconomic History  . Marital status: Single    Spouse name: Not on file  . Number of children: Not on file  . Years of education: Not on file  . Highest education level: Not on file  Occupational History  . Not on file  Social Needs  . Financial resource strain: Not on file  . Food insecurity:    Worry: Not on file    Inability: Not on file  . Transportation needs:    Medical: Not on file    Non-medical: Not on file  Tobacco Use  . Smoking status: Former Smoker    Packs/day: 10.00  . Smokeless tobacco: Never Used  . Tobacco comment: quit > 24 yo   Substance and Sexual Activity  . Alcohol use: No  . Drug use: No  . Sexual activity: Not on file  Lifestyle  . Physical activity:    Days per week: Not on file    Minutes per session: Not on file  . Stress: Not on file  Relationships  . Social connections:    Talks on phone: Not on file    Gets together: Not on file    Attends religious service: Not on file    Active member of club or organization: Not on file    Attends meetings of clubs or organizations: Not on file    Relationship status: Not on file  . Intimate partner violence:    Fear of current or ex partner: Not on file    Emotionally abused: Not on file    Physically abused: Not on file    Forced sexual activity: Not on file  Other Topics Concern  . Not on file  Social History Narrative  . Not on file    Review of Systems  HENT: Negative.   Respiratory: Negative.    Cardiovascular: Negative.   Genitourinary: Negative.   Musculoskeletal: Negative.   Neurological: Negative.   Psychiatric/Behavioral: The patient has insomnia.   All other systems reviewed and are negative.   There were no vitals taken for this visit.  Physical Exam  Constitutional: She is oriented to person, place, and time. She appears well-developed and well-nourished. No distress.  HENT:  Head: Normocephalic and atraumatic.  Eyes: Pupils are equal, round, and reactive to light. Conjunctivae and EOM are normal.  Cardiovascular: Normal rate, regular rhythm, normal heart sounds and intact distal pulses.  Pulmonary/Chest: Effort normal and breath sounds normal.  Abdominal: Soft. Bowel sounds are normal.  Musculoskeletal: Normal range of motion. She exhibits no edema, tenderness or deformity.  Neurological: She is alert and oriented to person, place, and time.  Skin: Skin is warm and dry. She is not diaphoretic.  Vitals reviewed.   Recent Results (from the past 2160 hour(s))  POCT glycosylated hemoglobin (Hb A1C)     Status: Abnormal   Collection Time: 03/15/18  4:02 PM  Result Value Ref Range   Hemoglobin A1C 6.6 (A) 4.0 - 5.6 %   HbA1c POC (<> result, manual entry)     HbA1c, POC (prediabetic range)     HbA1c, POC (controlled diabetic range)      Assessment/Plan:  1. Encounter to establish care - Follow up for CPE or sooner if needed  2. Essential hypertension -  Well controlled - No change in medications   3. Type 2 diabetes mellitus  with diabetic nephropathy, with long-term current use of insulin ([place]) - Continue with Plan of care by endocrinology   4. HYPERCHOLESTEROLEMIA - Will check lipid panel at CPE   5. Primary insomnia - Will have her try Melatonin 3-5 mg QHS  - Follow up as needed   Dorothyann Peng, NP

## 2018-06-25 ENCOUNTER — Other Ambulatory Visit: Payer: Self-pay | Admitting: Internal Medicine

## 2018-07-06 NOTE — Telephone Encounter (Signed)
Pt called and stated she has not heard anything regarding a refill for hydrochlorothiazide (HYDRODIURIL) 25 MG tablet. Please advise. Original request sent on 06/25/18.  [facility] ([address]), [place] [phone] (Phone) [phone] (Fax)   Former pt of Dr. Raliegh Ip Now established with Dorothyann Peng

## 2018-07-06 NOTE — Telephone Encounter (Signed)
Please review for medication refill for hydrochlorothiazide 25 mg tab. Former pt of D. K Was seen in office by C. Nafziger.  This prescription expired on 06/19/18. Last labs were done in 2018.

## 2018-07-08 ENCOUNTER — Other Ambulatory Visit: Payer: Self-pay | Admitting: Internal Medicine

## 2018-07-10 NOTE — Telephone Encounter (Signed)
Ok to fill for 90 days but needs follow up appointment and labs

## 2018-07-10 NOTE — Telephone Encounter (Signed)
Sent to the pharmacy by e-scribe.  Pt has been scheduled for cpx.

## 2018-07-16 ENCOUNTER — Encounter: Payer: Self-pay | Admitting: Internal Medicine

## 2018-07-16 ENCOUNTER — Ambulatory Visit (INDEPENDENT_AMBULATORY_CARE_PROVIDER_SITE_OTHER): Payer: Medicare HMO | Admitting: Internal Medicine

## 2018-07-16 VITALS — BP 128/80 | HR 102 | Ht 64.5 in | Wt 172.0 lb

## 2018-07-16 DIAGNOSIS — E1121 Type 2 diabetes mellitus with diabetic nephropathy: Secondary | ICD-10-CM | POA: Diagnosis not present

## 2018-07-16 DIAGNOSIS — E663 Overweight: Secondary | ICD-10-CM

## 2018-07-16 DIAGNOSIS — Z794 Long term (current) use of insulin: Secondary | ICD-10-CM

## 2018-07-16 DIAGNOSIS — E7800 Pure hypercholesterolemia, unspecified: Secondary | ICD-10-CM | POA: Diagnosis not present

## 2018-07-16 LAB — POCT GLYCOSYLATED HEMOGLOBIN (HGB A1C): Hemoglobin A1C: 6.6 % — AB (ref 4.0–5.6)

## 2018-07-16 MED ORDER — BASAGLAR KWIKPEN 100 UNIT/ML ~~LOC~~ SOPN: 32.0000 [IU] | PEN_INJECTOR | Freq: Every day | SUBCUTANEOUS | 3 refills | Status: DC

## 2018-07-16 NOTE — Patient Instructions (Addendum)
Please continue:  - Amaryl 4 mg 2x a day, before meals. - Trulicity 1.5 mg weekly  Please change: - Basaglar 32 units at bedtime - NovoLog   20-25 units before b'fast or lunch  26-28 units before dinner  Please return in 4 months with your sugar log

## 2018-07-16 NOTE — Progress Notes (Signed)
Patient ID: [name] [name], female   DOB: 02-12-1929, 85 y.o.   MRN: [id number]  HPI: [name] [name] is a 85 y.o.-year-old female, returning for f/u for DM2, dx 2005, insulin-dependent, uncontrolled, with complications (nephropathy). Last visit 4 months ago. Ins.>> now [employer].   Last hemoglobin A1c was: Lab Results  Component Value Date   HGBA1C 6.6 (A) 03/15/2018   HGBA1C 7.2 (A) 12/12/2017   HGBA1C 8.4% 10/05/2017   HGBA1C 8.3 07/06/2017   HGBA1C 8.3 02/24/2017   HGBA1C 8.4 11/15/2016   HGBA1C 8.7 08/18/2016   HGBA1C 9.4 03/29/2016   HGBA1C 7.8 12/28/2015   HGBA1C 8.4 09/22/2015   Pt is on a regimen of:  - Amaryl 4 mg 2x a day, before meals. - Trulicity 1.5 mg weekly -this is expensive but daughter is helping her with the expense - Basaglar 38 units at bedtime - NovoLog  - eats out a lot:  20-22 units with a smaller meal  (occasionally 25)   (not using) We had to stop Metformin 2/2 increased creatinine. We stopped Amaryl at last visit.  Pt checks her sugars twice a day per review of her log: - am: 71, 77-159, 162, 171 >> 81, 95, 113-162, 170 >> 96-139 - 2h after b'fast: 98-185 >> n/c >> 166 >> n/c - lunch:150-180 >> 126 >> 196, 265 >> n/c - 2h after lunch: 161, 181 >> 120-150 >> n/c  - before dinner: 180, 192 >> n/c >> 217 >> n/c - later:  126, 146-280 >> 166-265 >> 190-300 (snacks at night) Lowest sugar was 95 >> 96. She has hypoglycemia awareness in the 70s Highest 291 >> 300 (forgot medicines).  + CKD stage III-IV; last BUN/Cr: Lab Results  Component Value Date   BUN 26 (H) 06/01/2017   CREATININE 1.50 (H) 06/01/2017   + HL; last set of lipids: Lab Results  Component Value Date   CHOL 203 (H) 06/01/2017   HDL 48.40 06/01/2017   LDLDIRECT 118.0 06/01/2017   TRIG 336.0 (H) 06/01/2017   CHOLHDL 4 06/01/2017  On Zocor 20. Pt's last eye exam was in 2019: No DR.-  [facility] Susa L[facility]ler at [facility].  She denies numbness and  tingling in her feet  ROS: Constitutional: no weight gain/no weight loss, + fatigue, no subjective hyperthermia, no subjective hypothermia Eyes: no blurry vision, no xerophthalmia ENT: no sore throat, no nodules palpated in neck, no dysphagia, no odynophagia, no hoarseness Cardiovascular: no CP/no SOB/no palpitations/no leg swelling Respiratory: no cough/no SOB/no wheezing Gastrointestinal: no N/no V/no D/no C/no acid reflux Musculoskeletal: no muscle aches/no joint aches Skin: no rashes, no hair loss Neurological: no tremors/no numbness/no tingling/no dizziness  I reviewed pt's medications, allergies, PMH, social hx, family hx, and changes were documented in the history of present illness. Otherwise, unchanged from my initial visit note.  Past Medical History:  Diagnosis Date  . Asthma   . DEGENERATIVE JOINT DISEASE, GENERALIZED 02/01/2007   Qualifier: Diagnosis of  By: Burnice Logan  MD, Doretha Sou   . DIARRHEA, CHRONIC 08/20/2009   Qualifier: Diagnosis of  By: Burnice Logan  MD, Doretha Sou   . DM 02/01/2007   Qualifier: Diagnosis of  By: Floyde Parkins    . FATIGUE 10/20/2008   Qualifier: Diagnosis of  By: Burnice Logan  MD, Doretha Sou   . HYPERCHOLESTEROLEMIA 02/01/2007   Qualifier: Diagnosis of  By: Floyde Parkins    . HYPERLIPIDEMIA 12/20/2007   Qualifier: Diagnosis of  By: Burnice Logan  MD, Doretha Sou   .  HYPERTENSION 02/01/2007   Qualifier: Diagnosis of  By: Floyde Parkins    . LOW BACK PAIN 01/13/2010   Qualifier: Diagnosis of  By: Burnice Logan  MD, Doretha Sou MUSCLE CRAMPS 04/06/2010   Qualifier: Diagnosis of  By: Burnice Logan  MD, Doretha Sou   . MYALGIA 01/20/2010   Qualifier: Diagnosis of  By: Elease Hashimoto MD, [place]    . OSTEOARTHRITIS 01/13/2010   Qualifier: Diagnosis of  By: Burnice Logan  MD, Doretha Sou   . OVERACTIVE BLADDER 08/20/2009   Qualifier: Diagnosis of  By: Burnice Logan  MD, Doretha Sou   . PLEURISY 06/18/2008   Qualifier: Diagnosis of  By: Niel Hummer MD, [place] PNEUMONIA 06/18/2008   Qualifier:  Diagnosis of  By: Niel Hummer MD, [place] Rectal prolapse 09/20/2007   Qualifier: Diagnosis of  By: Burnice Logan  MD, Doretha Sou   . VITAMIN B12 DEFICIENCY 07/20/2010   Qualifier: Diagnosis of  By: Carlean Purl MD, Dimas Millin    Past Surgical History:  Procedure Laterality Date  . ABDOMINAL HYSTERECTOMY     partial- states she has her ovaries  . APPENDECTOMY    . BLADDER SURGERY    . CATARACT EXTRACTION Bilateral 2012  . CHOLECYSTECTOMY     Social History   Socioeconomic History  . Marital status: Single    Spouse name: Not on file  . Number of children: Not on file  . Years of education: Not on file  . Highest education level: Not on file  Occupational History  . Not on file  Social Needs  . Financial resource strain: Not on file  . Food insecurity:    Worry: Not on file    Inability: Not on file  . Transportation needs:    Medical: Not on file    Non-medical: Not on file  Tobacco Use  . Smoking status: Former Smoker    Packs/day: 10.00  . Smokeless tobacco: Never Used  . Tobacco comment: quit > 45 yo   Substance and Sexual Activity  . Alcohol use: No  . Drug use: No  . Sexual activity: Not on file  Lifestyle  . Physical activity:    Days per week: Not on file    Minutes per session: Not on file  . Stress: Not on file  Relationships  . Social connections:    Talks on phone: Not on file    Gets together: Not on file    Attends religious service: Not on file    Active member of club or organization: Not on file    Attends meetings of clubs or organizations: Not on file    Relationship status: Not on file  . Intimate partner violence:    Fear of current or ex partner: Not on file    Emotionally abused: Not on file    Physically abused: Not on file    Forced sexual activity: Not on file  Other Topics Concern  . Not on file  Social History Narrative   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    Married for 8 years, widowed for 22 years    Current Outpatient Medications on File  Prior to Visit  Medication Sig Dispense Refill  . Acetaminophen 650 MG TABS Take 1 tablet (650 mg total) by mouth 3 (three) times daily as needed. 30 tablet 0  . aspirin 81 MG tablet Take 81 mg by mouth daily.      . calcium-vitamin D (OSCAL WITH D 500-200) 500-200  MG-UNIT per tablet Take 1 tablet by mouth daily.      . cyanocobalamin 1000 MCG tablet Take 100 mcg by mouth daily.      . diphenoxylate-atropine (LOMOTIL) 2.5-0.025 MG tablet Take 1 tablet by mouth 3 (three) times daily as needed for diarrhea or loose stools. 90 tablet 0  . Dulaglutide (TRULICITY) 1.5 PN/3.6RW SOPN Inject 1.5 mg into the skin once a week. 4 pen 11  . fluticasone (FLONASE) 50 MCG/ACT nasal spray Place 1 spray into the nose daily. 16 g 0  . glimepiride (AMARYL) 4 MG tablet TAKE 1 TABLET BY MOUTH TWICE DAILY 180 tablet 0  . glucose blood (ONE TOUCH ULTRA TEST) test strip Use as instructed to test 3 times daily DX: E11.21 300 each 5  . hydrochlorothiazide (HYDRODIURIL) 25 MG tablet TAKE 1 TABLET BY MOUTH ONCE DAILY 90 tablet 0  . HYDROcodone-homatropine (HYCODAN) 5-1.5 MG/5ML syrup Take 2.5 mLs by mouth every 6 (six) hours as needed for cough. 180 mL 0  . ibuprofen (ADVIL,MOTRIN) 600 MG tablet TAKE 1 TABLET BY MOUTH EVERY 6 HOURS AS NEEDED FOR PAIN 60 tablet 1  . insulin aspart (NOVOLOG FLEXPEN) 100 UNIT/ML FlexPen Inject 20-30 Units into the skin 3 (three) times daily with meals. 45 mL 3  . Insulin Glargine (BASAGLAR KWIKPEN) 100 UNIT/ML SOPN Inject 0.38 mLs (38 Units total) into the skin at bedtime. 5 pen 3  . Insulin Glargine (BASAGLAR KWIKPEN) 100 UNIT/ML SOPN Inject 0.38 mLs (38 Units total) into the skin at bedtime. 15 pen 3  . Insulin Pen Needle (BD PEN NEEDLE NANO U/F) 32G X 4 MM MISC USE TO INJECT INSULIN 4 TIMES DAILY AS DIRECTED. 130 each 11  . Lancets (ONETOUCH ULTRASOFT) lancets Use as instructed to test 3 times daily 100 each 12  . Multiple Vitamin (MULTIVITAMIN) tablet Take 1 tablet by mouth daily.      .  simvastatin (ZOCOR) 40 MG tablet TAKE 1/2 (ONE-HALF) TABLET BY MOUTH AT BEDTIME 45 tablet 1  . traZODone (DESYREL) 150 MG tablet TAKE 1 TABLET BY MOUTH AT BEDTIME AS NEEDED 90 tablet 3  . venlafaxine XR (EFFEXOR-XR) 75 MG 24 hr capsule TAKE 1 CAPSULE BY MOUTH ONCE DAILY 90 capsule 1   No current facility-administered medications on file prior to visit.    Allergies  Allergen Reactions  . Metformin     REACTION: diarrhea  . Sitagliptin Phosphate     REACTION: abdominal pain   Family History  Problem Relation Age of Onset  . Deep vein thrombosis Mother   . COPD Father   . Asthma Father   . Diabetes Neg Hx        family hx  . Cancer Neg Hx        breast ca - sister(S)?  Marland Kitchen Heart disease Neg Hx        family hx   PE: BP 128/80   Pulse (!) 102   Ht 5' 4.5" (1.638 m) Comment: measured  Wt 172 lb (78 kg)   SpO2 98%   BMI 29.07 kg/m  Body mass index is 29.07 kg/m. Wt Readings from Last 3 Encounters:  07/16/18 172 lb (78 kg)  05/08/18 171 lb (77.6 kg)  03/15/18 172 lb (78 kg)   Constitutional: overweight, in NAD Eyes: PERRLA, EOMI, no exophthalmos ENT: moist mucous membranes, no thyromegaly, no cervical lymphadenopathy Cardiovascular: Tachycardia RR, No RG, + 2/6 SEM Respiratory: CTA B Gastrointestinal: abdomen soft, NT, ND, BS+ Musculoskeletal: no deformities, strength intact in  all 4 Skin: moist, warm, no rashes Neurological: no tremor with outstretched hands, DTR normal in all 4  ASSESSMENT: 1. DM2, insulin-dependent, uncontrolled, without complications - + nephropathy  We have tried to stop Amaryl, but sugars were higher >> we restarted it.  2. HL  3.  Overweight  PLAN:  1. Patient with longstanding, uncontrolled, type 2 diabetes, on the complex diabetes regimen containing the basal-bolus insulin regimen, sulfonylurea, and weekly GLP-1 receptor agonist.  We reviewed her sugar log together and the sugars in the morning are at goal, while the ones at night are  high.  She tells me that she does not usually eat much during the day but she eats a larger dinner (usually eating out with her son) and snacks afterwards.  She is still not using the NovoLog doses recommended so for now I advised her to increase insulin with dinner by several evenings.  In the meantime, we will reduce the dose of Basaglar since she has an abrupt CBG drops from bedtime to a.m.  We will continue Trulicity and Amaryl for now. -We did discuss about ideally eating less out. - I advised her to:   Patient Instructions  Please continue:  - Amaryl 4 mg 2x a day, before meals. - Trulicity 1.5 mg weekly  Please change: - Basaglar 32 units at bedtime - NovoLog   20-25 units before b'fast or lunch  26-28 units before dinner  Please return in 4 months with your sugar log  - today, HbA1c is 6.6% (stable) - continue checking sugars at different times of the day - check 3x a day, rotating checks - advised for yearly eye exams >> she is UTD - Has annual physical with PCP in 09/2018 - Return to clinic in 4 mo with sugar log       2. HL - Reviewed latest lipid panel from 05/2017: LDL and triglycerides above goal Lab Results  Component Value Date   CHOL 203 (H) 06/01/2017   HDL 48.40 06/01/2017   LDLDIRECT 118.0 06/01/2017   TRIG 336.0 (H) 06/01/2017   CHOLHDL 4 06/01/2017  - Continues Zocor without side effects.  3.  Overweight -Weight stable since last visit -Continue Trulicity which should also help with weight loss  Philemon Kingdom, MD PhD [facility]

## 2018-07-16 NOTE — Addendum Note (Signed)
Addended by: Cardell Peach I on: 07/16/2018 04:08 PM   Modules accepted: Orders

## 2018-08-13 ENCOUNTER — Other Ambulatory Visit: Payer: Self-pay | Admitting: Adult Health

## 2018-08-14 NOTE — Telephone Encounter (Signed)
Denied.  Filled on 07/10/2018 by Dr. Raliegh Ip.

## 2018-09-17 ENCOUNTER — Other Ambulatory Visit: Payer: Self-pay | Admitting: Adult Health

## 2018-09-17 ENCOUNTER — Telehealth: Payer: Self-pay

## 2018-09-17 MED ORDER — INSULIN GLARGINE 100 UNITS/ML SOLOSTAR PEN: 32.0000 [IU] | PEN_INJECTOR | Freq: Every day | SUBCUTANEOUS | 11 refills | Status: DC

## 2018-09-17 MED ORDER — INSULIN ASPART 100 UNIT/ML FLEXPEN: 20.0000 [IU] | PEN_INJECTOR | Freq: Three times a day (TID) | SUBCUTANEOUS | 3 refills | Status: DC

## 2018-09-17 MED ORDER — DULAGLUTIDE 1.5 MG/0.5ML ~~LOC~~ SOAJ: 1.5000 mg | SUBCUTANEOUS | 11 refills | Status: DC

## 2018-09-17 NOTE — Telephone Encounter (Signed)
Received notification from [facility] that [name] [name] is no longer covered under insurance.  Please advise.

## 2018-09-17 NOTE — Telephone Encounter (Signed)
Let's try Lantus the same dose.

## 2018-09-17 NOTE — Telephone Encounter (Signed)
Patient notified

## 2018-09-17 NOTE — Telephone Encounter (Signed)
RX sent

## 2018-09-18 NOTE — Telephone Encounter (Signed)
Sent to the pharmacy by e-scribe for 90 days.  Pt scheduled for cpx 10/02/2018.

## 2018-10-02 ENCOUNTER — Encounter: Payer: Self-pay | Admitting: Adult Health

## 2018-10-14 ENCOUNTER — Other Ambulatory Visit: Payer: Self-pay | Admitting: Internal Medicine

## 2018-11-14 ENCOUNTER — Other Ambulatory Visit: Payer: Self-pay | Admitting: Family Medicine

## 2018-11-14 ENCOUNTER — Other Ambulatory Visit: Payer: Self-pay

## 2018-11-14 MED ORDER — VENLAFAXINE HCL ER 75 MG PO CP24: 75.0000 mg | ORAL_CAPSULE | Freq: Every day | ORAL | 0 refills | Status: DC

## 2018-11-14 NOTE — Telephone Encounter (Signed)
Sent to the pharmacy by e-scribe for 90 days.  Pt has upcoming cpx on 12/27/2018

## 2018-11-15 ENCOUNTER — Other Ambulatory Visit: Payer: Self-pay

## 2018-11-15 ENCOUNTER — Encounter: Payer: Self-pay | Admitting: Internal Medicine

## 2018-11-15 ENCOUNTER — Ambulatory Visit (INDEPENDENT_AMBULATORY_CARE_PROVIDER_SITE_OTHER): Payer: Medicare HMO | Admitting: Internal Medicine

## 2018-11-15 VITALS — BP 120/70 | HR 95 | Ht 64.5 in | Wt 174.0 lb

## 2018-11-15 DIAGNOSIS — E7800 Pure hypercholesterolemia, unspecified: Secondary | ICD-10-CM

## 2018-11-15 DIAGNOSIS — Z794 Long term (current) use of insulin: Secondary | ICD-10-CM

## 2018-11-15 DIAGNOSIS — E663 Overweight: Secondary | ICD-10-CM

## 2018-11-15 DIAGNOSIS — E1121 Type 2 diabetes mellitus with diabetic nephropathy: Secondary | ICD-10-CM | POA: Diagnosis not present

## 2018-11-15 LAB — HEMOGLOBIN A1C: Hemoglobin A1C: 7.2

## 2018-11-15 MED ORDER — GLIMEPIRIDE 4 MG PO TABS: 4.0000 mg | ORAL_TABLET | Freq: Two times a day (BID) | ORAL | 3 refills | Status: DC

## 2018-11-15 NOTE — Progress Notes (Signed)
Patient ID: [name] [name], female   DOB: [birth year], 85 y.o.   MRN: [id number]  HPI: [name] [name] is a 85 y.o.-year-old female, returning for f/u for DM2, dx 2005, insulin-dependent, uncontrolled, with complications (nephropathy). Last visit 4 months ago. She has [employer].  Last hemoglobin A1c was: Lab Results  Component Value Date   HGBA1C 6.6 (A) 07/16/2018   HGBA1C 6.6 (A) 03/15/2018   HGBA1C 7.2 (A) 12/12/2017   HGBA1C 8.4% 10/05/2017   HGBA1C 8.3 07/06/2017   HGBA1C 8.3 02/24/2017   HGBA1C 8.4 11/15/2016   HGBA1C 8.7 08/18/2016   HGBA1C 9.4 03/29/2016   HGBA1C 7.8 12/28/2015   Pt is on a regimen of:  - Amaryl 4 mg 2x a day, before meals. - Trulicity 1.5  mg weekly -this is expensive, daughter helps her with the expense - Basaglar 32 units at bedtime - NovoLog   20-25 units before b'fast or lunch  26-28 units before dinner (however, she mostly takes this after dinner!) We had to stop Metformin 2/2 increased creatinine. We stopped Amaryl at last visit.  Pt checks her sugars twice a day: - am: 81, 95, 113-162, 170 >> 96-139 >> 99-159 - 2h after b'fast: 98-185 >> n/c >> 166 >> n/c >> 171, 172 - lunch:150-180 >> 126 >> 196, 265 >> n/c >> 161, 203 - 2h after lunch: 161, 181 >> 120-150 >> n/c  - before dinner: 180, 192 >> n/c >> 217 >> n/c >> 136 - later: 166-265 >> 190-300 (snacks at night) >> 171-290 Lowest sugar was 95 >> 96 >> 91. She has hypoglycemia awareness  in the 70s. Highest 291 >> 300 (forgot medicines) >> 290  + CKD stage III; last BUN/Cr: Lab Results  Component Value Date   BUN 26 (H) 06/01/2017   CREATININE 1.50 (H) 06/01/2017   + HL; last set of lipids: Lab Results  Component Value Date   CHOL 203 (H) 06/01/2017   HDL 48.40 06/01/2017   LDLDIRECT 118.0 06/01/2017   TRIG 336.0 (H) 06/01/2017   CHOLHDL 4 06/01/2017  On Zocor 20. Pt's last eye exam was in 2019: No DR.-  Dr. Susa Loffler at [facility].   No numbness and tingling in her feet  ROS: Constitutional: no weight gain/no weight loss, no fatigue, no subjective hyperthermia, no subjective hypothermia Eyes: no blurry vision, no xerophthalmia ENT: no sore throat, no nodules palpated in neck, no dysphagia, no odynophagia, no hoarseness Cardiovascular: no CP/no SOB/no palpitations/no leg swelling Respiratory: no cough/no SOB/no wheezing Gastrointestinal: no N/no V/no D/no C/no acid reflux Musculoskeletal: no muscle aches/no joint aches Skin: no rashes, no hair loss Neurological: no tremors/no numbness/no tingling/no dizziness  I reviewed pt's medications, allergies, PMH, social hx, family hx, and changes were documented in the history of present illness. Otherwise, unchanged from my initial visit note.  Past Medical History:  Diagnosis Date  . Asthma   . DEGENERATIVE JOINT DISEASE, GENERALIZED 02/01/2007   Qualifier: Diagnosis of  By: Burnice Logan  MD, Doretha Sou   . DIARRHEA, CHRONIC 08/20/2009   Qualifier: Diagnosis of  By: Burnice Logan  MD, Doretha Sou   . DM 02/01/2007   Qualifier: Diagnosis of  By: Floyde Parkins    . FATIGUE 10/20/2008   Qualifier: Diagnosis of  By: Burnice Logan  MD, Doretha Sou   . HYPERCHOLESTEROLEMIA 02/01/2007   Qualifier: Diagnosis of  By: Floyde Parkins    . HYPERLIPIDEMIA 12/20/2007   Qualifier: Diagnosis of  By: Burnice Logan  MD, Doretha Sou   .  HYPERTENSION 02/01/2007   Qualifier: Diagnosis of  By: Floyde Parkins    . LOW BACK PAIN 01/13/2010   Qualifier: Diagnosis of  By: Burnice Logan  MD, Doretha Sou MUSCLE CRAMPS 04/06/2010   Qualifier: Diagnosis of  By: Burnice Logan  MD, Doretha Sou   . MYALGIA 01/20/2010   Qualifier: Diagnosis of  By: Elease Hashimoto MD, [place]    . OSTEOARTHRITIS 01/13/2010   Qualifier: Diagnosis of  By: Burnice Logan  MD, Doretha Sou   . OVERACTIVE BLADDER 08/20/2009   Qualifier: Diagnosis of  By: Burnice Logan  MD, Doretha Sou   . PLEURISY 06/18/2008   Qualifier: Diagnosis of  By: Niel Hummer MD, [place] PNEUMONIA  06/18/2008   Qualifier: Diagnosis of  By: Niel Hummer MD, [place] Rectal prolapse 09/20/2007   Qualifier: Diagnosis of  By: Burnice Logan  MD, Doretha Sou   . VITAMIN B12 DEFICIENCY 07/20/2010   Qualifier: Diagnosis of  By: Carlean Purl MD, Dimas Millin    Past Surgical History:  Procedure Laterality Date  . ABDOMINAL HYSTERECTOMY     partial- states she has her ovaries  . APPENDECTOMY    . BLADDER SURGERY    . CATARACT EXTRACTION Bilateral 2012  . CHOLECYSTECTOMY     Social History   Socioeconomic History  . Marital status: Single    Spouse name: Not on file  . Number of children: Not on file  . Years of education: Not on file  . Highest education level: Not on file  Occupational History  . Not on file  Social Needs  . Financial resource strain: Not on file  . Food insecurity:    Worry: Not on file    Inability: Not on file  . Transportation needs:    Medical: Not on file    Non-medical: Not on file  Tobacco Use  . Smoking status: Former Smoker    Packs/day: 10.00  . Smokeless tobacco: Never Used  . Tobacco comment: quit > 59 yo   Substance and Sexual Activity  . Alcohol use: No  . Drug use: No  . Sexual activity: Not on file  Lifestyle  . Physical activity:    Days per week: Not on file    Minutes per session: Not on file  . Stress: Not on file  Relationships  . Social connections:    Talks on phone: Not on file    Gets together: Not on file    Attends religious service: Not on file    Active member of club or organization: Not on file    Attends meetings of clubs or organizations: Not on file    Relationship status: Not on file  . Intimate partner violence:    Fear of current or ex partner: Not on file    Emotionally abused: Not on file    Physically abused: Not on file    Forced sexual activity: Not on file  Other Topics Concern  . 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 Acetaminophen 650 MG TABS Take 1 tablet (650 mg total) by mouth 3 (three) times daily as needed. 30 tablet 0  . aspirin 81 MG tablet Take 81 mg by mouth daily.      . calcium-vitamin D (OSCAL WITH D 500-200) 500-200  MG-UNIT per tablet Take 1 tablet by mouth daily.      . cyanocobalamin 1000 MCG tablet Take 100 mcg by mouth daily.      . diphenoxylate-atropine (LOMOTIL) 2.5-0.025 MG tablet Take 1 tablet by mouth 3 (three) times daily as needed for diarrhea or loose stools. 90 tablet 0  . Dulaglutide (TRULICITY) 1.5 DJ/4.9FW SOPN Inject 1.5 mg into the skin once a week. 4 pen 11  . fluticasone (FLONASE) 50 MCG/ACT nasal spray Place 1 spray into the nose daily. 16 g 0  . glimepiride (AMARYL) 4 MG tablet Take 1 tablet by mouth twice daily 180 tablet 0  . glucose blood (ONE TOUCH ULTRA TEST) test strip Use as instructed to test 3 times daily DX: E11.21 300 each 5  . hydrochlorothiazide (HYDRODIURIL) 25 MG tablet Take 1 tablet by mouth once daily 90 tablet 0  . HYDROcodone-homatropine (HYCODAN) 5-1.5 MG/5ML syrup Take 2.5 mLs by mouth every 6 (six) hours as needed for cough. 180 mL 0  . ibuprofen (ADVIL,MOTRIN) 600 MG tablet TAKE 1 TABLET BY MOUTH EVERY 6 HOURS AS NEEDED FOR PAIN 60 tablet 1  . insulin aspart (NOVOLOG FLEXPEN) 100 UNIT/ML FlexPen Inject 20-30 Units into the skin 3 (three) times daily with meals. 45 mL 3  . insulin glargine (LANTUS) 100 unit/mL SOPN Inject 0.32 mLs (32 Units total) into the skin at bedtime. 15 mL 11  . Insulin Pen Needle (BD PEN NEEDLE NANO U/F) 32G X 4 MM MISC USE TO INJECT INSULIN 4 TIMES DAILY AS DIRECTED. 130 each 11  . Lancets (ONETOUCH ULTRASOFT) lancets Use as instructed to test 3 times daily 100 each 12  . Multiple Vitamin (MULTIVITAMIN) tablet Take 1 tablet by mouth daily.      . simvastatin (ZOCOR) 40 MG tablet TAKE 1/2 (ONE-HALF) TABLET BY MOUTH AT BEDTIME 45 tablet 1  . traZODone (DESYREL)  150 MG tablet TAKE 1 TABLET BY MOUTH AT BEDTIME AS NEEDED 90 tablet 3  . venlafaxine XR (EFFEXOR-XR) 75 MG 24 hr capsule Take 1 capsule (75 mg total) by mouth daily. 90 capsule 0   No current facility-administered medications on file prior to visit.    Allergies  Allergen Reactions  . Metformin     REACTION: diarrhea  . Sitagliptin Phosphate     REACTION: abdominal pain   Family History  Problem Relation Age of Onset  . Deep vein thrombosis Mother   . COPD Father   . Asthma Father   . Diabetes Neg Hx        family hx  . Cancer Neg Hx        breast ca - sister(S)?  Marland Kitchen Heart disease Neg Hx        family hx   PE: BP 120/70   Pulse 95   Ht 5' 4.5" (1.638 m)   Wt 174 lb (78.9 kg)   SpO2 95%   BMI 29.41 kg/m  Body mass index is 29.41 kg/m. Wt Readings from Last 3 Encounters:  11/15/18 174 lb (78.9 kg)  07/16/18 172 lb (78 kg)  05/08/18 171 lb (77.6 kg)   Constitutional: overweight, in NAD Eyes: PERRLA, EOMI, no exophthalmos ENT: moist mucous membranes, no thyromegaly, no cervical lymphadenopathy Cardiovascular: tachycardia, RR, No RG, + 2/6 SEM Respiratory: CTA B Gastrointestinal: abdomen soft, NT, ND, BS+ Musculoskeletal: no deformities, strength intact in all 4 Skin: moist, warm, no rashes Neurological: no tremor with outstretched hands, DTR normal in all 4  ASSESSMENT: 1. DM2, insulin-dependent, uncontrolled,  without complications - + nephropathy  We have tried to stop Amaryl, but sugars were higher >> we restarted it.  2. HL  3.  Overweight  PLAN:  1. Patient with longstanding, uncontrolled, type 2 diabetes, on a complex diabetes regimen containing basal-bolus insulin regimen, sulfonylurea, and weekly GLP-1 receptor agonist.  At last visit, sugars were at goal earlier in the day but high after dinner as she was usually eating out dinner with her son and snacking afterwards.  She was not using the NovoLog doses recommended so I advised her to increase the  insulin with dinner by several units.  In the meantime, we reduced the dose of Basaglar since she had abrupt CBG drops from bedtime to a.m.  We continue to Trulicity and Amaryl.  We discussed about the importance of not eating out too frequently. -At this visit, sugars are still at or close to goal in the morning but they are quite high at night, mostly in the 200s.  Upon questioning, she is taking NovoLog after, rather than before dinner, as she forgets.  We discussed about the importance of taking the medication before the meal to avoid high blood sugars later at night.  We discussed about setting alarms on her phone to do so.  Also, by taking the NovoLog before dinner, she may prevent the abrupt drop of sugars overnight.  No changes in her medication doses are needed for now. -Given a Trulicity sample that she could not afford a refill for now - I advised her to:   Patient Instructions  Please continue:  - Amaryl 4 mg 2x a day, before meals. - Trulicity 1.5  mg weekly - Basaglar 32 units at bedtime - NovoLog   20-25 units before b'fast or lunch  26-28 units before dinner  Please set an alarm on your phone to take Novolog before meals.  Please return in 4 months with your sugar log  - today, HbA1c is 7.2% (higher) - continue checking sugars at different times of the day - check 3x a day, rotating checks - advised for yearly eye exams >> she is UTD - She will have an appointment with PCP at the end of this month for an AP - Return to clinic in 4 mo with sugar log       2. HL - Reviewed latest lipid panel from 05/2017: LDL above goal, triglycerides high Lab Results  Component Value Date   CHOL 203 (H) 06/01/2017   HDL 48.40 06/01/2017   LDLDIRECT 118.0 06/01/2017   TRIG 336.0 (H) 06/01/2017   CHOLHDL 4 06/01/2017  - Continues Zocor  without side effects.  3.  Overweight -Continue Trulicity which should also help with weight loss -Gained 2 pounds since last visit  Philemon Kingdom, MD PhD [facility] Endocrinology

## 2018-11-15 NOTE — Patient Instructions (Addendum)
Please continue:  - Amaryl 4 mg 2x a day, before meals. - Trulicity 1.5  mg weekly - Basaglar 32 units at bedtime - NovoLog   20-25 units before b'fast or lunch  26-28 units before dinner  Please set an alarm on your phone to take Novolog before meals.  Please return in 4 months with your sugar log

## 2018-11-22 ENCOUNTER — Other Ambulatory Visit: Payer: Self-pay

## 2018-11-22 MED ORDER — BASAGLAR KWIKPEN 100 UNIT/ML ~~LOC~~ SOPN: PEN_INJECTOR | SUBCUTANEOUS | 4 refills | Status: DC

## 2018-11-22 NOTE — Telephone Encounter (Signed)
Lantus is not covered, pharmacy recommends Basaglar and per Dr. Cruzita Lederer ok to change.

## 2018-12-27 ENCOUNTER — Other Ambulatory Visit: Payer: Self-pay

## 2018-12-27 ENCOUNTER — Ambulatory Visit (INDEPENDENT_AMBULATORY_CARE_PROVIDER_SITE_OTHER): Payer: Medicare HMO | Admitting: Adult Health

## 2018-12-27 ENCOUNTER — Encounter: Payer: Self-pay | Admitting: Adult Health

## 2018-12-27 VITALS — BP 118/84 | Temp 98.7°F | Ht 64.0 in | Wt 173.0 lb

## 2018-12-27 DIAGNOSIS — E1121 Type 2 diabetes mellitus with diabetic nephropathy: Secondary | ICD-10-CM

## 2018-12-27 DIAGNOSIS — E7800 Pure hypercholesterolemia, unspecified: Secondary | ICD-10-CM

## 2018-12-27 DIAGNOSIS — Z0000 Encounter for general adult medical examination without abnormal findings: Secondary | ICD-10-CM | POA: Diagnosis not present

## 2018-12-27 DIAGNOSIS — F419 Anxiety disorder, unspecified: Secondary | ICD-10-CM

## 2018-12-27 DIAGNOSIS — I10 Essential (primary) hypertension: Secondary | ICD-10-CM | POA: Diagnosis not present

## 2018-12-27 DIAGNOSIS — G4700 Insomnia, unspecified: Secondary | ICD-10-CM | POA: Diagnosis not present

## 2018-12-27 DIAGNOSIS — Z794 Long term (current) use of insulin: Secondary | ICD-10-CM

## 2018-12-27 DIAGNOSIS — R69 Illness, unspecified: Secondary | ICD-10-CM | POA: Diagnosis not present

## 2018-12-27 LAB — COMPREHENSIVE METABOLIC PANEL
ALT: 25 U/L (ref 0–35)
AST: 37 U/L (ref 0–37)
Albumin: 4 g/dL (ref 3.5–5.2)
Alkaline Phosphatase: 40 U/L (ref 39–117)
BUN: 25 mg/dL — ABNORMAL HIGH (ref 6–23)
CO2: 30 mEq/L (ref 19–32)
Calcium: 9.9 mg/dL (ref 8.4–10.5)
Chloride: 98 mEq/L (ref 96–112)
Creatinine, Ser: 1.46 mg/dL — ABNORMAL HIGH (ref 0.40–1.20)
GFR: 33.85 mL/min — ABNORMAL LOW (ref >60.00)
Glucose, Bld: 132 mg/dL — ABNORMAL HIGH (ref 70–99)
Potassium: 4.7 mEq/L (ref 3.5–5.1)
Sodium: 139 mEq/L (ref 135–145)
Total Bilirubin: 0.4 mg/dL (ref 0.2–1.2)
Total Protein: 7.4 g/dL (ref 6.0–8.3)

## 2018-12-27 LAB — LDL CHOLESTEROL, DIRECT: Direct LDL: 112 mg/dL

## 2018-12-27 LAB — LIPID PANEL
Cholesterol: 207 mg/dL — ABNORMAL HIGH (ref 0–200)
HDL: 53.8 mg/dL (ref >39.00)
Total CHOL/HDL Ratio: 4
Triglycerides: 404 mg/dL — ABNORMAL HIGH (ref 0.0–149.0)

## 2018-12-27 LAB — CBC WITH DIFFERENTIAL/PLATELET
Basophils Absolute: 0.1 10*3/uL (ref 0.0–0.1)
Basophils Relative: 1.3 % (ref 0.0–3.0)
Eosinophils Absolute: 0.5 10*3/uL (ref 0.0–0.7)
Eosinophils Relative: 5.6 % — ABNORMAL HIGH (ref 0.0–5.0)
HCT: 37.3 % (ref 36.0–46.0)
Hemoglobin: 12.7 g/dL (ref 12.0–15.0)
Lymphocytes Relative: 37.5 % (ref 12.0–46.0)
Lymphs Abs: 3 10*3/uL (ref 0.7–4.0)
MCHC: 34 g/dL (ref 30.0–36.0)
MCV: 95.6 fl (ref 78.0–100.0)
Monocytes Absolute: 0.6 10*3/uL (ref 0.1–1.0)
Monocytes Relative: 7.7 % (ref 3.0–12.0)
Neutro Abs: 3.9 10*3/uL (ref 1.4–7.7)
Neutrophils Relative %: 47.9 % (ref 43.0–77.0)
Platelets: 236 10*3/uL (ref 150.0–400.0)
RBC: 3.9 Mil/uL (ref 3.87–5.11)
RDW: 13.2 % (ref 11.5–15.5)
WBC: 8.1 10*3/uL (ref 4.0–10.5)

## 2018-12-27 LAB — TSH: TSH: 1.7 u[IU]/mL (ref 0.35–4.50)

## 2018-12-27 MED ORDER — TRAZODONE HCL 150 MG PO TABS: 150.0000 mg | ORAL_TABLET | Freq: Every evening | ORAL | 1 refills | Status: DC | PRN

## 2018-12-27 MED ORDER — HYDROCHLOROTHIAZIDE 25 MG PO TABS: 25.0000 mg | ORAL_TABLET | Freq: Every day | ORAL | 3 refills | Status: DC

## 2018-12-27 MED ORDER — SIMVASTATIN 40 MG PO TABS: ORAL_TABLET | ORAL | 3 refills | Status: DC

## 2018-12-27 NOTE — Progress Notes (Signed)
Subjective:    Patient ID: [name] [name], female    DOB: 1929/03/02, 85 y.o.   MRN: [id number]  HPI  Patient presents for yearly preventative medicine examination. She is a pleasant 85 year old female who  has a past medical history of Asthma, DEGENERATIVE JOINT DISEASE, GENERALIZED (02/01/2007), DIARRHEA, CHRONIC (08/20/2009), DM (02/01/2007), FATIGUE (10/20/2008), HYPERCHOLESTEROLEMIA (02/01/2007), HYPERLIPIDEMIA (12/20/2007), HYPERTENSION (02/01/2007), LOW BACK PAIN (01/13/2010), MUSCLE CRAMPS (04/06/2010), MYALGIA (01/20/2010), OSTEOARTHRITIS (01/13/2010), OVERACTIVE BLADDER (08/20/2009), PLEURISY (06/18/2008), PNEUMONIA (06/18/2008), Rectal prolapse (09/20/2007), and VITAMIN B12 DEFICIENCY (07/20/2010).   DM II-is managed by endocrinology.  She is currently prescribed Trulicity 1.5 mg, Amaryl 4 mg twice daily, Basaglar 32 units at bedtime, and sliding scale NovoLog 3 times daily with meals.  She was last seen on 11/15/2018 at which time her A1c was 7.2  Hyperlipidemia -takes simvastatin 20  mg at bedtime and daily 81 mg ASA. Denies Myalgias Lab Results  Component Value Date   CHOL 203 (H) 06/01/2017   HDL 48.40 06/01/2017   LDLDIRECT 118.0 06/01/2017   TRIG 336.0 (H) 06/01/2017   CHOLHDL 4 06/01/2017   Anxiety -takes Effexor 75 mg daily and trazodone 150 mg at bedtime to help her sleep.  Does report issues with trouble sleeping.  But feels as though her anxiety is well controlled  Essential Hypertension - Controlled with HCTZ 25 mg.  She denies dizziness, lightheadedness, blurred vision, headaches, or syncope BP Readings from Last 3 Encounters:  12/27/18 118/84  11/15/18 120/70  07/16/18 128/80     All immunizations and health maintenance protocols were reviewed with the patient and needed orders were placed. uTD  Appropriate screening laboratory values were ordered for the patient including screening of hyperlipidemia, renal function and hepatic function.  Medication reconciliation,   past medical history, social history, problem list and allergies were reviewed in detail with the patient  Goals were established with regard to weight loss, exercise, and  diet in compliance with medications.  She has not been exercising much due to COVID restrictions.  Her diet has also suffered since she has been in the house more.  End of life planning was discussed.  Review of Systems  Constitutional: Negative.   HENT: Negative.   Eyes: Negative.   Respiratory: Negative.   Cardiovascular: Negative.   Gastrointestinal: Negative.   Endocrine: Negative.   Genitourinary: Negative.   Musculoskeletal: Positive for arthralgias.  Allergic/Immunologic: Negative.   Neurological: Negative.   Hematological: Negative.   Psychiatric/Behavioral: Negative.   All other systems reviewed and are negative.    Past Medical History:  Diagnosis Date  . Asthma   . DEGENERATIVE JOINT DISEASE, GENERALIZED 02/01/2007   Qualifier: Diagnosis of  By: Burnice Logan  MD, Doretha Sou   . DIARRHEA, CHRONIC 08/20/2009   Qualifier: Diagnosis of  By: Burnice Logan  MD, Doretha Sou   . DM 02/01/2007   Qualifier: Diagnosis of  By: Floyde Parkins    . FATIGUE 10/20/2008   Qualifier: Diagnosis of  By: Burnice Logan  MD, Doretha Sou   . HYPERCHOLESTEROLEMIA 02/01/2007   Qualifier: Diagnosis of  By: Floyde Parkins    . HYPERLIPIDEMIA 12/20/2007   Qualifier: Diagnosis of  By: Burnice Logan  MD, Doretha Sou   . HYPERTENSION 02/01/2007   Qualifier: Diagnosis of  By: Floyde Parkins    . LOW BACK PAIN 01/13/2010   Qualifier: Diagnosis of  By: Burnice Logan  MD, Doretha Sou MUSCLE CRAMPS 04/06/2010   Qualifier: Diagnosis of  By: Burnice Logan  MD, Doretha Sou   .  MYALGIA 01/20/2010   Qualifier: Diagnosis of  By: Elease Hashimoto MD, [place]    . OSTEOARTHRITIS 01/13/2010   Qualifier: Diagnosis of  By: Burnice Logan  MD, Doretha Sou   . OVERACTIVE BLADDER 08/20/2009   Qualifier: Diagnosis of  By: Burnice Logan  MD, Doretha Sou   . PLEURISY 06/18/2008   Qualifier: Diagnosis of  By:  Niel Hummer MD, [place] PNEUMONIA 06/18/2008   Qualifier: Diagnosis of  By: Niel Hummer MD, [place] Rectal prolapse 09/20/2007   Qualifier: Diagnosis of  By: Burnice Logan  MD, Doretha Sou   . VITAMIN B12 DEFICIENCY 07/20/2010   Qualifier: Diagnosis of  By: Carlean Purl MD, Dimas Millin     Social History   Socioeconomic History  . Marital status: Single    Spouse name: Not on file  . Number of children: Not on file  . Years of education: Not on file  . Highest education level: Not on file  Occupational History  . Not on file  Social Needs  . Financial resource strain: Not on file  . Food insecurity    Worry: Not on file    Inability: Not on file  . Transportation needs    Medical: Not on file    Non-medical: Not on file  Tobacco Use  . Smoking status: Former Smoker    Packs/day: 10.00  . Smokeless tobacco: Never Used  . Tobacco comment: quit > 59 yo   Substance and Sexual Activity  . Alcohol use: No  . Drug use: No  . Sexual activity: Not on file  Lifestyle  . Physical activity    Days per week: Not on file    Minutes per session: Not on file  . Stress: Not on file  Relationships  . Social Herbalist on phone: Not on file    Gets together: Not on file    Attends religious service: Not on file    Active member of club or organization: Not on file    Attends meetings of clubs or organizations: Not on file    Relationship status: Not on file  . Intimate partner violence    Fear of current or ex partner: Not on file    Emotionally abused: Not on file    Physically abused: Not on file    Forced sexual activity: Not on file  Other Topics Concern  . Not on file  Social History Narrative   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    Married for 9 years, widowed for 22 years     Past Surgical History:  Procedure Laterality Date  . ABDOMINAL HYSTERECTOMY     partial- states she has her ovaries  . APPENDECTOMY    . BLADDER SURGERY    . CATARACT EXTRACTION Bilateral 2012  .  CHOLECYSTECTOMY      Family History  Problem Relation Age of Onset  . Deep vein thrombosis Mother   . COPD Father   . Asthma Father   . Diabetes Neg Hx        family hx  . Cancer Neg Hx        breast ca - sister(S)?  Marland Kitchen Heart disease Neg Hx        family hx    Allergies  Allergen Reactions  . Metformin     REACTION: diarrhea  . Sitagliptin Phosphate     REACTION: abdominal pain    Current Outpatient Medications on File Prior to Visit  Medication  Sig Dispense Refill  . Acetaminophen 650 MG TABS Take 1 tablet (650 mg total) by mouth 3 (three) times daily as needed. 30 tablet 0  . aspirin 81 MG tablet Take 81 mg by mouth daily.      . calcium-vitamin D (OSCAL WITH D 500-200) 500-200 MG-UNIT per tablet Take 1 tablet by mouth daily.      . cyanocobalamin 1000 MCG tablet Take 100 mcg by mouth daily.      . diphenoxylate-atropine (LOMOTIL) 2.5-0.025 MG tablet Take 1 tablet by mouth 3 (three) times daily as needed for diarrhea or loose stools. 90 tablet 0  . Dulaglutide (TRULICITY) 1.5 IW/5.8KD SOPN Inject 1.5 mg into the skin once a week. 4 pen 11  . fluticasone (FLONASE) 50 MCG/ACT nasal spray Place 1 spray into the nose daily. 16 g 0  . glimepiride (AMARYL) 4 MG tablet Take 1 tablet (4 mg total) by mouth 2 (two) times daily. 180 tablet 3  . glucose blood (ONE TOUCH ULTRA TEST) test strip Use as instructed to test 3 times daily DX: E11.21 300 each 5  . hydrochlorothiazide (HYDRODIURIL) 25 MG tablet Take 1 tablet by mouth once daily 90 tablet 0  . ibuprofen (ADVIL,MOTRIN) 600 MG tablet TAKE 1 TABLET BY MOUTH EVERY 6 HOURS AS NEEDED FOR PAIN 60 tablet 1  . insulin aspart (NOVOLOG FLEXPEN) 100 UNIT/ML FlexPen Inject 20-30 Units into the skin 3 (three) times daily with meals. 45 mL 3  . Insulin Glargine (BASAGLAR KWIKPEN) 100 UNIT/ML SOPN Use to inject 32 units at bedtime 15 mL 4  . Insulin Pen Needle (BD PEN NEEDLE NANO U/F) 32G X 4 MM MISC USE TO INJECT INSULIN 4 TIMES DAILY AS  DIRECTED. 130 each 11  . Lancets (ONETOUCH ULTRASOFT) lancets Use as instructed to test 3 times daily 100 each 12  . Multiple Vitamin (MULTIVITAMIN) tablet Take 1 tablet by mouth daily.      . simvastatin (ZOCOR) 40 MG tablet TAKE 1/2 (ONE-HALF) TABLET BY MOUTH AT BEDTIME 45 tablet 1  . traZODone (DESYREL) 150 MG tablet TAKE 1 TABLET BY MOUTH AT BEDTIME AS NEEDED 90 tablet 3  . venlafaxine XR (EFFEXOR-XR) 75 MG 24 hr capsule Take 1 capsule (75 mg total) by mouth daily. 90 capsule 0   No current facility-administered medications on file prior to visit.     BP 118/84   Temp 98.7 F (37.1 C)   Ht 5\' 4"  (1.626 m)   Wt 173 lb (78.5 kg)   BMI 29.70 kg/m       Objective:   Physical Exam Vitals signs and nursing note reviewed.  Constitutional:      Appearance: She is obese.  HENT:     Right Ear: Tympanic membrane, ear canal and external ear normal. There is no impacted cerumen.     Left Ear: Tympanic membrane, ear canal and external ear normal.     Nose: Nose normal.     Mouth/Throat:     Mouth: Mucous membranes are moist.     Pharynx: Oropharynx is clear. No oropharyngeal exudate or posterior oropharyngeal erythema.  Eyes:     Extraocular Movements: Extraocular movements intact.     Pupils: Pupils are equal, round, and reactive to light.  Neck:     Musculoskeletal: Normal range of motion and neck supple.     Vascular: No carotid bruit.  Cardiovascular:     Rate and Rhythm: Normal rate and regular rhythm.     Pulses: Normal pulses.  Heart sounds: Normal heart sounds.  Pulmonary:     Effort: Pulmonary effort is normal. No respiratory distress.     Breath sounds: Normal breath sounds. No stridor. No wheezing, rhonchi or rales.  Chest:     Chest wall: No tenderness.  Abdominal:     General: Abdomen is flat. Bowel sounds are normal. There is no distension.     Palpations: Abdomen is soft. There is no mass.     Tenderness: There is no abdominal tenderness. There is no right  CVA tenderness, left CVA tenderness, guarding or rebound.     Hernia: No hernia is present.  Musculoskeletal: Normal range of motion.        General: No swelling, tenderness, deformity or signs of injury.     Left lower leg: No edema.  Skin:    General: Skin is warm and dry.     Capillary Refill: Capillary refill takes less than 2 seconds.     Coloration: Skin is not jaundiced or pale.     Findings: No bruising, erythema, lesion or rash.  Neurological:     General: No focal deficit present.     Mental Status: She is alert and oriented to person, place, and time.  Psychiatric:        Mood and Affect: Mood normal.        Behavior: Behavior normal.        Thought Content: Thought content normal.        Judgment: Judgment normal.       Assessment & Plan:  1. Routine general medical examination at a health care facility  - CBC with Differential/Platelet - Comprehensive metabolic panel - Lipid panel - TSH  2. Essential hypertension - No change in mediation  - hydrochlorothiazide (HYDRODIURIL) 25 MG tablet; Take 1 tablet (25 mg total) by mouth daily.  Dispense: 90 tablet; Refill: 3  3. Type 2 diabetes mellitus with diabetic nephropathy, with long-term current use of insulin ([place]) - Follow up with Endocrinology as directed - Work on lifestyle modifications   4. HYPERCHOLESTEROLEMIA  - simvastatin (ZOCOR) 40 MG tablet; TAKE 1/2 (ONE-HALF) TABLET BY MOUTH AT BEDTIME  Dispense: 45 tablet; Refill: 3  5. Anxiety  - traZODone (DESYREL) 150 MG tablet; Take 1 tablet (150 mg total) by mouth at bedtime as needed.  Dispense: 90 tablet; Refill: 1  6. Insomnia, unspecified type - Can try adding low dose Melatonin QHS - traZODone (DESYREL) 150 MG tablet; Take 1 tablet (150 mg total) by mouth at bedtime as needed.  Dispense: 90 tablet; Refill: 1   Dorothyann Peng, NP

## 2018-12-27 NOTE — Patient Instructions (Signed)
It was great seeing you today   We will follow up on your labs   Please try adding Melatonin 3 mg for sleep

## 2019-01-02 ENCOUNTER — Other Ambulatory Visit: Payer: Self-pay | Admitting: Family Medicine

## 2019-01-02 DIAGNOSIS — E7800 Pure hypercholesterolemia, unspecified: Secondary | ICD-10-CM

## 2019-01-02 MED ORDER — SIMVASTATIN 40 MG PO TABS: 40.0000 mg | ORAL_TABLET | Freq: Every day | ORAL | 3 refills | Status: DC

## 2019-01-03 ENCOUNTER — Telehealth: Payer: Self-pay | Admitting: Adult Health

## 2019-01-03 DIAGNOSIS — E7800 Pure hypercholesterolemia, unspecified: Secondary | ICD-10-CM

## 2019-01-03 MED ORDER — SIMVASTATIN 40 MG PO TABS: 40.0000 mg | ORAL_TABLET | Freq: Every day | ORAL | 3 refills | Status: DC

## 2019-01-03 NOTE — Telephone Encounter (Signed)
CORRECT RX SENT TO THE PHARMACY BY E-SCRIBE.

## 2019-01-03 NOTE — Telephone Encounter (Signed)
[name] [name] from [facility] on [address] had questions about prescription medication  (Simvastatin). Pharmacist states the way medication is written her insurance does not cover.  Patient also states she was suppose to take two pills a day. Pharmacy call back   [phone]

## 2019-01-11 ENCOUNTER — Telehealth: Payer: Self-pay

## 2019-01-11 MED ORDER — INSULIN LISPRO (1 UNIT DIAL) 100 UNIT/ML (KWIKPEN): PEN_INJECTOR | SUBCUTANEOUS | 11 refills | Status: DC

## 2019-01-11 NOTE — Telephone Encounter (Signed)
Yes, ok to change

## 2019-01-11 NOTE — Telephone Encounter (Signed)
Ok to switch from [employer] to Humalog?

## 2019-01-11 NOTE — Telephone Encounter (Signed)
RX sent

## 2019-02-11 ENCOUNTER — Other Ambulatory Visit: Payer: Self-pay | Admitting: Adult Health

## 2019-02-13 NOTE — Telephone Encounter (Signed)
Sent to the pharmacy by e-scribe. 

## 2019-04-02 DIAGNOSIS — R69 Illness, unspecified: Secondary | ICD-10-CM | POA: Diagnosis not present

## 2019-04-09 DIAGNOSIS — H3500 Unspecified background retinopathy: Secondary | ICD-10-CM | POA: Diagnosis not present

## 2019-04-09 DIAGNOSIS — H52203 Unspecified astigmatism, bilateral: Secondary | ICD-10-CM | POA: Diagnosis not present

## 2019-04-09 DIAGNOSIS — E119 Type 2 diabetes mellitus without complications: Secondary | ICD-10-CM | POA: Diagnosis not present

## 2019-04-09 DIAGNOSIS — Z961 Presence of intraocular lens: Secondary | ICD-10-CM | POA: Diagnosis not present

## 2019-04-09 LAB — HM DIABETES EYE EXAM

## 2019-04-11 ENCOUNTER — Other Ambulatory Visit: Payer: Self-pay

## 2019-04-15 ENCOUNTER — Ambulatory Visit (INDEPENDENT_AMBULATORY_CARE_PROVIDER_SITE_OTHER): Payer: Medicare HMO | Admitting: Internal Medicine

## 2019-04-15 ENCOUNTER — Encounter: Payer: Self-pay | Admitting: Internal Medicine

## 2019-04-15 ENCOUNTER — Other Ambulatory Visit: Payer: Self-pay

## 2019-04-15 VITALS — BP 136/80 | HR 88 | Ht 64.5 in | Wt 174.0 lb

## 2019-04-15 DIAGNOSIS — E7800 Pure hypercholesterolemia, unspecified: Secondary | ICD-10-CM

## 2019-04-15 DIAGNOSIS — Z794 Long term (current) use of insulin: Secondary | ICD-10-CM | POA: Diagnosis not present

## 2019-04-15 DIAGNOSIS — E1121 Type 2 diabetes mellitus with diabetic nephropathy: Secondary | ICD-10-CM

## 2019-04-15 DIAGNOSIS — E663 Overweight: Secondary | ICD-10-CM

## 2019-04-15 LAB — POCT GLYCOSYLATED HEMOGLOBIN (HGB A1C): Hemoglobin A1C: 7.3 % — AB (ref 4.0–5.6)

## 2019-04-15 NOTE — Progress Notes (Signed)
Patient ID: [name] [name], female   DOB: [birth year], 85 y.o.   MRN: [id number]  HPI: [name] [name] is a 85 y.o.-year-old female, returning for f/u for DM2, dx 2005, insulin-dependent, uncontrolled, with complications (nephropathy). Last visit 5 months ago. She has [employer].  Last hemoglobin A1c was: Lab Results  Component Value Date   HGBA1C 7.2 11/15/2018   HGBA1C 6.6 (A) 07/16/2018   HGBA1C 6.6 (A) 03/15/2018   HGBA1C 7.2 (A) 12/12/2017   HGBA1C 8.4% 10/05/2017   HGBA1C 8.3 07/06/2017   HGBA1C 8.3 02/24/2017   HGBA1C 8.4 11/15/2016   HGBA1C 8.7 08/18/2016   HGBA1C 9.4 03/29/2016   Pt is on a regimen of:  - Amaryl 4 mg 2x a day, before meals. ->> stopped 02/2019 as she could not afford it - Basaglar 32 units at bedtime - NovoLog   20-25 units before b'fast or lunch  22-26-28 units before dinner. We had to stop Metformin 2/2 increased creatinine.  Pt checks her sugars twice a day: - am: 81, 95, 113-162, 170 >> 96-139 >> 99-159 >> 90-175, 190 - 2h after b'fast: 166 >> n/c >> 171, 172 >> 95, 114-200 - lunch: 126 >> 196, 265 >> n/c >> 161, 203 >> 189-202 - 2h after lunch: 161, 181 >> 120-150 >> n/c >> 135 - before dinner: 217 >> n/c >> 136 >> 180-216 - later: 166-265 >> 190-300 >> 171-290 >> 216-291 - bedtime: 270-330 Lowest sugar was 95 >> 96 >> 91 >> 90. She has hypoglycemia awareness  in the 70s. Highest 291 >> 300 (forgot medicines) >> 290 >> 330  + CKD stage III; last BUN/Cr: Lab Results  Component Value Date   BUN 25 (H) 12/27/2018   CREATININE 1.46 (H) 12/27/2018   + HL; last set of lipids: Lab Results  Component Value Date   CHOL 207 (H) 12/27/2018   HDL 53.80 12/27/2018   LDLDIRECT 112.0 12/27/2018   TRIG (H) 12/27/2018    404.0 Triglyceride is over 400; calculations on Lipids are invalid.   CHOLHDL 4 12/27/2018  On Zocor 20. Pt's last eye exam was in 04/2019: No DR.-  Dr. Susa Loffler at [facility].  No  numbness and tingling in her feet  ROS: Constitutional: no weight gain/no weight loss, no fatigue, no subjective hyperthermia, no subjective hypothermia Eyes: no blurry vision, no xerophthalmia ENT: no sore throat, no nodules palpated in neck, no dysphagia, no odynophagia, no hoarseness Cardiovascular: no CP/no SOB/no palpitations/no leg swelling Respiratory: no cough/no SOB/no wheezing Gastrointestinal: no N/no V/no D/no C/no acid reflux Musculoskeletal: no muscle aches/no joint aches Skin: no rashes, no hair loss Neurological: no tremors/no numbness/no tingling/no dizziness  I reviewed pt's medications, allergies, PMH, social hx, family hx, and changes were documented in the history of present illness. Otherwise, unchanged from my initial visit note.  Past Medical History:  Diagnosis Date  . Asthma   . DEGENERATIVE JOINT DISEASE, GENERALIZED 02/01/2007   Qualifier: Diagnosis of  By: Burnice Logan  MD, Doretha Sou   . DIARRHEA, CHRONIC 08/20/2009   Qualifier: Diagnosis of  By: Burnice Logan  MD, Doretha Sou   . DM 02/01/2007   Qualifier: Diagnosis of  By: Floyde Parkins    . FATIGUE 10/20/2008   Qualifier: Diagnosis of  By: Burnice Logan  MD, Doretha Sou   . HYPERCHOLESTEROLEMIA 02/01/2007   Qualifier: Diagnosis of  By: Floyde Parkins    . HYPERLIPIDEMIA 12/20/2007   Qualifier: Diagnosis of  By: Burnice Logan  MD, Doretha Sou   .  HYPERTENSION 02/01/2007   Qualifier: Diagnosis of  By: Floyde Parkins    . LOW BACK PAIN 01/13/2010   Qualifier: Diagnosis of  By: Burnice Logan  MD, Doretha Sou MUSCLE CRAMPS 04/06/2010   Qualifier: Diagnosis of  By: Burnice Logan  MD, Doretha Sou   . MYALGIA 01/20/2010   Qualifier: Diagnosis of  By: Elease Hashimoto MD, [place]    . OSTEOARTHRITIS 01/13/2010   Qualifier: Diagnosis of  By: Burnice Logan  MD, Doretha Sou   . OVERACTIVE BLADDER 08/20/2009   Qualifier: Diagnosis of  By: Burnice Logan  MD, Doretha Sou   . PLEURISY 06/18/2008   Qualifier: Diagnosis of  By: Niel Hummer MD, [place] PNEUMONIA 06/18/2008    Qualifier: Diagnosis of  By: Niel Hummer MD, [place] Rectal prolapse 09/20/2007   Qualifier: Diagnosis of  By: Burnice Logan  MD, Doretha Sou   . VITAMIN B12 DEFICIENCY 07/20/2010   Qualifier: Diagnosis of  By: Carlean Purl MD, Dimas Millin    Past Surgical History:  Procedure Laterality Date  . ABDOMINAL HYSTERECTOMY     partial- states she has her ovaries  . APPENDECTOMY    . BLADDER SURGERY    . CATARACT EXTRACTION Bilateral 2012  . CHOLECYSTECTOMY     Social History   Socioeconomic History  . Marital status: Single    Spouse name: Not on file  . Number of children: Not on file  . Years of education: Not on file  . Highest education level: Not on file  Occupational History  . Not on file  Social Needs  . Financial resource strain: Not on file  . Food insecurity    Worry: Not on file    Inability: Not on file  . Transportation needs    Medical: Not on file    Non-medical: Not on file  Tobacco Use  . Smoking status: Former Smoker    Packs/day: 10.00  . Smokeless tobacco: Never Used  . Tobacco comment: quit > 83 yo   Substance and Sexual Activity  . Alcohol use: No  . Drug use: No  . Sexual activity: Not on file  Lifestyle  . Physical activity    Days per week: Not on file    Minutes per session: Not on file  . Stress: Not on file  Relationships  . Social Herbalist on phone: Not on file    Gets together: Not on file    Attends religious service: Not on file    Active member of club or organization: Not on file    Attends meetings of clubs or organizations: Not on file    Relationship status: Not on file  . Intimate partner violence    Fear of current or ex partner: Not on file    Emotionally abused: Not on file    Physically abused: Not on file    Forced sexual activity: Not on file  Other Topics Concern  . Not on file  Social History Narrative   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    Married for 98 years, widowed for 22 years    Current Outpatient Medications on  File Prior to Visit  Medication Sig Dispense Refill  . Acetaminophen 650 MG TABS Take 1 tablet (650 mg total) by mouth 3 (three) times daily as needed. 30 tablet 0  . aspirin 81 MG tablet Take 81 mg by mouth daily.      . calcium-vitamin D (OSCAL WITH D 500-200) 500-200  MG-UNIT per tablet Take 1 tablet by mouth daily.      . cyanocobalamin 1000 MCG tablet Take 100 mcg by mouth daily.      . diphenoxylate-atropine (LOMOTIL) 2.5-0.025 MG tablet Take 1 tablet by mouth 3 (three) times daily as needed for diarrhea or loose stools. 90 tablet 0  . fluticasone (FLONASE) 50 MCG/ACT nasal spray Place 1 spray into the nose daily. 16 g 0  . glimepiride (AMARYL) 4 MG tablet Take 1 tablet (4 mg total) by mouth 2 (two) times daily. 180 tablet 3  . glucose blood (ONE TOUCH ULTRA TEST) test strip Use as instructed to test 3 times daily DX: E11.21 300 each 5  . hydrochlorothiazide (HYDRODIURIL) 25 MG tablet Take 1 tablet (25 mg total) by mouth daily. 90 tablet 3  . ibuprofen (ADVIL,MOTRIN) 600 MG tablet TAKE 1 TABLET BY MOUTH EVERY 6 HOURS AS NEEDED FOR PAIN 60 tablet 1  . Insulin Glargine (BASAGLAR KWIKPEN) 100 UNIT/ML SOPN Use to inject 32 units at bedtime 15 mL 4  . insulin lispro (HUMALOG KWIKPEN) 100 UNIT/ML KwikPen Inject 20-30 units 3 times a day with meals. 45 mL 11  . Insulin Pen Needle (BD PEN NEEDLE NANO U/F) 32G X 4 MM MISC USE TO INJECT INSULIN 4 TIMES DAILY AS DIRECTED. 130 each 11  . Lancets (ONETOUCH ULTRASOFT) lancets Use as instructed to test 3 times daily 100 each 12  . Multiple Vitamin (MULTIVITAMIN) tablet Take 1 tablet by mouth daily.      . simvastatin (ZOCOR) 40 MG tablet Take 1 tablet (40 mg total) by mouth daily. 90 tablet 3  . traZODone (DESYREL) 150 MG tablet Take 1 tablet (150 mg total) by mouth at bedtime as needed. 90 tablet 1  . venlafaxine XR (EFFEXOR-XR) 75 MG 24 hr capsule Take 1 capsule by mouth once daily 90 capsule 1  . Dulaglutide (TRULICITY) 1.5 0000000 SOPN Inject 1.5 mg  into the skin once a week. (Patient not taking: Reported on 04/15/2019) 4 pen 11   No current facility-administered medications on file prior to visit.    Allergies  Allergen Reactions  . Metformin     REACTION: diarrhea  . Sitagliptin Phosphate     REACTION: abdominal pain   Family History  Problem Relation Age of Onset  . Deep vein thrombosis Mother   . COPD Father   . Asthma Father   . Diabetes Neg Hx        family hx  . Cancer Neg Hx        breast ca - sister(S)?  Marland Kitchen Heart disease Neg Hx        family hx   PE: BP 136/80   Pulse 88   Ht 5' 4.5" (1.638 m)   Wt 174 lb (78.9 kg)   SpO2 98%   BMI 29.41 kg/m  Body mass index is 29.41 kg/m. Wt Readings from Last 3 Encounters:  04/15/19 174 lb (78.9 kg)  12/27/18 173 lb (78.5 kg)  11/15/18 174 lb (78.9 kg)   Constitutional: overweight, in NAD Eyes: PERRLA, EOMI, no exophthalmos ENT: moist mucous membranes, no thyromegaly, no cervical lymphadenopathy Cardiovascular: RRR, No RG, +2/6 SEM Respiratory: CTA B Gastrointestinal: abdomen soft, NT, ND, BS+ Musculoskeletal: no deformities, strength intact in all 4 Skin: moist, warm, no rashes Neurological: no tremor with outstretched hands, DTR normal in all 4  ASSESSMENT: 1. DM2, insulin-dependent, uncontrolled, without complications - + nephropathy  We have tried to stop Amaryl, but sugars were higher >>  we restarted it.  2. HL  3.  Overweight  PLAN:  1. Patient with longstanding, uncontrolled, type 2 diabetes, on a complex diabetes regimen containing basal-bolus insulin regimen, sulfonylurea, and weekly GLP-1 receptor agonist.  Sugars were higher than goal at last visits due to eating out with her son and also missing mealtime insulin doses.  We discussed about setting alarms on her phone to remember taking them.  At last visit, sugars were at or close to goal in the morning but they were quite high at night, mostly in the 200s.  She was taking NovoLog after, rather than  before dinner.  We discussed about the importance of taking the medication before meals.  However, we did not change her regimen at that time. -At this visit, sugars are still close to goal in the morning but they increased throughout the day and they are in the 200s to 300s at bedtime.  She is having erratic meals (for example, I saw the patient at the end of the day today and she did not eat anything yet and, also, she did not take NovoLog).  She is eating the majority of her calories at night but does not always cover weight appropriate amount of insulin.  She only took 22 units of NovoLog last night, when the recommended dose was 26-28 units.  Subsequently, sugars were 290 after the meal.  We discussed about the importance of spreading calories throughout the day and also to try to use higher NovoLog doses with dinner since her sugars are still high after this meal.   -Also advised her to patient assistance program for know how to obtain Ozempic high dose, 1 mg weekly.  I advised her that if she is able to start this, she will let me know so I gave her a lower dose of Ozempic sample to start with, to avoid GI symptoms. - I advised her to:   Patient Instructions  Please continue:  - Amaryl 4 mg 2x a day, before meals. - Basaglar 32 units at bedtime - NovoLog   20-25 units before b'fast or lunch  26-28 units before dinner  Please fill out the pt assistance pgm form for Ozempic. Let me know when you get this so I can give you a sample pen of a lower Ozempic dose.   Please return in 4 months with your sugar log  - we checked her HbA1c: 7.3% (slightly higher) - advised to check sugars at different times of the day - 3x a day, rotating check times - advised for yearly eye exams >> she is UTD - UTD with flu shot - return to clinic in 4 months       2. HL - Reviewed latest lipid panel from 12/2018: Triglycerides quite high, LDL above target Lab Results  Component Value Date   CHOL 207 (H)  12/27/2018   HDL 53.80 12/27/2018   LDLDIRECT 112.0 12/27/2018   TRIG (H) 12/27/2018    404.0 Triglyceride is over 400; calculations on Lipids are invalid.   CHOLHDL 4 12/27/2018  - Continues Zocor without side effects.  3.  Overweight -Unfortunately, she had to stop Trulicity, which will also help you with weight loss.  We are trying to start Ozempic with the help of the patient assistance program.  This should also help with weight loss. -stable weight  Philemon Kingdom, MD PhD [facility]

## 2019-04-15 NOTE — Patient Instructions (Addendum)
Please continue:  - Amaryl 4 mg 2x a day, before meals. - Basaglar 32 units at bedtime - NovoLog   20-25 units before b'fast or lunch  26-28 units before dinner  Please fill out the pt assistance pgm form for Ozempic. Let me know when you get this so I can give you a sample pen of a lower Ozempic dose.   Please return in 4 months with your sugar log

## 2019-05-17 DIAGNOSIS — I10 Essential (primary) hypertension: Secondary | ICD-10-CM | POA: Diagnosis not present

## 2019-05-17 DIAGNOSIS — E11630 Type 2 diabetes mellitus with periodontal disease: Secondary | ICD-10-CM | POA: Diagnosis not present

## 2019-05-17 DIAGNOSIS — Z794 Long term (current) use of insulin: Secondary | ICD-10-CM | POA: Diagnosis not present

## 2019-05-17 DIAGNOSIS — F419 Anxiety disorder, unspecified: Secondary | ICD-10-CM | POA: Diagnosis not present

## 2019-05-17 DIAGNOSIS — Z596 Low income: Secondary | ICD-10-CM | POA: Diagnosis not present

## 2019-05-17 DIAGNOSIS — G4700 Insomnia, unspecified: Secondary | ICD-10-CM | POA: Diagnosis not present

## 2019-05-17 DIAGNOSIS — E785 Hyperlipidemia, unspecified: Secondary | ICD-10-CM | POA: Diagnosis not present

## 2019-05-17 DIAGNOSIS — J309 Allergic rhinitis, unspecified: Secondary | ICD-10-CM | POA: Diagnosis not present

## 2019-05-17 DIAGNOSIS — R32 Unspecified urinary incontinence: Secondary | ICD-10-CM | POA: Diagnosis not present

## 2019-05-17 DIAGNOSIS — R69 Illness, unspecified: Secondary | ICD-10-CM | POA: Diagnosis not present

## 2019-07-01 ENCOUNTER — Other Ambulatory Visit: Payer: Self-pay | Admitting: Internal Medicine

## 2019-08-09 ENCOUNTER — Other Ambulatory Visit: Payer: Self-pay | Admitting: Adult Health

## 2019-08-09 DIAGNOSIS — G4700 Insomnia, unspecified: Secondary | ICD-10-CM

## 2019-08-09 DIAGNOSIS — F419 Anxiety disorder, unspecified: Secondary | ICD-10-CM

## 2019-08-14 ENCOUNTER — Other Ambulatory Visit: Payer: Self-pay

## 2019-08-15 ENCOUNTER — Encounter: Payer: Self-pay | Admitting: Internal Medicine

## 2019-08-15 ENCOUNTER — Telehealth: Payer: Self-pay

## 2019-08-15 ENCOUNTER — Ambulatory Visit (INDEPENDENT_AMBULATORY_CARE_PROVIDER_SITE_OTHER): Payer: Medicare HMO | Admitting: Internal Medicine

## 2019-08-15 VITALS — BP 118/70 | HR 97 | Ht 64.0 in | Wt 171.0 lb

## 2019-08-15 DIAGNOSIS — E1121 Type 2 diabetes mellitus with diabetic nephropathy: Secondary | ICD-10-CM | POA: Diagnosis not present

## 2019-08-15 DIAGNOSIS — E7800 Pure hypercholesterolemia, unspecified: Secondary | ICD-10-CM

## 2019-08-15 DIAGNOSIS — E663 Overweight: Secondary | ICD-10-CM | POA: Diagnosis not present

## 2019-08-15 DIAGNOSIS — Z794 Long term (current) use of insulin: Secondary | ICD-10-CM

## 2019-08-15 LAB — POCT GLYCOSYLATED HEMOGLOBIN (HGB A1C): Hemoglobin A1C: 7.4 % — AB (ref 4.0–5.6)

## 2019-08-15 MED ORDER — BD PEN NEEDLE NANO U/F 32G X 4 MM MISC: 3 refills | Status: AC

## 2019-08-15 MED ORDER — ONETOUCH ULTRASOFT LANCETS MISC: 5 refills | Status: AC

## 2019-08-15 MED ORDER — TRULICITY 3 MG/0.5ML ~~LOC~~ SOAJ: 3.0000 mg | SUBCUTANEOUS | 3 refills | Status: DC

## 2019-08-15 MED ORDER — ACCU-CHEK AVIVA PLUS W/DEVICE KIT: PACK | 0 refills | Status: AC

## 2019-08-15 MED ORDER — GLIMEPIRIDE 4 MG PO TABS: 4.0000 mg | ORAL_TABLET | Freq: Two times a day (BID) | ORAL | 3 refills | Status: DC

## 2019-08-15 MED ORDER — GLUCOSE BLOOD VI STRP: ORAL_STRIP | 5 refills | Status: DC

## 2019-08-15 MED ORDER — ACCU-CHEK AVIVA PLUS VI STRP: ORAL_STRIP | 12 refills | Status: AC

## 2019-08-15 NOTE — Addendum Note (Signed)
Addended by: Cardell Peach I on: 08/15/2019 10:19 AM   Modules accepted: Orders

## 2019-08-15 NOTE — Telephone Encounter (Signed)
Insurance no longer covers [employer] supplies, they only cover [employer].  New meter and strips sent.

## 2019-08-15 NOTE — Progress Notes (Signed)
Patient ID: [name] [name], female   DOB: [birth year], 86 y.o.   MRN: [id number]  This visit occurred during the SARS-CoV-2 public health emergency.  Safety protocols were in place, including screening questions prior to the visit, additional usage of staff PPE, and extensive cleaning of exam room while observing appropriate contact time as indicated for disinfecting solutions.   HPI: [name] is a 86 y.o.-year-old female, returning for f/u for DM2, dx 2005, insulin-dependent, uncontrolled, with complications (nephropathy). Last visit 4 months ago.. She has [employer].  She did not fill out the pt. assistance form for Ozempic as she forgot.  However, at this visit, she tells me that she was able to refill the Trulicity again.  She started this 2 weeks ago.  Reviewed HbA1c levels: Lab Results  Component Value Date   HGBA1C 7.3 (A) 04/15/2019   HGBA1C 7.2 11/15/2018   HGBA1C 6.6 (A) 07/16/2018   HGBA1C 6.6 (A) 03/15/2018   HGBA1C 7.2 (A) 12/12/2017   HGBA1C 8.4% 10/05/2017   HGBA1C 8.3 07/06/2017   HGBA1C 8.3 02/24/2017   HGBA1C 8.4 11/15/2016   HGBA1C 8.7 08/18/2016   Pt is on a regimen of: - Amaryl 4 mg 2x a day, before meals. - Basaglar 32 units at bedtime - Trulicity 1.5 mg weekly - NovoLog   20-25 units before b'fast or lunch  26-28 units before dinner  We had to stop Metformin 2/2 increased creatinine. Stopped Trulicity [id number] as she could not afford it anymore.  Pt checks her sugars 2x a day: - am: 96-139 >> 99-159 >> 90-175, 190 >> 87, 99-150, 166 - 2h after b'fast: 171, 172 >> 95, 114-200 >> 112-215 - lunch:n/c >> 161, 203 >> 189-202 >> 150 - 2h after lunch: 120-150 >> n/c >> 135 >> n/c - before dinner:  n/c >> 136 >> 180-216 >> n/c - later: 190-300 >> 171-290 >> 216-291 >> 183 - bedtime: 270-330 >> 110, 117, 179-299, 301 Lowest sugar was 90 >> 87. She has hypoglycemia awareness in the 70s. Highest 330 >> 301  + CKD stage III; last BUN/Cr: Lab  Results  Component Value Date   BUN 25 (H) 12/27/2018   CREATININE 1.46 (H) 12/27/2018   + HL; last set of lipids: Lab Results  Component Value Date   CHOL 207 (H) 12/27/2018   HDL 53.80 12/27/2018   LDLDIRECT 112.0 12/27/2018   TRIG (H) 12/27/2018    404.0 Triglyceride is over 400; calculations on Lipids are invalid.   CHOLHDL 4 12/27/2018  On Zocor 40. Pt's last eye exam was in 04/2019: No DR.-  Dr. Susa Loffler at [facility].  She denies numbness and tingling in her feet  ROS: Constitutional: no weight gain/+ weight loss, no fatigue, no subjective hyperthermia, no subjective hypothermia Eyes: no blurry vision, no xerophthalmia ENT: no sore throat, no nodules palpated in neck, no dysphagia, no odynophagia, no hoarseness Cardiovascular: no CP/no SOB/no palpitations/no leg swelling Respiratory: no cough/no SOB/no wheezing Gastrointestinal: no N/no V/no D/no C/no acid reflux Musculoskeletal: no muscle aches/no joint aches Skin: no rashes, no hair loss Neurological: no tremors/no numbness/no tingling/no dizziness  I reviewed pt's medications, allergies, PMH, social hx, family hx, and changes were documented in the history of present illness. Otherwise, unchanged from my initial visit note.  Past Medical History:  Diagnosis Date  . Asthma   . DEGENERATIVE JOINT DISEASE, GENERALIZED 02/01/2007   Qualifier: Diagnosis of  By: Burnice Logan  MD, Doretha Sou   . DIARRHEA,  CHRONIC 08/20/2009   Qualifier: Diagnosis of  By: Burnice Logan  MD, Doretha Sou   . DM 02/01/2007   Qualifier: Diagnosis of  By: Floyde Parkins    . FATIGUE 10/20/2008   Qualifier: Diagnosis of  By: Burnice Logan  MD, Doretha Sou   . HYPERCHOLESTEROLEMIA 02/01/2007   Qualifier: Diagnosis of  By: Floyde Parkins    . HYPERLIPIDEMIA 12/20/2007   Qualifier: Diagnosis of  By: Burnice Logan  MD, Doretha Sou   . HYPERTENSION 02/01/2007   Qualifier: Diagnosis of  By: Floyde Parkins    . LOW BACK PAIN 01/13/2010   Qualifier:  Diagnosis of  By: Burnice Logan  MD, Doretha Sou MUSCLE CRAMPS 04/06/2010   Qualifier: Diagnosis of  By: Burnice Logan  MD, Doretha Sou   . MYALGIA 01/20/2010   Qualifier: Diagnosis of  By: Elease Hashimoto MD, [place]    . OSTEOARTHRITIS 01/13/2010   Qualifier: Diagnosis of  By: Burnice Logan  MD, Doretha Sou   . OVERACTIVE BLADDER 08/20/2009   Qualifier: Diagnosis of  By: Burnice Logan  MD, Doretha Sou   . PLEURISY 06/18/2008   Qualifier: Diagnosis of  By: Niel Hummer MD, [place] PNEUMONIA 06/18/2008   Qualifier: Diagnosis of  By: Niel Hummer MD, [place] Rectal prolapse 09/20/2007   Qualifier: Diagnosis of  By: Burnice Logan  MD, Doretha Sou   . VITAMIN B12 DEFICIENCY 07/20/2010   Qualifier: Diagnosis of  By: Carlean Purl MD, Dimas Millin    Past Surgical History:  Procedure Laterality Date  . ABDOMINAL HYSTERECTOMY     partial- states she has her ovaries  . APPENDECTOMY    . BLADDER SURGERY    . CATARACT EXTRACTION Bilateral 2012  . CHOLECYSTECTOMY     Social History   Socioeconomic History  . Marital status: Single    Spouse name: Not on file  . Number of children: Not on file  . Years of education: Not on file  . Highest education level: Not on file  Occupational History  . Not on file  Tobacco Use  . Smoking status: Former Smoker    Packs/day: 10.00  . Smokeless tobacco: Never Used  . Tobacco comment: quit > 79 yo   Substance and Sexual Activity  . Alcohol use: No  . Drug use: No  . Sexual activity: Not on file  Other Topics Concern  . Not on file  Social History Narrative   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    Married for 36 years, widowed for 22 years    Social Determinants of Radio broadcast assistant Strain:   . Difficulty of Paying Living Expenses: Not on file  Food Insecurity:   . Worried About Charity fundraiser in the Last Year: Not on file  . Ran Out of Food in the Last Year: Not on file  Transportation Needs:   . Lack of Transportation (Medical): Not on file  . Lack of Transportation  (Non-Medical): Not on file  Physical Activity:   . Days of Exercise per Week: Not on file  . Minutes of Exercise per Session: Not on file  Stress:   . Feeling of Stress : Not on file  Social Connections:   . Frequency of Communication with Friends and Family: Not on file  . Frequency of Social Gatherings with Friends and Family: Not on file  . Attends Religious Services: Not on file  . Active Member of Clubs or Organizations: Not on file  . Attends Club or  Organization Meetings: Not on file  . Marital Status: Not on file  Intimate Partner Violence:   . Fear of Current or Ex-Partner: Not on file  . Emotionally Abused: Not on file  . Physically Abused: Not on file  . Sexually Abused: Not on file   Current Outpatient Medications on File Prior to Visit  Medication Sig Dispense Refill  . Acetaminophen 650 MG TABS Take 1 tablet (650 mg total) by mouth 3 (three) times daily as needed. 30 tablet 0  . aspirin 81 MG tablet Take 81 mg by mouth daily.      . calcium-vitamin D (OSCAL WITH D 500-200) 500-200 MG-UNIT per tablet Take 1 tablet by mouth daily.      . cyanocobalamin 1000 MCG tablet Take 100 mcg by mouth daily.      . diphenoxylate-atropine (LOMOTIL) 2.5-0.025 MG tablet Take 1 tablet by mouth 3 (three) times daily as needed for diarrhea or loose stools. 90 tablet 0  . Dulaglutide (TRULICITY) 1.5 0000000 SOPN Inject 1.5 mg into the skin once a week. (Patient not taking: Reported on 04/15/2019) 4 pen 11  . fluticasone (FLONASE) 50 MCG/ACT nasal spray Place 1 spray into the nose daily. 16 g 0  . glimepiride (AMARYL) 4 MG tablet Take 1 tablet (4 mg total) by mouth 2 (two) times daily. 180 tablet 3  . glucose blood (ONE TOUCH ULTRA TEST) test strip Use as instructed to test 3 times daily DX: E11.21 300 each 5  . hydrochlorothiazide (HYDRODIURIL) 25 MG tablet Take 1 tablet (25 mg total) by mouth daily. 90 tablet 3  . ibuprofen (ADVIL,MOTRIN) 600 MG tablet TAKE 1 TABLET BY MOUTH EVERY 6 HOURS  AS NEEDED FOR PAIN 60 tablet 1  . Insulin Glargine (BASAGLAR KWIKPEN) 100 UNIT/ML SOPN INJECT 32 UNITS SUBCUTANEOUSLY AT BEDTIME 15 mL 0  . insulin lispro (HUMALOG KWIKPEN) 100 UNIT/ML KwikPen Inject 20-30 units 3 times a day with meals. 45 mL 11  . Insulin Pen Needle (BD PEN NEEDLE NANO U/F) 32G X 4 MM MISC USE TO INJECT INSULIN 4 TIMES DAILY AS DIRECTED. 130 each 11  . Lancets (ONETOUCH ULTRASOFT) lancets Use as instructed to test 3 times daily 100 each 12  . Multiple Vitamin (MULTIVITAMIN) tablet Take 1 tablet by mouth daily.      . simvastatin (ZOCOR) 40 MG tablet Take 1 tablet (40 mg total) by mouth daily. 90 tablet 3  . traZODone (DESYREL) 150 MG tablet TAKE 1 TABLET BY MOUTH AT BEDTIME AS NEEDED 90 tablet 0  . venlafaxine XR (EFFEXOR-XR) 75 MG 24 hr capsule Take 1 capsule by mouth once daily 90 capsule 1   No current facility-administered medications on file prior to visit.   Allergies  Allergen Reactions  . Metformin     REACTION: diarrhea  . Sitagliptin Phosphate     REACTION: abdominal pain   Family History  Problem Relation Age of Onset  . Deep vein thrombosis Mother   . COPD Father   . Asthma Father   . Diabetes Neg Hx        family hx  . Cancer Neg Hx        breast ca - sister(S)?  Marland Kitchen Heart disease Neg Hx        family hx   PE: BP 118/70   Pulse 97   Ht 5\' 4"  (1.626 m)   Wt 171 lb (77.6 kg)   SpO2 98%   BMI 29.35 kg/m  Body mass index is 29.35  kg/m. Wt Readings from Last 3 Encounters:  08/15/19 171 lb (77.6 kg)  04/15/19 174 lb (78.9 kg)  12/27/18 173 lb (78.5 kg)   Constitutional: overweight, in NAD Eyes: PERRLA, EOMI, no exophthalmos ENT: moist mucous membranes, no thyromegaly, no cervical lymphadenopathy Cardiovascular: tachycardia, RR, No MRG Respiratory: CTA B Gastrointestinal: abdomen soft, NT, ND, BS+ Musculoskeletal: no deformities, strength intact in all 4 Skin: moist, warm, no rashes Neurological: no tremor with outstretched hands, DTR normal  in all 4  ASSESSMENT: 1. DM2, insulin-dependent, uncontrolled, without complications - + nephropathy  We have tried to stop Amaryl, but sugars were higher >> we restarted it.  2. HL  3.  Overweight  PLAN:  1. Patient with longstanding, uncontrolled, type 2 diabetes, on sulfonylurea and basal-bolus insulin regimen.  We had to stop GLP-1 receptor agonist (Trulicity) last year due to cost.  At last visit I advised her to apply for the patient assistance program for Ozempic.  At that time, sugars were still close to goal in the morning but they were increasing throughout the day, with the highest blood sugars in the 200s to 300s at bedtime.  She was having erratic meals and we discussed about trying to have more scheduled mealtimes.  She is usually eating the majority of her calories at night and not covering them very well with insulin. -At this visit, sugars are still high at night and occasionally after breakfast.  She is not checking in the middle of the day.  She tells me that she was able to refill the Trulicity at 1.5 mg and she started this 2 weeks ago.  There is a slight improvement in her sugars later in the day after she started this.  She is tolerating it well.  I advised her to increase the dose to 3 mg weekly and I sent a prescription for this dose to her pharmacy.  We will keep the rest of her regimen the same. - I advised her to:   Patient Instructions  Please continue: - Amaryl 4 mg 2x a day, before meals. - Basaglar 32 units at bedtime - NovoLog   20-25 units before b'fast or lunch  26-28 units before dinner  Please increase: - Trulicity to 3 mg weekly  Please return in 4 months with your sugar log  - we checked her HbA1c: 7.4% (slightly higher) - advised to check sugars at different times of the day - 3x a day, rotating check times - advised for yearly eye exams >> she is UTD - return to clinic in 4 months      2. HL -Reviewed latest lipid panel from 12/2018:  Triglycerides high, LDL above goal Lab Results  Component Value Date   CHOL 207 (H) 12/27/2018   HDL 53.80 12/27/2018   LDLDIRECT 112.0 12/27/2018   TRIG (H) 12/27/2018    404.0 Triglyceride is over 400; calculations on Lipids are invalid.   CHOLHDL 4 12/27/2018  -Continue Zocor 40 without side effects  3.  Overweight -Unfortunately, she had to stop Trulicity due to cost.  At last visit, we discussed about trying Ozempic through the patient assistance program.  However, since then, she was able to restart Trulicity which we will increase at this visit.  This will help with improving her weight -She did lose 3 pounds since last visit.  Philemon Kingdom, MD PhD [facility] Endocr[facility]

## 2019-08-15 NOTE — Patient Instructions (Signed)
Please continue: - Amaryl 4 mg 2x a day, before meals. - Basaglar 32 units at bedtime - NovoLog   20-25 units before b'fast or lunch  26-28 units before dinner  Please increase: - Trulicity to 3 mg weekly  Please return in 4 months with your sugar log

## 2019-08-22 ENCOUNTER — Other Ambulatory Visit: Payer: Self-pay | Admitting: Internal Medicine

## 2019-09-01 ENCOUNTER — Other Ambulatory Visit: Payer: Self-pay | Admitting: Adult Health

## 2019-09-03 NOTE — Telephone Encounter (Signed)
Sent to the pharmacy by e-scribe. 

## 2019-09-25 ENCOUNTER — Telehealth: Payer: Self-pay | Admitting: Adult Health

## 2019-09-25 NOTE — Telephone Encounter (Signed)
Spoke to [place] and advised that the pt has not authorized a pharmacy transfer.  I will need to hear from the pt before new prescriptions are sent in.  Nothing further needed.

## 2019-09-25 NOTE — Telephone Encounter (Signed)
[facility] called to folow up on these medications.   glimepiride (AMARYL) 4 MG tablet  hydrochlorothiazide (HYDRODIURIL) 25 MG tablet  simvastatin (ZOCOR) 40 MG tablet  traZODone (DESYREL) 150 MG tablet  Please contact the pharmacy at [phone]

## 2019-10-03 ENCOUNTER — Other Ambulatory Visit: Payer: Self-pay | Admitting: Internal Medicine

## 2019-10-15 ENCOUNTER — Other Ambulatory Visit: Payer: Self-pay

## 2019-10-15 MED ORDER — BASAGLAR KWIKPEN 100 UNIT/ML ~~LOC~~ SOPN: 32.0000 [IU] | PEN_INJECTOR | Freq: Every day | SUBCUTANEOUS | 1 refills | Status: DC

## 2019-11-04 ENCOUNTER — Telehealth: Payer: Self-pay

## 2019-11-04 NOTE — Telephone Encounter (Signed)
Prior authorization for Nancee Liter  has been approved by patient's insurance.  Coverage is effective until 07/03/2020   Approval letter has been sent to scanning.

## 2019-11-08 ENCOUNTER — Telehealth: Payer: Self-pay | Admitting: Adult Health

## 2019-11-08 NOTE — Chronic Care Management (AMB) (Signed)
  Chronic Care Management   Outreach Note  11/08/2019 Name: [name] [name] MRN: [id number] DOB: 17-Mar-1929  Referred by: Dorothyann Peng, NP Reason for referral : Chronic Care Management   An unsuccessful telephone outreach was attempted today. The patient was referred to the pharmacist for assistance with care management and care coordination.   Follow Up Plan:   [place]

## 2019-11-12 ENCOUNTER — Other Ambulatory Visit: Payer: Self-pay

## 2019-11-13 ENCOUNTER — Ambulatory Visit (INDEPENDENT_AMBULATORY_CARE_PROVIDER_SITE_OTHER): Payer: Medicare HMO | Admitting: Adult Health

## 2019-11-13 ENCOUNTER — Encounter: Payer: Self-pay | Admitting: Adult Health

## 2019-11-13 VITALS — BP 108/80 | Temp 97.7°F | Wt 167.0 lb

## 2019-11-13 DIAGNOSIS — E538 Deficiency of other specified B group vitamins: Secondary | ICD-10-CM | POA: Diagnosis not present

## 2019-11-13 DIAGNOSIS — R5383 Other fatigue: Secondary | ICD-10-CM

## 2019-11-13 DIAGNOSIS — E559 Vitamin D deficiency, unspecified: Secondary | ICD-10-CM | POA: Diagnosis not present

## 2019-11-13 DIAGNOSIS — G4700 Insomnia, unspecified: Secondary | ICD-10-CM

## 2019-11-13 DIAGNOSIS — E1121 Type 2 diabetes mellitus with diabetic nephropathy: Secondary | ICD-10-CM

## 2019-11-13 DIAGNOSIS — Z794 Long term (current) use of insulin: Secondary | ICD-10-CM

## 2019-11-13 LAB — VITAMIN D 25 HYDROXY (VIT D DEFICIENCY, FRACTURES): VITD: 58.31 ng/mL (ref 30.00–100.00)

## 2019-11-13 LAB — IBC + FERRITIN
Ferritin: 159.8 ng/mL (ref 10.0–291.0)
Iron: 114 ug/dL (ref 42–145)
Saturation Ratios: 33.4 % (ref 20.0–50.0)
Transferrin: 244 mg/dL (ref 212.0–360.0)

## 2019-11-13 LAB — BASIC METABOLIC PANEL
BUN: 31 mg/dL — ABNORMAL HIGH (ref 6–23)
CO2: 32 mEq/L (ref 19–32)
Calcium: 9.4 mg/dL (ref 8.4–10.5)
Chloride: 95 mEq/L — ABNORMAL LOW (ref 96–112)
Creatinine, Ser: 1.74 mg/dL — ABNORMAL HIGH (ref 0.40–1.20)
GFR: 27.59 mL/min — ABNORMAL LOW (ref >60.00)
Glucose, Bld: 168 mg/dL — ABNORMAL HIGH (ref 70–99)
Potassium: 4.6 mEq/L (ref 3.5–5.1)
Sodium: 138 mEq/L (ref 135–145)

## 2019-11-13 LAB — VITAMIN B12: Vitamin B-12: 1063 pg/mL — ABNORMAL HIGH (ref 211–911)

## 2019-11-13 LAB — CBC WITH DIFFERENTIAL/PLATELET
Basophils Absolute: 0.1 10*3/uL (ref 0.0–0.1)
Basophils Relative: 1.2 % (ref 0.0–3.0)
Eosinophils Absolute: 0.3 10*3/uL (ref 0.0–0.7)
Eosinophils Relative: 3.5 % (ref 0.0–5.0)
HCT: 39.4 % (ref 36.0–46.0)
Hemoglobin: 13.4 g/dL (ref 12.0–15.0)
Lymphocytes Relative: 33.6 % (ref 12.0–46.0)
Lymphs Abs: 2.7 10*3/uL (ref 0.7–4.0)
MCHC: 34.1 g/dL (ref 30.0–36.0)
MCV: 95.6 fl (ref 78.0–100.0)
Monocytes Absolute: 0.6 10*3/uL (ref 0.1–1.0)
Monocytes Relative: 7.7 % (ref 3.0–12.0)
Neutro Abs: 4.4 10*3/uL (ref 1.4–7.7)
Neutrophils Relative %: 54 % (ref 43.0–77.0)
Platelets: 235 10*3/uL (ref 150.0–400.0)
RBC: 4.12 Mil/uL (ref 3.87–5.11)
RDW: 13 % (ref 11.5–15.5)
WBC: 8.1 10*3/uL (ref 4.0–10.5)

## 2019-11-13 LAB — TSH: TSH: 1.05 u[IU]/mL (ref 0.35–4.50)

## 2019-11-13 MED ORDER — TRAZODONE HCL 300 MG PO TABS: 300.0000 mg | ORAL_TABLET | Freq: Every day | ORAL | 0 refills | Status: DC

## 2019-11-13 NOTE — Progress Notes (Signed)
Subjective:    Patient ID: [name] [name], female    DOB: 14-Jul-1928, 86 y.o.   MRN: [id number]  HPI 86 year old female who  has a past medical history of Asthma, DEGENERATIVE JOINT DISEASE, GENERALIZED (02/01/2007), DIARRHEA, CHRONIC (08/20/2009), DM (02/01/2007), FATIGUE (10/20/2008), HYPERCHOLESTEROLEMIA (02/01/2007), HYPERLIPIDEMIA (12/20/2007), HYPERTENSION (02/01/2007), LOW BACK PAIN (01/13/2010), MUSCLE CRAMPS (04/06/2010), MYALGIA (01/20/2010), OSTEOARTHRITIS (01/13/2010), OVERACTIVE BLADDER (08/20/2009), PLEURISY (06/18/2008), PNEUMONIA (06/18/2008), Rectal prolapse (09/20/2007), and VITAMIN B12 DEFICIENCY (07/20/2010).  She presents to the office today for generalized fatigue. She reports that " I am just tired all the time". She cannot tell me when the generalized fatigue started to get worse. She thought that her blood sugar may be a contributing cause and her blood sugar yesterday was 500 despite taking all of her medications.This morning her blood sugar was 129 - she does feel a little bit better today.   Lab Results  Component Value Date   HGBA1C 7.4 (A) 08/15/2019    She does have a history of insomnia and takes trazodone 150 mg for this.  When I asked her if she was sleeping she reported, I will take my medication around 10 PM and get into bed, I am lucky if I fall asleep before daybreak.  She does not take naps in the afternoon.  She also leads a pretty sedentary lifestyle and does not do much besides sit at home and play on the computer.  She does have a history of vitamin D and vitamin B12 deficiency for which she takes over-the-counter supplements.  She denies depression, chest pain, or shortness of breath   Review of Systems See HPI   Past Medical History:  Diagnosis Date  . Asthma   . DEGENERATIVE JOINT DISEASE, GENERALIZED 02/01/2007   Qualifier: Diagnosis of  By: Burnice Logan  MD, Doretha Sou   . DIARRHEA, CHRONIC 08/20/2009   Qualifier: Diagnosis of  By: Burnice Logan  MD,  Doretha Sou   . DM 02/01/2007   Qualifier: Diagnosis of  By: Floyde Parkins    . FATIGUE 10/20/2008   Qualifier: Diagnosis of  By: Burnice Logan  MD, Doretha Sou   . HYPERCHOLESTEROLEMIA 02/01/2007   Qualifier: Diagnosis of  By: Floyde Parkins    . HYPERLIPIDEMIA 12/20/2007   Qualifier: Diagnosis of  By: Burnice Logan  MD, Doretha Sou   . HYPERTENSION 02/01/2007   Qualifier: Diagnosis of  By: Floyde Parkins    . LOW BACK PAIN 01/13/2010   Qualifier: Diagnosis of  By: Burnice Logan  MD, Doretha Sou MUSCLE CRAMPS 04/06/2010   Qualifier: Diagnosis of  By: Burnice Logan  MD, Doretha Sou   . MYALGIA 01/20/2010   Qualifier: Diagnosis of  By: Elease Hashimoto MD, [place]    . OSTEOARTHRITIS 01/13/2010   Qualifier: Diagnosis of  By: Burnice Logan  MD, Doretha Sou   . OVERACTIVE BLADDER 08/20/2009   Qualifier: Diagnosis of  By: Burnice Logan  MD, Doretha Sou   . PLEURISY 06/18/2008   Qualifier: Diagnosis of  By: Niel Hummer MD, [place] PNEUMONIA 06/18/2008   Qualifier: Diagnosis of  By: Niel Hummer MD, [place] Rectal prolapse 09/20/2007   Qualifier: Diagnosis of  By: Burnice Logan  MD, Doretha Sou   . VITAMIN B12 DEFICIENCY 07/20/2010   Qualifier: Diagnosis of  By: Carlean Purl MD, Dimas Millin     Social History   Socioeconomic History  . Marital status: Single    Spouse name: Not on file  . Number of children: Not  on file  . Years of education: Not on file  . Highest education level: Not on file  Occupational History  . Not on file  Tobacco Use  . Smoking status: Former Smoker    Packs/day: 10.00  . Smokeless tobacco: Never Used  . Tobacco comment: quit > 45 yo   Substance and Sexual Activity  . Alcohol use: No  . Drug use: No  . Sexual activity: Not on file  Other Topics Concern  . Not on file  Social History Narrative   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    Married for 1 years, widowed for 22 years    Social Determinants of Radio broadcast assistant Strain:   . Difficulty of Paying Living Expenses:   Food Insecurity:   . Worried About Paediatric nurse in the Last Year:   . Arboriculturist in the Last Year:   Transportation Needs:   . Film/video editor (Medical):   Marland Kitchen Lack of Transportation (Non-Medical):   Physical Activity:   . Days of Exercise per Week:   . Minutes of Exercise per Session:   Stress:   . Feeling of Stress :   Social Connections:   . Frequency of Communication with Friends and Family:   . Frequency of Social Gatherings with Friends and Family:   . Attends Religious Services:   . Active Member of Clubs or Organizations:   . Attends Archivist Meetings:   Marland Kitchen Marital Status:   Intimate Partner Violence:   . Fear of Current or Ex-Partner:   . Emotionally Abused:   Marland Kitchen Physically Abused:   . Sexually Abused:     Past Surgical History:  Procedure Laterality Date  . ABDOMINAL HYSTERECTOMY     partial- states she has her ovaries  . APPENDECTOMY    . BLADDER SURGERY    . CATARACT EXTRACTION Bilateral 2012  . CHOLECYSTECTOMY      Family History  Problem Relation Age of Onset  . Deep vein thrombosis Mother   . COPD Father   . Asthma Father   . Diabetes Neg Hx        family hx  . Cancer Neg Hx        breast ca - sister(S)?  Marland Kitchen Heart disease Neg Hx        family hx    Allergies  Allergen Reactions  . Metformin     REACTION: diarrhea  . Sitagliptin Phosphate     REACTION: abdominal pain    Current Outpatient Medications on File Prior to Visit  Medication Sig Dispense Refill  . Acetaminophen 650 MG TABS Take 1 tablet (650 mg total) by mouth 3 (three) times daily as needed. 30 tablet 0  . aspirin 81 MG tablet Take 81 mg by mouth daily.      . Blood Glucose Monitoring Suppl (ACCU-CHEK AVIVA PLUS) w/Device KIT Use to check blood sugar 3 times a day 1 kit 0  . calcium-vitamin D (OSCAL WITH D 500-200) 500-200 MG-UNIT per tablet Take 1 tablet by mouth daily.      . cyanocobalamin 1000 MCG tablet Take 100 mcg by mouth daily.      . diphenoxylate-atropine (LOMOTIL) 2.5-0.025 MG tablet  Take 1 tablet by mouth 3 (three) times daily as needed for diarrhea or loose stools. 90 tablet 0  . Dulaglutide (TRULICITY) 3 YM/4.1RA SOPN Inject 3 mg into the skin once a week. 12 pen 3  . fluticasone (FLONASE) 50 MCG/ACT  nasal spray Place 1 spray into the nose daily. 16 g 0  . glimepiride (AMARYL) 4 MG tablet Take 1 tablet (4 mg total) by mouth 2 (two) times daily. 180 tablet 3  . glucose blood (ACCU-CHEK AVIVA PLUS) test strip Use to check blood sugar 3 times a day. 300 strip 12  . hydrochlorothiazide (HYDRODIURIL) 25 MG tablet Take 1 tablet (25 mg total) by mouth daily. 90 tablet 3  . ibuprofen (ADVIL,MOTRIN) 600 MG tablet TAKE 1 TABLET BY MOUTH EVERY 6 HOURS AS NEEDED FOR PAIN 60 tablet 1  . Insulin Glargine (BASAGLAR KWIKPEN) 100 UNIT/ML Inject 0.32 mLs (32 Units total) into the skin at bedtime. 28.8 mL 1  . Insulin Pen Needle (BD PEN NEEDLE NANO U/F) 32G X 4 MM MISC USE TO INJECT INSULIN 4 TIMES DAILY AS DIRECTED. 200 each 3  . Lancets (ONETOUCH ULTRASOFT) lancets Use as instructed to test 3 times daily 300 each 5  . Multiple Vitamin (MULTIVITAMIN) tablet Take 1 tablet by mouth daily.      Marland Kitchen NOVOLOG FLEXPEN 100 UNIT/ML FlexPen INJECT 20 TO  30 UNITS SUBCUTANEOUSLY THREE TIMES DAILY WITH MEALS 15 mL 2  . simvastatin (ZOCOR) 40 MG tablet Take 1 tablet (40 mg total) by mouth daily. 90 tablet 3  . traZODone (DESYREL) 150 MG tablet TAKE 1 TABLET BY MOUTH AT BEDTIME AS NEEDED 90 tablet 0  . venlafaxine XR (EFFEXOR-XR) 75 MG 24 hr capsule Take 1 capsule by mouth once daily 90 capsule 0   No current facility-administered medications on file prior to visit.    BP 108/80   Temp 97.7 F (36.5 C)   Wt 167 lb (75.8 kg)   BMI 28.67 kg/m       Objective:   Physical Exam Vitals and nursing note reviewed.  Constitutional:      Appearance: Normal appearance.  Cardiovascular:     Rate and Rhythm: Normal rate and regular rhythm.     Pulses: Normal pulses.     Heart sounds: Normal heart  sounds.  Pulmonary:     Effort: Pulmonary effort is normal.     Breath sounds: Normal breath sounds.  Musculoskeletal:        General: Normal range of motion.  Skin:    General: Skin is warm and dry.  Neurological:     General: No focal deficit present.     Mental Status: She is alert and oriented to person, place, and time.  Psychiatric:        Mood and Affect: Mood normal.        Behavior: Behavior normal.        Thought Content: Thought content normal.        Judgment: Judgment normal.       Assessment & Plan:  1. Fatigue, unspecified type -Likely multifactorial due to elevated blood sugar readings, insomnia, and advanced age.  Check labs today. - Basic Metabolic Panel - TSH - IBC + Ferritin - CBC with Differential/Platelet - Vitamin B12 - Vitamin D, 25-hydroxy  2. Insomnia, unspecified type -We will increase trazodone from 150 mg to 300 mg.  She was advised to follow-up if no improvement in her insomnia over the next 7 to 10 days - trazodone (DESYREL) 300 MG tablet; Take 1 tablet (300 mg total) by mouth at bedtime.  Dispense: 90 tablet; Refill: 0  3. Vitamin D deficiency  - Vitamin D, 25-hydroxy  4. Vitamin B12 deficiency  - Vitamin B12  5. Type 2 diabetes mellitus  with diabetic nephropathy, with long-term current use of insulin ([place]) -Encouraged to follow-up with Dr. Cruzita Lederer at her scheduled appointment in June. -Samples of Basaglar and Trulicity given to the patient today - Basic Metabolic Panel   Dorothyann Peng, NP

## 2019-11-13 NOTE — Patient Instructions (Signed)
I think there may be some different contributing factors to your fatigue including high blood sugars and insomnia.   I am going to increase your trazodone to 300 mg a night - I have sent this in   I am also going to do some blood work on you.   Please let me know if you are not getting any more sleep with the increased dose.

## 2019-11-14 ENCOUNTER — Telehealth: Payer: Self-pay | Admitting: Adult Health

## 2019-11-14 NOTE — Telephone Encounter (Signed)
Updated patient on her labs.  Her kidney function did decrease to the GFR of 27 creatinine of 1.7.  Blood pressure was on the low side yesterday I am going to have her discontinue the hydrochlorothiazide, she will monitor her blood pressures at home and follow-up with me in 10 days or sooner if needed.

## 2019-11-20 ENCOUNTER — Telehealth: Payer: Self-pay | Admitting: Adult Health

## 2019-11-20 NOTE — Progress Notes (Signed)
°  Chronic Care Management   Outreach Note  11/20/2019 Name: HOLY BATTENFIELD MRN: [id number] DOB: 1928/09/23  Referred by: Dorothyann Peng, NP Reason for referral : No chief complaint on file.   A second unsuccessful telephone outreach was attempted today. The patient was referred to pharmacist for assistance with care management and care coordination.  Follow Up Plan:   Prathima Ghanta Upstream Scheduler

## 2019-11-27 ENCOUNTER — Other Ambulatory Visit: Payer: Self-pay

## 2019-11-27 MED ORDER — GLIMEPIRIDE 4 MG PO TABS: 4.0000 mg | ORAL_TABLET | Freq: Two times a day (BID) | ORAL | 3 refills | Status: DC

## 2019-11-27 MED ORDER — TRULICITY 3 MG/0.5ML ~~LOC~~ SOAJ: 3.0000 mg | SUBCUTANEOUS | 3 refills | Status: DC

## 2019-11-27 MED ORDER — NOVOLOG FLEXPEN 100 UNIT/ML ~~LOC~~ SOPN: PEN_INJECTOR | SUBCUTANEOUS | 3 refills | Status: DC

## 2019-11-28 DIAGNOSIS — L820 Inflamed seborrheic keratosis: Secondary | ICD-10-CM | POA: Diagnosis not present

## 2019-12-03 ENCOUNTER — Telehealth: Payer: Self-pay | Admitting: Adult Health

## 2019-12-03 ENCOUNTER — Other Ambulatory Visit: Payer: Self-pay

## 2019-12-03 MED ORDER — GLIMEPIRIDE 4 MG PO TABS: 4.0000 mg | ORAL_TABLET | Freq: Two times a day (BID) | ORAL | 3 refills | Status: DC

## 2019-12-03 MED ORDER — TRULICITY 3 MG/0.5ML ~~LOC~~ SOAJ: 3.0000 mg | SUBCUTANEOUS | 3 refills | Status: DC

## 2019-12-03 MED ORDER — NOVOLOG FLEXPEN 100 UNIT/ML ~~LOC~~ SOPN: PEN_INJECTOR | SUBCUTANEOUS | 3 refills | Status: DC

## 2019-12-03 NOTE — Chronic Care Management (AMB) (Signed)
  Chronic Care Management   Note  12/03/2019 Name: [name] [name] MRN: [id number] DOB: March 20, 1929  [name] [name] is a 86 y.o. year old female who is a primary care patient of Dorothyann Peng, NP. I reached out to [name] by phone today in response to a referral sent by [name]'s PCP, Dorothyann Peng, NP.   [name] was given information about Chronic Care Management services today including:  1. CCM service includes personalized support from designated clinical staff supervised by her physician, including individualized plan of care and coordination with other care providers 2. 24/7 contact phone numbers for assistance for urgent and routine care needs. 3. Service will only be billed when office clinical staff spend 20 minutes or more in a month to coordinate care. 4. Only one practitioner may furnish and bill the service in a calendar month. 5. The patient may stop CCM services at any time (effective at the end of the month) by phone call to the office staff.   Patient agreed to services and verbal consent obtained.   Follow up plan:  [place]

## 2019-12-08 ENCOUNTER — Other Ambulatory Visit: Payer: Self-pay | Admitting: Adult Health

## 2019-12-10 NOTE — Telephone Encounter (Signed)
LEFT A MESSAGE FOR A RETURN CALL.  PT NEEDS TO BE SCHEDULED FOR CPX ON/AFTER 12/28/19.

## 2019-12-12 ENCOUNTER — Ambulatory Visit (INDEPENDENT_AMBULATORY_CARE_PROVIDER_SITE_OTHER): Payer: Medicare HMO | Admitting: Internal Medicine

## 2019-12-12 ENCOUNTER — Other Ambulatory Visit: Payer: Self-pay

## 2019-12-12 ENCOUNTER — Encounter: Payer: Self-pay | Admitting: Internal Medicine

## 2019-12-12 VITALS — BP 140/78 | HR 87 | Ht 64.0 in | Wt 170.0 lb

## 2019-12-12 DIAGNOSIS — Z794 Long term (current) use of insulin: Secondary | ICD-10-CM

## 2019-12-12 DIAGNOSIS — E663 Overweight: Secondary | ICD-10-CM | POA: Diagnosis not present

## 2019-12-12 DIAGNOSIS — E7800 Pure hypercholesterolemia, unspecified: Secondary | ICD-10-CM

## 2019-12-12 DIAGNOSIS — E1121 Type 2 diabetes mellitus with diabetic nephropathy: Secondary | ICD-10-CM

## 2019-12-12 LAB — POCT GLYCOSYLATED HEMOGLOBIN (HGB A1C): Hemoglobin A1C: 7.3 % — AB (ref 4.0–5.6)

## 2019-12-12 NOTE — Patient Instructions (Addendum)
Please continue: - Amaryl 4 mg 2x a day, before meals - Trulicity 3 mg weekly - Basaglar 32 units at bedtime - NovoLog   20-25 units before b'fast or lunch  26-28 units before dinner  Please inject 5-8 units of insulin before a snack.  Please return in 4 months with your sugar log.

## 2019-12-12 NOTE — Progress Notes (Signed)
Patient ID: [name] [name], female   DOB: July 20, 1928, 86 y.o.   MRN: [id number]  This visit occurred during the SARS-CoV-2 public health emergency.  Safety protocols were in place, including screening questions prior to the visit, additional usage of staff PPE, and extensive cleaning of exam room while observing appropriate contact time as indicated for disinfecting solutions.   HPI: [name] [name] is a 86 y.o.-year-old female, returning for f/u for DM2, dx 2005, insulin-dependent, uncontrolled, with complications (nephropathy). Last visit 4 months ago. She has [employer].  Reviewed HbA1c levels: Lab Results  Component Value Date   HGBA1C 7.4 (A) 08/15/2019   HGBA1C 7.3 (A) 04/15/2019   HGBA1C 7.2 11/15/2018   HGBA1C 6.6 (A) 07/16/2018   HGBA1C 6.6 (A) 03/15/2018   HGBA1C 7.2 (A) 12/12/2017   HGBA1C 8.4% 10/05/2017   HGBA1C 8.3 07/06/2017   HGBA1C 8.3 02/24/2017   HGBA1C 8.4 11/15/2016   Pt is on a regimen of: - Amaryl 4 mg 2x a day, before meals. - Basaglar 32 units at bedtime - Trulicity 1.5 >> 3 mg weekly - NovoLog   20-25 units before b'fast or lunch  26-28 units before dinner We had to stop Metformin 2/2 increased creatinine. Stopped Trulicity 90/3009 as she could not afford it anymore.  Pt checks her sugars 2x a day: - am: 96-139 >> 99-159 >> 90-175, 190 >> 87, 99-150, 166 >> 115-130 - 2h after b'fast: 171, 172 >> 95, 114-200 >> 112-215 >> 99-173 - lunch:n/c >> 161, 203 >> 189-202 >> 150 >> 180, 223 - 2h after lunch: 120-150 >> n/c >> 135 >> n/c  - before dinner:  n/c >> 136 >> 180-216 >> n/c >> 179 - later: 190-300 >> 171-290 >> 216-291 >> 183 >> 142-224 - bedtime: 270-330 >> 110, 117, 179-299, 301 >> 189--248, 285 (forgot medication) - night: 142, 149 Lowest sugar was 90 >> 87 >> 99. She has hypoglycemia awareness in the 70s. Highest 330 >> 301 >> 285  + CKD stage III; last BUN/Cr: Lab Results  Component Value Date   BUN 31 (H) 11/13/2019    CREATININE 1.74 (H) 11/13/2019   + HL; last set of lipids: Lab Results  Component Value Date   CHOL 207 (H) 12/27/2018   HDL 53.80 12/27/2018   LDLDIRECT 112.0 12/27/2018   TRIG (H) 12/27/2018    404.0 Triglyceride is over 400; calculations on Lipids are invalid.   CHOLHDL 4 12/27/2018  On Zocor 40. Pt's last eye exam was in 04/2019: No DR - Dr. Susa Loffler at [facility].  No numbness and tingling in her feet  ROS: Constitutional: no weight gain/+ weight loss, no fatigue, no subjective hyperthermia, no subjective hypothermia Eyes: no blurry vision, no xerophthalmia ENT: no sore throat, no nodules palpated in neck, no dysphagia, no odynophagia, no hoarseness Cardiovascular: no CP/no SOB/no palpitations/no leg swelling Respiratory: no cough/no SOB/no wheezing Gastrointestinal: no N/no V/no D/no C/no acid reflux Musculoskeletal: no muscle aches/no joint aches Skin: no rashes, no hair loss Neurological: no tremors/no numbness/no tingling/no dizziness  I reviewed pt's medications, allergies, PMH, social hx, family hx, and changes were documented in the history of present illness. Otherwise, unchanged from my initial visit note.  Past Medical History:  Diagnosis Date  . Asthma   . DEGENERATIVE JOINT DISEASE, GENERALIZED 02/01/2007   Qualifier: Diagnosis of  By: Burnice Logan  MD, Doretha Sou   . DIARRHEA, CHRONIC 08/20/2009   Qualifier: Diagnosis of  By: Burnice Logan  MD, Collier Salina  F   . DM 02/01/2007   Qualifier: Diagnosis of  By: Floyde Parkins    . FATIGUE 10/20/2008   Qualifier: Diagnosis of  By: Burnice Logan  MD, Doretha Sou   . HYPERCHOLESTEROLEMIA 02/01/2007   Qualifier: Diagnosis of  By: Floyde Parkins    . HYPERLIPIDEMIA 12/20/2007   Qualifier: Diagnosis of  By: Burnice Logan  MD, Doretha Sou   . HYPERTENSION 02/01/2007   Qualifier: Diagnosis of  By: Floyde Parkins    . LOW BACK PAIN 01/13/2010   Qualifier: Diagnosis of  By: Burnice Logan  MD, Doretha Sou MUSCLE CRAMPS 04/06/2010    Qualifier: Diagnosis of  By: Burnice Logan  MD, Doretha Sou   . MYALGIA 01/20/2010   Qualifier: Diagnosis of  By: Elease Hashimoto MD, [place]    . OSTEOARTHRITIS 01/13/2010   Qualifier: Diagnosis of  By: Burnice Logan  MD, Doretha Sou   . OVERACTIVE BLADDER 08/20/2009   Qualifier: Diagnosis of  By: Burnice Logan  MD, Doretha Sou   . PLEURISY 06/18/2008   Qualifier: Diagnosis of  By: Niel Hummer MD, [place] PNEUMONIA 06/18/2008   Qualifier: Diagnosis of  By: Niel Hummer MD, [place] Rectal prolapse 09/20/2007   Qualifier: Diagnosis of  By: Burnice Logan  MD, Doretha Sou   . VITAMIN B12 DEFICIENCY 07/20/2010   Qualifier: Diagnosis of  By: Carlean Purl MD, Dimas Millin    Past Surgical History:  Procedure Laterality Date  . ABDOMINAL HYSTERECTOMY     partial- states she has her ovaries  . APPENDECTOMY    . BLADDER SURGERY    . CATARACT EXTRACTION Bilateral 2012  . CHOLECYSTECTOMY     Social History   Socioeconomic History  . Marital status: Single    Spouse name: Not on file  . Number of children: Not on file  . Years of education: Not on file  . Highest education level: Not on file  Occupational History  . Not on file  Tobacco Use  . Smoking status: Former Smoker    Packs/day: 10.00  . Smokeless tobacco: Never Used  . Tobacco comment: quit > 4 yo   Substance and Sexual Activity  . Alcohol use: No  . Drug use: No  . Sexual activity: Not on file  Other Topics Concern  . Not on file  Social History Narrative   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    Married for 60 years, widowed for 22 years    Social Determinants of Radio broadcast assistant Strain:   . Difficulty of Paying Living Expenses:   Food Insecurity:   . Worried About Charity fundraiser in the Last Year:   . Arboriculturist in the Last Year:   Transportation Needs:   . Film/video editor (Medical):   Marland Kitchen Lack of Transportation (Non-Medical):   Physical Activity:   . Days of Exercise per Week:   . Minutes of Exercise per Session:   Stress:    . Feeling of Stress :   Social Connections:   . Frequency of Communication with Friends and Family:   . Frequency of Social Gatherings with Friends and Family:   . Attends Religious Services:   . Active Member of Clubs or Organizations:   . Attends Archivist Meetings:   Marland Kitchen Marital Status:   Intimate Partner Violence:   . Fear of Current or Ex-Partner:   . Emotionally Abused:   Marland Kitchen Physically Abused:   . Sexually Abused:  Current Outpatient Medications on File Prior to Visit  Medication Sig Dispense Refill  . Acetaminophen 650 MG TABS Take 1 tablet (650 mg total) by mouth 3 (three) times daily as needed. 30 tablet 0  . aspirin 81 MG tablet Take 81 mg by mouth daily.      . Blood Glucose Monitoring Suppl (ACCU-CHEK AVIVA PLUS) w/Device KIT Use to check blood sugar 3 times a day 1 kit 0  . calcium-vitamin D (OSCAL WITH D 500-200) 500-200 MG-UNIT per tablet Take 1 tablet by mouth daily.      . cyanocobalamin 1000 MCG tablet Take 100 mcg by mouth daily.      . diphenoxylate-atropine (LOMOTIL) 2.5-0.025 MG tablet Take 1 tablet by mouth 3 (three) times daily as needed for diarrhea or loose stools. 90 tablet 0  . Dulaglutide (TRULICITY) 3 QB/1.6XI SOPN Inject 3 mg into the skin once a week. 12 pen 3  . fluticasone (FLONASE) 50 MCG/ACT nasal spray Place 1 spray into the nose daily. 16 g 0  . glimepiride (AMARYL) 4 MG tablet Take 1 tablet (4 mg total) by mouth 2 (two) times daily. 180 tablet 3  . glucose blood (ACCU-CHEK AVIVA PLUS) test strip Use to check blood sugar 3 times a day. 300 strip 12  . hydrochlorothiazide (HYDRODIURIL) 25 MG tablet Take 1 tablet (25 mg total) by mouth daily. 90 tablet 3  . ibuprofen (ADVIL,MOTRIN) 600 MG tablet TAKE 1 TABLET BY MOUTH EVERY 6 HOURS AS NEEDED FOR PAIN 60 tablet 1  . Insulin Glargine (BASAGLAR KWIKPEN) 100 UNIT/ML Inject 0.32 mLs (32 Units total) into the skin at bedtime. 28.8 mL 1  . Insulin Pen Needle (BD PEN NEEDLE NANO U/F) 32G X 4 MM  MISC USE TO INJECT INSULIN 4 TIMES DAILY AS DIRECTED. 200 each 3  . Lancets (ONETOUCH ULTRASOFT) lancets Use as instructed to test 3 times daily 300 each 5  . Multiple Vitamin (MULTIVITAMIN) tablet Take 1 tablet by mouth daily.      Marland Kitchen NOVOLOG FLEXPEN 100 UNIT/ML FlexPen INJECT 20 TO  30 UNITS SUBCUTANEOUSLY THREE TIMES DAILY WITH MEALS 54 mL 3  . simvastatin (ZOCOR) 40 MG tablet Take 1 tablet (40 mg total) by mouth daily. 90 tablet 3  . trazodone (DESYREL) 300 MG tablet Take 1 tablet (300 mg total) by mouth at bedtime. 90 tablet 0  . venlafaxine XR (EFFEXOR-XR) 75 MG 24 hr capsule Take 1 capsule by mouth once daily 90 capsule 0   No current facility-administered medications on file prior to visit.   Allergies  Allergen Reactions  . Metformin     REACTION: diarrhea  . Sitagliptin Phosphate     REACTION: abdominal pain   Family History  Problem Relation Age of Onset  . Deep vein thrombosis Mother   . COPD Father   . Asthma Father   . Diabetes Neg Hx        family hx  . Cancer Neg Hx        breast ca - sister(S)?  Marland Kitchen Heart disease Neg Hx        family hx   PE: BP 140/78   Pulse 87   Ht 5' 4" (1.626 m)   Wt 170 lb (77.1 kg)   SpO2 95%   BMI 29.18 kg/m  Body mass index is 29.18 kg/m. Wt Readings from Last 3 Encounters:  12/12/19 170 lb (77.1 kg)  11/13/19 167 lb (75.8 kg)  08/15/19 171 lb (77.6 kg)   Constitutional: overweight,  in NAD Eyes: PERRLA, EOMI, no exophthalmos ENT: moist mucous membranes, no thyromegaly, no cervical lymphadenopathy Cardiovascular: RRR, No MRG Respiratory: CTA B Gastrointestinal: abdomen soft, NT, ND, BS+ Musculoskeletal: no deformities, strength intact in all 4 Skin: moist, warm, no rashes Neurological: no tremor with outstretched hands, DTR normal in all 4  ASSESSMENT: 1. DM2, insulin-dependent, uncontrolled, without complications - + nephropathy  We have tried to stop Amaryl, but sugars were higher >> we restarted it.  2. HL  3.   Overweight  PLAN:  1. Patient with longstanding, uncontrolled, type 2 diabetes, on sulfonylurea and basal-bolus insulin regimen, to which we added a GLP-1 receptor agonist-she was able to restart this before last visit.  She was having erratic meals in the past and we discussed about trying to have a more scheduled mealtime regimen.  She was also eating the majority of her calories at night and not covering them very well with insulin.  At last visit, sugars were still high at night and occasionally after breakfast.  She was not checking sugars in the middle of the day.  We increase Trulicity to 3 mg daily and did not change the rest of her regimen. -At this visit, she is checking sugars about twice a day, rotating check times.  Sugars are higher in the middle of the day, before lunch and they were also high at bedtime.  She mentions that she does not have a very good diet and she does snack between meals and after dinner.  For now, I advised her to add NovoLog insulin with the snacks, but I think we can continue the current regimen for now.  We did discuss about the importance of a good mealtime schedule. - I advised her to:   Patient Instructions  Please continue: - Amaryl 4 mg 2x a day, before meals - Trulicity 3 mg weekly - Basaglar 32 units at bedtime - NovoLog   20-25 units before b'fast or lunch  26-28 units before dinner  Please inject 5-8 units of insulin before a snack.  Please return in 4 months with your sugar log.  - we checked her HbA1c: 7.3% (slightly lower) - advised to check sugars at different times of the day - 3x a day, rotating check times - advised for yearly eye exams >> she is UTD - return to clinic in 4 months      2. HL -Reviewed latest lipid panel from 12/2018: LDL above goal, triglycerides high: Lab Results  Component Value Date   CHOL 207 (H) 12/27/2018   HDL 53.80 12/27/2018   LDLDIRECT 112.0 12/27/2018   TRIG (H) 12/27/2018    404.0 Triglyceride is  over 400; calculations on Lipids are invalid.   CHOLHDL 4 12/27/2018  -Continue Zocor without side effects  3.  Overweight -Continue Trulicity which should also help with weight loss.  We increased the dose at last visit. -She lost 3 pounds before last visit and 1 pound since last visit  Philemon Kingdom, MD PhD [facility]

## 2019-12-12 NOTE — Addendum Note (Signed)
Addended by: Cardell Peach I on: 12/12/2019 02:43 PM   Modules accepted: Orders

## 2019-12-13 ENCOUNTER — Telehealth: Payer: Self-pay

## 2019-12-13 NOTE — Telephone Encounter (Signed)
Referral form for CGM faxed to [facility].

## 2019-12-17 NOTE — Telephone Encounter (Signed)
Sent to the pharmacy by e-scribe. 

## 2020-01-09 ENCOUNTER — Other Ambulatory Visit: Payer: Self-pay | Admitting: Adult Health

## 2020-01-09 DIAGNOSIS — I10 Essential (primary) hypertension: Secondary | ICD-10-CM

## 2020-01-09 DIAGNOSIS — E7800 Pure hypercholesterolemia, unspecified: Secondary | ICD-10-CM

## 2020-01-09 NOTE — Chronic Care Management (AMB) (Deleted)
Chronic Care Management Pharmacy  Name: [name] [name]  MRN: [id number] DOB: 1928/10/28  Chief Complaint/ HPI  [name] [name],  86 y.o. , female presents for their {Initial/Follow-up:3041532} CCM visit with the clinical pharmacist {CHL HP Upstream Pharm visit JASN:[id number]}.  PCP : Dorothyann Peng, NP  Their chronic conditions include: HTN, hypercholesterolemia, DM II, chronic diarrhea  *** 12/03/19  Office Visits: 11/13/19 OV - fatigue. Multi factorial due to elevated sugar readings, insomnia, and advanced age. Trazodone increased from 150 mg to 300 mg at bedtime.  Consult Visit: 12/12/19 OV (Gherghe, IM) - patient stopped metformin due to increase creatinine. A1c 7.3% - continue amaryl 4 mg 2x/day before meals, trulicity 3 mg weekly, basaglar 32 units at bedtime, novolog 20-25 units before bfast or lunch and 26-28 units before dinner. F/U in 4 months.   Medications: Outpatient Encounter Medications as of 01/16/2020  Medication Sig  . Acetaminophen 650 MG TABS Take 1 tablet (650 mg total) by mouth 3 (three) times daily as needed.  Marland Kitchen aspirin 81 MG tablet Take 81 mg by mouth daily.    . Blood Glucose Monitoring Suppl (ACCU-CHEK AVIVA PLUS) w/Device KIT Use to check blood sugar 3 times a day  . calcium-vitamin D (OSCAL WITH D 500-200) 500-200 MG-UNIT per tablet Take 1 tablet by mouth daily.    . cyanocobalamin 1000 MCG tablet Take 100 mcg by mouth daily.    . diphenoxylate-atropine (LOMOTIL) 2.5-0.025 MG tablet Take 1 tablet by mouth 3 (three) times daily as needed for diarrhea or loose stools.  . Dulaglutide (TRULICITY) 3 PF/7.9KW SOPN Inject 3 mg into the skin once a week.  . fluticasone (FLONASE) 50 MCG/ACT nasal spray Place 1 spray into the nose daily.  Marland Kitchen glimepiride (AMARYL) 4 MG tablet Take 1 tablet (4 mg total) by mouth 2 (two) times daily.  Marland Kitchen glucose blood (ACCU-CHEK AVIVA PLUS) test strip Use to check blood sugar 3 times a day.  . hydrochlorothiazide (HYDRODIURIL) 25  MG tablet Take 1 tablet (25 mg total) by mouth daily.  Marland Kitchen ibuprofen (ADVIL,MOTRIN) 600 MG tablet TAKE 1 TABLET BY MOUTH EVERY 6 HOURS AS NEEDED FOR PAIN  . Insulin Glargine (BASAGLAR KWIKPEN) 100 UNIT/ML Inject 0.32 mLs (32 Units total) into the skin at bedtime.  . Insulin Pen Needle (BD PEN NEEDLE NANO U/F) 32G X 4 MM MISC USE TO INJECT INSULIN 4 TIMES DAILY AS DIRECTED.  . Lancets (ONETOUCH ULTRASOFT) lancets Use as instructed to test 3 times daily  . Multiple Vitamin (MULTIVITAMIN) tablet Take 1 tablet by mouth daily.    Marland Kitchen NOVOLOG FLEXPEN 100 UNIT/ML FlexPen INJECT 20 TO  30 UNITS SUBCUTANEOUSLY THREE TIMES DAILY WITH MEALS  . simvastatin (ZOCOR) 40 MG tablet Take 1 tablet (40 mg total) by mouth daily.  . trazodone (DESYREL) 300 MG tablet Take 1 tablet (300 mg total) by mouth at bedtime.  Marland Kitchen venlafaxine XR (EFFEXOR-XR) 75 MG 24 hr capsule Take 1 capsule by mouth once daily   No facility-administered encounter medications on file as of 01/16/2020.     Current Diagnosis/Assessment:  Goals Addressed   None     {CHL HP Upstream Pharmacy Diagnosis/Assessment:[phone]}   Hypertension   BP today is:  {CHL HP UPSTREAM Pharmacist BP ranges:[phone]}  Office blood pressures are  BP Readings from Last 3 Encounters:  12/12/19 140/78  11/13/19 108/80  08/15/19 118/70    Patient has failed these meds in the past: lisinopril, losartan,  Patient is currently {CHL Controlled/Uncontrolled:[phone]} on the following medications:  .  HCTZ 25 mg 1 tablet daily  Patient checks BP at home {CHL HP BP Monitoring Frequency:[phone]}  Patient home BP readings are ranging: ***  We discussed {CHL HP Upstream Pharmacy [phone]}  Plan  Continue {CHL HP Upstream Pharmacy BUYZJ:[id number]}     Hyperlipidemia   Lipid Panel     Component Value Date/Time   CHOL 207 (H) 12/27/2018 1028   TRIG (H) 12/27/2018 1028    404.0 Triglyceride is over 400; calculations on Lipids are  invalid.   HDL 53.80 12/27/2018 1028   CHOLHDL 4 12/27/2018 1028   VLDL 67.2 (H) 06/01/2017 1527   LDLDIRECT 112.0 12/27/2018 1028     The ASCVD Risk score (Goff DC Jr., et al., 2013) failed to calculate for the following reasons:   The 2013 ASCVD risk score is only valid for ages 69 to 92   Patient has failed these meds in past: None Patient is currently {CHL Controlled/[phone]} on the following medications:  . Simvastatin 40 mg 1 tablet daily  We discussed:  {CHL HP Upstream Pharmacy [phone]}  Plan  Continue {CHL HP Upstream Pharmacy [phone]}   Diabetes   Recent Relevant Labs: Lab Results  Component Value Date/Time   HGBA1C 7.3 (A) 12/12/2019 02:43 PM   HGBA1C 7.4 (A) 08/15/2019 10:19 AM   HGBA1C 7.2 11/15/2018 12:00 AM   HGBA1C 9.1 (H) 06/08/2015 02:00 PM   HGBA1C 8.7 (H) 02/26/2015 03:24 PM   GFR 27.59 (L) 11/13/2019 10:33 AM   GFR 33.85 (L) 12/27/2018 10:28 AM   MICROALBUR 38.4 (H) 06/01/2017 03:27 PM   MICROALBUR 38.4 (H) 06/08/2015 02:00 PM     Checking BG: {CHL HP Blood Glucose Monitoring [phone]}  Recent FBG Readings: *** Recent pre-meal BG readings: *** Recent 2hr PP BG readings:  *** Recent HS BG readings: ***  Patient has failed these meds in past: metformin, lantus, humalog Patient is currently {CHL Controlled/[phone]} on the following medications:  . Trulicity 3 MC/3.7 ml inject 3 mg once weekly . Glimepiride 4 mg 1 tablet twice daily . Basaglar Kwikpen 100 unit/ml 32 units at bedtime . Novolog Flexpen 100 unit/ml 20-30 units 3 times daily with meals  Last diabetic Eye exam:  Lab Results  Component Value Date/Time   HMDIABEYEEXA No Retinopathy 12/07/2016 12:00 AM    Last diabetic Foot exam:  Lab Results  Component Value Date/Time   HMDIABFOOTEX done 01/31/2012 12:00 AM     We discussed: {CHL HP Upstream Pharmacy [phone]}  Plan  Continue {CHL HP Upstream  Pharmacy [phone]}   Chronic Diarrhea   Patient has failed these meds in past: None Patient is currently {CHL Controlled/[phone]} on the following medications:  . Lomotil 2.5-0.025 mg 1 tablet 3 times daily PRN diarrhea or loose stools  We discussed:  ***  Plan  Continue {CHL HP Upstream Pharmacy VOHKG:[id number]}   Anxiety   Patient has failed these meds in past: none Patient is currently {CHL Controlled/[phone]} on the following medications:  . Venlafaxine XR 75 mg 1 capsule daily  We discussed:  ***  Plan  Continue {CHL HP Upstream Pharmacy [phone]}   Insomnia   Patient has failed these meds in past: zolpidem Patient is currently {CHL Controlled/[phone]} on the following medications:  . Trazodone 300 mg 1 tablet at bedtime  We discussed:  ***  Plan  Continue {CHL HP Upstream Pharmacy YELYH:[id number]}   OTCs/Health Maintenance   Patient is currently {CHL Controlled/[phone]} on the following medications: . APAP 650 mg 1 tablet 3 times daily PRN .  Aspirin 81 mg 1 tablet daily . Calcium-Vit D 500 mg/200 unit 1 tablet daily . Vitamin B12 1000 mcg 1 tablet daily . Flonase 50 mcg/act 1 spray into the nose daily . Ibuprofen 600 mg 1 tablet every 6 hrs PRN pain . Multivitamin 1 tablet daily  We discussed:  ***  Plan  Continue {CHL HP Upstream Pharmacy UXLKG:[id number]}    Vaccines   Reviewed and discussed patient's vaccination history.    Immunization History  Administered Date(s) Administered  . Influenza Whole 04/03/2006, 05/09/2007, 03/20/2008, 04/20/2009, 03/12/2010  . Influenza, High Dose Seasonal PF 03/04/2016, 03/15/2018  . Influenza,inj,Quad PF,6+ Mos 03/20/2014  . Influenza-Unspecified 03/11/2015  . Pneumococcal Conjugate-13 03/24/2014, 12/05/2014  . Pneumococcal Polysaccharide-23 07/04/2001  . Td 07/04/2001  . Tdap 04/07/2011  . Zoster 10/28/2010     Plan  Recommended patient receive *** vaccine in *** office.    Medication Management   Pharmacy/Benefits: [facility] / [employer] Adherence:  Pt endorses ***% compliance  We discussed: ***  Plan  {US Pharmacy UYQI:34742}   Follow up: *** month phone visit   Geraldine Contras, PharmD Clinical Pharmacist [facility] [place] [phone]

## 2020-01-12 ENCOUNTER — Other Ambulatory Visit: Payer: Self-pay | Admitting: Adult Health

## 2020-01-14 NOTE — Telephone Encounter (Signed)
DENIED.  NEEDS CPX. 

## 2020-01-16 ENCOUNTER — Telehealth: Payer: Medicare HMO

## 2020-01-16 ENCOUNTER — Other Ambulatory Visit: Payer: Self-pay | Admitting: Adult Health

## 2020-01-21 ENCOUNTER — Telehealth: Payer: Self-pay | Admitting: Adult Health

## 2020-01-21 NOTE — Progress Notes (Signed)
Please have patient call back: [facility] Management   Outreach Note  01/21/2020 Name: [name] [name] MRN: [id number] DOB: September 13, 1928  Referred by: Dorothyann Peng, NP Reason for referral : No chief complaint on file.   Third unsuccessful telephone outreach was attempted today. The patient was referred to the pharmacist for assistance with care management and care coordination.   Follow Up Plan:   Carley Perdue UpStream Scheduler

## 2020-01-22 DIAGNOSIS — E1121 Type 2 diabetes mellitus with diabetic nephropathy: Secondary | ICD-10-CM | POA: Diagnosis not present

## 2020-01-27 ENCOUNTER — Other Ambulatory Visit: Payer: Self-pay | Admitting: Adult Health

## 2020-01-27 DIAGNOSIS — G4700 Insomnia, unspecified: Secondary | ICD-10-CM

## 2020-02-07 ENCOUNTER — Ambulatory Visit (INDEPENDENT_AMBULATORY_CARE_PROVIDER_SITE_OTHER): Payer: Medicare HMO

## 2020-02-07 ENCOUNTER — Ambulatory Visit
Admission: EM | Admit: 2020-02-07 | Discharge: 2020-02-07 | Disposition: A | Discharge status: Home / Self Care | Payer: Medicare HMO | Attending: Physician Assistant | Admitting: Physician Assistant

## 2020-02-07 DIAGNOSIS — J9811 Atelectasis: Secondary | ICD-10-CM | POA: Diagnosis not present

## 2020-02-07 DIAGNOSIS — R05 Cough: Secondary | ICD-10-CM

## 2020-02-07 DIAGNOSIS — R079 Chest pain, unspecified: Secondary | ICD-10-CM | POA: Diagnosis not present

## 2020-02-07 NOTE — ED Triage Notes (Signed)
Pt c/o central sternal CP onset today at approx 1600 to 1630 while shucking corn. Pt has difficulty qualifying/describing pain sensation, "it's like nothing I've ever felt before". Pt states pain resolved with rest when she quit shucking corn. Denies SOB, diaphoresis, radiating pain to jaw/back/arm, n/v, dizziness or other complaint. Pt states she lost a friend yesterday and just realized that they may be bothering her more than she originally thought. Reports this morning CBG was 97 Reports she has had nocturnal coughing when lying down recently. No edema noted to BLE Denies pain at present. EKG performed and results given to Amy Vu.

## 2020-02-07 NOTE — Discharge Instructions (Signed)
Your EKG normal compared to prior ones. Your chest xray was negative. Please continue to monitor symptoms, if this occurs again, please go to the ED for further evaluation. Otherwise, follow up with PCP for recheck.

## 2020-02-07 NOTE — ED Provider Notes (Signed)
[facility]    CSN: [id number] Arrival date & time: 02/07/20  1654      History   Chief Complaint Chief Complaint  Patient presents with   Chest Pain    HPI [name] [name] is a 86 y.o. female.   86 year old female with history of asthma, HLD, HTN, comes in for chest pain she experienced 1 hour prior to arrival that has since resolved.  States at the time, pain was left-sided, described as pounding sensation, lasted for 30 minutes.  Denies current chest pain.  States she was sitting when this happened.  Denies associated shortness of breath, nausea, vomiting, diaphoresis.  Denies radiation of pain.  Denies weakness, dizziness, syncope.  Denies obvious exertional symptoms such as exertional fatigue, exertional chest pain, dyspnea on exertion.  States has had chronic cough that has not worsened.  Denies fever.      Past Medical History:  Diagnosis Date   Asthma    DEGENERATIVE JOINT DISEASE, GENERALIZED 02/01/2007   Qualifier: Diagnosis of  By: Burnice Logan  MD, Doretha Sou    DIARRHEA, CHRONIC 08/20/2009   Qualifier: Diagnosis of  By: Burnice Logan  MD, Doretha Sou    DM 02/01/2007   Qualifier: Diagnosis of  By: Floyde Parkins     FATIGUE 10/20/2008   Qualifier: Diagnosis of  By: Burnice Logan  MD, Doretha Sou    HYPERCHOLESTEROLEMIA 02/01/2007   Qualifier: Diagnosis of  By: Mariam Dollar 12/20/2007   Qualifier: Diagnosis of  By: Burnice Logan  MD, Doretha Sou    HYPERTENSION 02/01/2007   Qualifier: Diagnosis of  By: Floyde Parkins     LOW BACK PAIN 01/13/2010   Qualifier: Diagnosis of  By: Burnice Logan  MD, Doretha Sou    MUSCLE CRAMPS 04/06/2010   Qualifier: Diagnosis of  By: Burnice Logan  MD, Doretha Sou    MYALGIA 01/20/2010   Qualifier: Diagnosis of  By: Elease Hashimoto MD, Bruce     OSTEOARTHRITIS 01/13/2010   Qualifier: Diagnosis of  By: Burnice Logan  MD, Doretha Sou    OVERACTIVE BLADDER 08/20/2009   Qualifier: Diagnosis of  By: Burnice Logan  MD, Doretha Sou    PLEURISY 06/18/2008     Qualifier: Diagnosis of  By: Niel Hummer MD, Lorinda Creed    PNEUMONIA 06/18/2008   Qualifier: Diagnosis of  By: Niel Hummer MD, Willie R    Rectal prolapse 09/20/2007   Qualifier: Diagnosis of  By: Burnice Logan  MD, Doretha Sou    VITAMIN B12 DEFICIENCY 07/20/2010   Qualifier: Diagnosis of  By: Carlean Purl MD, Tonna Boehringer E     Patient Active Problem List   Diagnosis Date Noted   Overweight (BMI 25.0-29.9) 03/15/2018   Type 2 diabetes mellitus with diabetic nephropathy, with long-term current use of insulin ([place]) 06/17/2014   VITAMIN B12 DEFICIENCY 07/20/2010   LOW BACK PAIN 01/13/2010   OVERACTIVE BLADDER 08/20/2009   DIARRHEA, CHRONIC 08/20/2009   Fatigue 10/20/2008   RECTAL PROLAPSE 09/20/2007   HYPERCHOLESTEROLEMIA 02/01/2007   Essential hypertension 02/01/2007   Osteoarthritis 02/01/2007    Past Surgical History:  Procedure Laterality Date   ABDOMINAL HYSTERECTOMY     partial- states she has her ovaries   APPENDECTOMY     BLADDER SURGERY     CATARACT EXTRACTION Bilateral 2012   CHOLECYSTECTOMY      OB History   No obstetric history on file.      Home Medications    Prior to Admission medications   Medication Sig Start Date  End Date Taking? Authorizing Provider  aspirin 81 MG tablet Take 81 mg by mouth daily.     Yes [provider]  Blood Glucose Monitoring Suppl (ACCU-CHEK AVIVA PLUS) w/Device KIT Use to check blood sugar 3 times a day 08/15/19  Yes Philemon Kingdom, MD  Dulaglutide (TRULICITY) 3 GG/8.3MO SOPN Inject 3 mg into the skin once a week. 12/03/19  Yes Philemon Kingdom, MD  glimepiride (AMARYL) 4 MG tablet Take 1 tablet (4 mg total) by mouth 2 (two) times daily. 12/03/19  Yes Philemon Kingdom, MD  Insulin Glargine (BASAGLAR KWIKPEN) 100 UNIT/ML Inject 0.32 mLs (32 Units total) into the skin at bedtime. 10/15/19  Yes Philemon Kingdom, MD  Insulin Pen Needle (BD PEN NEEDLE NANO U/F) 32G X 4 MM MISC USE TO INJECT INSULIN 4 TIMES DAILY AS  DIRECTED. 08/15/19  Yes Philemon Kingdom, MD  simvastatin (ZOCOR) 40 MG tablet Take 1 tablet (40 mg total) by mouth daily. 01/03/19  Yes Nafziger, Tommi Rumps, NP  trazodone (DESYREL) 300 MG tablet TAKE 1 TABLET BY MOUTH AT BEDTIME 01/28/20  Yes Nafziger, Tommi Rumps, NP  venlafaxine XR (EFFEXOR-XR) 75 MG 24 hr capsule Take 1 capsule by mouth once daily 01/17/20  Yes Nafziger, Tommi Rumps, NP  Acetaminophen 650 MG TABS Take 1 tablet (650 mg total) by mouth 3 (three) times daily as needed. 12/19/12   Moreno-Coll, Adlih, MD  calcium-vitamin D (OSCAL WITH D 500-200) 500-200 MG-UNIT per tablet Take 1 tablet by mouth daily.      [provider]  cyanocobalamin 1000 MCG tablet Take 100 mcg by mouth daily.      [provider]  diphenoxylate-atropine (LOMOTIL) 2.5-0.025 MG tablet Take 1 tablet by mouth 3 (three) times daily as needed for diarrhea or loose stools. 05/08/18   Nafziger, Tommi Rumps, NP  fluticasone (FLONASE) 50 MCG/ACT nasal spray Place 1 spray into the nose daily. 12/19/12   Moreno-Coll, Adlih, MD  glucose blood (ACCU-CHEK AVIVA PLUS) test strip Use to check blood sugar 3 times a day. 08/15/19   Philemon Kingdom, MD  hydrochlorothiazide (HYDRODIURIL) 25 MG tablet Take 1 tablet (25 mg total) by mouth daily. 12/27/18   Nafziger, Tommi Rumps, NP  ibuprofen (ADVIL,MOTRIN) 600 MG tablet TAKE 1 TABLET BY MOUTH EVERY 6 HOURS AS NEEDED FOR PAIN 04/03/17   Marletta Lor, MD  Lancets [facility] ULTRASOFT) lancets Use as instructed to test 3 times daily 08/15/19   Philemon Kingdom, MD  Multiple Vitamin (MULTIVITAMIN) tablet Take 1 tablet by mouth daily.      [provider]  NOVOLOG FLEXPEN 100 UNIT/ML FlexPen INJECT 20 TO  30 UNITS SUBCUTANEOUSLY THREE TIMES DAILY WITH MEALS 12/03/19   Philemon Kingdom, MD    Family History Family History  Problem Relation Age of Onset   Deep vein thrombosis Mother    COPD Father    Asthma Father    Diabetes Neg Hx        family hx   Cancer Neg Hx        breast ca -  sister(S)?   Heart disease Neg Hx        family hx    Social History Social History   Tobacco Use   Smoking status: Former Smoker    Packs/day: 10.00   Smokeless tobacco: Never Used   Tobacco comment: quit > 55 yo   Substance Use Topics   Alcohol use: No   Drug use: No     Allergies   Metformin and Sitagliptin phosphate   Review of  Systems Review of Systems  Reason unable to perform ROS: See HPI as above.     Physical Exam Triage Vital Signs ED Triage Vitals [02/07/20 1700]  Enc Vitals Group     BP 128/76     Pulse Rate 83     Resp 20     Temp 98.1 F (36.7 C)     Temp Source Oral     SpO2 94 %     Weight      Height      Head Circumference      Peak Flow      Pain Score      Pain Loc      Pain Edu?      Excl. in [place]?    No data found.  Updated Vital Signs BP 128/76 (BP Location: Left Arm)    Pulse 83    Temp 98.1 F (36.7 C) (Oral)    Resp 20    SpO2 94%   Visual Acuity Right Eye Distance:   Left Eye Distance:   Bilateral Distance:    Right Eye Near:   Left Eye Near:    Bilateral Near:     Physical Exam Constitutional:      General: She is not in acute distress.    Appearance: Normal appearance. She is well-developed. She is not toxic-appearing or diaphoretic.  HENT:     Head: Normocephalic and atraumatic.  Eyes:     Conjunctiva/sclera: Conjunctivae normal.     Pupils: Pupils are equal, round, and reactive to light.  Cardiovascular:     Rate and Rhythm: Normal rate and regular rhythm.     Heart sounds: No murmur heard.  No friction rub. No gallop.   Pulmonary:     Effort: Pulmonary effort is normal. No respiratory distress.     Comments: Bilateral lower lobe crackles Chest:     Chest wall: No tenderness.  Musculoskeletal:     Cervical back: Normal range of motion and neck supple.     Right lower leg: No tenderness. No edema.     Left lower leg: No tenderness. No edema.  Skin:    General: Skin is warm and dry.  Neurological:      Mental Status: She is alert and oriented to person, place, and time.      UC Treatments / Results  Labs (all labs ordered are listed, but only abnormal results are displayed) Labs Reviewed - No data to display  EKG   Radiology DG Chest 2 View  Result Date: 02/07/2020 CLINICAL DATA:  Cough and chest pain EXAM: CHEST - 2 VIEW COMPARISON:  08/27/2016 FINDINGS: Marked elevation right hemidiaphragm is chronic and unchanged. There is mild right lower lobe atelectasis also unchanged Left lung is clear.  Heart size and vascularity normal. IMPRESSION: No active cardiopulmonary disease. Electronically Signed   By: Franchot Gallo M.D.   On: 02/07/2020 17:46    Procedures Procedures (including critical care time)  Medications Ordered in UC Medications - No data to display  Initial Impression / Assessment and Plan / UC Course  I have reviewed the triage vital signs and the nursing notes.  Pertinent labs & imaging results that were available during my care of the patient were reviewed by me and considered in my medical decision making (see chart for details).    Patient currently symptom-free. EKG NSR, 74bpm, no acute ST changes compared to prior. Given crackles to the lower lobes with cough, will obtain CXR for further  evaluation.   CXR without active cardiopulmonary disease. Patient continues to be symptom free throughout visit. Will have patient monitor. Strict return precautions given. Otherwise, follow up with PCP for recheck. Patient expresses understanding and agrees to plan.  Final Clinical Impressions(s) / UC Diagnoses   Final diagnoses:  Chest pain, unspecified type   ED Prescriptions    None     PDMP not reviewed this encounter.   Ok Edwards, PA-C 02/07/20 1758

## 2020-02-24 ENCOUNTER — Other Ambulatory Visit: Payer: Self-pay | Admitting: Adult Health

## 2020-02-24 DIAGNOSIS — E7800 Pure hypercholesterolemia, unspecified: Secondary | ICD-10-CM

## 2020-03-04 ENCOUNTER — Other Ambulatory Visit: Payer: Self-pay | Admitting: Adult Health

## 2020-03-04 ENCOUNTER — Other Ambulatory Visit: Payer: Self-pay | Admitting: Internal Medicine

## 2020-03-04 DIAGNOSIS — E7800 Pure hypercholesterolemia, unspecified: Secondary | ICD-10-CM

## 2020-04-08 ENCOUNTER — Other Ambulatory Visit: Payer: Self-pay

## 2020-04-08 ENCOUNTER — Encounter: Payer: Self-pay | Admitting: Adult Health

## 2020-04-08 ENCOUNTER — Ambulatory Visit (INDEPENDENT_AMBULATORY_CARE_PROVIDER_SITE_OTHER): Payer: Medicare HMO | Admitting: Adult Health

## 2020-04-08 VITALS — BP 136/74 | HR 90 | Temp 98.6°F | Resp 13 | Ht 64.0 in | Wt 171.0 lb

## 2020-04-08 DIAGNOSIS — E7800 Pure hypercholesterolemia, unspecified: Secondary | ICD-10-CM

## 2020-04-08 DIAGNOSIS — Z794 Long term (current) use of insulin: Secondary | ICD-10-CM | POA: Diagnosis not present

## 2020-04-08 DIAGNOSIS — E1121 Type 2 diabetes mellitus with diabetic nephropathy: Secondary | ICD-10-CM | POA: Diagnosis not present

## 2020-04-08 DIAGNOSIS — Z0000 Encounter for general adult medical examination without abnormal findings: Secondary | ICD-10-CM

## 2020-04-08 DIAGNOSIS — I10 Essential (primary) hypertension: Secondary | ICD-10-CM

## 2020-04-08 DIAGNOSIS — G4700 Insomnia, unspecified: Secondary | ICD-10-CM | POA: Diagnosis not present

## 2020-04-08 DIAGNOSIS — Z23 Encounter for immunization: Secondary | ICD-10-CM

## 2020-04-08 NOTE — Progress Notes (Signed)
Subjective:    Patient ID: [name] [name], female    DOB: December 23, 1928, 86 y.o.   MRN: [id number]  HPI  Patient presents for yearly preventative medicine examination. She is a pleasant 86 year old female who  has a past medical history of Asthma, DEGENERATIVE JOINT DISEASE, GENERALIZED (02/01/2007), DIARRHEA, CHRONIC (08/20/2009), DM (02/01/2007), FATIGUE (10/20/2008), HYPERCHOLESTEROLEMIA (02/01/2007), HYPERLIPIDEMIA (12/20/2007), HYPERTENSION (02/01/2007), LOW BACK PAIN (01/13/2010), MUSCLE CRAMPS (04/06/2010), MYALGIA (01/20/2010), OSTEOARTHRITIS (01/13/2010), OVERACTIVE BLADDER (08/20/2009), PLEURISY (06/18/2008), PNEUMONIA (06/18/2008), Rectal prolapse (09/20/2007), and VITAMIN B12 DEFICIENCY (07/20/2010).  DM -is managed by endocrinology.  She is currently prescribed Trulicity 3 mg, Amaryl 4 mg twice daily, Basaglar 32 units at bedtime, and NovoLog 20 to 30 units 3 times daily.  She has not been eating healthy reports that "my blood sugars are probably go to be high".  Hyperlipidemia-simvastatin 20 mg nightly and a daily 81 mg aspirin.  She denies myalgia or fatigue  Essential hypertension-is controlled with HCTZ 25 mg.  She denies dizziness, lightheadedness, blurred vision, headaches, or syncopal episodes  Anxiety-takes Effexor 75 mg daily and trazodone 300 mg at bedtime to help her sleep.  She continues to report issues with trouble sleeping where she cannot fall asleep till the early morning on some occasions. does not want to change medication.  She feels like her anxiety is well controlled   All immunizations and health maintenance protocols were reviewed with the patient and needed orders were placed.  Appropriate screening laboratory values were ordered for the patient including screening of hyperlipidemia, renal function and hepatic function.   Medication reconciliation,  past medical history, social history, problem list and allergies were reviewed in detail with the patient  Goals were  established with regard to weight loss, exercise, and  diet in compliance with medications  Wt Readings from Last 3 Encounters:  04/08/20 171 lb (77.6 kg)  12/12/19 170 lb (77.1 kg)  11/13/19 167 lb (75.8 kg)     End of life planning was discussed.  She has no acute complaints   Review of Systems  Constitutional: Negative.   HENT: Negative.   Eyes: Negative.   Respiratory: Negative.   Cardiovascular: Negative.   Gastrointestinal: Negative.   Endocrine: Negative.   Genitourinary: Negative.   Musculoskeletal: Positive for arthralgias.  Skin: Negative.   Allergic/Immunologic: Negative.   Neurological: Negative.   Hematological: Negative.   Psychiatric/Behavioral: Negative.    Past Medical History:  Diagnosis Date   Asthma    DEGENERATIVE JOINT DISEASE, GENERALIZED 02/01/2007   Qualifier: Diagnosis of  By: Burnice Logan  MD, Doretha Sou    DIARRHEA, CHRONIC 08/20/2009   Qualifier: Diagnosis of  By: Burnice Logan  MD, Doretha Sou    DM 02/01/2007   Qualifier: Diagnosis of  By: Floyde Parkins     FATIGUE 10/20/2008   Qualifier: Diagnosis of  By: Burnice Logan  MD, Doretha Sou    HYPERCHOLESTEROLEMIA 02/01/2007   Qualifier: Diagnosis of  By: Mariam Dollar 12/20/2007   Qualifier: Diagnosis of  By: Burnice Logan  MD, Doretha Sou    HYPERTENSION 02/01/2007   Qualifier: Diagnosis of  By: Russella Dar BACK PAIN 01/13/2010   Qualifier: Diagnosis of  By: Burnice Logan  MD, Doretha Sou    MUSCLE CRAMPS 04/06/2010   Qualifier: Diagnosis of  By: Burnice Logan  MD, Doretha Sou    MYALGIA 01/20/2010   Qualifier: Diagnosis of  By: Elease Hashimoto MD, Bruce     OSTEOARTHRITIS 01/13/2010   Qualifier:  Diagnosis of  By: Burnice Logan  MD, Doretha Sou    OVERACTIVE BLADDER 08/20/2009   Qualifier: Diagnosis of  By: Burnice Logan  MD, Doretha Sou    PLEURISY 06/18/2008   Qualifier: Diagnosis of  By: Niel Hummer MD, Lorinda Creed    PNEUMONIA 06/18/2008   Qualifier: Diagnosis of  By: Niel Hummer MD, Lorinda Creed    Rectal  prolapse 09/20/2007   Qualifier: Diagnosis of  By: Burnice Logan  MD, Doretha Sou    VITAMIN B12 DEFICIENCY 07/20/2010   Qualifier: Diagnosis of  By: Carlean Purl MD, Dimas Millin     Social History   Socioeconomic History   Marital status: Single    Spouse name: Not on file   Number of children: Not on file   Years of education: Not on file   Highest education level: Not on file  Occupational History   Not on file  Tobacco Use   Smoking status: Former Smoker    Packs/day: 10.00   Smokeless tobacco: Never Used   Tobacco comment: quit > 43 yo   Substance and Sexual Activity   Alcohol use: No   Drug use: No   Sexual activity: Not on file  Other Topics Concern   Not on file  Social History Narrative   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    Married for 56 years, widowed for 22 years    Social Determinants of Health   Financial Resource Strain:    Difficulty of Paying Living Expenses: Not on file  Food Insecurity:    Worried About [place] in the Last Year: Not on file   [employer] of Food in the Last Year: Not on file  Transportation Needs:    Lack of Transportation (Medical): Not on file   Lack of Transportation (Non-Medical): Not on file  Physical Activity:    Days of Exercise per Week: Not on file   Minutes of Exercise per Session: Not on file  Stress:    Feeling of Stress : Not on file  Social Connections:    Frequency of Communication with Friends and Family: Not on file   Frequency of Social Gatherings with Friends and Family: Not on file   Attends Religious Services: Not on file   Active Member of Clubs or Organizations: Not on file   Attends Archivist Meetings: Not on file   Marital Status: Not on file  Intimate Partner Violence:    Fear of Current or Ex-Partner: Not on file   Emotionally Abused: Not on file   Physically Abused: Not on file   Sexually Abused: Not on file    Past Surgical History:  Procedure Laterality Date    ABDOMINAL HYSTERECTOMY     partial- states she has her ovaries   APPENDECTOMY     BLADDER SURGERY     CATARACT EXTRACTION Bilateral 2012   CHOLECYSTECTOMY      Family History  Problem Relation Age of Onset   Deep vein thrombosis Mother    COPD Father    Asthma Father    Diabetes Neg Hx        family hx   Cancer Neg Hx        breast ca - sister(S)?   Heart disease Neg Hx        family hx    Allergies  Allergen Reactions   Metformin     REACTION: diarrhea   Sitagliptin Phosphate     REACTION: abdominal pain    Current  Outpatient Medications on File Prior to Visit  Medication Sig Dispense Refill   Acetaminophen 650 MG TABS Take 1 tablet (650 mg total) by mouth 3 (three) times daily as needed. 30 tablet 0   aspirin 81 MG tablet Take 81 mg by mouth daily.       Blood Glucose Monitoring Suppl (ACCU-CHEK AVIVA PLUS) w/Device KIT Use to check blood sugar 3 times a day 1 kit 0   calcium-vitamin D (OSCAL WITH D 500-200) 500-200 MG-UNIT per tablet Take 1 tablet by mouth daily.       cyanocobalamin 1000 MCG tablet Take 100 mcg by mouth daily.       diphenoxylate-atropine (LOMOTIL) 2.5-0.025 MG tablet Take 1 tablet by mouth 3 (three) times daily as needed for diarrhea or loose stools. 90 tablet 0   Dulaglutide (TRULICITY) 3 WG/6.6ZL SOPN Inject 3 mg into the skin once a week. 12 pen 3   fluticasone (FLONASE) 50 MCG/ACT nasal spray Place 1 spray into the nose daily. 16 g 0   glimepiride (AMARYL) 4 MG tablet Take 1 tablet (4 mg total) by mouth 2 (two) times daily. 180 tablet 3   glucose blood (ACCU-CHEK AVIVA PLUS) test strip Use to check blood sugar 3 times a day. 300 strip 12   hydrochlorothiazide (HYDRODIURIL) 25 MG tablet Take 1 tablet (25 mg total) by mouth daily. 90 tablet 3   ibuprofen (ADVIL,MOTRIN) 600 MG tablet TAKE 1 TABLET BY MOUTH EVERY 6 HOURS AS NEEDED FOR PAIN 60 tablet 1   Insulin Glargine (BASAGLAR KWIKPEN) 100 UNIT/ML INJECT 32 UNITS  SUBCUTANEOUSLY AT BEDTIME 30 mL 0   Insulin Pen Needle (BD PEN NEEDLE NANO U/F) 32G X 4 MM MISC USE TO INJECT INSULIN 4 TIMES DAILY AS DIRECTED. 200 each 3   Lancets (ONETOUCH ULTRASOFT) lancets Use as instructed to test 3 times daily 300 each 5   Multiple Vitamin (MULTIVITAMIN) tablet Take 1 tablet by mouth daily.       NOVOLOG FLEXPEN 100 UNIT/ML FlexPen INJECT 20 TO  30 UNITS SUBCUTANEOUSLY THREE TIMES DAILY WITH MEALS 54 mL 3   simvastatin (ZOCOR) 40 MG tablet Take 1 tablet by mouth once daily 90 tablet 0   trazodone (DESYREL) 300 MG tablet TAKE 1 TABLET BY MOUTH AT BEDTIME 90 tablet 1   venlafaxine XR (EFFEXOR-XR) 75 MG 24 hr capsule Take 1 capsule by mouth once daily 90 capsule 1   No current facility-administered medications on file prior to visit.    BP 136/74 (BP Location: Left Arm, Patient Position: Sitting, Cuff Size: Small)    Pulse 90    Temp 98.6 F (37 C) (Oral)    Resp 13    Ht 5' 4"  (1.626 m)    Wt 171 lb (77.6 kg)    SpO2 99%    BMI 29.35 kg/m        Objective:   Physical Exam Vitals and nursing note reviewed.  Constitutional:      General: She is not in acute distress.    Appearance: Normal appearance. She is well-developed and overweight. She is not ill-appearing.  HENT:     Head: Normocephalic and atraumatic.     Right Ear: Tympanic membrane, ear canal and external ear normal. There is no impacted cerumen.     Left Ear: Tympanic membrane, ear canal and external ear normal. There is no impacted cerumen.     Nose: Nose normal. No congestion or rhinorrhea.     Mouth/Throat:     Mouth:  Mucous membranes are moist.     Pharynx: Oropharynx is clear. No oropharyngeal exudate or posterior oropharyngeal erythema.  Eyes:     General:        Right eye: No discharge.        Left eye: No discharge.     Extraocular Movements: Extraocular movements intact.     Conjunctiva/sclera: Conjunctivae normal.     Pupils: Pupils are equal, round, and reactive to light.    Neck:     Thyroid: No thyromegaly.     Vascular: No carotid bruit.     Trachea: No tracheal deviation.  Cardiovascular:     Rate and Rhythm: Normal rate and regular rhythm.     Pulses: Normal pulses.     Heart sounds: Normal heart sounds. No murmur heard.  No friction rub. No gallop.   Pulmonary:     Effort: Pulmonary effort is normal. No respiratory distress.     Breath sounds: Normal breath sounds. No stridor. No wheezing, rhonchi or rales.  Chest:     Chest wall: No tenderness.  Abdominal:     General: Abdomen is flat. Bowel sounds are normal. There is no distension.     Palpations: Abdomen is soft. There is no mass.     Tenderness: There is no abdominal tenderness. There is no right CVA tenderness, left CVA tenderness, guarding or rebound.     Hernia: No hernia is present.  Musculoskeletal:        General: No swelling, tenderness, deformity or signs of injury. Normal range of motion.     Cervical back: Normal range of motion and neck supple.     Right lower leg: No edema.     Left lower leg: No edema.  Lymphadenopathy:     Cervical: No cervical adenopathy.  Skin:    General: Skin is warm and dry.     Coloration: Skin is not jaundiced or pale.     Findings: No bruising, erythema, lesion or rash.  Neurological:     General: No focal deficit present.     Mental Status: She is alert and oriented to person, place, and time.     Cranial Nerves: No cranial nerve deficit.     Sensory: No sensory deficit.     Motor: No weakness.     Coordination: Coordination normal.     Gait: Gait normal.     Deep Tendon Reflexes: Reflexes normal.  Psychiatric:        Mood and Affect: Mood normal.        Behavior: Behavior normal.        Thought Content: Thought content normal.        Judgment: Judgment normal.       Assessment & Plan:  1. Routine general medical examination at a health care facility - Follow up in one year or sooner if needed - Would like her to focus on diet to help  control her DM better.  - Lipid panel; Future - CBC with Differential/Platelet; Future - CMP with eGFR([facility]); Future - CMP with eGFR([facility]) - CBC with Differential/Platelet - Lipid panel  2. HYPERCHOLESTEROLEMIA - Consider increase in statin  - Lipid panel; Future - CBC with Differential/Platelet; Future - CMP with eGFR([facility]); Future - CMP with eGFR([facility]) - CBC with Differential/Platelet - Lipid panel  3. Influenza vaccine needed  - Flu Vaccine QUAD High Dose(Fluad)  4. Essential hypertension -No change in medication well-controlled - Lipid panel; Future - CBC with Differential/Platelet; Future - CMP with  eGFR([facility]); Future - CMP with eGFR([facility]) - CBC with Differential/Platelet - Lipid panel  5. Insomnia, unspecified type -Continue with trazodone 300 mg nightly  6. Type 2 diabetes mellitus with diabetic nephropathy, with long-term current use of insulin ([facility]) -Enourage heart healthy diet.  Active.  Follow-up with endocrinology as directed - Lipid panel; Future - CBC with Differential/Platelet; Future - CMP with eGFR([facility]); Future - CMP with eGFR([facility]) - CBC with Differential/Platelet - Lipid panel  Dorothyann Peng, NP

## 2020-04-09 ENCOUNTER — Encounter: Payer: Self-pay | Admitting: Adult Health

## 2020-04-09 LAB — CBC WITH DIFFERENTIAL/PLATELET
Absolute Monocytes: 435 cells/uL (ref 200–950)
Basophils Absolute: 87 cells/uL (ref 0–200)
Basophils Relative: 1.1 %
Eosinophils Absolute: 300 cells/uL (ref 15–500)
Eosinophils Relative: 3.8 %
HCT: 38 % (ref 35.0–45.0)
Hemoglobin: 12.4 g/dL (ref 11.7–15.5)
Lymphs Abs: 3215 cells/uL (ref 850–3900)
MCH: 31.4 pg (ref 27.0–33.0)
MCHC: 32.6 g/dL (ref 32.0–36.0)
MCV: 96.2 fL (ref 80.0–100.0)
MPV: 10.3 fL (ref 7.5–12.5)
Monocytes Relative: 5.5 %
Neutro Abs: 3863 cells/uL (ref 1500–7800)
Neutrophils Relative %: 48.9 %
Platelets: 232 10*3/uL (ref 140–400)
RBC: 3.95 10*6/uL (ref 3.80–5.10)
RDW: 12.1 % (ref 11.0–15.0)
Total Lymphocyte: 40.7 %
WBC: 7.9 10*3/uL (ref 3.8–10.8)

## 2020-04-09 LAB — COMPLETE METABOLIC PANEL WITH GFR
AG Ratio: 1.1 (calc) (ref 1.0–2.5)
ALT: 16 U/L (ref 6–29)
AST: 26 U/L (ref 10–35)
Albumin: 3.8 g/dL (ref 3.6–5.1)
Alkaline phosphatase (APISO): 43 U/L (ref 37–153)
BUN/Creatinine Ratio: 12 (calc) (ref 6–22)
BUN: 22 mg/dL (ref 7–25)
CO2: 27 mmol/L (ref 20–32)
Calcium: 9.8 mg/dL (ref 8.6–10.4)
Chloride: 100 mmol/L (ref 98–110)
Creat: 1.81 mg/dL — ABNORMAL HIGH (ref 0.60–0.88)
GFR, Est African American: 28 mL/min/{1.73_m2} — ABNORMAL LOW (ref >=60)
GFR, Est Non African American: 25 mL/min/{1.73_m2} — ABNORMAL LOW (ref >=60)
Globulin: 3.4 g/dL (calc) (ref 1.9–3.7)
Glucose, Bld: 274 mg/dL — ABNORMAL HIGH (ref 65–99)
Potassium: 5.6 mmol/L — ABNORMAL HIGH (ref 3.5–5.3)
Sodium: 139 mmol/L (ref 135–146)
Total Bilirubin: 0.3 mg/dL (ref 0.2–1.2)
Total Protein: 7.2 g/dL (ref 6.1–8.1)

## 2020-04-09 LAB — LIPID PANEL
Cholesterol: 162 mg/dL (ref <200)
HDL: 60 mg/dL (ref >=50)
LDL Cholesterol (Calc): 65 mg/dL (calc)
Non-HDL Cholesterol (Calc): 102 mg/dL (calc) (ref <130)
Total CHOL/HDL Ratio: 2.7 (calc) (ref <5.0)
Triglycerides: 284 mg/dL — ABNORMAL HIGH (ref <150)

## 2020-04-09 LAB — SPECIMEN COMPROMISED

## 2020-04-10 ENCOUNTER — Encounter: Payer: Self-pay | Admitting: Internal Medicine

## 2020-04-10 DIAGNOSIS — Z961 Presence of intraocular lens: Secondary | ICD-10-CM | POA: Diagnosis not present

## 2020-04-10 DIAGNOSIS — H52203 Unspecified astigmatism, bilateral: Secondary | ICD-10-CM | POA: Diagnosis not present

## 2020-04-10 DIAGNOSIS — E119 Type 2 diabetes mellitus without complications: Secondary | ICD-10-CM | POA: Diagnosis not present

## 2020-04-10 LAB — HM DIABETES EYE EXAM

## 2020-04-13 ENCOUNTER — Ambulatory Visit (INDEPENDENT_AMBULATORY_CARE_PROVIDER_SITE_OTHER): Payer: Medicare HMO | Admitting: Internal Medicine

## 2020-04-13 DIAGNOSIS — Z5329 Procedure and treatment not carried out because of patient's decision for other reasons: Secondary | ICD-10-CM

## 2020-04-13 NOTE — Progress Notes (Signed)
Patient ID: [name] [name], female   DOB: 1928/09/08, 86 y.o.   MRN: [id number]   NO SHOW  This visit occurred during the SARS-CoV-2 public health emergency.  Safety protocols were in place, including screening questions prior to the visit, additional usage of staff PPE, and extensive cleaning of exam room while observing appropriate contact time as indicated for disinfecting solutions.   HPI: [name] [name] is a 86 y.o.-year-old female, returning for f/u for DM2, dx 2005, insulin-dependent, uncontrolled, with complications (nephropathy). Last visit 4 months ago. She has [employer].  Reviewed HbA1c levels: Lab Results  Component Value Date   HGBA1C 7.3 (A) 12/12/2019   HGBA1C 7.4 (A) 08/15/2019   HGBA1C 7.3 (A) 04/15/2019   HGBA1C 7.2 11/15/2018   HGBA1C 6.6 (A) 07/16/2018   HGBA1C 6.6 (A) 03/15/2018   HGBA1C 7.2 (A) 12/12/2017   HGBA1C 8.4% 10/05/2017   HGBA1C 8.3 07/06/2017   HGBA1C 8.3 02/24/2017   Pt is on a regimen of: - Amaryl 4 mg 2x a day, before meals. - Basaglar 32 units at bedtime - Trulicity 1.5 >> 3 mg weekly - NovoLog   20-25 units before b'fast or lunch  26-28 units before dinner We had to stop Metformin 2/2 increased creatinine. Stopped Trulicity 40/9811 as she could not afford it anymore.  Pt checks her sugars twice a day: - am: 90-175, 190 >> 87, 99-150, 166 >> 115-130 - 2h after b'fast: 95, 114-200 >> 112-215 >> 99-173 - lunch:n/c >> 161, 203 >> 189-202 >> 150 >> 180, 223 - 2h after lunch: 120-150 >> n/c >> 135 >> n/c  - before dinner:  n/c >> 136 >> 180-216 >> n/c >> 179 - later:  171-290 >> 216-291 >> 183 >> 142-224 - bedtime:  110, 117, 179-299, 301 >> 189-248, 285  - night: 142, 149 Lowest sugar was 90 >> 87 >> 99. She has hypoglycemia awareness in the 70s. Highest 330 >> 301 >> 285  + CKD stage III; last BUN/Cr: Lab Results  Component Value Date   BUN 22 04/08/2020   CREATININE 1.81 (H) 04/08/2020   + HL; last set of  lipids: Lab Results  Component Value Date   CHOL 162 04/08/2020   HDL 60 04/08/2020   LDLCALC 65 04/08/2020   LDLDIRECT 112.0 12/27/2018   TRIG 284 (H) 04/08/2020   CHOLHDL 2.7 04/08/2020  On simvastatin 40. Pt's last eye exam was in 04/2019: No DR- Dr. Susa Loffler at [facility].  No numbness and tingling in her feet  ROS: Constitutional: no weight gain/no weight loss, no fatigue, no subjective hyperthermia, no subjective hypothermia Eyes: no blurry vision, no xerophthalmia ENT: no sore throat, no nodules palpated in neck, no dysphagia, no odynophagia, no hoarseness Cardiovascular: no CP/no SOB/no palpitations/no leg swelling Respiratory: no cough/no SOB/no wheezing Gastrointestinal: no N/no V/no D/no C/no acid reflux Musculoskeletal: no muscle aches/no joint aches Skin: no rashes, no hair loss Neurological: no tremors/no numbness/no tingling/no dizziness  I reviewed pt's medications, allergies, PMH, social hx, family hx, and changes were documented in the history of present illness. Otherwise, unchanged from my initial visit note.  Past Medical History:  Diagnosis Date  . Asthma   . DEGENERATIVE JOINT DISEASE, GENERALIZED 02/01/2007   Qualifier: Diagnosis of  By: Burnice Logan  MD, Doretha Sou   . DIARRHEA, CHRONIC 08/20/2009   Qualifier: Diagnosis of  By: Burnice Logan  MD, Doretha Sou   . DM 02/01/2007   Qualifier: Diagnosis of  By: Floyde Parkins    .  FATIGUE 10/20/2008   Qualifier: Diagnosis of  By: Burnice Logan  MD, Doretha Sou   . HYPERCHOLESTEROLEMIA 02/01/2007   Qualifier: Diagnosis of  By: Floyde Parkins    . HYPERLIPIDEMIA 12/20/2007   Qualifier: Diagnosis of  By: Burnice Logan  MD, Doretha Sou   . HYPERTENSION 02/01/2007   Qualifier: Diagnosis of  By: Floyde Parkins    . LOW BACK PAIN 01/13/2010   Qualifier: Diagnosis of  By: Burnice Logan  MD, Doretha Sou MUSCLE CRAMPS 04/06/2010   Qualifier: Diagnosis of  By: Burnice Logan  MD, Doretha Sou   . MYALGIA 01/20/2010   Qualifier:  Diagnosis of  By: Elease Hashimoto MD, [place]    . OSTEOARTHRITIS 01/13/2010   Qualifier: Diagnosis of  By: Burnice Logan  MD, Doretha Sou   . OVERACTIVE BLADDER 08/20/2009   Qualifier: Diagnosis of  By: Burnice Logan  MD, Doretha Sou   . PLEURISY 06/18/2008   Qualifier: Diagnosis of  By: Niel Hummer MD, [place] PNEUMONIA 06/18/2008   Qualifier: Diagnosis of  By: Niel Hummer MD, [place] Rectal prolapse 09/20/2007   Qualifier: Diagnosis of  By: Burnice Logan  MD, Doretha Sou   . VITAMIN B12 DEFICIENCY 07/20/2010   Qualifier: Diagnosis of  By: Carlean Purl MD, Dimas Millin    Past Surgical History:  Procedure Laterality Date  . ABDOMINAL HYSTERECTOMY     partial- states she has her ovaries  . APPENDECTOMY    . BLADDER SURGERY    . CATARACT EXTRACTION Bilateral 2012  . CHOLECYSTECTOMY     Social History   Socioeconomic History  . Marital status: Single    Spouse name: Not on file  . Number of children: Not on file  . Years of education: Not on file  . Highest education level: Not on file  Occupational History  . Not on file  Tobacco Use  . Smoking status: Former Smoker    Packs/day: 10.00  . Smokeless tobacco: Never Used  . Tobacco comment: quit > 73 yo   Substance and Sexual Activity  . Alcohol use: No  . Drug use: No  . Sexual activity: Not on file  Other Topics Concern  . Not on file  Social History Narrative   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    Married for 74 years, widowed for 22 years    Social Determinants of Radio broadcast assistant Strain:   . Difficulty of Paying Living Expenses: Not on file  Food Insecurity:   . Worried About Charity fundraiser in the Last Year: Not on file  . Ran Out of Food in the Last Year: Not on file  Transportation Needs:   . Lack of Transportation (Medical): Not on file  . Lack of Transportation (Non-Medical): Not on file  Physical Activity:   . Days of Exercise per Week: Not on file  . Minutes of Exercise per Session: Not on file  Stress:   . Feeling of  Stress : Not on file  Social Connections:   . Frequency of Communication with Friends and Family: Not on file  . Frequency of Social Gatherings with Friends and Family: Not on file  . Attends Religious Services: Not on file  . Active Member of Clubs or Organizations: Not on file  . Attends Archivist Meetings: Not on file  . Marital Status: Not on file  Intimate Partner Violence:   . Fear of Current or Ex-Partner: Not on file  . Emotionally Abused: Not  on file  . Physically Abused: Not on file  . Sexually Abused: Not on file   Current Outpatient Medications on File Prior to Visit  Medication Sig Dispense Refill  . Acetaminophen 650 MG TABS Take 1 tablet (650 mg total) by mouth 3 (three) times daily as needed. 30 tablet 0  . aspirin 81 MG tablet Take 81 mg by mouth daily.      . Blood Glucose Monitoring Suppl (ACCU-CHEK AVIVA PLUS) w/Device KIT Use to check blood sugar 3 times a day 1 kit 0  . calcium-vitamin D (OSCAL WITH D 500-200) 500-200 MG-UNIT per tablet Take 1 tablet by mouth daily.      . cyanocobalamin 1000 MCG tablet Take 100 mcg by mouth daily.      . diphenoxylate-atropine (LOMOTIL) 2.5-0.025 MG tablet Take 1 tablet by mouth 3 (three) times daily as needed for diarrhea or loose stools. 90 tablet 0  . Dulaglutide (TRULICITY) 3 NT/7.0YF SOPN Inject 3 mg into the skin once a week. 12 pen 3  . fluticasone (FLONASE) 50 MCG/ACT nasal spray Place 1 spray into the nose daily. 16 g 0  . glimepiride (AMARYL) 4 MG tablet Take 1 tablet (4 mg total) by mouth 2 (two) times daily. 180 tablet 3  . glucose blood (ACCU-CHEK AVIVA PLUS) test strip Use to check blood sugar 3 times a day. 300 strip 12  . hydrochlorothiazide (HYDRODIURIL) 25 MG tablet Take 1 tablet (25 mg total) by mouth daily. 90 tablet 3  . ibuprofen (ADVIL,MOTRIN) 600 MG tablet TAKE 1 TABLET BY MOUTH EVERY 6 HOURS AS NEEDED FOR PAIN 60 tablet 1  . Insulin Glargine (BASAGLAR KWIKPEN) 100 UNIT/ML INJECT 32 UNITS  SUBCUTANEOUSLY AT BEDTIME 30 mL 0  . Insulin Pen Needle (BD PEN NEEDLE NANO U/F) 32G X 4 MM MISC USE TO INJECT INSULIN 4 TIMES DAILY AS DIRECTED. 200 each 3  . Lancets (ONETOUCH ULTRASOFT) lancets Use as instructed to test 3 times daily 300 each 5  . Multiple Vitamin (MULTIVITAMIN) tablet Take 1 tablet by mouth daily.      Marland Kitchen NOVOLOG FLEXPEN 100 UNIT/ML FlexPen INJECT 20 TO  30 UNITS SUBCUTANEOUSLY THREE TIMES DAILY WITH MEALS 54 mL 3  . simvastatin (ZOCOR) 40 MG tablet Take 1 tablet by mouth once daily 90 tablet 0  . trazodone (DESYREL) 300 MG tablet TAKE 1 TABLET BY MOUTH AT BEDTIME 90 tablet 1  . venlafaxine XR (EFFEXOR-XR) 75 MG 24 hr capsule Take 1 capsule by mouth once daily 90 capsule 1   No current facility-administered medications on file prior to visit.   Allergies  Allergen Reactions  . Metformin     REACTION: diarrhea  . Sitagliptin Phosphate     REACTION: abdominal pain   Family History  Problem Relation Age of Onset  . Deep vein thrombosis Mother   . COPD Father   . Asthma Father   . Diabetes Neg Hx        family hx  . Cancer Neg Hx        breast ca - sister(S)?  Marland Kitchen Heart disease Neg Hx        family hx   PE: There were no vitals taken for this visit. There is no height or weight on file to calculate BMI. Wt Readings from Last 3 Encounters:  04/08/20 171 lb (77.6 kg)  12/12/19 170 lb (77.1 kg)  11/13/19 167 lb (75.8 kg)   Constitutional: overweight, in NAD Eyes: PERRLA, EOMI, no exophthalmos ENT: moist  mucous membranes, no thyromegaly, no cervical lymphadenopathy Cardiovascular: RRR, No MRG Respiratory: CTA B Gastrointestinal: abdomen soft, NT, ND, BS+ Musculoskeletal: no deformities, strength intact in all 4 Skin: moist, warm, no rashes Neurological: no tremor with outstretched hands, DTR normal in all 4  ASSESSMENT: 1. DM2, insulin-dependent, uncontrolled, without complications - + nephropathy  We have tried to stop Amaryl, but sugars were higher >> we  restarted it.  2. HL  3.  Overweight  PLAN:  1. Patient with longstanding, uncontrolled, type 2 diabetes, on sulfonylurea and basal-bolus insulin regimen and now also GLP-1 receptor agonist, with improved control after starting this.  In the past, she was having erratic meals and we discussed about trying to have a more scheduled regimen.  She was also eating the majority of her calories at night and not covering the well with insulin.  At last visit, she was checking sugars twice a day, rotating check times and they were higher in the middle of the day, before lunch and also high at bedtime.  She was still snacking between meals and having erratic meals.  We discussed about injecting NovoLog before a snack if she continues to have these.  However, we did not change the regimen otherwise.  HbA1c was 7.3%, slightly better.  - I advised her to:   Patient Instructions  Please continue: - Amaryl 4 mg 2x a day, before meals - Trulicity 3 mg weekly - Basaglar 32 units at bedtime - NovoLog   20-25 units before b'fast or lunch  26-28 units before dinner Please inject 5-8 units of insulin before a snack.  Please return in 4 months with your sugar log.  - we checked her HbA1c: 7%  - advised to check sugars at different times of the day - 3x a day, rotating check times - advised for yearly eye exams >> she is UTD - return to clinic in 4 months     2. HL -Reviewed latest lipid panel from earlier this month: LDL much improved, at goal, triglycerides high: Lab Results  Component Value Date   CHOL 162 04/08/2020   HDL 60 04/08/2020   LDLCALC 65 04/08/2020   LDLDIRECT 112.0 12/27/2018   TRIG 284 (H) 04/08/2020   CHOLHDL 2.7 04/08/2020  -Continues simvastatin 20 without side effects  3.  Overweight -We will continue Trulicity which should also help with weight loss -No significant weight loss before last visit  Philemon Kingdom, MD PhD [facility]

## 2020-04-15 ENCOUNTER — Telehealth: Payer: Self-pay | Admitting: Adult Health

## 2020-04-15 DIAGNOSIS — E1122 Type 2 diabetes mellitus with diabetic chronic kidney disease: Secondary | ICD-10-CM

## 2020-04-15 DIAGNOSIS — N184 Chronic kidney disease, stage 4 (severe): Secondary | ICD-10-CM

## 2020-04-15 NOTE — Telephone Encounter (Signed)
Spoke to the patient and informed her of her labs   Her kidney function has decreased with a Cr 1.81 and GFR 25.   She is ok with me sending her to Nephrology

## 2020-04-15 NOTE — Telephone Encounter (Signed)
Left VM to call back regarding labs. Have tried calling multiple times over the last week

## 2020-04-16 ENCOUNTER — Other Ambulatory Visit: Payer: Self-pay | Admitting: Internal Medicine

## 2020-04-23 DIAGNOSIS — E1121 Type 2 diabetes mellitus with diabetic nephropathy: Secondary | ICD-10-CM | POA: Diagnosis not present

## 2020-04-30 DIAGNOSIS — R300 Dysuria: Secondary | ICD-10-CM | POA: Diagnosis not present

## 2020-04-30 DIAGNOSIS — I129 Hypertensive chronic kidney disease with stage 1 through stage 4 chronic kidney disease, or unspecified chronic kidney disease: Secondary | ICD-10-CM | POA: Diagnosis not present

## 2020-04-30 DIAGNOSIS — N184 Chronic kidney disease, stage 4 (severe): Secondary | ICD-10-CM | POA: Diagnosis not present

## 2020-04-30 DIAGNOSIS — E1122 Type 2 diabetes mellitus with diabetic chronic kidney disease: Secondary | ICD-10-CM | POA: Diagnosis not present

## 2020-04-30 DIAGNOSIS — E875 Hyperkalemia: Secondary | ICD-10-CM | POA: Diagnosis not present

## 2020-05-05 ENCOUNTER — Other Ambulatory Visit: Payer: Self-pay | Admitting: *Deleted

## 2020-05-05 DIAGNOSIS — G4700 Insomnia, unspecified: Secondary | ICD-10-CM

## 2020-05-05 MED ORDER — TRAZODONE HCL 300 MG PO TABS: 300.0000 mg | ORAL_TABLET | Freq: Every day | ORAL | 1 refills | Status: DC

## 2020-05-06 ENCOUNTER — Encounter: Payer: Self-pay | Admitting: Internal Medicine

## 2020-05-07 ENCOUNTER — Other Ambulatory Visit: Payer: Self-pay | Admitting: *Deleted

## 2020-05-07 DIAGNOSIS — E7800 Pure hypercholesterolemia, unspecified: Secondary | ICD-10-CM

## 2020-05-07 MED ORDER — ACCU-CHEK SOFTCLIX LANCETS MISC: 12 refills | Status: DC

## 2020-05-07 MED ORDER — VENLAFAXINE HCL ER 75 MG PO CP24
75.0000 mg | ORAL_CAPSULE | Freq: Every day | ORAL | 1 refills | Status: DC
Start: 2020-05-07 — End: 2021-04-06

## 2020-05-07 MED ORDER — ACCU-CHEK GUIDE W/DEVICE KIT: PACK | 0 refills | Status: AC

## 2020-05-07 MED ORDER — SIMVASTATIN 40 MG PO TABS: 40.0000 mg | ORAL_TABLET | Freq: Every day | ORAL | 0 refills | Status: DC

## 2020-05-07 MED ORDER — ACCU-CHEK GUIDE VI STRP: ORAL_STRIP | 12 refills | Status: DC

## 2020-05-07 MED ORDER — ACCU-CHEK GUIDE CONTROL VI LIQD: 1 refills | Status: AC

## 2020-05-07 NOTE — Telephone Encounter (Signed)
Rx done. 

## 2020-05-07 NOTE — Addendum Note (Signed)
Addended by: Agnes Lawrence on: 05/07/2020 11:44 AM   Modules accepted: Orders

## 2020-06-08 ENCOUNTER — Other Ambulatory Visit: Payer: Self-pay | Admitting: Internal Medicine

## 2020-06-10 ENCOUNTER — Telehealth: Payer: Self-pay | Admitting: Internal Medicine

## 2020-06-10 DIAGNOSIS — Z794 Long term (current) use of insulin: Secondary | ICD-10-CM

## 2020-06-10 DIAGNOSIS — E1121 Type 2 diabetes mellitus with diabetic nephropathy: Secondary | ICD-10-CM

## 2020-06-10 MED ORDER — FREESTYLE LIBRE 14 DAY SENSOR MISC: 0 refills | Status: DC

## 2020-06-10 NOTE — Telephone Encounter (Signed)
Called and left a message advising patient sensors sent to preferred pharmacy.

## 2020-06-10 NOTE — Telephone Encounter (Signed)
Patient called to request a new RX for [employer] 14 day Sensors (Patient states she is now using the [employer] 14 Day Device) be sent to  [place] ([place] [place] [facility] [address] Phone:  [phone]  Fax:  [phone]

## 2020-07-15 DIAGNOSIS — E1121 Type 2 diabetes mellitus with diabetic nephropathy: Secondary | ICD-10-CM | POA: Diagnosis not present

## 2020-07-18 ENCOUNTER — Other Ambulatory Visit: Payer: Self-pay | Admitting: Adult Health

## 2020-07-18 DIAGNOSIS — E7800 Pure hypercholesterolemia, unspecified: Secondary | ICD-10-CM

## 2020-07-20 ENCOUNTER — Ambulatory Visit: Payer: Medicare HMO

## 2020-07-21 NOTE — Telephone Encounter (Signed)
SENT TO THE PHARMACY BY E-SCRIBE. 

## 2020-08-07 ENCOUNTER — Other Ambulatory Visit: Payer: Self-pay

## 2020-08-07 ENCOUNTER — Ambulatory Visit (INDEPENDENT_AMBULATORY_CARE_PROVIDER_SITE_OTHER): Payer: Medicare HMO

## 2020-08-07 VITALS — BP 130/72 | HR 100 | Temp 98.5°F | Ht 64.0 in | Wt 174.4 lb

## 2020-08-07 DIAGNOSIS — Z0000 Encounter for general adult medical examination without abnormal findings: Secondary | ICD-10-CM | POA: Diagnosis not present

## 2020-08-07 NOTE — Patient Instructions (Addendum)
[name] [name] , Thank you for taking time to come for your Medicare Wellness Visit. I appreciate your ongoing commitment to your health goals. Please review the following plan we discussed and let me know if I can assist you in the future.   Screening recommendations/referrals: Colonoscopy: No longer required  Mammogram: No longer required  Bone Density: No longer required  Recommended yearly ophthalmology/optometry visit for glaucoma screening and checkup Recommended yearly dental visit for hygiene and checkup  Vaccinations: Influenza vaccine: Up to date, next due fall 2022  Pneumococcal vaccine: Completed series  Tdap vaccine: Up to date, next due  04/06/2021 Shingles vaccine: Currently due for Shingrix, if you wish to receive we recommend that you do so at your local pharmacy as it is less expensive   Advanced directives: Advance directive discussed with you today. Even though you declined this today please call our office should you change your mind and we can give you the proper paperwork for you to fill out.   Conditions/risks identified: none   Next appointment: 08/09/2021 @ 3:30 PM with [place] 65 Years and Older, Female Preventive care refers to lifestyle choices and visits with your health care provider that can promote health and wellness. What does preventive care include?  A yearly physical exam. This is also called an annual well check.  Dental exams once or twice a year.  Routine eye exams. Ask your health care provider how often you should have your eyes checked.  Personal lifestyle choices, including:  Daily care of your teeth and gums.  Regular physical activity.  Eating a healthy diet.  Avoiding tobacco and drug use.  Limiting alcohol use.  Practicing safe sex.  Taking low-dose aspirin every day.  Taking vitamin and mineral supplements as recommended by your health care provider. What happens during an annual well  check? The services and screenings done by your health care provider during your annual well check will depend on your age, overall health, lifestyle risk factors, and family history of disease. Counseling  Your health care provider may ask you questions about your:  Alcohol use.  Tobacco use.  Drug use.  Emotional well-being.  Home and relationship well-being.  Sexual activity.  Eating habits.  History of falls.  Memory and ability to understand (cognition).  Work and work Statistician.  Reproductive health. Screening  You may have the following tests or measurements:  Height, weight, and BMI.  Blood pressure.  Lipid and cholesterol levels. These may be checked every 5 years, or more frequently if you are over 45 years old.  Skin check.  Lung cancer screening. You may have this screening every year starting at age 81 if you have a 30-pack-year history of smoking and currently smoke or have quit within the past 15 years.  Fecal occult blood test (FOBT) of the stool. You may have this test every year starting at age 93.  Flexible sigmoidoscopy or colonoscopy. You may have a sigmoidoscopy every 5 years or a colonoscopy every 10 years starting at age 43.  Hepatitis C blood test.  Hepatitis B blood test.  Sexually transmitted disease (STD) testing.  Diabetes screening. This is done by checking your blood sugar (glucose) after you have not eaten for a while (fasting). You may have this done every 1-3 years.  Bone density scan. This is done to screen for osteoporosis. You may have this done starting at age 48.  Mammogram. This may be done every 1-2 years.  Talk to your health care provider about how often you should have regular mammograms. Talk with your health care provider about your test results, treatment options, and if necessary, the need for more tests. Vaccines  Your health care provider may recommend certain vaccines, such as:  Influenza vaccine. This is  recommended every year.  Tetanus, diphtheria, and acellular pertussis (Tdap, Td) vaccine. You may need a Td booster every 10 years.  Zoster vaccine. You may need this after age 6.  Pneumococcal 13-valent conjugate (PCV13) vaccine. One dose is recommended after age 54.  Pneumococcal polysaccharide (PPSV23) vaccine. One dose is recommended after age 28. Talk to your health care provider about which screenings and vaccines you need and how often you need them. This information is not intended to replace advice given to you by your health care provider. Make sure you discuss any questions you have with your health care provider. Document Released: 07/17/2015 Document Revised: 03/09/2016 Document Reviewed: 04/21/2015 Elsevier Interactive Patient Education  2017 [place] Prevention in the Home Falls can cause injuries. They can happen to people of all ages. There are many things you can do to make your home safe and to help prevent falls. What can I do on the outside of my home?  Regularly fix the edges of walkways and driveways and fix any cracks.  Remove anything that might make you trip as you walk through a door, such as a raised step or threshold.  Trim any bushes or trees on the path to your home.  Use bright outdoor lighting.  Clear any walking paths of anything that might make someone trip, such as rocks or tools.  Regularly check to see if handrails are loose or broken. Make sure that both sides of any steps have handrails.  Any raised decks and porches should have guardrails on the edges.  Have any leaves, snow, or ice cleared regularly.  Use sand or salt on walking paths during winter.  Clean up any spills in your garage right away. This includes oil or grease spills. What can I do in the bathroom?  Use night lights.  Install grab bars by the toilet and in the tub and shower. Do not use towel bars as grab bars.  Use non-skid mats or decals in the tub or  shower.  If you need to sit down in the shower, use a plastic, non-slip stool.  Keep the floor dry. Clean up any water that spills on the floor as soon as it happens.  Remove soap buildup in the tub or shower regularly.  Attach bath mats securely with double-sided non-slip rug tape.  Do not have throw rugs and other things on the floor that can make you trip. What can I do in the bedroom?  Use night lights.  Make sure that you have a light by your bed that is easy to reach.  Do not use any sheets or blankets that are too big for your bed. They should not hang down onto the floor.  Have a firm chair that has side arms. You can use this for support while you get dressed.  Do not have throw rugs and other things on the floor that can make you trip. What can I do in the kitchen?  Clean up any spills right away.  Avoid walking on wet floors.  Keep items that you use a lot in easy-to-reach places.  If you need to reach something above you, use a strong step stool  that has a grab bar.  Keep electrical cords out of the way.  Do not use floor polish or wax that makes floors slippery. If you must use wax, use non-skid floor wax.  Do not have throw rugs and other things on the floor that can make you trip. What can I do with my stairs?  Do not leave any items on the stairs.  Make sure that there are handrails on both sides of the stairs and use them. Fix handrails that are broken or loose. Make sure that handrails are as long as the stairways.  Check any carpeting to make sure that it is firmly attached to the stairs. Fix any carpet that is loose or worn.  Avoid having throw rugs at the top or bottom of the stairs. If you do have throw rugs, attach them to the floor with carpet tape.  Make sure that you have a light switch at the top of the stairs and the bottom of the stairs. If you do not have them, ask someone to add them for you. What else can I do to help prevent  falls?  Wear shoes that:  Do not have high heels.  Have rubber bottoms.  Are comfortable and fit you well.  Are closed at the toe. Do not wear sandals.  If you use a stepladder:  Make sure that it is fully opened. Do not climb a closed stepladder.  Make sure that both sides of the stepladder are locked into place.  Ask someone to hold it for you, if possible.  Clearly mark and make sure that you can see:  Any grab bars or handrails.  First and last steps.  Where the edge of each step is.  Use tools that help you move around (mobility aids) if they are needed. These include:  Canes.  Walkers.  Scooters.  Crutches.  Turn on the lights when you go into a dark area. Replace any light bulbs as soon as they burn out.  Set up your furniture so you have a clear path. Avoid moving your furniture around.  If any of your floors are uneven, fix them.  If there are any pets around you, be aware of where they are.  Review your medicines with your doctor. Some medicines can make you feel dizzy. This can increase your chance of falling. Ask your doctor what other things that you can do to help prevent falls. This information is not intended to replace advice given to you by your health care provider. Make sure you discuss any questions you have with your health care provider. Document Released: 04/16/2009 Document Revised: 11/26/2015 Document Reviewed: 07/25/2014 Elsevier Interactive Patient Education  2017 [employer].

## 2020-08-07 NOTE — Progress Notes (Signed)
Subjective:   [name] [name] is a 87 y.o. female who presents for Medicare Annual (Subsequent) preventive examination.  Review of Systems    N/A  Cardiac Risk Factors include: advanced age (>76mn, >>80women);dyslipidemia;diabetes mellitus;hypertension     Objective:    Today's Vitals   08/07/20 1531  BP: 130/72  Pulse: 100  Temp: 98.5 F (36.9 C)  TempSrc: Oral  SpO2: 95%  Weight: 174 lb 7 oz (79.1 kg)  Height: 5' 4"  (1.626 m)   Body mass index is 29.94 kg/m.  Advanced Directives 08/07/2020 05/05/2016  Does Patient Have a Medical Advance Directive? No No  Would patient like information on creating a medical advance directive? No - Patient declined Yes - Educational materials given    Current Medications (verified) Outpatient Encounter Medications as of 08/07/2020  Medication Sig  . Acetaminophen 650 MG TABS Take 1 tablet (650 mg total) by mouth 3 (three) times daily as needed.  .Marland Kitchenaspirin 81 MG tablet Take 81 mg by mouth daily.  . calcium-vitamin D (OSCAL WITH D 500-200) 500-200 MG-UNIT per tablet Take 1 tablet by mouth daily.  . Continuous Blood Gluc Sensor (FREESTYLE LIBRE 14 DAY SENSOR) MISC Use to check blood glucose 3 times a day  . cyanocobalamin 1000 MCG tablet Take 100 mcg by mouth daily.  . Dulaglutide (TRULICITY) 3 MAQ/7.6AUSOPN Inject 3 mg into the skin once a week.  . fluticasone (FLONASE) 50 MCG/ACT nasal spray Place 1 spray into the nose daily.  .Marland Kitchenglimepiride (AMARYL) 4 MG tablet Take 1 tablet (4 mg total) by mouth 2 (two) times daily.  . hydrochlorothiazide (HYDRODIURIL) 25 MG tablet Take 1 tablet (25 mg total) by mouth daily.  .Marland Kitchenibuprofen (ADVIL,MOTRIN) 600 MG tablet TAKE 1 TABLET BY MOUTH EVERY 6 HOURS AS NEEDED FOR PAIN  . Insulin Glargine (BASAGLAR KWIKPEN) 100 UNIT/ML INJECT 32 UNITS SUBCUTANEOUSLY AT BEDTIME  . Insulin Pen Needle (BD PEN NEEDLE NANO U/F) 32G X 4 MM MISC USE TO INJECT INSULIN 4 TIMES DAILY AS DIRECTED.  . Lancets (ONETOUCH ULTRASOFT)  lancets Use as instructed to test 3 times daily  . Multiple Vitamin (MULTIVITAMIN) tablet Take 1 tablet by mouth daily.  .Marland KitchenNOVOLOG FLEXPEN 100 UNIT/ML FlexPen INJECT 20 TO  30 UNITS SUBCUTANEOUSLY THREE TIMES DAILY WITH MEALS  . simvastatin (ZOCOR) 40 MG tablet TAKE 1 TABLET EVERY DAY  . trazodone (DESYREL) 300 MG tablet Take 1 tablet (300 mg total) by mouth at bedtime.  .Marland Kitchenvenlafaxine XR (EFFEXOR-XR) 75 MG 24 hr capsule Take 1 capsule (75 mg total) by mouth daily.  . Accu-Chek Softclix Lancets lancets Use as instructed (Patient not taking: Reported on 08/07/2020)  . Blood Glucose Calibration (ACCU-CHEK GUIDE CONTROL) LIQD Use as directed (Patient not taking: Reported on 08/07/2020)  . Blood Glucose Monitoring Suppl (ACCU-CHEK AVIVA PLUS) w/Device KIT Use to check blood sugar 3 times a day (Patient not taking: Reported on 08/07/2020)  . Blood Glucose Monitoring Suppl (ACCU-CHEK GUIDE) w/Device KIT Use as directed (Patient not taking: Reported on 08/07/2020)  . diphenoxylate-atropine (LOMOTIL) 2.5-0.025 MG tablet Take 1 tablet by mouth 3 (three) times daily as needed for diarrhea or loose stools. (Patient not taking: Reported on 08/07/2020)  . glucose blood (ACCU-CHEK AVIVA PLUS) test strip Use to check blood sugar 3 times a day. (Patient not taking: Reported on 08/07/2020)  . glucose blood (ACCU-CHEK GUIDE) test strip Use as instructed (Patient not taking: Reported on 08/07/2020)   No facility-administered encounter medications on file as  of 08/07/2020.    Allergies (verified) Metformin and Sitagliptin phosphate   History: Past Medical History:  Diagnosis Date  . Asthma   . DEGENERATIVE JOINT DISEASE, GENERALIZED 02/01/2007   Qualifier: Diagnosis of  By: Burnice Logan  MD, Doretha Sou   . DIARRHEA, CHRONIC 08/20/2009   Qualifier: Diagnosis of  By: Burnice Logan  MD, Doretha Sou   . DM 02/01/2007   Qualifier: Diagnosis of  By: Floyde Parkins    . FATIGUE 10/20/2008   Qualifier: Diagnosis of  By: Burnice Logan  MD, Doretha Sou    . HYPERCHOLESTEROLEMIA 02/01/2007   Qualifier: Diagnosis of  By: Floyde Parkins    . HYPERLIPIDEMIA 12/20/2007   Qualifier: Diagnosis of  By: Burnice Logan  MD, Doretha Sou   . HYPERTENSION 02/01/2007   Qualifier: Diagnosis of  By: Floyde Parkins    . LOW BACK PAIN 01/13/2010   Qualifier: Diagnosis of  By: Burnice Logan  MD, Doretha Sou MUSCLE CRAMPS 04/06/2010   Qualifier: Diagnosis of  By: Burnice Logan  MD, Doretha Sou   . MYALGIA 01/20/2010   Qualifier: Diagnosis of  By: Elease Hashimoto MD, [place]    . OSTEOARTHRITIS 01/13/2010   Qualifier: Diagnosis of  By: Burnice Logan  MD, Doretha Sou   . OVERACTIVE BLADDER 08/20/2009   Qualifier: Diagnosis of  By: Burnice Logan  MD, Doretha Sou   . PLEURISY 06/18/2008   Qualifier: Diagnosis of  By: Niel Hummer MD, [place] PNEUMONIA 06/18/2008   Qualifier: Diagnosis of  By: Niel Hummer MD, [place] Rectal prolapse 09/20/2007   Qualifier: Diagnosis of  By: Burnice Logan  MD, Doretha Sou   . VITAMIN B12 DEFICIENCY 07/20/2010   Qualifier: Diagnosis of  By: Carlean Purl MD, Dimas Millin    Past Surgical History:  Procedure Laterality Date  . ABDOMINAL HYSTERECTOMY     partial- states she has her ovaries  . APPENDECTOMY    . BLADDER SURGERY    . CATARACT EXTRACTION Bilateral 2012  . CHOLECYSTECTOMY     Family History  Problem Relation Age of Onset  . Deep vein thrombosis Mother   . COPD Father   . Asthma Father   . Diabetes Neg Hx        family hx  . Cancer Neg Hx        breast ca - sister(S)?  Marland Kitchen Heart disease Neg Hx        family hx   Social History   Socioeconomic History  . Marital status: Single    Spouse name: Not on file  . Number of children: Not on file  . Years of education: Not on file  . Highest education level: Not on file  Occupational History  . Not on file  Tobacco Use  . Smoking status: Former Smoker    Packs/day: 10.00  . Smokeless tobacco: Never Used  . Tobacco comment: quit > 12 yo   Substance and Sexual Activity  . Alcohol use: No  . Drug use: No   . Sexual activity: Not on file  Other Topics Concern  . 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 Difficulty of Paying Living Expenses: Not hard at all  Food Insecurity: No Food Insecurity  . Worried About Charity fundraiser in the Last Year: Never true  .  Ran Out of Food in the Last Year: Never true  Transportation Needs: No Transportation Needs  . Lack of Transportation (Medical): No  . Lack of Transportation (Non-Medical): No  Physical Activity: Inactive  . Days of Exercise per Week: 0 days  . Minutes of Exercise per Session: 0 min  Stress: No Stress Concern Present  . Feeling of Stress : Not at all  Social Connections: Moderately Integrated  . Frequency of Communication with Friends and Family: More than three times a week  . Frequency of Social Gatherings with Friends and Family: More than three times a week  . Attends Religious Services: More than 4 times per year  . Active Member of Clubs or Organizations: Yes  . Attends Archivist Meetings: More than 4 times per year  . Marital Status: Widowed    Tobacco Counseling Counseling given: Not Answered Comment: quit > 12 yo    Clinical Intake:  Pre-visit preparation completed: Yes  Pain : No/denies pain     Nutritional Risks: None Diabetes: Yes CBG done?: No Did pt. bring in CBG monitor from home?: No  How often do you need to have someone help you when you read instructions, pamphlets, or other written materials from your doctor or pharmacy?: 1 - Never What is the last grade level you completed in school?: 8th grade  Diabetic?yes Nutrition Risk Assessment:  Has the patient had any N/V/D within the last 2 months?  No  Does the patient have any non-healing wounds?  No  Has the patient had any unintentional weight loss or weight gain?  No   Diabetes:  Is the  patient diabetic?  Yes  If diabetic, was a CBG obtained today?  No  Did the patient bring in their glucometer from home?  No  How often do you monitor your CBG's? Patient checks glucose several times daily.   Financial Strains and Diabetes Management:  Are you having any financial strains with the device, your supplies or your medication? No .  Does the patient want to be seen by Chronic Care Management for management of their diabetes?  No  Would the patient like to be referred to a Nutritionist or for Diabetic Management?  No   Diabetic Exams:  Diabetic Eye Exam: Completed 04/10/2020 Diabetic Foot Exam: Overdue, Pt has been advised about the importance in completing this exam. Pt is scheduled for diabetic foot exam on next scheduled appointment with endocrinology.   Interpreter Needed?: No  Information entered by :: [place] of Daily Living In your present state of health, do you have any difficulty performing the following activities: 08/07/2020 04/08/2020  Hearing? N -  Vision? N N  Difficulty concentrating or making decisions? Y N  Comment occassional issues with short term memory -  Walking or climbing stairs? N N  Dressing or bathing? N N  Doing errands, shopping? N -  Preparing Food and eating ? N -  Using the Toilet? N -  In the past six months, have you accidently leaked urine? N -  Do you have problems with loss of bowel control? N -  Managing your Medications? N -  Managing your Finances? N -  Housekeeping or managing your Housekeeping? N -  Some recent data might be hidden    Patient Care Team: Dorothyann Peng, NP as PCP - General (Family Medicine) [place], Anne Ng, [facility] as Pharmacist (Pharmacist)  Indicate any recent Medical Services you may have received from other than [facility] providers in  the past year (date may be approximate).     Assessment:   This is a routine wellness examination for [place].  Hearing/Vision screen  Hearing Screening    125Hz  250Hz  500Hz  1000Hz  2000Hz  3000Hz  4000Hz  6000Hz  8000Hz   Right ear:           Left ear:           Vision Screening Comments: Patient states gets eyes examined once per year    Dietary issues and exercise activities discussed: Current Exercise Habits: The patient does not participate in regular exercise at present  Goals    . Chronic Care Management    . Exercise 3x per week (30 min per time)    . patient     Maintain my health; keep engaged socially       Depression Screen PHQ 2/9 Scores 08/07/2020 04/08/2020 12/27/2018 05/08/2018 05/05/2016 06/08/2015  PHQ - 2 Score 0 0 0 0 0 0    Fall Risk Fall Risk  08/07/2020 04/08/2020 12/27/2018 05/08/2018 05/05/2016  Falls in the past year? 1 - 0 0 No  Number falls in past yr: 0 0 - - -  Injury with Fall? 1 0 - - -  Risk for fall due to : History of fall(s);Impaired balance/gait - - - -  Follow up Falls evaluation completed;Falls prevention discussed - - - -    FALL RISK PREVENTION PERTAINING TO THE HOME:  Any stairs in or around the home? No  If so, are there any without handrails? Yes  Home free of loose throw rugs in walkways, pet beds, electrical cords, etc? No  Adequate lighting in your home to reduce risk of falls? No   ASSISTIVE DEVICES UTILIZED TO PREVENT FALLS:  Life alert? No  Use of a cane, walker or w/c? No  Grab bars in the bathroom? Yes  Shower chair or bench in shower? Yes  Elevated toilet seat or a handicapped toilet? Yes   TIMED UP AND GO:  Was the test performed? Yes .  Length of time to ambulate 10 feet: 5 sec.   Gait slow and steady without use of assistive device  Cognitive Function: MMSE - Mini Mental State Exam 05/05/2016  Not completed: (No Data)     6CIT Screen 08/07/2020  What Year? 0 points  What month? 0 points  What time? 0 points  Count back from 20 0 points  Months in reverse 0 points  Repeat phrase 6 points  Total Score 6    Immunizations Immunization History  Administered Date(s)  Administered  . Fluad Quad(high Dose 65+) 04/08/2020  . Influenza Whole 04/03/2006, 05/09/2007, 03/20/2008, 04/20/2009, 03/12/2010  . Influenza, High Dose Seasonal PF 03/04/2016, 03/31/2017, 03/15/2018  . Influenza, Quadrivalent, Recombinant, Inj, Pf 04/02/2019  . Influenza,inj,Quad PF,6+ Mos 03/20/2014  . Influenza-Unspecified 03/11/2015  . PFIZER(Purple Top)SARS-COV-2 Vaccination 08/31/2019, 09/28/2019, 05/18/2020  . Pneumococcal Conjugate-13 03/24/2014, 12/05/2014  . Pneumococcal Polysaccharide-23 07/04/2001  . Td 07/04/2001  . Tdap 04/07/2011  . Zoster 10/28/2010    TDAP status: Up to date  Flu Vaccine status: Up to date  Pneumococcal vaccine status: Up to date  Covid-19 vaccine status: Completed vaccines  Qualifies for Shingles Vaccine? Yes   Zostavax completed Yes   Shingrix Completed?: No.    Education has been provided regarding the importance of this vaccine. Patient has been advised to call insurance company to determine out of pocket expense if they have not yet received this vaccine. Advised may also receive vaccine at local pharmacy or  Health Dept. Verbalized acceptance and understanding.  Screening Tests Health Maintenance  Topic Date Due  . URINE MICROALBUMIN  06/01/2018  . FOOT EXAM  12/27/2019  . HEMOGLOBIN A1C  06/12/2020  . TETANUS/TDAP  04/06/2021  . OPHTHALMOLOGY EXAM  04/10/2021  . INFLUENZA VACCINE  Completed  . DEXA SCAN  Completed  . COVID-19 Vaccine  Completed  . PNA vac Low Risk Adult  Completed    Health Maintenance  Health Maintenance Due  Topic Date Due  . URINE MICROALBUMIN  06/01/2018  . FOOT EXAM  12/27/2019  . HEMOGLOBIN A1C  06/12/2020    Colorectal cancer screening: No longer required.   Mammogram status: No longer required due to age.  Bone Density status: Completed 05/16/2016. Results reflect: Bone density results: NORMAL. Repeat every 0 years.  Lung Cancer Screening: (Low Dose CT Chest recommended if Age 28-80 years, 30  pack-year currently smoking OR have quit w/in 15years.) does not qualify.   Lung Cancer Screening Referral: N/A   Additional Screening:  Hepatitis C Screening: does not qualify;   Vision Screening: Recommended annual ophthalmology exams for early detection of glaucoma and other disorders of the eye. Is the patient up to date with their annual eye exam?  Yes  Who is the provider or what is the name of the office in which the patient attends annual eye exams? Dr.McCuen If pt is not established with a provider, would they like to be referred to a provider to establish care? No .   Dental Screening: Recommended annual dental exams for proper oral hygiene  Community Resource Referral / Chronic Care Management: CRR required this visit?  No   CCM required this visit?  No      Plan:     I have personally reviewed and noted the following in the patient's chart:   . Medical and social history . Use of alcohol, tobacco or illicit drugs  . Current medications and supplements . Functional ability and status . Nutritional status . Physical activity . Advanced directives . List of other physicians . Hospitalizations, surgeries, and ER visits in previous 12 months . Vitals . Screenings to include cognitive, depression, and falls . Referrals and appointments  In addition, I have reviewed and discussed with patient certain preventive protocols, quality metrics, and best practice recommendations. A written personalized care plan for preventive services as well as general preventive health recommendations were provided to patient.     Ofilia Neas, LPN   02/04/4170   Nurse Notes: None

## 2020-08-21 ENCOUNTER — Ambulatory Visit (INDEPENDENT_AMBULATORY_CARE_PROVIDER_SITE_OTHER): Payer: Medicare HMO | Admitting: Adult Health

## 2020-08-21 ENCOUNTER — Encounter: Payer: Self-pay | Admitting: Adult Health

## 2020-08-21 ENCOUNTER — Other Ambulatory Visit: Payer: Self-pay

## 2020-08-21 VITALS — BP 150/82 | Temp 98.2°F | Wt 171.0 lb

## 2020-08-21 DIAGNOSIS — R2681 Unsteadiness on feet: Secondary | ICD-10-CM | POA: Diagnosis not present

## 2020-08-21 DIAGNOSIS — R4189 Other symptoms and signs involving cognitive functions and awareness: Secondary | ICD-10-CM

## 2020-08-21 LAB — COMPREHENSIVE METABOLIC PANEL
ALT: 17 U/L (ref 0–35)
AST: 28 U/L (ref 0–37)
Albumin: 4 g/dL (ref 3.5–5.2)
Alkaline Phosphatase: 45 U/L (ref 39–117)
BUN: 23 mg/dL (ref 6–23)
CO2: 28 mEq/L (ref 19–32)
Calcium: 9.3 mg/dL (ref 8.4–10.5)
Chloride: 98 mEq/L (ref 96–112)
Creatinine, Ser: 1.61 mg/dL — ABNORMAL HIGH (ref 0.40–1.20)
GFR: 28.27 mL/min — ABNORMAL LOW (ref >60.00)
Glucose, Bld: 277 mg/dL — ABNORMAL HIGH (ref 70–99)
Potassium: 4.9 mEq/L (ref 3.5–5.1)
Sodium: 137 mEq/L (ref 135–145)
Total Bilirubin: 0.4 mg/dL (ref 0.2–1.2)
Total Protein: 7.4 g/dL (ref 6.0–8.3)

## 2020-08-21 LAB — CBC WITH DIFFERENTIAL/PLATELET
Basophils Absolute: 0 10*3/uL (ref 0.0–0.1)
Basophils Relative: 0.5 % (ref 0.0–3.0)
Eosinophils Absolute: 0.3 10*3/uL (ref 0.0–0.7)
Eosinophils Relative: 4.6 % (ref 0.0–5.0)
HCT: 39.7 % (ref 36.0–46.0)
Hemoglobin: 13.2 g/dL (ref 12.0–15.0)
Lymphocytes Relative: 35 % (ref 12.0–46.0)
Lymphs Abs: 2.5 10*3/uL (ref 0.7–4.0)
MCHC: 33.3 g/dL (ref 30.0–36.0)
MCV: 94.2 fl (ref 78.0–100.0)
Monocytes Absolute: 0.4 10*3/uL (ref 0.1–1.0)
Monocytes Relative: 6.2 % (ref 3.0–12.0)
Neutro Abs: 3.9 10*3/uL (ref 1.4–7.7)
Neutrophils Relative %: 53.7 % (ref 43.0–77.0)
Platelets: 237 10*3/uL (ref 150.0–400.0)
RBC: 4.22 Mil/uL (ref 3.87–5.11)
RDW: 13.2 % (ref 11.5–15.5)
WBC: 7.3 10*3/uL (ref 4.0–10.5)

## 2020-08-21 LAB — URINALYSIS, ROUTINE W REFLEX MICROSCOPIC
Bilirubin Urine: NEGATIVE
Ketones, ur: NEGATIVE
Leukocytes,Ua: NEGATIVE
Nitrite: NEGATIVE
Specific Gravity, Urine: >=1.03 — AB (ref 1.000–1.030)
Total Protein, Urine: 100 — AB
Urine Glucose: 250 — AB
Urobilinogen, UA: 0.2 (ref 0.0–1.0)
pH: 5.5 (ref 5.0–8.0)

## 2020-08-21 LAB — TSH: TSH: 1.28 u[IU]/mL (ref 0.35–4.50)

## 2020-08-21 LAB — VITAMIN B12: Vitamin B-12: 1124 pg/mL — ABNORMAL HIGH (ref 211–911)

## 2020-08-21 NOTE — Progress Notes (Signed)
Subjective:    Patient ID: [name] [name], female    DOB: [birth year], 87 y.o.   MRN: [id number]  HPI 87 year old female who  has a past medical history of Asthma, DEGENERATIVE JOINT DISEASE, GENERALIZED (02/01/2007), DIARRHEA, CHRONIC (08/20/2009), DM (02/01/2007), FATIGUE (10/20/2008), HYPERCHOLESTEROLEMIA (02/01/2007), HYPERLIPIDEMIA (12/20/2007), HYPERTENSION (02/01/2007), LOW BACK PAIN (01/13/2010), MUSCLE CRAMPS (04/06/2010), MYALGIA (01/20/2010), OSTEOARTHRITIS (01/13/2010), OVERACTIVE BLADDER (08/20/2009), PLEURISY (06/18/2008), PNEUMONIA (06/18/2008), Rectal prolapse (09/20/2007), and VITAMIN B12 DEFICIENCY (07/20/2010).  She presents to the office today with her son and daughter for concern of falls and memory impairment   Her family reports that the patient has fallen twice in the last month and was not seen after the falls.   The first fell happens in December? When she backwards after slipping on wet leaves, hitting her head on asphalt drive way.    The second time she fell was about two weeks ago when getting out of bed and stumbled and hit her face on the floor.   Her family is concerned they feel as though her cognitive function has declined since the 2 falls.  They report that she will often forget that she is making food on the stove and walk away.  They also report that she cannot remember calling family members.  The patient herself does not feel as though her memory has gotten worse.  When asked if she has gotten lost recently while driving she states "I cannot remember".  Family has never been notified that she has been lost.   Review of Systems See HPI   Past Medical History:  Diagnosis Date  . Asthma   . DEGENERATIVE JOINT DISEASE, GENERALIZED 02/01/2007   Qualifier: Diagnosis of  By: Burnice Logan  MD, Doretha Sou   . DIARRHEA, CHRONIC 08/20/2009   Qualifier: Diagnosis of  By: Burnice Logan  MD, Doretha Sou   . DM 02/01/2007   Qualifier: Diagnosis of  By: Floyde Parkins    . FATIGUE  10/20/2008   Qualifier: Diagnosis of  By: Burnice Logan  MD, Doretha Sou   . HYPERCHOLESTEROLEMIA 02/01/2007   Qualifier: Diagnosis of  By: Floyde Parkins    . HYPERLIPIDEMIA 12/20/2007   Qualifier: Diagnosis of  By: Burnice Logan  MD, Doretha Sou   . HYPERTENSION 02/01/2007   Qualifier: Diagnosis of  By: Floyde Parkins    . LOW BACK PAIN 01/13/2010   Qualifier: Diagnosis of  By: Burnice Logan  MD, Doretha Sou MUSCLE CRAMPS 04/06/2010   Qualifier: Diagnosis of  By: Burnice Logan  MD, Doretha Sou   . MYALGIA 01/20/2010   Qualifier: Diagnosis of  By: Elease Hashimoto MD, Bruce    . OSTEOARTHRITIS 01/13/2010   Qualifier: Diagnosis of  By: Burnice Logan  MD, Doretha Sou   . OVERACTIVE BLADDER 08/20/2009   Qualifier: Diagnosis of  By: Burnice Logan  MD, Doretha Sou   . PLEURISY 06/18/2008   Qualifier: Diagnosis of  By: Niel Hummer MD, [place] PNEUMONIA 06/18/2008   Qualifier: Diagnosis of  By: Niel Hummer MD, [place] Rectal prolapse 09/20/2007   Qualifier: Diagnosis of  By: Burnice Logan  MD, Doretha Sou   . VITAMIN B12 DEFICIENCY 07/20/2010   Qualifier: Diagnosis of  By: Carlean Purl MD, Dimas Millin     Social History   Socioeconomic History  . Marital status: Single    Spouse name: Not on file  . Number of children: Not on file  . Years of education: Not on file  . Highest education  level: Not on file  Occupational History  . Not on file  Tobacco Use  . Smoking status: Former Smoker    Packs/day: 10.00  . Smokeless tobacco: Never Used  . Tobacco comment: quit > 62 yo   Substance and Sexual Activity  . Alcohol use: No  . Drug use: No  . Sexual activity: Not on file  Other Topics Concern  . Not on file  Social History Narrative   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    Married for 63 years, widowed for 22 years    Social Determinants of Health   Financial Resource Strain: Low Risk   . Difficulty of Paying Living Expenses: Not hard at all  Food Insecurity: No Food Insecurity  . Worried About Charity fundraiser in the Last Year: Never true   . Ran Out of Food in the Last Year: Never true  Transportation Needs: No Transportation Needs  . Lack of Transportation (Medical): No  . Lack of Transportation (Non-Medical): No  Physical Activity: Inactive  . Days of Exercise per Week: 0 days  . Minutes of Exercise per Session: 0 min  Stress: No Stress Concern Present  . Feeling of Stress : Not at all  Social Connections: Moderately Integrated  . Frequency of Communication with Friends and Family: More than three times a week  . Frequency of Social Gatherings with Friends and Family: More than three times a week  . Attends Religious Services: More than 4 times per year  . Active Member of Clubs or Organizations: Yes  . Attends Archivist Meetings: More than 4 times per year  . Marital Status: Widowed  Intimate Partner Violence: Not At Risk  . Fear of Current or Ex-Partner: No  . Emotionally Abused: No  . Physically Abused: No  . Sexually Abused: No    Past Surgical History:  Procedure Laterality Date  . ABDOMINAL HYSTERECTOMY     partial- states she has her ovaries  . APPENDECTOMY    . BLADDER SURGERY    . CATARACT EXTRACTION Bilateral 2012  . CHOLECYSTECTOMY      Family History  Problem Relation Age of Onset  . Deep vein thrombosis Mother   . COPD Father   . Asthma Father   . Diabetes Neg Hx        family hx  . Cancer Neg Hx        breast ca - sister(S)?  Marland Kitchen Heart disease Neg Hx        family hx    Allergies  Allergen Reactions  . Metformin     REACTION: diarrhea  . Sitagliptin Phosphate     REACTION: abdominal pain    Current Outpatient Medications on File Prior to Visit  Medication Sig Dispense Refill  . Accu-Chek Softclix Lancets lancets Use as instructed 100 each 12  . Acetaminophen 650 MG TABS Take 1 tablet (650 mg total) by mouth 3 (three) times daily as needed. 30 tablet 0  . aspirin 81 MG tablet Take 81 mg by mouth daily.    . Blood Glucose Calibration (ACCU-CHEK GUIDE CONTROL) LIQD Use  as directed 1 each 1  . Blood Glucose Monitoring Suppl (ACCU-CHEK AVIVA PLUS) w/Device KIT Use to check blood sugar 3 times a day 1 kit 0  . Blood Glucose Monitoring Suppl (ACCU-CHEK GUIDE) w/Device KIT Use as directed 1 kit 0  . calcium-vitamin D (OSCAL WITH D 500-200) 500-200 MG-UNIT per tablet Take 1 tablet by mouth daily.    Marland Kitchen  Continuous Blood Gluc Sensor (FREESTYLE LIBRE 14 DAY SENSOR) MISC Use to check blood glucose 3 times a day 6 each 0  . cyanocobalamin 1000 MCG tablet Take 100 mcg by mouth daily.    . diphenoxylate-atropine (LOMOTIL) 2.5-0.025 MG tablet Take 1 tablet by mouth 3 (three) times daily as needed for diarrhea or loose stools. 90 tablet 0  . Dulaglutide (TRULICITY) 3 MM/7.6KG SOPN Inject 3 mg into the skin once a week. 12 pen 3  . fluticasone (FLONASE) 50 MCG/ACT nasal spray Place 1 spray into the nose daily. 16 g 0  . glimepiride (AMARYL) 4 MG tablet Take 1 tablet (4 mg total) by mouth 2 (two) times daily. 180 tablet 3  . glucose blood (ACCU-CHEK AVIVA PLUS) test strip Use to check blood sugar 3 times a day. 300 strip 12  . glucose blood (ACCU-CHEK GUIDE) test strip Use as instructed 100 each 12  . hydrochlorothiazide (HYDRODIURIL) 25 MG tablet Take 1 tablet (25 mg total) by mouth daily. 90 tablet 3  . ibuprofen (ADVIL,MOTRIN) 600 MG tablet TAKE 1 TABLET BY MOUTH EVERY 6 HOURS AS NEEDED FOR PAIN 60 tablet 1  . Insulin Glargine (BASAGLAR KWIKPEN) 100 UNIT/ML INJECT 32 UNITS SUBCUTANEOUSLY AT BEDTIME 30 mL 0  . Insulin Pen Needle (BD PEN NEEDLE NANO U/F) 32G X 4 MM MISC USE TO INJECT INSULIN 4 TIMES DAILY AS DIRECTED. 200 each 3  . Lancets (ONETOUCH ULTRASOFT) lancets Use as instructed to test 3 times daily 300 each 5  . Multiple Vitamin (MULTIVITAMIN) tablet Take 1 tablet by mouth daily.    Marland Kitchen NOVOLOG FLEXPEN 100 UNIT/ML FlexPen INJECT 20 TO  30 UNITS SUBCUTANEOUSLY THREE TIMES DAILY WITH MEALS 90 mL 1  . simvastatin (ZOCOR) 40 MG tablet TAKE 1 TABLET EVERY DAY 90 tablet 2  .  trazodone (DESYREL) 300 MG tablet Take 1 tablet (300 mg total) by mouth at bedtime. 90 tablet 1  . venlafaxine XR (EFFEXOR-XR) 75 MG 24 hr capsule Take 1 capsule (75 mg total) by mouth daily. 90 capsule 1   No current facility-administered medications on file prior to visit.    BP (!) 150/82   Temp 98.2 F (36.8 C)   Wt 171 lb (77.6 kg)   BMI 29.35 kg/m       Objective:   Physical Exam Vitals and nursing note reviewed.  Constitutional:      Appearance: Normal appearance.  HENT:     Head: Normocephalic and atraumatic.  Cardiovascular:     Rate and Rhythm: Normal rate and regular rhythm.     Pulses: Normal pulses.     Heart sounds: Normal heart sounds.  Skin:    General: Skin is warm and dry.  Neurological:     General: No focal deficit present.     Mental Status: She is alert and oriented to person, place, and time.     Gait: Gait abnormal (walks with slight waddling ).  Psychiatric:        Attention and Perception: Attention and perception normal.        Mood and Affect: Mood normal.        Speech: Speech normal.        Behavior: Behavior normal. Behavior is cooperative.        Thought Content: Thought content normal.        Cognition and Memory: She exhibits impaired recent memory.        Judgment: Judgment normal.       Assessment &  Plan:  1. Cognitive impairment - Mini Mental status exam = 20/30  - Will check labs and order MRI of head  - Consider referral to neurology  - CBC with Differential/Platelet; Future - Comprehensive metabolic panel; Future - Vitamin B12; Future - Urinalysis; Future - TSH; Future - TSH - CBC with Differential/Platelet - Comprehensive metabolic panel - Vitamin Z92 - Urinalysis - MR Brain Wo Contrast; Future  2. Gait instability  - Ambulatory referral to [place], NP

## 2020-08-26 ENCOUNTER — Encounter: Payer: Self-pay | Admitting: Family Medicine

## 2020-09-01 ENCOUNTER — Other Ambulatory Visit: Payer: Self-pay | Admitting: Internal Medicine

## 2020-09-03 ENCOUNTER — Telehealth: Payer: Self-pay

## 2020-09-03 NOTE — Telephone Encounter (Signed)
Inbound fax from [facility] requesting a Prescription Order Authorization form be completed and faxed to [phone] upon completion.

## 2020-09-04 ENCOUNTER — Other Ambulatory Visit: Payer: Self-pay

## 2020-09-04 ENCOUNTER — Ambulatory Visit (HOSPITAL_COMMUNITY)
Admission: RE | Admit: 2020-09-04 | Discharge: 2020-09-04 | Disposition: A | Discharge status: Home / Self Care | Payer: Medicare HMO | Source: Ambulatory Visit | Attending: Adult Health | Admitting: Adult Health

## 2020-09-04 DIAGNOSIS — S0990XA Unspecified injury of head, initial encounter: Secondary | ICD-10-CM | POA: Diagnosis not present

## 2020-09-04 DIAGNOSIS — R296 Repeated falls: Secondary | ICD-10-CM | POA: Diagnosis not present

## 2020-09-04 DIAGNOSIS — I6782 Cerebral ischemia: Secondary | ICD-10-CM | POA: Diagnosis not present

## 2020-09-04 DIAGNOSIS — I639 Cerebral infarction, unspecified: Secondary | ICD-10-CM | POA: Diagnosis not present

## 2020-09-04 DIAGNOSIS — R4189 Other symptoms and signs involving cognitive functions and awareness: Secondary | ICD-10-CM

## 2020-09-06 ENCOUNTER — Encounter: Payer: Self-pay | Admitting: Adult Health

## 2020-09-07 ENCOUNTER — Other Ambulatory Visit: Payer: Self-pay

## 2020-09-07 ENCOUNTER — Telehealth: Payer: Self-pay | Admitting: Adult Health

## 2020-09-07 NOTE — Telephone Encounter (Signed)
Tin is calling and wanted to give the report from patients MRI. Caller stated that matter was not urgent and could wait until provider is back in the office tomorrow.  CB [phone]

## 2020-09-08 ENCOUNTER — Telehealth: Payer: Self-pay | Admitting: *Deleted

## 2020-09-08 ENCOUNTER — Telehealth: Payer: Self-pay | Admitting: Adult Health

## 2020-09-08 ENCOUNTER — Encounter: Payer: Self-pay | Admitting: Family Medicine

## 2020-09-08 ENCOUNTER — Ambulatory Visit (INDEPENDENT_AMBULATORY_CARE_PROVIDER_SITE_OTHER): Payer: Medicare HMO

## 2020-09-08 ENCOUNTER — Ambulatory Visit (INDEPENDENT_AMBULATORY_CARE_PROVIDER_SITE_OTHER): Payer: Medicare HMO | Admitting: Family Medicine

## 2020-09-08 VITALS — BP 112/60 | HR 80 | Temp 97.4°F | Ht 64.0 in

## 2020-09-08 DIAGNOSIS — M545 Low back pain, unspecified: Secondary | ICD-10-CM

## 2020-09-08 DIAGNOSIS — R296 Repeated falls: Secondary | ICD-10-CM

## 2020-09-08 DIAGNOSIS — E161 Other hypoglycemia: Secondary | ICD-10-CM | POA: Diagnosis not present

## 2020-09-08 DIAGNOSIS — E162 Hypoglycemia, unspecified: Secondary | ICD-10-CM | POA: Diagnosis not present

## 2020-09-08 DIAGNOSIS — R404 Transient alteration of awareness: Secondary | ICD-10-CM | POA: Diagnosis not present

## 2020-09-08 DIAGNOSIS — R001 Bradycardia, unspecified: Secondary | ICD-10-CM | POA: Diagnosis not present

## 2020-09-08 DIAGNOSIS — R4189 Other symptoms and signs involving cognitive functions and awareness: Secondary | ICD-10-CM

## 2020-09-08 NOTE — Progress Notes (Signed)
Established Patient Office Visit  Subjective:  Patient ID: [name] [name], female    DOB: [birth year]  Age: 87 y.o. MRN: [id number]  CC:  Chief Complaint  Patient presents with   Fall    Patient complains of buttock pain since she fell 3 days ago   Hyperglycemia    Patients son states the paramedics came to the patients home earlier today due to diabetic coma, injection given, EKG peformed which was normal and offered and patient declined ER visit    HPI ARDELE LESPERANCE presents for lower lumbar back pain since fall this past Saturday.  She has history of recurrent falls.  She is accompanied by her son and daughter-in-law.  Patient has multiple chronic problems including type 2 diabetes.  She reports multiple recent hypoglycemic episodes.  She is not sure but thinks she may have been hypoglycemic Saturday.  She cannot recall a lot of details but she fell down and apparently landed hard on her buttock.  She has had some lower lumbar pain since that time.  Denies any hip pain.  No other injuries reported.  No head injury with that fall.  This morning patient reportedly had a seizure and EMS was called.  Son had just gone to her house when she was seizing.  Capillary glucose reportedly 44 when EMS got there.  This did eventually stabilize with glucose administration.  In looking back over her recent blood sugar she has had multiple readings below 60 over the past week or so.  She is currently on Trulicity 0.3 mg subcutaneous weekly, Amaryl 4 mg twice daily, Basaglar 32 units once daily, and NovoLog 20 to 30 units 3 times daily with meals.  Son states that she is not taking her medicines consistently.  It is not clear exactly what she has taken over the past few days.  Past Medical History:  Diagnosis Date   Asthma    DEGENERATIVE JOINT DISEASE, GENERALIZED 02/01/2007   Qualifier: Diagnosis of  By: Amador Cunas  MD, Janett Labella    DIARRHEA, CHRONIC 08/20/2009   Qualifier: Diagnosis of   By: Amador Cunas  MD, Janett Labella    DM 02/01/2007   Qualifier: Diagnosis of  By: Calvert Cantor     FATIGUE 10/20/2008   Qualifier: Diagnosis of  By: Amador Cunas  MD, Janett Labella    HYPERCHOLESTEROLEMIA 02/01/2007   Qualifier: Diagnosis of  By: Sharilyn Sites 12/20/2007   Qualifier: Diagnosis of  By: Amador Cunas  MD, Janett Labella    HYPERTENSION 02/01/2007   Qualifier: Diagnosis of  By: Army Melia BACK PAIN 01/13/2010   Qualifier: Diagnosis of  By: Amador Cunas  MD, Janett Labella    MUSCLE CRAMPS 04/06/2010   Qualifier: Diagnosis of  By: Amador Cunas  MD, Janett Labella    MYALGIA 01/20/2010   Qualifier: Diagnosis of  By: Caryl Never MD, Derrian Rodak     OSTEOARTHRITIS 01/13/2010   Qualifier: Diagnosis of  By: Amador Cunas  MD, Janett Labella    OVERACTIVE BLADDER 08/20/2009   Qualifier: Diagnosis of  By: Amador Cunas  MD, Janett Labella    PLEURISY 06/18/2008   Qualifier: Diagnosis of  By: Alphonzo Severance MD, Loni Dolly    PNEUMONIA 06/18/2008   Qualifier: Diagnosis of  By: Alphonzo Severance MD, Loni Dolly    Rectal prolapse 09/20/2007   Qualifier: Diagnosis of  By: Amador Cunas  MD, Janett Labella    VITAMIN B12 DEFICIENCY 07/20/2010   Qualifier: Diagnosis of  By:  Leone Payor MD, Charlyne Quale     Past Surgical History:  Procedure Laterality Date   ABDOMINAL HYSTERECTOMY     partial- states she has her ovaries   APPENDECTOMY     BLADDER SURGERY     CATARACT EXTRACTION Bilateral 2012   CHOLECYSTECTOMY      Family History  Problem Relation Age of Onset   Deep vein thrombosis Mother    COPD Father    Asthma Father    Diabetes Neg Hx        family hx   Cancer Neg Hx        breast ca - sister(S)?   Heart disease Neg Hx        family hx    Social History   Socioeconomic History   Marital status: Single    Spouse name: Not on file   Number of children: Not on file   Years of education: Not on file   Highest education level: Not on file  Occupational History   Not on file  Tobacco Use   Smoking  status: Former Smoker    Packs/day: 10.00   Smokeless tobacco: Never Used   Tobacco comment: quit > 82 yo   Substance and Sexual Activity   Alcohol use: No   Drug use: No   Sexual activity: Not on file  Other Topics Concern   Not on file  Social History Narrative   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    Married for 55 years, widowed for 22 years    Social Determinants of Corporate investment banker Strain: Low Risk    Difficulty of Paying Living Expenses: Not hard at all  Food Insecurity: No Food Insecurity   Worried About Programme researcher, broadcasting/film/video in the Last Year: Never true   Barista in the Last Year: Never true  Transportation Needs: No Transportation Needs   Lack of Transportation (Medical): No   Lack of Transportation (Non-Medical): No  Physical Activity: Inactive   Days of Exercise per Week: 0 days   Minutes of Exercise per Session: 0 min  Stress: No Stress Concern Present   Feeling of Stress : Not at all  Social Connections: Moderately Integrated   Frequency of Communication with Friends and Family: More than three times a week   Frequency of Social Gatherings with Friends and Family: More than three times a week   Attends Religious Services: More than 4 times per year   Active Member of [employer] or Organizations: Yes   Attends Banker Meetings: More than 4 times per year   Marital Status: Widowed  Catering manager Violence: Not At Risk   Fear of Current or Ex-Partner: No   Emotionally Abused: No   Physically Abused: No   Sexually Abused: No    Outpatient Medications Prior to Visit  Medication Sig Dispense Refill   Acetaminophen 650 MG TABS Take 1 tablet (650 mg total) by mouth 3 (three) times daily as needed. 30 tablet 0   aspirin 81 MG tablet Take 81 mg by mouth daily.     Blood Glucose Calibration (ACCU-CHEK GUIDE CONTROL) LIQD Use as directed 1 each 1   Blood Glucose Monitoring Suppl (ACCU-CHEK AVIVA PLUS) w/Device KIT Use to check  blood sugar 3 times a day 1 kit 0   Blood Glucose Monitoring Suppl (ACCU-CHEK GUIDE) w/Device KIT Use as directed 1 kit 0   calcium-vitamin D (OSCAL WITH D 500-200) 500-200 MG-UNIT per tablet Take 1 tablet  by mouth daily.     Continuous Blood Gluc Sensor (FREESTYLE LIBRE 14 DAY SENSOR) MISC Use to check blood glucose 3 times a day 6 each 0   cyanocobalamin 1000 MCG tablet Take 100 mcg by mouth daily.     diphenoxylate-atropine (LOMOTIL) 2.5-0.025 MG tablet Take 1 tablet by mouth 3 (three) times daily as needed for diarrhea or loose stools. 90 tablet 0   Dulaglutide (TRULICITY) 3 MG/0.5ML SOPN Inject 3 mg into the skin once a week. 12 pen 3   fluticasone (FLONASE) 50 MCG/ACT nasal spray Place 1 spray into the nose daily. 16 g 0   glimepiride (AMARYL) 4 MG tablet Take 1 tablet (4 mg total) by mouth 2 (two) times daily. 180 tablet 3   glucose blood (ACCU-CHEK AVIVA PLUS) test strip Use to check blood sugar 3 times a day. 300 strip 12   glucose blood (ACCU-CHEK GUIDE) test strip CHECK BLOOD SUGAR THREE TIMES DAILY 100 strip 0   hydrochlorothiazide (HYDRODIURIL) 25 MG tablet Take 1 tablet (25 mg total) by mouth daily. 90 tablet 3   ibuprofen (ADVIL,MOTRIN) 600 MG tablet TAKE 1 TABLET BY MOUTH EVERY 6 HOURS AS NEEDED FOR PAIN 60 tablet 1   Insulin Glargine (BASAGLAR KWIKPEN) 100 UNIT/ML INJECT 32 UNITS SUBCUTANEOUSLY AT BEDTIME 30 mL 0   Insulin Pen Needle (BD PEN NEEDLE NANO U/F) 32G X 4 MM MISC USE TO INJECT INSULIN 4 TIMES DAILY AS DIRECTED. 200 each 3   Lancets (ONETOUCH ULTRASOFT) lancets Use as instructed to test 3 times daily 300 each 5   Multiple Vitamin (MULTIVITAMIN) tablet Take 1 tablet by mouth daily.     NOVOLOG FLEXPEN 100 UNIT/ML FlexPen INJECT 20 TO  30 UNITS SUBCUTANEOUSLY THREE TIMES DAILY WITH MEALS 90 mL 1   simvastatin (ZOCOR) 40 MG tablet TAKE 1 TABLET EVERY DAY 90 tablet 2   trazodone (DESYREL) 300 MG tablet Take 1 tablet (300 mg total) by mouth at bedtime. 90  tablet 1   venlafaxine XR (EFFEXOR-XR) 75 MG 24 hr capsule Take 1 capsule (75 mg total) by mouth daily. 90 capsule 1   Accu-Chek Softclix Lancets lancets Use as instructed 100 each 12   No facility-administered medications prior to visit.    Allergies  Allergen Reactions   Metformin     REACTION: diarrhea   Sitagliptin Phosphate     REACTION: abdominal pain    ROS Review of Systems  Constitutional: Negative for chills and fever.  Respiratory: Negative for cough and shortness of breath.   Cardiovascular: Negative for chest pain.  Gastrointestinal: Negative for nausea and vomiting.  Genitourinary: Negative for dysuria.  Musculoskeletal: Positive for back pain.  Neurological: Negative for headaches.  Psychiatric/Behavioral: Negative for confusion.      Objective:    Physical Exam Vitals reviewed.  Constitutional:      Appearance: Normal appearance.  HENT:     Head: Normocephalic and atraumatic.  Cardiovascular:     Rate and Rhythm: Normal rate and regular rhythm.  Pulmonary:     Effort: Pulmonary effort is normal.     Breath sounds: Normal breath sounds.  Musculoskeletal:     Cervical back: Neck supple.     Right lower leg: No edema.     Left lower leg: No edema.     Comments: She has some mild tenderness over her lower lumbar spine to palpation.  No visible bruising or swelling. Good range of motion both hips without difficulty.  No areas of localized tenderness upper  extremities.  Neurological:     General: No focal deficit present.     Mental Status: She is alert.     Cranial Nerves: No cranial nerve deficit.     BP 112/60 (BP Location: Right Arm, Patient Position: Sitting, Cuff Size: Normal)    Pulse 80    Temp (!) 97.4 F (36.3 C) (Axillary)    Ht 5\' 4"  (1.626 m)    SpO2 96%    BMI 29.35 kg/m  Wt Readings from Last 3 Encounters:  08/21/20 171 lb (77.6 kg)  08/07/20 174 lb 7 oz (79.1 kg)  04/08/20 171 lb (77.6 kg)     Health Maintenance Due  Topic  Date Due   URINE MICROALBUMIN  06/01/2018   FOOT EXAM  12/27/2019   HEMOGLOBIN A1C  06/12/2020    There are no preventive care reminders to display for this patient.  Lab Results  Component Value Date   TSH 1.28 08/21/2020   Lab Results  Component Value Date   WBC 7.3 08/21/2020   HGB 13.2 08/21/2020   HCT 39.7 08/21/2020   MCV 94.2 08/21/2020   PLT 237.0 08/21/2020   Lab Results  Component Value Date   NA 137 08/21/2020   K 4.9 08/21/2020   CO2 28 08/21/2020   GLUCOSE 277 (H) 08/21/2020   BUN 23 08/21/2020   CREATININE 1.61 (H) 08/21/2020   BILITOT 0.4 08/21/2020   ALKPHOS 45 08/21/2020   AST 28 08/21/2020   ALT 17 08/21/2020   PROT 7.4 08/21/2020   ALBUMIN 4.0 08/21/2020   CALCIUM 9.3 08/21/2020   GFR 28.27 (L) 08/21/2020   Lab Results  Component Value Date   CHOL 162 04/08/2020   Lab Results  Component Value Date   HDL 60 04/08/2020   Lab Results  Component Value Date   LDLCALC 65 04/08/2020   Lab Results  Component Value Date   TRIG 284 (H) 04/08/2020   Lab Results  Component Value Date   CHOLHDL 2.7 04/08/2020   Lab Results  Component Value Date   HGBA1C 7.3 (A) 12/12/2019      Assessment & Plan:   #1 recent fall 3 days ago with lower lumbar back pain.  Rule out acute compression fracture. -Obtain x-rays lumbar spine to further assess -avoid NSAIDS -Tylenol as needed for pain.   #2 history of recurrent falls.  Sounds like at least 1 of these recently was related to hypoglycemia. -Handout given on fall prevention in the home.  Her son has looked at the home and try to reduce things like throw rugs. -We suggested possible home OT consult but she apparently has declined this in the past.  #3 recurrent hypoglycemia.  She has reported follow-up with endocrinologist this Friday.  She is currently on regimen of glimepiride 4 mg twice daily, Basaglar 32 units once daily, and NovoLog 20 to 30 units prior to meals.  There have been some question  by family that she is incorrectly taking her medications or not taking them consistently.  -Strongly advise no driving until further assessed by endocrinology -Consider holding her glimepiride until further assessed by endocrinology -Continue to check blood sugars regularly and avoid skipping meals -strongly advice close supervision of medications per family.  Would consider further assessment of her cognitive status at follow up with primary. -We recommend they take a log of her blood sugars in this Friday when they see endocrinologist to further assess  No orders of the defined types were placed in this  encounter.   Follow-up: No follow-ups on file.    Evelena Peat, MD

## 2020-09-08 NOTE — Patient Instructions (Signed)
Fall Prevention in the Home, Adult Falls can cause injuries and can affect people from all age groups. There are many simple things that you can do to make your home safe and to help prevent falls. Ask for help when making these changes, if needed. What actions can I take to prevent falls? General instructions  Use good lighting in all rooms. Replace any light bulbs that burn out.  Turn on lights if it is dark. Use night-lights.  Place frequently used items in easy-to-reach places. Lower the shelves around your home if necessary.  Set up furniture so that there are clear paths around it. Avoid moving your furniture around.  Remove throw rugs and other tripping hazards from the floor.  Avoid walking on wet floors.  Fix any uneven floor surfaces.  Add color or contrast paint or tape to grab bars and handrails in your home. Place contrasting color strips on the first and last steps of stairways.  When you use a stepladder, make sure that it is completely opened and that the sides are firmly locked. Have someone hold the ladder while you are using it. Do not climb a closed stepladder.  Be aware of any and all pets. What can I do in the bathroom?  Keep the floor dry. Immediately clean up any water that spills onto the floor.  Remove soap buildup in the tub or shower on a regular basis.  Use non-skid mats or decals on the floor of the tub or shower.  Attach bath mats securely with double-sided, non-slip rug tape.  If you need to sit down while you are in the shower, use a plastic, non-slip stool.  Install grab bars by the toilet and in the tub and shower. Do not use towel bars as grab bars.      What can I do in the bedroom?  Make sure that a bedside light is easy to reach.  Do not use oversized bedding that drapes onto the floor.  Have a firm chair that has side arms to use for getting dressed. What can I do in the kitchen?  Clean up any spills right away.  If you need to  reach for something above you, use a sturdy step stool that has a grab bar.  Keep electrical cables out of the way.  Do not use floor polish or wax that makes floors slippery. If you must use wax, make sure that it is non-skid floor wax. What can I do in the stairways?  Do not leave any items on the stairs.  Make sure that you have a light switch at the top of the stairs and the bottom of the stairs. Have them installed if you do not have them.  Make sure that there are handrails on both sides of the stairs. Fix handrails that are broken or loose. Make sure that handrails are as long as the stairways.  Install non-slip stair treads on all stairs in your home.  Avoid having throw rugs at the top or bottom of stairways, or secure the rugs with carpet tape to prevent them from moving.  Choose a carpet design that does not hide the edge of steps on the stairway.  Check any carpeting to make sure that it is firmly attached to the stairs. Fix any carpet that is loose or worn. What can I do on the outside of my home?  Use bright outdoor lighting.  Regularly repair the edges of walkways and driveways and fix any cracks.  Remove high doorway thresholds.  Trim any shrubbery on the main path into your home.  Regularly check that handrails are securely fastened and in good repair. Both sides of any steps should have handrails.  Install guardrails along the edges of any raised decks or porches.  Clear walkways of debris and clutter, including tools and rocks.  Have leaves, snow, and ice cleared regularly.  Use sand or salt on walkways during winter months.  In the garage, clean up any spills right away, including grease or oil spills. What other actions can I take?  Wear closed-toe shoes that fit well and support your feet. Wear shoes that have rubber soles or low heels.  Use mobility aids as needed, such as canes, walkers, scooters, and crutches.  Review your medicines with your  health care provider. Some medicines can cause dizziness or changes in blood pressure, which increase your risk of falling. Talk with your health care provider about other ways that you can decrease your risk of falls. This may include working with a physical therapist or trainer to improve your strength, balance, and endurance. Where to find more information  Centers for Disease Control and Prevention, [facility]: WebmailGuide.co.za  [employer] on Aging: BrainJudge.co.uk Contact a health care provider if:  You are afraid of falling at home.  You feel weak, drowsy, or dizzy at home.  You fall at home. Summary  There are many simple things that you can do to make your home safe and to help prevent falls.  Ways to make your home safe include removing tripping hazards and installing grab bars in the bathroom.  Ask for help when making these changes in your home. This information is not intended to replace advice given to you by your health care provider. Make sure you discuss any questions you have with your health care provider. Document Revised: 06/02/2017 Document Reviewed: 02/02/2017 Elsevier Patient Education  2021 [place].  I would consider HOLDING the Glimepiride until you see Dr Cruzita Lederer to discuss further.  Hypoglycemia Hypoglycemia occurs when the level of sugar (glucose) in the blood is too low. Hypoglycemia can happen in people who have or do not have diabetes. It can develop quickly, and it can be a medical emergency. For most people, a blood glucose level below 70 mg/dL (3.9 mmol/L) is considered hypoglycemia. Glucose is a type of sugar that provides the body's main source of energy. Certain hormones (insulin and glucagon) control the level of glucose in the blood. Insulin lowers blood glucose, and glucagon raises blood glucose. Hypoglycemia can result from having too much insulin in the bloodstream, or from not eating enough food that contains glucose. You  may also have reactive hypoglycemia, which happens within 4 hours after eating a meal. What are the causes? Hypoglycemia occurs most often in people who have diabetes and may be caused by: Diabetes medicine. Not eating enough, or not eating often enough. Increased physical activity. Drinking alcohol on an empty stomach. If you do not have diabetes, hypoglycemia may be caused by: A tumor in the pancreas. Not eating enough, or not eating for long periods at a time (fasting). A severe infection or illness. Problems after having bariatric surgery. Organ failure, such as kidney or liver failure. Certain medicines. What increases the risk? Hypoglycemia is more likely to develop in people who: Have diabetes and take medicines to lower blood glucose. Abuse alcohol. Have a severe illness. What are the signs or symptoms? Symptoms vary depending on whether the condition is mild,  moderate, or severe. Mild hypoglycemia Hunger. Anxiety. Sweating and feeling clammy. Dizziness or feeling light-headed. Sleepiness or restless sleep. Nausea. Increased heart rate. Headache. Blurry vision. Irritability. Tingling or numbness around the mouth, lips, or tongue. A change in coordination. Moderate hypoglycemia Confusion and poor judgment. Behavior changes. Weakness. Irregular heartbeat. Severe hypoglycemia Severe hypoglycemia is a medical emergency. It can cause: Fainting. Seizures. Loss of consciousness (coma). Death. How is this diagnosed? Hypoglycemia is diagnosed with a blood test to measure your blood glucose level. This blood test is done while you are having symptoms. Your health care provider may also do a physical exam and review your medical history. How is this treated? This condition can be treated by immediately eating or drinking something that contains sugar with 15 grams of rapid-acting carbohydrate, such as: 4 oz (120 mL) of fruit juice. 4-6 oz (120-150 mL) of regular soda  (not diet soda). 8 oz (240 mL) of low-fat milk. Several pieces of hard candy. Check food labels to find out how many to eat for 15 grams. 1 Tbsp (15 mL) of sugar or honey. Treating hypoglycemia if you have diabetes If you are alert and able to swallow safely, follow the 15:15 rule: Take 15 grams of a rapid-acting carbohydrate. Talk with your health care provider about how much you should take. Options for getting 15 grams of rapid-acting carbohydrate include: Glucose tablets (take 4 tablets). Several pieces of hard candy. Check food labels to find out how many pieces to eat for 15 grams. 4 oz (120 mL) of fruit juice. 4-6 oz (120-150 mL) of regular (not diet) soda. 1 Tbsp (15 mL) of honey or sugar. Check your blood glucose 15 minutes after you take the carbohydrate. If the repeat blood glucose level is still at or below 70 mg/dL (3.9 mmol/L), take 15 grams of a carbohydrate again. If your blood glucose level does not increase above 70 mg/dL (3.9 mmol/L) after 3 tries, seek emergency medical care. After your blood glucose level returns to normal, eat a meal or a snack within 1 hour.   Treating severe hypoglycemia Severe hypoglycemia is when your blood glucose level is at or below 54 mg/dL (3 mmol/L). Severe hypoglycemia is a medical emergency. Get medical help right away. If you have severe hypoglycemia and you cannot eat or drink, you will need to be given glucagon. A family member or close friend should learn how to check your blood glucose and how to give you glucagon. Ask your health care provider if you need to have an emergency glucagon kit available. Severe hypoglycemia may need to be treated in a hospital. The treatment may include getting glucose through an IV. You may also need treatment for the cause of your hypoglycemia. Follow these instructions at home: General instructions Take over-the-counter and prescription medicines only as told by your health care provider. Monitor your  blood glucose as told by your health care provider. If you drink alcohol: Limit how much you use to: 0-1 drink a day for nonpregnant women. 0-2 drinks a day for men. Be aware of how much alcohol is in your drink. In the [place] one drink equals one 12 oz bottle of beer (355 mL), one 5 oz glass of wine (148 mL), or one 1 oz glass of hard liquor (44 mL). Keep all follow-up visits as told by your health care provider. This is important. If you have diabetes: Always have a rapid-acting carbohydrate (15 grams) option with you to treat low blood glucose. Follow your  diabetes management plan as directed. Make sure you: Know the symptoms of hypoglycemia. It is important to treat it right away to prevent it from becoming severe. Check your blood glucose as often as told. Always check before and after exercise. Always check your blood glucose before you drive a motorized vehicle. Take your medicines as told. Follow your meal plan. Eat on time, and do not skip meals. Share your diabetes management plan with people in your workplace, school, and household. Carry a medical alert card or wear medical alert jewelry.   Contact a health care provider if: You have problems keeping your blood glucose in your target range. You have frequent episodes of hypoglycemia. Get help right away if: You continue to have hypoglycemia symptoms after eating or drinking something that contains 15 grams of fast-acting carbohydrate and you cannot get your blood glucose above 70 mg/dL (3.9 mmol/L) while following the 15:15 rule. Your blood glucose is at or below 54 mg/dL (3 mmol/L). You have a seizure. You faint. These symptoms may represent a serious problem that is an emergency. Do not wait to see if the symptoms will go away. Get medical help right away. Call your local emergency services (911 in the [place] Do not drive yourself to the hospital. Summary Hypoglycemia occurs when the level of sugar (glucose) in the blood is  too low. Hypoglycemia can happen in people who have or do not have diabetes. It can develop quickly, and it can be a medical emergency. Make sure you know the symptoms of hypoglycemia and how to treat it. Always have a rapid-acting carbohydrate snack with you to treat low blood sugar. This information is not intended to replace advice given to you by your health care provider. Make sure you discuss any questions you have with your health care provider. Document Revised: 05/15/2019 Document Reviewed: 05/15/2019 Elsevier Patient Education  2021 [employer].

## 2020-09-08 NOTE — Telephone Encounter (Signed)
Imaging center called to make sure PCP was aware MRI of brain was finalized and in [facility]

## 2020-09-08 NOTE — Telephone Encounter (Signed)
Spoke to patients daughter and informed her of MRI of head   IMPRESSION: 1. 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 2. No reversible finding.  No specific atrophy pattern.  Patient is on ASA 81 mg but has frequent falls, would not place on blood thinner such as Xaralto.   Will refer to Neurology for further evaluation.

## 2020-09-09 ENCOUNTER — Ambulatory Visit: Payer: Medicare HMO | Admitting: Adult Health

## 2020-09-09 ENCOUNTER — Encounter: Payer: Self-pay | Admitting: Neurology

## 2020-09-09 ENCOUNTER — Ambulatory Visit: Payer: Medicare HMO | Admitting: Neurology

## 2020-09-09 ENCOUNTER — Other Ambulatory Visit: Payer: Self-pay

## 2020-09-09 VITALS — BP 122/78 | HR 85 | Ht 63.0 in | Wt 170.0 lb

## 2020-09-09 DIAGNOSIS — I639 Cerebral infarction, unspecified: Secondary | ICD-10-CM | POA: Diagnosis not present

## 2020-09-09 DIAGNOSIS — F01518 Vascular dementia, unspecified severity, with other behavioral disturbance: Secondary | ICD-10-CM

## 2020-09-09 DIAGNOSIS — F0151 Vascular dementia with behavioral disturbance: Secondary | ICD-10-CM | POA: Diagnosis not present

## 2020-09-09 NOTE — Progress Notes (Signed)
NEUROLOGY CONSULTATION NOTE  [name] [name] MRN: [id number] DOB: [birth year]  Referring provider: Dorothyann Peng, NP Primary care provider: Dorothyann Peng, NP  Reason for consult:  Cognitive impairment   Thank you for your kind referral of [name] [name] for consultation of the above symptoms. Although her history is well known to you, please allow me to reiterate it for the purpose of our medical record. The patient was accompanied to the clinic by her daughter [name] [name] and her son [name] [name] on speakerphone who also provide collateral information. Records and images were personally reviewed where available.   HISTORY OF PRESENT ILLNESS: This is an 87 year old right-handed woman with a history of hyperlipidemia, hypertension, diabetes, osteoarthritis, overactive bladder, B12 deficiency, chronic diarrhea, anxiety, presenting for evaluation of memory impairment. She was also having frequent falls. She fell in December, slipping on wet leaves, hitting her head on the ground. She fell in February getting out of bed and hitting her face on the floor. Since the falls, family noticed decline in cognitive function. She will forget she is making food on the stove and walk away. She would not remember calling family members. She had continued to drive and denied getting lost. MMSE 20/30 at PCP office in 08/2020. She had a brain MRI without contrast on 09/05/20 which showed 2 subcentimeter areas of weakly restricted diffusion in the left centrum semiovale and right frontal white matter best attributed to small vessel infarcts in the setting of extensive chronic microvascular disease.   She feels her memory is okay. She lives alone. Her son has taken over bill payments, she tries to write them but had missed payments and double paid some. She forget her medications, her other son fixes her medications but she forgets what she has taken or what she is supposed to take. She denies leaving the stove on but family  notes she would leave food out on the stove and would be there for a couple of days (not new). [name] [name] started noticing memory changes around December, they could tell her memory had decreased but she would make light of it. She has always been one who "if she does not want to do it, she would not do it." Family would tried to call her to remind her about her medications but she would not answer the phone. Her other son checks her glucose levels but she does not eat like she is supposed to, she like sweets. Glucose levels are labile, yesterday it was 44, then it was 240 this morning. Family had her stop driving 2 days ago, as far as they know she has not gotten lost but there have been a few dings on the car lately. She is independent with dressing and bathing. Family reports she lays all her laundry on the bed, which is not new. Since December, family also noticed more personality changes, she would do or say things out of character, no paranoia or hallucinations. [name] [name] thinks she has some depression, he can see a mood change. For instance, her grandchildren visited but she did not carry on conversations and spent more time with the dog. She is moody when she does not feel good, and does not feel good a lot of times saying she is tired. She had a UTI a couple of years ago and had delirium.   She denies any headaches, dizziness, diplopia, dysarthria/dysphagia, neck/back pain, focal numbness/tingling/weakness, anosmia, or tremors. She has bowel incontinence and has started wearing adult diapers. She has always had  loose stools but today had an accident in the building just prior to her appointment. She has more control with urination. She does not sleep well, she is on Trazodone and takes naps. She had a fall last month getting out of bed, then another fall last Saturday, She now has a cane and walker.    Laboratory Data: Lab Results  Component Value Date   HGBA1C 7.3 (A) 12/12/2019   Lab Results  Component  Value Date   CHOL 162 04/08/2020   HDL 60 04/08/2020   LDLCALC 65 04/08/2020   LDLDIRECT 112.0 12/27/2018   TRIG 284 (H) 04/08/2020   CHOLHDL 2.7 04/08/2020    Lab Results  Component Value Date   TSH 1.28 08/21/2020   Lab Results  Component Value Date   VITAMINB12 1,124 (H) 08/21/2020     PAST MEDICAL HISTORY: Past Medical History:  Diagnosis Date  . Asthma   . DEGENERATIVE JOINT DISEASE, GENERALIZED 02/01/2007   Qualifier: Diagnosis of  By: Burnice Logan  MD, Doretha Sou   . DIARRHEA, CHRONIC 08/20/2009   Qualifier: Diagnosis of  By: Burnice Logan  MD, Doretha Sou   . DM 02/01/2007   Qualifier: Diagnosis of  By: Floyde Parkins    . FATIGUE 10/20/2008   Qualifier: Diagnosis of  By: Burnice Logan  MD, Doretha Sou   . HYPERCHOLESTEROLEMIA 02/01/2007   Qualifier: Diagnosis of  By: Floyde Parkins    . HYPERLIPIDEMIA 12/20/2007   Qualifier: Diagnosis of  By: Burnice Logan  MD, Doretha Sou   . HYPERTENSION 02/01/2007   Qualifier: Diagnosis of  By: Floyde Parkins    . LOW BACK PAIN 01/13/2010   Qualifier: Diagnosis of  By: Burnice Logan  MD, Doretha Sou MUSCLE CRAMPS 04/06/2010   Qualifier: Diagnosis of  By: Burnice Logan  MD, Doretha Sou   . MYALGIA 01/20/2010   Qualifier: Diagnosis of  By: Elease Hashimoto MD, [place]    . OSTEOARTHRITIS 01/13/2010   Qualifier: Diagnosis of  By: Burnice Logan  MD, Doretha Sou   . OVERACTIVE BLADDER 08/20/2009   Qualifier: Diagnosis of  By: Burnice Logan  MD, Doretha Sou   . PLEURISY 06/18/2008   Qualifier: Diagnosis of  By: Niel Hummer MD, [place] PNEUMONIA 06/18/2008   Qualifier: Diagnosis of  By: Niel Hummer MD, [place] Rectal prolapse 09/20/2007   Qualifier: Diagnosis of  By: Burnice Logan  MD, Doretha Sou   . VITAMIN B12 DEFICIENCY 07/20/2010   Qualifier: Diagnosis of  By: Carlean Purl MD, Dimas Millin     PAST SURGICAL HISTORY: Past Surgical History:  Procedure Laterality Date  . ABDOMINAL HYSTERECTOMY     partial- states she has her ovaries  . APPENDECTOMY    . BLADDER SURGERY    . CATARACT  EXTRACTION Bilateral 2012  . CHOLECYSTECTOMY      MEDICATIONS: Current Outpatient Medications on File Prior to Visit  Medication Sig Dispense Refill  . Acetaminophen 650 MG TABS Take 1 tablet (650 mg total) by mouth 3 (three) times daily as needed. 30 tablet 0  . aspirin 81 MG tablet Take 81 mg by mouth daily.    . Blood Glucose Calibration (ACCU-CHEK GUIDE CONTROL) LIQD Use as directed 1 each 1  . Blood Glucose Monitoring Suppl (ACCU-CHEK AVIVA PLUS) w/Device KIT Use to check blood sugar 3 times a day 1 kit 0  . Blood Glucose Monitoring Suppl (ACCU-CHEK GUIDE) w/Device KIT Use as directed 1 kit 0  . calcium-vitamin D (OSCAL WITH D 500-200) 500-200  MG-UNIT per tablet Take 1 tablet by mouth daily.    . Continuous Blood Gluc Sensor (FREESTYLE LIBRE 14 DAY SENSOR) MISC Use to check blood glucose 3 times a day 6 each 0  . cyanocobalamin 1000 MCG tablet Take 100 mcg by mouth daily.    . diphenoxylate-atropine (LOMOTIL) 2.5-0.025 MG tablet Take 1 tablet by mouth 3 (three) times daily as needed for diarrhea or loose stools. 90 tablet 0  . Dulaglutide (TRULICITY) 3 SJ/6.2EZ SOPN Inject 3 mg into the skin once a week. 12 pen 3  . fluticasone (FLONASE) 50 MCG/ACT nasal spray Place 1 spray into the nose daily. 16 g 0  . glimepiride (AMARYL) 4 MG tablet Take 1 tablet (4 mg total) by mouth 2 (two) times daily. 180 tablet 3  . glucose blood (ACCU-CHEK AVIVA PLUS) test strip Use to check blood sugar 3 times a day. 300 strip 12  . glucose blood (ACCU-CHEK GUIDE) test strip CHECK BLOOD SUGAR THREE TIMES DAILY 100 strip 0  . hydrochlorothiazide (HYDRODIURIL) 25 MG tablet Take 1 tablet (25 mg total) by mouth daily. 90 tablet 3  . ibuprofen (ADVIL,MOTRIN) 600 MG tablet TAKE 1 TABLET BY MOUTH EVERY 6 HOURS AS NEEDED FOR PAIN 60 tablet 1  . Insulin Glargine (BASAGLAR KWIKPEN) 100 UNIT/ML INJECT 32 UNITS SUBCUTANEOUSLY AT BEDTIME 30 mL 0  . Insulin Pen Needle (BD PEN NEEDLE NANO U/F) 32G X 4 MM MISC USE TO INJECT  INSULIN 4 TIMES DAILY AS DIRECTED. 200 each 3  . Lancets (ONETOUCH ULTRASOFT) lancets Use as instructed to test 3 times daily 300 each 5  . Multiple Vitamin (MULTIVITAMIN) tablet Take 1 tablet by mouth daily.    Marland Kitchen NOVOLOG FLEXPEN 100 UNIT/ML FlexPen INJECT 20 TO  30 UNITS SUBCUTANEOUSLY THREE TIMES DAILY WITH MEALS 90 mL 1  . simvastatin (ZOCOR) 40 MG tablet TAKE 1 TABLET EVERY DAY 90 tablet 2  . trazodone (DESYREL) 300 MG tablet Take 1 tablet (300 mg total) by mouth at bedtime. 90 tablet 1  . venlafaxine XR (EFFEXOR-XR) 75 MG 24 hr capsule Take 1 capsule (75 mg total) by mouth daily. 90 capsule 1   No current facility-administered medications on file prior to visit.    ALLERGIES: Allergies  Allergen Reactions  . Metformin     REACTION: diarrhea  . Sitagliptin Phosphate     REACTION: abdominal pain    FAMILY HISTORY: Family History  Problem Relation Age of Onset  . Deep vein thrombosis Mother   . COPD Father   . Asthma Father   . Diabetes Neg Hx        family hx  . Cancer Neg Hx        breast ca - sister(S)?  Marland Kitchen Heart disease Neg Hx        family hx    SOCIAL HISTORY: Social History   Socioeconomic History  . Marital status: Single    Spouse name: Not on file  . Number of children: Not on file  . Years of education: Not on file  . Highest education level: Not on file  Occupational History  . Not on file  Tobacco Use  . Smoking status: Former Smoker    Packs/day: 10.00  . Smokeless tobacco: Never Used  . Tobacco comment: quit > 42 yo   Substance and Sexual Activity  . Alcohol use: No  . Drug use: No  . Sexual activity: Not on file  Other Topics Concern  . Not on file  Social  History Narrative   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    Married for 77 years, widowed for 22 years    Social Determinants of Radio broadcast assistant Strain: Low Risk   . Difficulty of Paying Living Expenses: Not hard at all  Food Insecurity: No Food Insecurity  . Worried About Sales executive in the Last Year: Never true  . Ran Out of Food in the Last Year: Never true  Transportation Needs: No Transportation Needs  . Lack of Transportation (Medical): No  . Lack of Transportation (Non-Medical): No  Physical Activity: Inactive  . Days of Exercise per Week: 0 days  . Minutes of Exercise per Session: 0 min  Stress: No Stress Concern Present  . Feeling of Stress : Not at all  Social Connections: Moderately Integrated  . Frequency of Communication with Friends and Family: More than three times a week  . Frequency of Social Gatherings with Friends and Family: More than three times a week  . Attends Religious Services: More than 4 times per year  . Active Member of Clubs or Organizations: Yes  . Attends Archivist Meetings: More than 4 times per year  . Marital Status: Widowed  Intimate Partner Violence: Not At Risk  . Fear of Current or Ex-Partner: No  . Emotionally Abused: No  . Physically Abused: No  . Sexually Abused: No     PHYSICAL EXAM: Vitals:   09/09/20 1307  BP: 122/78  Pulse: 85  SpO2: 95%   General: No acute distress, sitting on wheelchair Head:  Normocephalic/atraumatic Skin/Extremities: No rash, no edema Neurological Exam: Mental status: alert and oriented to person, place, and time, no dysarthria or aphasia, Fund of knowledge is appropriate.  Recent and remote memory are impaired.  Attention and concentration are reduced.  Able to name objects and repeat phrases. [facility] 16/30 [place] Cognitive Assessment  09/09/2020  Visuospatial/ Executive (0/5) 2  Naming (0/3) 2  Attention: Read list of digits (0/2) 2  Attention: Read list of letters (0/1) 0  Attention: Serial 7 subtraction starting at 100 (0/3) 1  Language: Repeat phrase (0/2) 1  Language : Fluency (0/1) 0  Abstraction (0/2) 2  Delayed Recall (0/5) 0  Orientation (0/6) 5  Total 15  Adjusted Score (based on education) 16    Cranial nerves: CN I: not tested CN II: pupils equal,  round and reactive to light, visual fields intact CN III, IV, VI:  full range of motion, no nystagmus, no ptosis CN V: facial sensation intact CN VII: upper and lower face symmetric CN VIII: hearing intact to conversation CN XI: sternocleidomastoid and trapezius muscles intact CN XII: tongue midline Bulk & Tone: normal, no fasciculations. Motor: 5/5 throughout with no pronator drift. Sensation: intact to light touch, cold, pin, decreased vibration sense to ankles.  Deep Tendon Reflexes: +1 throughout Cerebellar: no incoordination on finger to nose testing Gait: not tested Tremor: none   IMPRESSION: This is an 87 year old right-handed woman with a history of hyperlipidemia, hypertension, diabetes, osteoarthritis, overactive bladder, B12 deficiency, chronic diarrhea, anxiety, presenting for evaluation of memory impairment. Her neurological exam is non-focal, MOCA score 16/30. We discussed brain MRI showing 2 subcentimeter areas of weakly restricted diffusion in the left centrum semiovale and right frontal white matter best attributed to small vessel infarcts in the setting of extensive chronic microvascular disease. Cognitive changes likely due to vascular dementia. Continue daily aspirin, control of vascular risk factors, continue follow-up Endocrinology. Continue close supervision,  discussed increasing need for supervision as she lives alone. Discussed medications in dementia, at this point side effects likely outweigh potential benefits, which they agree with. We discussed the importance of physical exercise and brain stimulation exercises for brain health. She does not drive. Follow-up in 6-8 months, they know to call for any changes.    Thank you for allowing me to participate in the care of this patient. Please do not hesitate to call for any questions or concerns.   Ellouise Newer, M.D.  CC: Dorothyann Peng, NP

## 2020-09-09 NOTE — Patient Instructions (Signed)
1. Follow-up with your endocrinologist as scheduled  2. Continue close supervision  3. Follow-up in 6-8 months, call for any changes   FALL PRECAUTIONS: Be cautious when walking. Scan the area for obstacles that may increase the risk of trips and falls. When getting up in the mornings, sit up at the edge of the bed for a few minutes before getting out of bed. Consider elevating the bed at the head end to avoid drop of blood pressure when getting up. Walk always in a well-lit room (use night lights in the walls). Avoid area rugs or power cords from appliances in the middle of the walkways. Use a walker or a cane if necessary and consider physical therapy for balance exercise. Get your eyesight checked regularly.  FINANCIAL OVERSIGHT: Supervision, especially oversight when making financial decisions or transactions is also recommended.  HOME SAFETY: Consider the safety of the kitchen when operating appliances like stoves, microwave oven, and blender. Consider having supervision and share cooking responsibilities until no longer able to participate in those. Accidents with firearms and other hazards in the house should be identified and addressed as well.   ABILITY TO BE LEFT ALONE: If patient is unable to contact 911 operator, consider using LifeLine, or when the need is there, arrange for someone to stay with patients. Smoking is a fire hazard, consider supervision or cessation. Risk of wandering should be assessed by caregiver and if detected at any point, supervision and safe proof recommendations should be instituted.  MEDICATION SUPERVISION: Inability to self-administer medication needs to be constantly addressed. Implement a mechanism to ensure safe administration of the medications.  RECOMMENDATIONS FOR ALL PATIENTS WITH MEMORY PROBLEMS: 1. Continue to exercise (Recommend 30 minutes of walking everyday, or 3 hours every week) 2. Increase social interactions - continue going to [place] and  enjoy social gatherings with friends and family 3. Eat healthy, avoid fried foods and eat more fruits and vegetables 4. Maintain adequate blood pressure, blood sugar, and blood cholesterol level. Reducing the risk of stroke and cardiovascular disease also helps promoting better memory. 5. Avoid stressful situations. Live a simple life and avoid aggravations. Organize your time and prepare for the next day in anticipation. 6. Sleep well, avoid any interruptions of sleep and avoid any distractions in the bedroom that may interfere with adequate sleep quality 7. Avoid sugar, avoid sweets as there is a strong link between excessive sugar intake, diabetes, and cognitive impairment We discussed the Mediterranean diet, which has been shown to help patients reduce the risk of progressive memory disorders and reduces cardiovascular risk. This includes eating fish, eat fruits and green leafy vegetables, nuts like almonds and hazelnuts, walnuts, and also use olive oil. Avoid fast foods and fried foods as much as possible. Avoid sweets and sugar as sugar use has been linked to worsening of memory function.  There is always a concern of gradual progression of memory problems. If this is the case, then we may need to adjust level of care according to patient needs. Support, both to the patient and caregiver, should then be put into place.

## 2020-09-11 ENCOUNTER — Ambulatory Visit: Payer: Medicare HMO | Admitting: Internal Medicine

## 2020-09-11 ENCOUNTER — Other Ambulatory Visit: Payer: Self-pay

## 2020-09-11 ENCOUNTER — Encounter: Payer: Self-pay | Admitting: Internal Medicine

## 2020-09-11 VITALS — BP 118/80 | HR 69 | Ht 63.0 in | Wt 169.2 lb

## 2020-09-11 DIAGNOSIS — E663 Overweight: Secondary | ICD-10-CM | POA: Diagnosis not present

## 2020-09-11 DIAGNOSIS — Z794 Long term (current) use of insulin: Secondary | ICD-10-CM

## 2020-09-11 DIAGNOSIS — E1121 Type 2 diabetes mellitus with diabetic nephropathy: Secondary | ICD-10-CM | POA: Diagnosis not present

## 2020-09-11 DIAGNOSIS — E7800 Pure hypercholesterolemia, unspecified: Secondary | ICD-10-CM

## 2020-09-11 LAB — POCT GLYCOSYLATED HEMOGLOBIN (HGB A1C): Hemoglobin A1C: 6.9 % — AB (ref 4.0–5.6)

## 2020-09-11 MED ORDER — GLUCAGON 3 MG/DOSE NA POWD: 3.0000 mg | Freq: Once | NASAL | 11 refills | Status: AC | PRN

## 2020-09-11 NOTE — Progress Notes (Addendum)
Patient ID: [name] [name], female   DOB: [birth year], 87 y.o.   MRN: [id number]   This visit occurred during the SARS-CoV-2 public health emergency.  Safety protocols were in place, including screening questions prior to the visit, additional usage of staff PPE, and extensive cleaning of exam room while observing appropriate contact time as indicated for disinfecting solutions.   HPI: [name] [name] is a 87 y.o.-year-old female, returning for f/u for DM2, dx 2005, insulin-dependent, uncontrolled, with complications (nephropathy). Last visit 9 months ago.  She is accompanied by her daughter and the son also participates in the visit by phone -they offer most of the medical information as patient is mostly nonverbal. She has Brewing technologist.  Since last visit, she had low blood sugar episodes, lowest being at 44, when she had a seizure.  Also, she had several falls.  Of note, she does have vascular dementia with behavioral disturbance and sees neurology.  Most recent CMP 2 weeks ago showed a low GFR of 28, and a glucose of 277.  Recent brain MRI (09/05/2020) report reviewed: Small subacute infarcts in bilateral cerebral white matter superimposed on chronic small vessel disease  Reviewed HbA1c levels: Lab Results  Component Value Date   HGBA1C 7.3 (A) 12/12/2019   HGBA1C 7.4 (A) 08/15/2019   HGBA1C 7.3 (A) 04/15/2019   HGBA1C 7.2 11/15/2018   HGBA1C 6.6 (A) 07/16/2018   HGBA1C 6.6 (A) 03/15/2018   HGBA1C 7.2 (A) 12/12/2017   HGBA1C 8.4% 10/05/2017   HGBA1C 8.3 07/06/2017   HGBA1C 8.3 02/24/2017   Pt is on a regimen of: - >> stopped 09/28/2020 - Basaglar 32 units at bedtime  - held in the last 2 days - Trulicity 1.5 >> 3 mg weekly - NovoLog   20-25 units before b'fast or lunch >> 30 units at the start of the meal or after  26-28 units before dinner >> 30 units at the start of the meal or after We had to stop Metformin 2/2 increased creatinine. Stopped Trulicity 91/9166 as she  could not afford it anymore.  Pt.checks her sugars more than 4 times a day with her CGM -   She obtained this since last visit.  Freestyle libre CGM parameters: - Average: 118 - % active CGM time: 63% of the time - Glucose variability 67.3% (target < or = to 36%) -  GMI: 6.1% - time in range:  - very low (<54): 22% - low (54-69): 17% - normal range (70-180): 37% - high sugars (181-250): 16% - very high sugars (>250): 8%     Previously: - am: 90-175, 190 >> 87, 99-150, 166 >> 115-130 - 2h after b'fast: 95, 114-200 >> 112-215 >> 99-173 - lunch:n/c >> 161, 203 >> 189-202 >> 150 >> 180, 223 - 2h after lunch: 120-150 >> n/c >> 135 >> n/c  - before dinner:  n/c >> 136 >> 180-216 >> n/c >> 179 - later:  171-290 >> 216-291 >> 183 >> 142-224 - bedtime:  110, 117, 179-299, 301 >> 189-248, 285  - night: 142, 149 Lowest sugar was 90 >> 87 >> 99 >> 44. She has hypoglycemia awareness in the 70s. Highest 330 >> 301 >> 285 >> 300s  + CKD stage III; last BUN/Cr: Lab Results  Component Value Date   BUN 23 08/21/2020   CREATININE 1.61 (H) 08/21/2020   + HL; last set of lipids: Lab Results  Component Value Date   CHOL 162 04/08/2020   HDL 60 04/08/2020  LDLCALC 65 04/08/2020   LDLDIRECT 112.0 12/27/2018   TRIG 284 (H) 04/08/2020   CHOLHDL 2.7 04/08/2020  On simvastatin 40. Pt's last eye exam was: 04/10/2020: No DR - Dr. Susa Loffler at [facility].  She denies numbness and tingling in her feet  ROS: Constitutional: no weight gain/no weight loss, no fatigue, no subjective hyperthermia, no subjective hypothermia Eyes: no blurry vision, no xerophthalmia ENT: no sore throat, no nodules palpated in neck, no dysphagia, no odynophagia, no hoarseness Cardiovascular: no CP/no SOB/no palpitations/no leg swelling Respiratory: no cough/no SOB/no wheezing Gastrointestinal: no N/no V/no D/no C/no acid reflux Musculoskeletal: no muscle aches/no joint aches Skin: no  rashes, no hair loss Neurological: no tremors/no numbness/no tingling/no dizziness  I reviewed pt's medications, allergies, PMH, social hx, family hx, and changes were documented in the history of present illness. Otherwise, unchanged from my initial visit note.  Past Medical History:  Diagnosis Date  . Asthma   . DEGENERATIVE JOINT DISEASE, GENERALIZED 02/01/2007   Qualifier: Diagnosis of  By: Burnice Logan  MD, Doretha Sou   . DIARRHEA, CHRONIC 08/20/2009   Qualifier: Diagnosis of  By: Burnice Logan  MD, Doretha Sou   . DM 02/01/2007   Qualifier: Diagnosis of  By: Floyde Parkins    . FATIGUE 10/20/2008   Qualifier: Diagnosis of  By: Burnice Logan  MD, Doretha Sou   . HYPERCHOLESTEROLEMIA 02/01/2007   Qualifier: Diagnosis of  By: Floyde Parkins    . HYPERLIPIDEMIA 12/20/2007   Qualifier: Diagnosis of  By: Burnice Logan  MD, Doretha Sou   . HYPERTENSION 02/01/2007   Qualifier: Diagnosis of  By: Floyde Parkins    . LOW BACK PAIN 01/13/2010   Qualifier: Diagnosis of  By: Burnice Logan  MD, Doretha Sou MUSCLE CRAMPS 04/06/2010   Qualifier: Diagnosis of  By: Burnice Logan  MD, Doretha Sou   . MYALGIA 01/20/2010   Qualifier: Diagnosis of  By: Elease Hashimoto MD, [place]    . OSTEOARTHRITIS 01/13/2010   Qualifier: Diagnosis of  By: Burnice Logan  MD, Doretha Sou   . OVERACTIVE BLADDER 08/20/2009   Qualifier: Diagnosis of  By: Burnice Logan  MD, Doretha Sou   . PLEURISY 06/18/2008   Qualifier: Diagnosis of  By: Niel Hummer MD, [place] PNEUMONIA 06/18/2008   Qualifier: Diagnosis of  By: Niel Hummer MD, [place] Rectal prolapse 09/20/2007   Qualifier: Diagnosis of  By: Burnice Logan  MD, Doretha Sou   . VITAMIN B12 DEFICIENCY 07/20/2010   Qualifier: Diagnosis of  By: Carlean Purl MD, Dimas Millin    Past Surgical History:  Procedure Laterality Date  . ABDOMINAL HYSTERECTOMY     partial- states she has her ovaries  . APPENDECTOMY    . BLADDER SURGERY    . CATARACT EXTRACTION Bilateral 2012  . CHOLECYSTECTOMY     Social History   Socioeconomic History   . Marital status: Single    Spouse name: Not on file  . Number of children: Not on file  . Years of education: Not on file  . Highest education level: Not on file  Occupational History  . Not on file  Tobacco Use  . Smoking status: Former Smoker    Packs/day: 10.00  . Smokeless tobacco: Never Used  . Tobacco comment: quit > 89 yo   Vaping Use  . Vaping Use: Never used  Substance and Sexual Activity  . Alcohol use: No  . Drug use: No  . Sexual activity: Not on file  Other Topics Concern  . Not on file  Social History Narrative   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    Married for 49 years, widowed for 22 years    Right handed    Lives alone   Social Determinants of Health   Financial Resource Strain: Low Risk   . Difficulty of Paying Living Expenses: Not hard at all  Food Insecurity: No Food Insecurity  . Worried About Charity fundraiser in the Last Year: Never true  . Ran Out of Food in the Last Year: Never true  Transportation Needs: No Transportation Needs  . Lack of Transportation (Medical): No  . Lack of Transportation (Non-Medical): No  Physical Activity: Inactive  . Days of Exercise per Week: 0 days  . Minutes of Exercise per Session: 0 min  Stress: No Stress Concern Present  . Feeling of Stress : Not at all  Social Connections: Moderately Integrated  . Frequency of Communication with Friends and Family: More than three times a week  . Frequency of Social Gatherings with Friends and Family: More than three times a week  . Attends Religious Services: More than 4 times per year  . Active Member of Clubs or Organizations: Yes  . Attends Archivist Meetings: More than 4 times per year  . Marital Status: Widowed  Intimate Partner Violence: Not At Risk  . Fear of Current or Ex-Partner: No  . Emotionally Abused: No  . Physically Abused: No  . Sexually Abused: No   Current Outpatient Medications on File Prior to Visit  Medication Sig Dispense Refill  . Acetaminophen 650  MG TABS Take 1 tablet (650 mg total) by mouth 3 (three) times daily as needed. 30 tablet 0  . aspirin 81 MG tablet Take 81 mg by mouth daily.    . Blood Glucose Calibration (ACCU-CHEK GUIDE CONTROL) LIQD Use as directed 1 each 1  . Blood Glucose Monitoring Suppl (ACCU-CHEK AVIVA PLUS) w/Device KIT Use to check blood sugar 3 times a day 1 kit 0  . Blood Glucose Monitoring Suppl (ACCU-CHEK GUIDE) w/Device KIT Use as directed 1 kit 0  . calcium-vitamin D (OSCAL WITH D 500-200) 500-200 MG-UNIT per tablet Take 1 tablet by mouth daily.    . Continuous Blood Gluc Sensor (FREESTYLE LIBRE 14 DAY SENSOR) MISC Use to check blood glucose 3 times a day 6 each 0  . cyanocobalamin 1000 MCG tablet Take 100 mcg by mouth daily.    . Dulaglutide (TRULICITY) 3 NI/6.2VO SOPN Inject 3 mg into the skin once a week. 12 pen 3  . fluticasone (FLONASE) 50 MCG/ACT nasal spray Place 1 spray into the nose daily. 16 g 0  . glimepiride (AMARYL) 4 MG tablet Take 1 tablet (4 mg total) by mouth 2 (two) times daily. (Patient not taking: Reported on 09/09/2020) 180 tablet 3  . glucose blood (ACCU-CHEK AVIVA PLUS) test strip Use to check blood sugar 3 times a day. 300 strip 12  . glucose blood (ACCU-CHEK GUIDE) test strip CHECK BLOOD SUGAR THREE TIMES DAILY 100 strip 0  . Insulin Glargine (BASAGLAR KWIKPEN) 100 UNIT/ML INJECT 32 UNITS SUBCUTANEOUSLY AT BEDTIME 30 mL 0  . Insulin Pen Needle (BD PEN NEEDLE NANO U/F) 32G X 4 MM MISC USE TO INJECT INSULIN 4 TIMES DAILY AS DIRECTED. 200 each 3  . Lancets (ONETOUCH ULTRASOFT) lancets Use as instructed to test 3 times daily 300 each 5  . Multiple Vitamin (MULTIVITAMIN) tablet Take 1 tablet by mouth daily.    Marland Kitchen  NOVOLOG FLEXPEN 100 UNIT/ML FlexPen INJECT 20 TO  30 UNITS SUBCUTANEOUSLY THREE TIMES DAILY WITH MEALS 90 mL 1  . simvastatin (ZOCOR) 40 MG tablet TAKE 1 TABLET EVERY DAY 90 tablet 2  . trazodone (DESYREL) 300 MG tablet Take 1 tablet (300 mg total) by mouth at bedtime. 90 tablet 1  .  venlafaxine XR (EFFEXOR-XR) 75 MG 24 hr capsule Take 1 capsule (75 mg total) by mouth daily. 90 capsule 1   No current facility-administered medications on file prior to visit.   Allergies  Allergen Reactions  . Metformin     REACTION: diarrhea  . Sitagliptin Phosphate     REACTION: abdominal pain   Family History  Problem Relation Age of Onset  . Deep vein thrombosis Mother   . COPD Father   . Asthma Father   . Diabetes Neg Hx        family hx  . Cancer Neg Hx        breast ca - sister(S)?  Marland Kitchen Heart disease Neg Hx        family hx   PE: BP 118/80 (BP Location: Right Arm, Patient Position: Sitting, Cuff Size: Normal)   Pulse 69   Ht _0  (1.6 m)   Wt 169 lb 3.2 oz (76.7 kg)   SpO2 96%   BMI 29.97 kg/m  Body mass index is 29.97 kg/m. Wt Readings from Last 3 Encounters:  09/11/20 169 lb 3.2 oz (76.7 kg)  09/09/20 170 lb (77.1 kg)  08/21/20 171 lb (77.6 kg)   Constitutional: overweight, in NAD Eyes: PERRLA, EOMI, no exophthalmos ENT: moist mucous membranes, no thyromegaly, no cervical lymphadenopathy Cardiovascular: RRR, No MRG Respiratory: CTA B Gastrointestinal: abdomen soft, NT, ND, BS+ Musculoskeletal: no deformities, strength intact in all 4 Skin: moist, warm, no rashes Neurological: no tremor with outstretched hands, DTR normal in all 4  ASSESSMENT: 1. DM2, insulin-dependent, uncontrolled, with complications -Severe hypoglycemia complicated by seizure - + nephropathy  We have tried to stop Amaryl, but sugars were higher >> we restarted it.  Now off since 09/13/2020.  2. HL  3.  Overweight  PLAN:  1. Patient with longstanding, uncontrolled, type 2 diabetes, on basal-bolus insulin regimen and weekly GLP-1 receptor agonist, returning after a longer absence, of 9 months.  She had improved control in the past after starting Trulicity.  However, she was still snacking between meals and having erratic meals at last visit and we discussed about the need for  consistency of her meals and also injecting NovoLog doses 15 minutes before meals and also a lower dose before snacks.  At that time, HbA1c was improved, at 7.3%. -Patient recently saw Dr. Elease Hashimoto and it was noted that she was not taking her medications consistently (per son's report) and also she has had no hypoglycemic episodes, seizure, and also falls.  The lowest blood sugar was 44. Dr. Elease Hashimoto stopped her glimepiride at that time. CGM interpretation: -At today's visit, we reviewed her CGM downloads: It appears that 37% of values are in target range (goal >70%), while 24% are higher than 180 (goal <25%), and 39% are lower than 70 (goal <4%).  The calculated average blood sugar is 118.  The projected HbA1c for the next 3 months (GMI) is 6.1%. -Reviewing the CGM trends, her sugars are dropping extremely low overnight from approximately 2 AM and remain under 70 until approximately 10 AM.  Upon questioning, they are telling me the patient is not eating much, but when she is  eating, she may eat junk food and I confirmed with the patient that she drinks sweet tea.  This is probably the reason of the significant increase in blood sugars after 10 AM.  At that time, she has an abrupt hyperglycemic excursion and the sugars are not improving only after 7 PM.  I strongly advised her to stop sweet tea. -In the last 2 days, sugars overnight have been above 70 so for now, I will advised him to hold [place].  I did advise him to let me know if the sugars start to increase, in which case, we can add Basaglar in the morning, but at a lower dose.  We will definitely continue off Amaryl. -Since the sugars increase precipitously after 10 AM, we discussed about keeping NovoLog but using much lower doses, instead of 30, only using 8 to 10 units and injected 15 minutes before the meal, not after the meals. -I will also send a prescription for glucagon to the pharmacy.  Daughter tells me that she does not have one at home.   I explained that if the help is not covered, to let me know, so I can send the injectable. - I advised her to:   Patient Instructions  Please continue: - Trulicity 3 mg weekly  Continue to hold Basaglar.  Continue off Amaryl.  Change: - NovoLog 8-10 units before meals  STOP SWEET TEA!!!  Please let me know how the sugars are doing in 1 week.  Please return in 1.5 months.   - we checked her HbA1c: 6.9% (lower) - advised to check sugars at different times of the day - 4x a day, rotating check times - advised for yearly eye exams >> she is UTD - return to clinic in 4 months    2. HL -Reviewed latest lipid panel from 04/2020: fractions at goal except triglycerides, which were elevated: Lab Results  Component Value Date   CHOL 162 04/08/2020   HDL 60 04/08/2020   LDLCALC 65 04/08/2020   LDLDIRECT 112.0 12/27/2018   TRIG 284 (H) 04/08/2020   CHOLHDL 2.7 04/08/2020  -Continue simvastatin 20 mg daily without side effects  3.  Overweight -We will continue the GLP-1 receptor agonist which should also help with weight loss -Weight is stable since last visit  - Total time spent for the visit: 40 min, in reviewing most recent medical information from the chart -especially related to her recent hypoglycemia event: reviewing her  previous labs, imaging evaluations, and treatments; also, discussion with Dr. Jarold Song about patient's treatment plan; reviewing her symptoms, counseling her and also her daughter and her son about her condition (please see the discussed topics above), and developing a plan to further treat it; they had a number of questions which I addressed.  Philemon Kingdom, MD PhD [facility] Endocr[facility]

## 2020-09-11 NOTE — Addendum Note (Signed)
Addended by: Philemon Kingdom on: 09/11/2020 10:57 AM   Modules accepted: Level of Service

## 2020-09-11 NOTE — Patient Instructions (Addendum)
Please continue: - Trulicity 3 mg weekly  Continue to hold Basaglar.  Continue off Amaryl.  Change: - NovoLog 8-10 units before meals  STOP SWEET TEA!!!  Please let me know how the sugars are doing in 1 week.  Please return in 1.5 months.

## 2020-09-13 ENCOUNTER — Other Ambulatory Visit: Payer: Medicare HMO

## 2020-09-14 ENCOUNTER — Telehealth: Payer: Self-pay | Admitting: Internal Medicine

## 2020-09-14 NOTE — Telephone Encounter (Signed)
T, it is a diabetic episode was actually a hypoglycemia event, she may need to reduce her NovoLog further.  As of now, I advised her to take 8 to 10 units, but she may need 6 to 8 units before meals.  But I agree, she definitely needs to improve her diet.

## 2020-09-14 NOTE — Telephone Encounter (Signed)
Pt's daughter called to let us know that Saturday night pt had another diabetic episode. The daughter just wanted Dr.Gherghe to know so she was updated for pt's next appt.   Pt's daughter also wants to note she is still having a hard time getting pt to eat and she thinks that is also playing a part.

## 2020-09-15 NOTE — Telephone Encounter (Signed)
Called and spoke with daughter and she explained they had to call emergency services. She explained her mom's not really eating after 4/5pm and its causing her sugars to drop when she goes to bed. They are trying to change her diet and eating schedule. I advised Dr's recommendation and to call if any further concerns

## 2020-09-22 ENCOUNTER — Encounter: Payer: Self-pay | Admitting: Adult Health

## 2020-09-28 ENCOUNTER — Other Ambulatory Visit: Payer: Self-pay | Admitting: Internal Medicine

## 2020-09-28 DIAGNOSIS — E1121 Type 2 diabetes mellitus with diabetic nephropathy: Secondary | ICD-10-CM

## 2020-09-28 DIAGNOSIS — Z794 Long term (current) use of insulin: Secondary | ICD-10-CM

## 2020-09-30 MED ORDER — FREESTYLE LIBRE 14 DAY SENSOR MISC: 3 refills | Status: AC

## 2020-10-02 DIAGNOSIS — 419620001 Death: Secondary | SNOMED CT | POA: Diagnosis not present

## 2020-10-02 DEATH — deceased

## 2020-10-03 DIAGNOSIS — E1121 Type 2 diabetes mellitus with diabetic nephropathy: Secondary | ICD-10-CM | POA: Diagnosis not present

## 2020-10-06 ENCOUNTER — Telehealth: Payer: Self-pay | Admitting: Adult Health

## 2020-10-06 NOTE — Telephone Encounter (Signed)
Form filled out and placed at front desk for pick up

## 2020-10-06 NOTE — Telephone Encounter (Signed)
Handicap Placard form to be filled out--placed in dr's folder.  Call [name] (Son) when complete [phone]

## 2020-10-06 NOTE — Telephone Encounter (Signed)
Spoke with the pts son and informed him the placard was left at the front desk for pick up.

## 2020-10-13 DIAGNOSIS — N184 Chronic kidney disease, stage 4 (severe): Secondary | ICD-10-CM | POA: Diagnosis not present

## 2020-10-13 DIAGNOSIS — E1122 Type 2 diabetes mellitus with diabetic chronic kidney disease: Secondary | ICD-10-CM | POA: Diagnosis not present

## 2020-10-13 DIAGNOSIS — I129 Hypertensive chronic kidney disease with stage 1 through stage 4 chronic kidney disease, or unspecified chronic kidney disease: Secondary | ICD-10-CM | POA: Diagnosis not present

## 2020-10-13 DIAGNOSIS — E875 Hyperkalemia: Secondary | ICD-10-CM | POA: Diagnosis not present

## 2020-10-23 ENCOUNTER — Encounter: Payer: Self-pay | Admitting: Internal Medicine

## 2020-10-23 ENCOUNTER — Ambulatory Visit (INDEPENDENT_AMBULATORY_CARE_PROVIDER_SITE_OTHER): Payer: Medicare HMO | Admitting: Internal Medicine

## 2020-10-23 ENCOUNTER — Other Ambulatory Visit: Payer: Self-pay

## 2020-10-23 VITALS — BP 120/78 | HR 93 | Ht 63.0 in | Wt 163.0 lb

## 2020-10-23 DIAGNOSIS — E663 Overweight: Secondary | ICD-10-CM

## 2020-10-23 DIAGNOSIS — E7800 Pure hypercholesterolemia, unspecified: Secondary | ICD-10-CM | POA: Diagnosis not present

## 2020-10-23 DIAGNOSIS — Z794 Long term (current) use of insulin: Secondary | ICD-10-CM | POA: Diagnosis not present

## 2020-10-23 DIAGNOSIS — E1121 Type 2 diabetes mellitus with diabetic nephropathy: Secondary | ICD-10-CM | POA: Diagnosis not present

## 2020-10-23 MED ORDER — INSULIN NPH (HUMAN) (ISOPHANE) 100 UNIT/ML ~~LOC~~ SUSP: 12.0000 [IU] | Freq: Every day | SUBCUTANEOUS | 3 refills | Status: DC

## 2020-10-23 NOTE — Progress Notes (Signed)
Patient ID: [name] [name], female   DOB: 1928-07-10, 87 y.o.   MRN: [id number]   This visit occurred during the SARS-CoV-2 public health emergency.  Safety protocols were in place, including screening questions prior to the visit, additional usage of staff PPE, and extensive cleaning of exam room while observing appropriate contact time as indicated for disinfecting solutions.   HPI: [name] [name] is a 87 y.o.-year-old female, returning for f/u for DM2, dx 2005, insulin-dependent, uncontrolled, with complications (nephropathy). Last visit 1.5 months ago.  She is accompanied by her daughter and the son also participates in the visit by phone -they offer most of the medical information as patient is mostly nonverbal. She has Brewing technologist.  Interim history: Before last visit, she had low blood sugar episodes, lowest being at 44, when she had a seizure.  She also had several falls.  She does have vascular dementia with behavioral disturbance with small subacute infarcts and chronic small vessel disease changes on MRI from 09/05/2020.  She does see neurology.   At last visit, she was off Amaryl and we also held [place] and decrease her NovoLog doses.  She continues on this dose is now, however, son is telling me that she actually gets approximately 1 dose of NovoLog a day as she is not taking it when she is by herself at home and even when they are together, they may forget to give her the dose until after she starts eating or after the meal. She did not have any more low blood sugar episodes since last visit. She did stop sweet drinks since last visit.  Now drinking diet Coke.  Reviewed HbA1c levels: Lab Results  Component Value Date   HGBA1C 6.9 (A) 09/11/2020   HGBA1C 7.3 (A) 12/12/2019   HGBA1C 7.4 (A) 08/15/2019   HGBA1C 7.3 (A) 04/15/2019   HGBA1C 7.2 11/15/2018   HGBA1C 6.6 (A) 07/16/2018   HGBA1C 6.6 (A) 03/15/2018   HGBA1C 7.2 (A) 12/12/2017   HGBA1C 8.4% 10/05/2017   HGBA1C  8.3 07/06/2017   Pt is on a regimen of: -   -held since 54/5625 - Trulicity 1.5 >> 3 mg weekly - NovoLog - actually getting approximately 1 dose a day  20-25 units before b'fast or lunch >> 30 >> 8-10 >> 6-8 units 15 minutes before a meal  26-28 units before dinner >> 30 units >> 8-10 >> 6-8 units 15 minutes before meal We had to stop Metformin 2/2 increased creatinine. Amaryl was stopped by PCP due to hypoglycemia 09/20/2020.  Pt.checks her sugars more than 4 times a day with her CGM.   Previously:   Lowest sugar was 99 >> 44 >> 175. She has hypoglycemia awareness in the 70s. Highest 285 >> 300s >> 435  + CKD stage III; last BUN/Cr: Lab Results  Component Value Date   BUN 23 08/21/2020   CREATININE 1.61 (H) 08/21/2020   + HL; last set of lipids: Lab Results  Component Value Date   CHOL 162 04/08/2020   HDL 60 04/08/2020   LDLCALC 65 04/08/2020   LDLDIRECT 112.0 12/27/2018   TRIG 284 (H) 04/08/2020   CHOLHDL 2.7 04/08/2020  On simvastatin 40.  Pt's last eye exam was: 04/10/2020: No DR - Dr. Susa Loffler at [facility]   She denies numbness and tingling in her feet  ROS: Constitutional: no weight gain/no weight loss, no fatigue, no subjective hyperthermia, no subjective hypothermia Eyes: no blurry vision, no xerophthalmia ENT: no sore throat,  no nodules palpated in neck, no dysphagia, no odynophagia, no hoarseness Cardiovascular: no CP/no SOB/no palpitations/no leg swelling Respiratory: no cough/no SOB/no wheezing Gastrointestinal: no N/no V/no D/no C/no acid reflux Musculoskeletal: no muscle aches/no joint aches Skin: no rashes, no hair loss Neurological: no tremors/no numbness/no tingling/no dizziness  I reviewed pt's medications, allergies, PMH, social hx, family hx, and changes were documented in the history of present illness. Otherwise, unchanged from my initial visit note.  Past Medical History:  Diagnosis Date  . Asthma   .  DEGENERATIVE JOINT DISEASE, GENERALIZED 02/01/2007   Qualifier: Diagnosis of  By: Burnice Logan  MD, Doretha Sou   . DIARRHEA, CHRONIC 08/20/2009   Qualifier: Diagnosis of  By: Burnice Logan  MD, Doretha Sou   . DM 02/01/2007   Qualifier: Diagnosis of  By: Floyde Parkins    . FATIGUE 10/20/2008   Qualifier: Diagnosis of  By: Burnice Logan  MD, Doretha Sou   . HYPERCHOLESTEROLEMIA 02/01/2007   Qualifier: Diagnosis of  By: Floyde Parkins    . HYPERLIPIDEMIA 12/20/2007   Qualifier: Diagnosis of  By: Burnice Logan  MD, Doretha Sou   . HYPERTENSION 02/01/2007   Qualifier: Diagnosis of  By: Floyde Parkins    . LOW BACK PAIN 01/13/2010   Qualifier: Diagnosis of  By: Burnice Logan  MD, Doretha Sou MUSCLE CRAMPS 04/06/2010   Qualifier: Diagnosis of  By: Burnice Logan  MD, Doretha Sou   . MYALGIA 01/20/2010   Qualifier: Diagnosis of  By: Elease Hashimoto MD, [place]    . OSTEOARTHRITIS 01/13/2010   Qualifier: Diagnosis of  By: Burnice Logan  MD, Doretha Sou   . OVERACTIVE BLADDER 08/20/2009   Qualifier: Diagnosis of  By: Burnice Logan  MD, Doretha Sou   . PLEURISY 06/18/2008   Qualifier: Diagnosis of  By: Niel Hummer MD, [place] PNEUMONIA 06/18/2008   Qualifier: Diagnosis of  By: Niel Hummer MD, [place] Rectal prolapse 09/20/2007   Qualifier: Diagnosis of  By: Burnice Logan  MD, Doretha Sou   . VITAMIN B12 DEFICIENCY 07/20/2010   Qualifier: Diagnosis of  By: Carlean Purl MD, Dimas Millin    Past Surgical History:  Procedure Laterality Date  . ABDOMINAL HYSTERECTOMY     partial- states she has her ovaries  . APPENDECTOMY    . BLADDER SURGERY    . CATARACT EXTRACTION Bilateral 2012  . CHOLECYSTECTOMY     Social History   Socioeconomic History  . Marital status: Single    Spouse name: Not on file  . Number of children: Not on file  . Years of education: Not on file  . Highest education level: Not on file  Occupational History  . Not on file  Tobacco Use  . Smoking status: Former Smoker    Packs/day: 10.00  . Smokeless tobacco: Never Used  . Tobacco  comment: quit > 18 yo   Vaping Use  . Vaping Use: Never used  Substance and Sexual Activity  . Alcohol use: No  . Drug use: No  . Sexual activity: Not on file  Other Topics Concern  . Not on file  Social History Narrative   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    Married for 40 years, widowed for 22 years    Right handed    Lives alone   Social Determinants of Health   Financial Resource Strain: Low Risk   . Difficulty of Paying Living Expenses: Not hard at all  Food Insecurity: No Food Insecurity  . Worried About Running  Out of Food in the Last Year: Never true  . Ran Out of Food in the Last Year: Never true  Transportation Needs: No Transportation Needs  . Lack of Transportation (Medical): No  . Lack of Transportation (Non-Medical): No  Physical Activity: Inactive  . Days of Exercise per Week: 0 days  . Minutes of Exercise per Session: 0 min  Stress: No Stress Concern Present  . Feeling of Stress : Not at all  Social Connections: Moderately Integrated  . Frequency of Communication with Friends and Family: More than three times a week  . Frequency of Social Gatherings with Friends and Family: More than three times a week  . Attends Religious Services: More than 4 times per year  . Active Member of Clubs or Organizations: Yes  . Attends Archivist Meetings: More than 4 times per year  . Marital Status: Widowed  Intimate Partner Violence: Not At Risk  . Fear of Current or Ex-Partner: No  . Emotionally Abused: No  . Physically Abused: No  . Sexually Abused: No   Current Outpatient Medications on File Prior to Visit  Medication Sig Dispense Refill  . Acetaminophen 650 MG TABS Take 1 tablet (650 mg total) by mouth 3 (three) times daily as needed. 30 tablet 0  . aspirin 81 MG tablet Take 81 mg by mouth daily.    . Blood Glucose Calibration (ACCU-CHEK GUIDE CONTROL) LIQD Use as directed 1 each 1  . Blood Glucose Monitoring Suppl (ACCU-CHEK AVIVA PLUS) w/Device KIT Use to check blood  sugar 3 times a day 1 kit 0  . Blood Glucose Monitoring Suppl (ACCU-CHEK GUIDE) w/Device KIT Use as directed 1 kit 0  . calcium-vitamin D (OSCAL WITH D 500-200) 500-200 MG-UNIT per tablet Take 1 tablet by mouth daily. (Patient not taking: Reported on 09/11/2020)    . Continuous Blood Gluc Sensor (FREESTYLE LIBRE 14 DAY SENSOR) MISC Use to check blood glucose 3 times a day 6 each 3  . cyanocobalamin 1000 MCG tablet Take 100 mcg by mouth daily.    . Dulaglutide (TRULICITY) 3 DS/2.8JG SOPN Inject 3 mg into the skin once a week. 12 pen 3  . fluticasone (FLONASE) 50 MCG/ACT nasal spray Place 1 spray into the nose daily. 16 g 0  . Glucagon 3 MG/DOSE POWD Place 3 mg into the nose once as needed for up to 1 dose. 1 each 11  . glucose blood (ACCU-CHEK AVIVA PLUS) test strip Use to check blood sugar 3 times a day. 300 strip 12  . glucose blood (ACCU-CHEK GUIDE) test strip CHECK BLOOD SUGAR THREE TIMES DAILY 100 strip 0  . Insulin Glargine (BASAGLAR KWIKPEN) 100 UNIT/ML INJECT 32 UNITS SUBCUTANEOUSLY AT BEDTIME 30 mL 0  . Insulin Pen Needle (BD PEN NEEDLE NANO U/F) 32G X 4 MM MISC USE TO INJECT INSULIN 4 TIMES DAILY AS DIRECTED. 200 each 3  . Lancets (ONETOUCH ULTRASOFT) lancets Use as instructed to test 3 times daily 300 each 5  . Multiple Vitamin (MULTIVITAMIN) tablet Take 1 tablet by mouth daily.    Marland Kitchen NOVOLOG FLEXPEN 100 UNIT/ML FlexPen INJECT 20 TO  30 UNITS SUBCUTANEOUSLY THREE TIMES DAILY WITH MEALS 90 mL 1  . simvastatin (ZOCOR) 40 MG tablet TAKE 1 TABLET EVERY DAY 90 tablet 2  . trazodone (DESYREL) 300 MG tablet Take 1 tablet (300 mg total) by mouth at bedtime. 90 tablet 1  . venlafaxine XR (EFFEXOR-XR) 75 MG 24 hr capsule Take 1 capsule (75 mg total) by  mouth daily. 90 capsule 1   No current facility-administered medications on file prior to visit.   Allergies  Allergen Reactions  . Metformin     REACTION: diarrhea  . Sitagliptin Phosphate     REACTION: abdominal pain   Family History   Problem Relation Age of Onset  . Deep vein thrombosis Mother   . COPD Father   . Asthma Father   . Diabetes Neg Hx        family hx  . Cancer Neg Hx        breast ca - sister(S)?  Marland Kitchen Heart disease Neg Hx        family hx   PE: BP 120/78 (BP Location: Right Arm, Patient Position: Sitting, Cuff Size: Normal)   Pulse 93   Ht 5' 3" (1.6 m)   Wt 163 lb (73.9 kg)   SpO2 99%   BMI 28.87 kg/m  Body mass index is 28.87 kg/m. Wt Readings from Last 3 Encounters:  10/23/20 163 lb (73.9 kg)  09/11/20 169 lb 3.2 oz (76.7 kg)  09/09/20 170 lb (77.1 kg)   Constitutional: overweight, in NAD Eyes: PERRLA, EOMI, no exophthalmos ENT: moist mucous membranes, no thyromegaly, no cervical lymphadenopathy Cardiovascular: RRR, No MRG Respiratory: CTA B Gastrointestinal: abdomen soft, NT, ND, BS+ Musculoskeletal: no deformities, strength intact in all 4 Skin: moist, warm, no rashes Neurological: no tremor with outstretched hands, DTR normal in all 4  ASSESSMENT: 1. DM2, insulin-dependent, uncontrolled, with complications -Severe hypoglycemia complicated by seizure - + nephropathy  We have tried to stop Amaryl, but sugars were higher >> we restarted it >> stopped again 09/13/2020.  2. HL  3.  Overweight  PLAN:  1. Patient with longstanding, uncontrolled, and GLP-1 receptor agonist at the previous visits, now off long-acting insulin after she retired at last visit immediately following several hypoglycemia episodes including one when sugars dropped to 44.  Dr. Jarold Song stopped her glimepiride at that time.  Of note, she had a seizure and has been having falls.  At that time we reviewed together the CGM download along with her son.  It appeared that her sugars were dropping extremely low overnight and they were increasing afterwards.  Upon questioning, she was eating junk food and drinking sweet tea.  I strongly advised her to stop both.  I advised him to hold Basaglar to avoid further low blood  sugars during the night.  We kept the NovoLog due to the increase in blood sugars during the day but we discussed about backing off the dose from 30 units to 8-10 units 15 minutes before each meal. I sent a prescription for glucagon to her pharmacy.  -Since last visit, we backed off to 6 to 8 units per meal.  However, at this visit, her son is telling me that she is only giving her insulin approximately once a day. CGM interpretation: -At today's visit, we reviewed her CGM downloads: It appears that only 1% of values are in target range (goal >70%), while 99% are higher than 180 (goal <25%), and 0% are lower than 70 (goal <4%).  The calculated average blood sugar is 276.  The projected HbA1c for the next 3 months (GMI) is 9.9%. -Reviewing the CGM trends, her sugars are almost all higher than 180.  They are wearing less overnight but still above target, and they are very fluctuating and much higher after the first meal of the day and remain high until after dinner.  This is a sign  of not enough mealtime insulin.  Upon questioning, son is only giving this to her once a day and I explained the importance of keeping it before every meal.  However, he is not home with her all the time and patient would not take the NovoLog consistently when alone despite having Alexa set to remind her to take her insulin.  Therefore, I suggested to add NPH insulin in the morning and I explained that this is a 12-hour insulin that will peak approximately mid afternoon and may cover her hyperglycemic excursions in the second half of the day much better.  I do not feel that she needs another dose at night since she is dropping her sugars quite significantly overnight.  I did advise them that she may need to titrated up depending on the blood sugars. -I also advised him to not give her the entire dose of NovoLog after a meal, if they forget to give her the dose before the meal.  I advised her to only use 50% of those in that  situation. - I advised her to:   Patient Instructions  Please start: - NPH 12 units in am (may increase if needed)  Use: - Novolog 8-10 units 15 min before the 3 meals  If she has to take it after a meal, give only 50% of the dose.  Please return in 3 months.   - advised to check sugars at different times of the day - 4x a day, rotating check times - advised for yearly eye exams >> she is UTD - return to clinic in 3 months    2. HL -Reviewed latest lipid panel from 04/2020: LDL much improved, at goal, triglycerides elevated: Lab Results  Component Value Date   CHOL 162 04/08/2020   HDL 60 04/08/2020   LDLCALC 65 04/08/2020   LDLDIRECT 112.0 12/27/2018   TRIG 284 (H) 04/08/2020   CHOLHDL 2.7 04/08/2020  -She continues on simvastatin 20 mg daily without side effects  3.  Overweight -We will continue the GLP-1 receptor agonist which should also help with weight loss -Weight was stable at last visit and lost 6 pounds since then.   Philemon Kingdom, MD PhD [facility] Endocrinology

## 2020-10-23 NOTE — Patient Instructions (Addendum)
Please start: - NPH 12 units in am (may increase if needed)  Use: - Novolog 8-10 units 15 min before the 3 meals  If she has to take it after a meal, give only 50% of the dose.  Please return in 3 months.

## 2020-11-19 ENCOUNTER — Other Ambulatory Visit: Payer: Self-pay | Admitting: *Deleted

## 2020-11-19 MED ORDER — INSULIN NPH (HUMAN) (ISOPHANE) 100 UNIT/ML ~~LOC~~ SUSP: 12.0000 [IU] | Freq: Every day | SUBCUTANEOUS | 3 refills | Status: DC

## 2020-11-26 ENCOUNTER — Telehealth: Payer: Self-pay | Admitting: Adult Health

## 2020-11-26 NOTE — Telephone Encounter (Signed)
Lauren is calling in to check the status to see if w/have received a form from [facility].  If we have received it they are wanting to know when it will be sent back.

## 2020-11-27 NOTE — Telephone Encounter (Signed)
Have you seen this ?

## 2020-11-27 NOTE — Telephone Encounter (Signed)
Noted  

## 2020-11-27 NOTE — Telephone Encounter (Signed)
Called Lauren and advised that we have not received the forms and to resend them.

## 2020-12-03 ENCOUNTER — Telehealth: Payer: Self-pay | Admitting: Adult Health

## 2020-12-03 NOTE — Telephone Encounter (Signed)
Patient's daughter dropped off [facility] forms  Fax forms to: [phone]  Disposition: Dr's Folder

## 2020-12-03 NOTE — Telephone Encounter (Signed)
Noted  

## 2020-12-04 DIAGNOSIS — Z0279 Encounter for issue of other medical certificate: Secondary | ICD-10-CM

## 2020-12-04 NOTE — Telephone Encounter (Signed)
Forms placed in your folder near office door. Please advise if pt will need an appt.

## 2020-12-08 ENCOUNTER — Telehealth: Payer: Self-pay | Admitting: *Deleted

## 2020-12-08 NOTE — Telephone Encounter (Signed)
Completed.

## 2020-12-08 NOTE — Chronic Care Management (AMB) (Signed)
  Chronic Care Management   Outreach Note  12/08/2020 Name: [name] [name] MRN: [id number] DOB: May 12, 1929  [name] [name] is a 87 y.o. year old female who is a primary care patient of [name] Peng, NP. I reached out to [name] [name] by phone today in response to a referral sent by [name] [name]'s PCP, [name] Peng, NP.     An unsuccessful telephone outreach was attempted today. The patient was referred to the case management team for assistance with care management and care coordination.   Follow Up Plan: A HIPAA compliant phone message was left for the patient providing contact information and requesting a return call.  The care management team will reach out to the patient again over the next 7 days. If patient returns call to provider office, please advise to call [name] [name] at [phone].  [facility]

## 2020-12-18 NOTE — Chronic Care Management (AMB) (Signed)
Chronic Care Management   Note  12/18/2020 Name: MADILEE GRONAU MRN: [id number] DOB: January 20, 1929  BRYONA HOLIFIELD is a 87 y.o. year old female who is a primary care patient of Shirline Frees, NP. I reached out to Jannette Fogo by phone today in response to a referral sent by Ms. Freeman Caldron King's PCP, Shirline Frees, NP      Ms. Rondinelli was given information about Chronic Care Management services today including:  CCM service includes personalized support from designated clinical staff supervised by her physician, including individualized plan of care and coordination with other care providers 24/7 contact phone numbers for assistance for urgent and routine care needs. Service will only be billed when office clinical staff spend 20 minutes or more in a month to coordinate care. Only one practitioner may furnish and bill the service in a calendar month. The patient may stop CCM services at any time (effective at the end of the month) by phone call to the office staff. The patient will be responsible for cost sharing (co-pay) of up to 20% of the service fee (after annual deductible is met).  Patient agreed to services and verbal consent obtained.   Follow up plan: Telephone appointment with care management team member scheduled for:01/05/2021  [facility] Guide, Embedded Care Coordination [facility] Management

## 2020-12-22 DIAGNOSIS — E1121 Type 2 diabetes mellitus with diabetic nephropathy: Secondary | ICD-10-CM | POA: Diagnosis not present

## 2020-12-25 ENCOUNTER — Ambulatory Visit (INDEPENDENT_AMBULATORY_CARE_PROVIDER_SITE_OTHER): Payer: Medicare HMO | Admitting: Internal Medicine

## 2020-12-25 ENCOUNTER — Encounter: Payer: Self-pay | Admitting: Internal Medicine

## 2020-12-25 ENCOUNTER — Other Ambulatory Visit: Payer: Self-pay

## 2020-12-25 VITALS — BP 140/64 | HR 91 | Ht 63.0 in | Wt 160.0 lb

## 2020-12-25 DIAGNOSIS — E1121 Type 2 diabetes mellitus with diabetic nephropathy: Secondary | ICD-10-CM | POA: Diagnosis not present

## 2020-12-25 DIAGNOSIS — E663 Overweight: Secondary | ICD-10-CM

## 2020-12-25 DIAGNOSIS — Z794 Long term (current) use of insulin: Secondary | ICD-10-CM

## 2020-12-25 DIAGNOSIS — E7800 Pure hypercholesterolemia, unspecified: Secondary | ICD-10-CM | POA: Diagnosis not present

## 2020-12-25 LAB — POCT GLYCOSYLATED HEMOGLOBIN (HGB A1C): Hemoglobin A1C: 10.4 % — AB (ref 4.0–5.6)

## 2020-12-25 MED ORDER — TRULICITY 3 MG/0.5ML ~~LOC~~ SOAJ: 3.0000 mg | SUBCUTANEOUS | 3 refills | Status: DC

## 2020-12-25 MED ORDER — INSULIN NPH (HUMAN) (ISOPHANE) 100 UNIT/ML ~~LOC~~ SUSP: 24.0000 [IU] | Freq: Every day | SUBCUTANEOUS | 3 refills | Status: DC

## 2020-12-25 NOTE — Patient Instructions (Addendum)
Please continue: - Trulicity 3 mg weekly  Please increase: - NPH 20 units in am for the next 4 days, then increase by 4 units every 4 days if sugars remain high - Novolog 10-12 units 15 min before the 3 meals  If you have to take it after a meal, take only 50% of the dose, but this should be rare.  Please return in 3 months.

## 2020-12-25 NOTE — Progress Notes (Signed)
Patient ID: [name] [name], female   DOB: 1928/12/06, 87 y.o.   MRN: [id number]   This visit occurred during the SARS-CoV-2 public health emergency.  Safety protocols were in place, including screening questions prior to the visit, additional usage of staff PPE, and extensive cleaning of exam room while observing appropriate contact time as indicated for disinfecting solutions.   HPI: [name] [name] is a 87 y.o.-year-old female, returning for f/u for DM2, dx 2005, insulin-dependent, uncontrolled, with complications (nephropathy). Last visit 2 months ago.  She is accompanied by her daughter and the son also participates in the visit by phone -they offer most of the medical information as patient is mostly nonverbal. She has Brewing technologist.  Interim history: Last year, she had low blood sugar episodes, lowest being at 44, when she had a seizure.  She also had several falls.  She does have vascular dementia with behavioral disturbance with small subacute infarcts and chronic small vessel disease changes on MRI from 09/05/2020.  She does see neurology.   At last visit, her son was telling them that she was actually getting approximately 1 dose of NovoLog a day as she was forgetting it when she was by herself, and even in her family was with her forgetting to give her the insulin until after the end of the meal. No low blood sugars episodes before last visit and since then.  However, the sugars increase significantly since last visit.  Reviewed HbA1c levels: Lab Results  Component Value Date   HGBA1C 6.9 (A) 09/11/2020   HGBA1C 7.3 (A) 12/12/2019   HGBA1C 7.4 (A) 08/15/2019   HGBA1C 7.3 (A) 04/15/2019   HGBA1C 7.2 11/15/2018   HGBA1C 6.6 (A) 07/16/2018   HGBA1C 6.6 (A) 03/15/2018   HGBA1C 7.2 (A) 12/12/2017   HGBA1C 8.4% 10/05/2017   HGBA1C 8.3 07/06/2017   Pt is on a regimen of: -  >> off 03-10/2020 >> NPH 12 units in am - started 10/2020 -patient's son did not increase the dose  further - Trulicity 1.5 >> 3 mg weekly - NovoLog 20-25 units before b'fast or lunch >> 30 >> 8-10 >> 6-8 units 15 minutes before a meal 26-28 units before dinner >> 30 units >> 8-10 >> 6-8 units 15 minutes before meal We had to stop Metformin 2/2 increased creatinine. Amaryl was stopped by PCP due to hypoglycemia 10/01/2020.  Pt.checks her sugars more than 4 times a day with her CGM:   Previously:   Previously:   Lowest sugar was 99 >> 44 >> 175 >> 160. She has hypoglycemia awareness in the 70s. Highest 285 >> 300s >> 435 >> 400s.  + CKD stage III; last BUN/Cr: Lab Results  Component Value Date   BUN 23 08/21/2020   CREATININE 1.61 (H) 08/21/2020   + HL; last set of lipids: Lab Results  Component Value Date   CHOL 162 04/08/2020   HDL 60 04/08/2020   LDLCALC 65 04/08/2020   LDLDIRECT 112.0 12/27/2018   TRIG 284 (H) 04/08/2020   CHOLHDL 2.7 04/08/2020  On simvastatin 40.  Pt's last eye exam was: 04/10/2020: No DR - Dr. Susa Loffler at [facility].   She denies numbness and tingling in her feet  ROS: Constitutional: no weight gain/no weight loss, no fatigue, no subjective hyperthermia, no subjective hypothermia Eyes: no blurry vision, no xerophthalmia ENT: no sore throat, no nodules palpated in neck, no dysphagia, no odynophagia, no hoarseness Cardiovascular: no CP/no SOB/no palpitations/no leg swelling Respiratory: no  cough/no SOB/no wheezing Gastrointestinal: no N/no V/no D/no C/no acid reflux Musculoskeletal: no muscle aches/no joint aches Skin: no rashes, no hair loss Neurological: no tremors/no numbness/no tingling/no dizziness  I reviewed pt's medications, allergies, PMH, social hx, family hx, and changes were documented in the history of present illness. Otherwise, unchanged from my initial visit note.  Past Medical History:  Diagnosis Date   Asthma    DEGENERATIVE JOINT DISEASE, GENERALIZED 02/01/2007   Qualifier: Diagnosis of   By: Burnice Logan  MD, Doretha Sou    DIARRHEA, CHRONIC 08/20/2009   Qualifier: Diagnosis of  By: Burnice Logan  MD, Doretha Sou    DM 02/01/2007   Qualifier: Diagnosis of  By: Floyde Parkins     FATIGUE 10/20/2008   Qualifier: Diagnosis of  By: Burnice Logan  MD, Doretha Sou    HYPERCHOLESTEROLEMIA 02/01/2007   Qualifier: Diagnosis of  By: Mariam Dollar 12/20/2007   Qualifier: Diagnosis of  By: Burnice Logan  MD, Doretha Sou    HYPERTENSION 02/01/2007   Qualifier: Diagnosis of  By: Russella Dar BACK PAIN 01/13/2010   Qualifier: Diagnosis of  By: Burnice Logan  MD, Doretha Sou    MUSCLE CRAMPS 04/06/2010   Qualifier: Diagnosis of  By: Burnice Logan  MD, Doretha Sou    MYALGIA 01/20/2010   Qualifier: Diagnosis of  By: Elease Hashimoto MD, Bruce     OSTEOARTHRITIS 01/13/2010   Qualifier: Diagnosis of  By: Burnice Logan  MD, Doretha Sou    OVERACTIVE BLADDER 08/20/2009   Qualifier: Diagnosis of  By: Burnice Logan  MD, Doretha Sou    PLEURISY 06/18/2008   Qualifier: Diagnosis of  By: Niel Hummer MD, Lorinda Creed    PNEUMONIA 06/18/2008   Qualifier: Diagnosis of  By: Niel Hummer MD, Willie R    Rectal prolapse 09/20/2007   Qualifier: Diagnosis of  By: Burnice Logan  MD, Doretha Sou    VITAMIN B12 DEFICIENCY 07/20/2010   Qualifier: Diagnosis of  By: Carlean Purl MD, Dimas Millin    Past Surgical History:  Procedure Laterality Date   ABDOMINAL HYSTERECTOMY     partial- states she has her ovaries   APPENDECTOMY     BLADDER SURGERY     CATARACT EXTRACTION Bilateral 2012   CHOLECYSTECTOMY     Social History   Socioeconomic History   Marital status: Single    Spouse name: Not on file   Number of children: Not on file   Years of education: Not on file   Highest education level: Not on file  Occupational History   Not on file  Tobacco Use   Smoking status: Former    Packs/day: 10.00    Pack years: 0.00    Types: Cigarettes   Smokeless tobacco: Never   Tobacco comments:    quit > 39 yo   Vaping Use   Vaping Use: Never used  Substance  and Sexual Activity   Alcohol use: No   Drug use: No   Sexual activity: Not on file  Other Topics Concern   Not on file  Social History Narrative   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    Married for 41 years, widowed for 22 years    Right handed    Lives alone   Social Determinants of Health   Financial Resource Strain: Low Risk    Difficulty of Paying Living Expenses: Not hard at all  Food Insecurity: No Food Insecurity   Worried About [place] in the Last Year: Never  true   Ran Out of Food in the Last Year: Never true  Transportation Needs: No Transportation Needs   Lack of Transportation (Medical): No   Lack of Transportation (Non-Medical): No  Physical Activity: Inactive   Days of Exercise per Week: 0 days   Minutes of Exercise per Session: 0 min  Stress: No Stress Concern Present   Feeling of Stress : Not at all  Social Connections: Moderately Integrated   Frequency of Communication with Friends and Family: More than three times a week   Frequency of Social Gatherings with Friends and Family: More than three times a week   Attends Religious Services: More than 4 times per year   Active Member of [employer] or Organizations: Yes   Attends Archivist Meetings: More than 4 times per year   Marital Status: Widowed  Human resources officer Violence: Not At Risk   Fear of Current or Ex-Partner: No   Emotionally Abused: No   Physically Abused: No   Sexually Abused: No   Current Outpatient Medications on File Prior to Visit  Medication Sig Dispense Refill   Acetaminophen 650 MG TABS Take 1 tablet (650 mg total) by mouth 3 (three) times daily as needed. 30 tablet 0   aspirin 81 MG tablet Take 81 mg by mouth daily.     Blood Glucose Calibration (ACCU-CHEK GUIDE CONTROL) LIQD Use as directed 1 each 1   Blood Glucose Monitoring Suppl (ACCU-CHEK AVIVA PLUS) w/Device KIT Use to check blood sugar 3 times a day 1 kit 0   Blood Glucose Monitoring Suppl (ACCU-CHEK GUIDE) w/Device KIT Use as  directed 1 kit 0   calcium-vitamin D (OSCAL WITH D 500-200) 500-200 MG-UNIT per tablet Take 1 tablet by mouth daily. (Patient not taking: Reported on 09/11/2020)     Continuous Blood Gluc Sensor (FREESTYLE LIBRE 14 DAY SENSOR) MISC Use to check blood glucose 3 times a day 6 each 3   Dulaglutide (TRULICITY) 3 ZS/0.1UX SOPN Inject 3 mg into the skin once a week. 12 pen 3   fluticasone (FLONASE) 50 MCG/ACT nasal spray Place 1 spray into the nose daily. 16 g 0   Glucagon 3 MG/DOSE POWD Place 3 mg into the nose once as needed for up to 1 dose. 1 each 11   glucose blood (ACCU-CHEK AVIVA PLUS) test strip Use to check blood sugar 3 times a day. 300 strip 12   glucose blood (ACCU-CHEK GUIDE) test strip CHECK BLOOD SUGAR THREE TIMES DAILY 100 strip 0   Insulin Glargine (BASAGLAR KWIKPEN) 100 UNIT/ML INJECT 32 UNITS SUBCUTANEOUSLY AT BEDTIME 30 mL 0   insulin NPH Human (NOVOLIN N) 100 UNIT/ML injection Inject 0.12 mLs (12 Units total) into the skin daily before breakfast. Pens please 15 mL 3   Insulin Pen Needle (BD PEN NEEDLE NANO U/F) 32G X 4 MM MISC USE TO INJECT INSULIN 4 TIMES DAILY AS DIRECTED. 200 each 3   Lancets (ONETOUCH ULTRASOFT) lancets Use as instructed to test 3 times daily 300 each 5   Multiple Vitamin (MULTIVITAMIN) tablet Take 1 tablet by mouth daily.     NOVOLOG FLEXPEN 100 UNIT/ML FlexPen INJECT 20 TO  30 UNITS SUBCUTANEOUSLY THREE TIMES DAILY WITH MEALS 90 mL 1   simvastatin (ZOCOR) 40 MG tablet TAKE 1 TABLET EVERY DAY 90 tablet 2   trazodone (DESYREL) 300 MG tablet Take 1 tablet (300 mg total) by mouth at bedtime. 90 tablet 1   venlafaxine XR (EFFEXOR-XR) 75 MG 24 hr capsule Take 1  capsule (75 mg total) by mouth daily. 90 capsule 1   No current facility-administered medications on file prior to visit.   Allergies  Allergen Reactions   Metformin     REACTION: diarrhea   Sitagliptin Phosphate     REACTION: abdominal pain   Family History  Problem Relation Age of Onset   Deep vein  thrombosis Mother    COPD Father    Asthma Father    Diabetes Neg Hx        family hx   Cancer Neg Hx        breast ca - sister(S)?   Heart disease Neg Hx        family hx   PE: BP 140/64 (BP Location: Right Arm, Patient Position: Sitting, Cuff Size: Normal)   Pulse 91   Ht 5' 3"  (1.6 m)   Wt 160 lb (72.6 kg)   SpO2 95%   BMI 28.34 kg/m  Body mass index is 28.34 kg/m. Wt Readings from Last 3 Encounters:  12/25/20 160 lb (72.6 kg)  10/23/20 163 lb (73.9 kg)  09/11/20 169 lb 3.2 oz (76.7 kg)   Constitutional: overweight, in NAD Eyes: PERRLA, EOMI, no exophthalmos ENT: moist mucous membranes, no thyromegaly, no cervical lymphadenopathy Cardiovascular: RRR, No MRG Respiratory: CTA B Gastrointestinal: abdomen soft, NT, ND, BS+ Musculoskeletal: no deformities, strength intact in all 4 Skin: moist, warm, no rashes Neurological: no tremor with outstretched hands, DTR normal in all 4  ASSESSMENT: 1. DM2, insulin-dependent, uncontrolled, with complications -Severe hypoglycemia complicated by seizure - + nephropathy  We have tried to stop Amaryl, but sugars were higher >> we restarted it >> stopped again 09/04/2020.  2. HL  3.  Overweight  PLAN:  1. Patient with longstanding, uncontrolled, diabetes, on basal-bolus insulin regimen and weekly GLP-1 receptor agonist with history of several hypoglycemia episodes in the past and also falls and seizures.  Dr. Elease Hashimoto stopped her glimepiride at that time.  She was eating junk food and drinking sweet tea we discussed about stopping both.  We also held Luce for a period of time but at last visit we started intermediate acting insulin low-dose.  I also advised him to increase the NovoLog slightly to 8 to 10 units 15 minutes before each meal.  At that time the son was telling me that she actually was only taking NovoLog once a day as she was forgetting to take it.  The family was also giving her injections occasionally after the meals and  I advised her that if she needs to get the injection after the meal, to decrease the dose to only 50%.   CGM interpretation: -At today's visit, we reviewed her CGM downloads: It appears that 1% of values are in target range (goal >70%), while 99% are higher than 180 (goal <25%), and 0% are lower than 70 (goal <4%).  The calculated average blood sugar is 283.  The projected HbA1c for the next 3 months (GMI) is 10.1%. -Reviewing the CGM trends, all her sugars are higher than target, with slightly lower values overnight, decreasing after dinner, but with increasing blood sugars immediately after she wakes up.  The highest blood sugars are after lunch and after dinner.  Upon questioning, patient's son continues to give her 12 units of NPH, rather than increasing the dose as I advised him at last visit.  At this visit, we will increase the dose to 20 units and advised him how to titrate the dose.  Also, he is  giving a lower dose of NovoLog and many times he is giving this after meals.  I advised him again about the importance of taking it 15 minutes before each meal.  We will increase the dose for now to 10-12 units before each of the 3 meals.  He had several Trulicity doses missed since last visit and we discussed about setting Alexa to remind the patient about taking this and also the insulin doses. - I advised her to:   Patient Instructions  Please continue: - Trulicity 3 mg weekly  Please increase: - NPH 20 units in am for the next 4 days, then increase by 4 units every 4 days if sugars remain high - Novolog 10-12 units 15 min before the 3 meals  If you have to take it after a meal, take only 50% of the dose, but this should be rare.  Please return in 3 months.     - we checked her HbA1c: 10.4% (MUCH higher) - advised to check sugars at different times of the day - 4x a day, rotating check times - advised for yearly eye exams >> she is UTD - return to clinic in 3 months    2. HL -Reviewed  latest lipid panel from 04/2020: LDL at goal, triglycerides elevated. Lab Results  Component Value Date   CHOL 162 04/08/2020   HDL 60 04/08/2020   LDLCALC 65 04/08/2020   LDLDIRECT 112.0 12/27/2018   TRIG 284 (H) 04/08/2020   CHOLHDL 2.7 04/08/2020  -She continues on simvastatin 8m daily without side effects  3.  Overweight -We will continue the GLP-1 receptor agonist which should also help with weight loss -She lost 6 pounds before last visit   CPhilemon Kingdom MD PhD [facility]

## 2021-01-05 ENCOUNTER — Ambulatory Visit (INDEPENDENT_AMBULATORY_CARE_PROVIDER_SITE_OTHER): Payer: Medicare HMO

## 2021-01-05 DIAGNOSIS — I10 Essential (primary) hypertension: Secondary | ICD-10-CM

## 2021-01-05 DIAGNOSIS — E7800 Pure hypercholesterolemia, unspecified: Secondary | ICD-10-CM | POA: Diagnosis not present

## 2021-01-05 DIAGNOSIS — E1121 Type 2 diabetes mellitus with diabetic nephropathy: Secondary | ICD-10-CM

## 2021-01-05 DIAGNOSIS — Z794 Long term (current) use of insulin: Secondary | ICD-10-CM

## 2021-01-05 DIAGNOSIS — R4189 Other symptoms and signs involving cognitive functions and awareness: Secondary | ICD-10-CM

## 2021-01-05 NOTE — Chronic Care Management (AMB) (Signed)
Chronic Care Management   CCM RN Visit Note  01/05/2021 Name: [name] [name] MRN: [id number] DOB: [birth year]  Subjective: [name] [name] is a 87 y.o. year old female who is a primary care patient of Dorothyann Peng, NP. The care management team was consulted for assistance with disease management and care coordination needs.    Engaged with patient by telephone for initial visit in response to provider referral for case management and/or care coordination services.   Consent to Services:  The patient was given the following information about Chronic Care Management services today, agreed to services, and gave verbal consent: 1. CCM service includes personalized support from designated clinical staff supervised by the primary care provider, including individualized plan of care and coordination with other care providers 2. 24/7 contact phone numbers for assistance for urgent and routine care needs. 3. Service will only be billed when office clinical staff spend 20 minutes or more in a month to coordinate care. 4. Only one practitioner may furnish and bill the service in a calendar month. 5.The patient may stop CCM services at any time (effective at the end of the month) by phone call to the office staff. 6. The patient will be responsible for cost sharing (co-pay) of up to 20% of the service fee (after annual deductible is met). Patient agreed to services and consent obtained.  Patient agreed to services and verbal consent obtained.   Assessment: Review of patient past medical history, allergies, medications, health status, including review of consultants reports, laboratory and other test data, was performed as part of comprehensive evaluation and provision of chronic care management services.   SDOH (Social Determinants of Health) assessments and interventions performed:  SDOH Interventions    Flowsheet Row Most Recent Value  SDOH Interventions   Food Insecurity Interventions  Intervention Not Indicated  Transportation Interventions Intervention Not Indicated        CCM Care Plan  Allergies  Allergen Reactions   Metformin     REACTION: diarrhea   Sitagliptin Phosphate     REACTION: abdominal pain    Outpatient Encounter Medications as of 01/05/2021  Medication Sig   Acetaminophen 650 MG TABS Take 1 tablet (650 mg total) by mouth 3 (three) times daily as needed.   aspirin 81 MG tablet Take 81 mg by mouth daily.   Blood Glucose Calibration (ACCU-CHEK GUIDE CONTROL) LIQD Use as directed   Blood Glucose Monitoring Suppl (ACCU-CHEK AVIVA PLUS) w/Device KIT Use to check blood sugar 3 times a day   Blood Glucose Monitoring Suppl (ACCU-CHEK GUIDE) w/Device KIT Use as directed   calcium-vitamin D (OSCAL WITH D 500-200) 500-200 MG-UNIT per tablet Take 1 tablet by mouth daily.   Continuous Blood Gluc Sensor (FREESTYLE LIBRE 14 DAY SENSOR) MISC Use to check blood glucose 3 times a day   Dulaglutide (TRULICITY) 3 JT/7.0VX SOPN Inject 3 mg into the skin once a week.   fluticasone (FLONASE) 50 MCG/ACT nasal spray Place 1 spray into the nose daily.   Glucagon 3 MG/DOSE POWD Place 3 mg into the nose once as needed for up to 1 dose.   glucose blood (ACCU-CHEK AVIVA PLUS) test strip Use to check blood sugar 3 times a day.   glucose blood (ACCU-CHEK GUIDE) test strip CHECK BLOOD SUGAR THREE TIMES DAILY   insulin NPH Human (NOVOLIN N) 100 UNIT/ML injection Inject 0.24 mLs (24 Units total) into the skin daily before breakfast. Pens please   Insulin Pen Needle (BD PEN NEEDLE NANO U/F) 32G  X 4 MM MISC USE TO INJECT INSULIN 4 TIMES DAILY AS DIRECTED.   Lancets (ONETOUCH ULTRASOFT) lancets Use as instructed to test 3 times daily   Multiple Vitamin (MULTIVITAMIN) tablet Take 1 tablet by mouth daily.   NOVOLOG FLEXPEN 100 UNIT/ML FlexPen INJECT 20 TO  30 UNITS SUBCUTANEOUSLY THREE TIMES DAILY WITH MEALS   simvastatin (ZOCOR) 40 MG tablet TAKE 1 TABLET EVERY DAY   trazodone (DESYREL)  300 MG tablet Take 1 tablet (300 mg total) by mouth at bedtime.   venlafaxine XR (EFFEXOR-XR) 75 MG 24 hr capsule Take 1 capsule (75 mg total) by mouth daily.   No facility-administered encounter medications on file as of 01/05/2021.    Patient Active Problem List   Diagnosis Date Noted   Overweight (BMI 25.0-29.9) 03/15/2018   Type 2 diabetes mellitus with diabetic nephropathy, with long-term current use of insulin ([place]) 06/17/2014   VITAMIN B12 DEFICIENCY 07/20/2010   LOW BACK PAIN 01/13/2010   OVERACTIVE BLADDER 08/20/2009   DIARRHEA, CHRONIC 08/20/2009   Fatigue 10/20/2008   RECTAL PROLAPSE 09/20/2007   HYPERCHOLESTEROLEMIA 02/01/2007   Essential hypertension 02/01/2007   Osteoarthritis 02/01/2007    Conditions to be addressed/monitored:HTN, HLD, DMII, and Dementia  Care Plan : RNCM:Dementia (Adult)  Updates made by Dimitri Ped, RN since 01/05/2021 12:00 AM     Problem: Harm or Injury   Priority: Medium     Long-Range Goal: Harm or Injury Prevented due to impaired memory   Start Date: 01/05/2021  Expected End Date: 06/03/2021  This Visit's Progress: On track  Priority: Medium  Note:   Current Barriers:  Knowledge Deficits related to fall precautions in patient with dementia, type 2 DM, HTN, HLD  Decreased adherence to prescribed treatment for fall prevention Unable to independently self manage impaired memory Unable to self administer medications as prescribed Does not adhere to provider recommendations re:  Unable to perform IADLs independently Knowledge Deficits related to management of memory impairment and caregiver stress Non-adherence to prescribed medication regimen Cognitive Deficits Spoke with pt and daughter Susette Racer [employer].  Daughter states that pt has become on forgetful this last year especially after she had her falls.  States that she and her brothers have been helping pt but they do not live with her.  States pt has not been  taking her medications regularly.  States her brother fills her medication box but she still forgets to take them.  States pt has a cane but she does not aways use it.   Clinical Goal(s):  patient will demonstrate improved adherence to prescribed treatment plan for decreasing falls as evidenced by patient reporting and review of EMR patient will verbalize using fall risk reduction strategies discussed patient will not experience additional falls patient will verbalize understanding of plan for self management of dementia and fall safety patient will take all medications exactly as prescribed and will call provider for medication related questions patient will attend all scheduled medical appointments: Dr. Cruzita Lederer 04/02/21, Dr, Delice Lesch 05/24/21 patient will work with CM clinical social worker to help with caregiver stress and possible need for higher level of care in the future the patient will demonstrate ongoing self health care management ability Interventions:  Collaboration with Dorothyann Peng, NP regarding development and update of comprehensive plan of care as evidenced by provider attestation and co-signature Inter-disciplinary care team collaboration (see longitudinal plan of care) Provided written and verbal education re: Potential causes of falls and Fall prevention strategies Reviewed medications and discussed  potential side effects of medications such as dizziness and frequent urination Assessed for s/s of orthostatic hypotension Assessed for falls since last encounter. Assessed patients knowledge of fall risk prevention secondary to previously provided education. Assessed working status of life alert bracelet and patient adherence Provided patient information for fall alert systems Evaluation of current treatment plan related to  management of dementia and fall prevention  and patient's adherence to plan as established by provider. Reviewed scheduled/upcoming provider appointments  including:  Social Work referral for caregiver stress, respite and possible need for higher level of care in the future Pharmacy referral for declined at this time by caregiver Self-Care Deficits:  Unable to self administer medications as prescribed Unable to perform IADLs independently Patient Goals:  - Utilize cane (assistive device) appropriately with all ambulation - De-clutter walkways - Change positions slowly - Wear secure fitting shoes at all times with ambulation - Utilize home lighting for dim lit areas - Demonstrate self and pet awareness at all times Follow Up Plan: Telephone follow up appointment with care management team member scheduled for: 02/01/21 at 11:30 AM The patient has been provided with contact information for the care management team and has been advised to call with any health related questions or concerns.      Care Plan : RNCM:Diabetes Type 2 (Adult)  Updates made by Dimitri Ped, RN since 01/05/2021 12:00 AM     Problem: Lack of long term self mangement of Type 2 Diabetes   Priority: High     Long-Range Goal: Effective self management of Type 2 Diabetes   Start Date: 01/05/2021  Expected End Date: 06/03/2021  This Visit's Progress: On track  Priority: High  Note:   Objective:  Lab Results  Component Value Date   HGBA1C 10.4 (A) 12/25/2020   Lab Results  Component Value Date   CREATININE 1.61 (H) 08/21/2020   CREATININE 1.81 (H) 04/08/2020   CREATININE 1.74 (H) 11/13/2019   No results found for: EGFR Current Barriers:  Knowledge Deficits related to basic Diabetes pathophysiology and self care/management Knowledge Deficits related to medications used for management of diabetes Cognitive Deficits Unable to independently self manage type 2 diabetes with hx of dementia, HTN and HLD Unable to self administer medications as prescribed Unable to perform IADLs independently Daughter states that her brother gives pt her morning insulin but pt  forgets to take her mealtime insulin unless reminded.  States she has the [employer] and her sugars range from 150-350 depending on the time of day.  Denies any recent low blood sugars.  States pt likes to eat sweets but she has cut back on drinking sweet tea to about every other day.  States pt has an Alexa that reminds her to take her medications but she does not listen to her directions Case Manager Clinical Goal(s):  patient will demonstrate improved adherence to prescribed treatment plan for diabetes self care/management as evidenced by: daily monitoring and recording of CBG  adherence to ADA/ carb modified diet adherence to prescribed medication regimen contacting provider for new or worsened symptoms or questions Interventions:  Collaboration with Dorothyann Peng, NP regarding development and update of comprehensive plan of care as evidenced by provider attestation and co-signature Inter-disciplinary care team collaboration (see longitudinal plan of care) Provided education to patient about basic DM disease process Reviewed medications with patient and discussed importance of medication adherence Discussed plans with patient for ongoing care management follow up and provided patient with direct contact information for care  management team Provided patient with written educational materials related to hypo and hyperglycemia and importance of correct treatment Reviewed scheduled/upcoming provider appointments including: Dr. Cruzita Lederer 04/02/21 Dr Delice Lesch 05/24/21 Advised patient, providing education and rationale, to check cbg 4 times and record, calling provider for findings outside established parameters.   Referral made to pharmacy team for assistance with caregiver declines at this time Referral made to social work team for assistance with caregiver stress, respite care and possible need for placement in the future Review of patient status, including review of consultants reports, relevant  laboratory and other test results, and medications completed. Encouraged to cut back on the sweet tea Self-Care Activities - Self administers insulin as prescribed Self administers injectable DM medication (Trulicity) as prescribed Attends all scheduled provider appointments Checks blood sugars as prescribed and utilize hyper and hypoglycemia protocol as needed Adheres to prescribed ADA/carb modified Patient Goals: - check blood sugar at prescribed times - check blood sugar if I feel it is too high or too low - enter blood sugar readings and medication or insulin into daily log - take the blood sugar log to all doctor visits - take the blood sugar meter to all doctor visits - drink 6 to 8 glasses of water each day - limit fast food meals to no more than 1 per week - switch to sugar-free drinks - keep appointment with eye doctor - check feet daily for cuts, sores or redness Follow Up Plan: Telephone follow up appointment with care management team member scheduled for: 02/01/21 at 11:30 AM The patient has been provided with contact information for the care management team and has been advised to call with any health related questions or concerns.       Plan:Telephone follow up appointment with care management team member scheduled for:  02/01/21 and The patient has been provided with contact information for the care management team and has been advised to call with any health related questions or concerns.  Peter Garter RN, Jackquline Denmark, CDE Care Management Coordinator [facility] [phone], Mobile [phone]

## 2021-01-05 NOTE — Patient Instructions (Signed)
Visit Information   PATIENT GOALS:   Goals Addressed             This Visit's Progress    COMPLETED: Chronic Care Management       RNCM: Monitor and Manage My Blood Sugar-Diabetes Type 2   On track    Timeframe:  Long-Range Goal Priority:  High Start Date:    01/05/21                         Expected End Date:    06/03/21                   Follow Up Date 02/01/21    - check blood sugar at prescribed times - check blood sugar if I feel it is too high or too low - enter blood sugar readings and medication or insulin into daily log - take the blood sugar log to all doctor visits - take the blood sugar meter to all doctor visits    Why is this important?   Checking your blood sugar at home helps to keep it from getting very high or very low.  Writing the results in a diary or log helps the doctor know how to care for you.  Your blood sugar log should have the time, date and the results.  Also, write down the amount of insulin or other medicine that you take.  Other information, like what you ate, exercise done and how you were feeling, will also be helpful.     Notes:       RNCM:Prevent Falls-Dementia   On track    Timeframe:  Long-Range Goal Priority:  Medium Start Date:   01/05/21                          Expected End Date:    06/03/21                   Follow Up Date 02/01/21    - always use handrails on the stairs - always wear shoes or slippers with non-slip sole - keep a flashlight by the bed - keep cell phone with me always - make an emergency alert plan in case I fall - pick up clutter from the floors - remove throw rugs or use nonslip pads - use a cane or walker - use nightlight in the bathroom, halls    Why is this important?   There may be trouble with balance and getting around. Falls can happen.    Notes:       Track and Manage My Blood Pressure-Hypertension   On track    Timeframe:  Long-Range Goal Priority:  Medium Start Date:     01/05/21                        Expected End Date:  06/03/21                     Follow Up Date 02/01/21    - check blood pressure weekly - choose a place to take my blood pressure (home, clinic or office, retail store) - write blood pressure results in a log or diary    Why is this important?   You won't feel high blood pressure, but it can still hurt your blood vessels.  High blood pressure can cause heart or kidney problems. It can  also cause a stroke.  Making lifestyle changes like losing a little weight or eating less salt will help.  Checking your blood pressure at home and at different times of the day can help to control blood pressure.  If the doctor prescribes medicine remember to take it the way the doctor ordered.  Call the office if you cannot afford the medicine or if there are questions about it.     Notes:        Diabetes Mellitus Action Plan Following a diabetes action plan is a way for you to manage your diabetes (diabetes mellitus) symptoms. The plan is color-coded to help you understand what actions you need to take based on any symptoms you are having. If you have symptoms in the red zone, you need medical care right away. If you have symptoms in the yellow zone, you are having problems. If you have symptoms in the green zone, you are doing well. Learning about and understanding diabetes can take time. Follow the plan that you develop with your health care provider. Know the target range for your blood sugar (glucose) level, and review your treatment plan with your health care provider at eachvisit. The target range for my blood sugar level is __________________________ mg/dL. Red zone Get medical help right away if you have any of the following symptoms: A blood sugar test result that is below 54 mg/dL (3 mmol/L). A blood sugar test result that is at or above 240 mg/dL (13.3 mmol/L) for 2 days in a row. Confusion or trouble thinking clearly. Difficulty breathing. Sickness or a fever for 2  or more days that is not getting better. Moderate or large ketone levels in your urine. Feeling tired or having no energy. If you have any red zone symptoms, do not wait to see if the symptoms will go away. Get medical help right away. Call your local emergency services (911 in the [place] Do not drive yourself to the hospital. If you have severely low blood sugar (severe hypoglycemia) and you cannot eat or drink, you may need glucagon. Make sure a family member or close friend knows how to check your blood sugar and how to give youglucagon. You may need to be treated in a hospital for this condition. Yellow zone If you have any of the following symptoms, your diabetes is not under control and you may need to make some changes: A blood sugar test result that is at or above 240 mg/dL (13.3 mmol/L) for 2 days in a row. Blood sugar test results that are below 70 mg/dL (3.9 mmol/L). Other symptoms of hypoglycemia, such as: Shaking or feeling light-headed. Confusion or irritability. Feeling hungry. Having a fast heartbeat. If you have any yellow zone symptoms: Treat your hypoglycemia by eating or drinking 15 grams of a rapid-acting carbohydrate. Follow the 15:15 rule: Take 15 grams of a rapid-acting carbohydrate, such as: 1 tube of glucose gel. 4 glucose pills. 4 oz (120 mL) of fruit juice. 4 oz (120 mL) of regular (not diet) soda. Check your blood sugar 15 minutes after you take the carbohydrate. If the repeat blood sugar test is still at or below 70 mg/dL (3.9 mmol/L), take 15 grams of a carbohydrate again. If your blood sugar does not increase above 70 mg/dL (3.9 mmol/L) after 3 tries, get medical help right away. After your blood sugar returns to normal, eat a meal or a snack within 1 hour. Keep taking your daily medicines as told by your health care provider.  Check your blood sugar more often than you normally would. Write down your results. Call your health care provider if you have  trouble keeping your blood sugar in your target range.  Green zone These signs mean you are doing well and you can continue what you are doing to manage your diabetes: Your blood sugar is within your personal target range. For most people, a blood sugar level before a meal (preprandial) should be 80-130 mg/dL (4.4-7.2 mmol/L). You feel well, and you are able to do daily activities. If you are in the green zone, continue to manage your diabetes as told by your health care provider. To do this: Eat a healthy diet. Exercise regularly. Check your blood sugar as told by your health care provider. Take your medicines as told by your health care provider.  Where to find more information American Diabetes Association (ADA): diabetes.org Association of Diabetes Care & Education Specialists (ADCES): diabeteseducator.org Summary Following a diabetes action plan is a way for you to manage your diabetes symptoms. The plan is color-coded to help you understand what actions you need to take based on any symptoms you are having. Follow the plan that you develop with your health care provider. Make sure you know your personal target blood sugar level. Review your treatment plan with your health care provider at each visit. This information is not intended to replace advice given to you by your health care provider. Make sure you discuss any questions you have with your healthcare provider. Document Revised: 12/26/2019 Document Reviewed: 12/26/2019 Elsevier Patient Education  2022 [employer].   Consent to CCM Services: Ms. Lasota was given information about Chronic Care Management services today including:  CCM service includes personalized support from designated clinical staff supervised by her physician, including individualized plan of care and coordination with other care providers 24/7 contact phone numbers for assistance for urgent and routine care needs. Service will only be billed when office  clinical staff spend 20 minutes or more in a month to coordinate care. Only one practitioner may furnish and bill the service in a calendar month. The patient may stop CCM services at any time (effective at the end of the month) by phone call to the office staff. The patient will be responsible for cost sharing (co-pay) of up to 20% of the service fee (after annual deductible is met).  Patient agreed to services and verbal consent obtained.   Patient verbalizes understanding of instructions provided today and agrees to view in [place].   Telephone follow up appointment with care management team member scheduled for: 02/01/21 at 11:30 AM  [place], Jackquline Denmark, CDE Care Management Coordinator [facility] [phone], Mobile [phone]   CLINICAL CARE PLAN: Patient Care Plan: RNCM:Dementia (Adult)     Problem Identified: Harm or Injury   Priority: Medium     Long-Range Goal: Harm or Injury Prevented due to impaired memory   Start Date: 01/05/2021  Expected End Date: 06/03/2021  This Visit's Progress: On track  Priority: Medium  Note:   Current Barriers:  Knowledge Deficits related to fall precautions in patient with dementia, type 2 DM, HTN, HLD  Decreased adherence to prescribed treatment for fall prevention Unable to independently self manage impaired memory Unable to self administer medications as prescribed Does not adhere to provider recommendations re:  Unable to perform IADLs independently Knowledge Deficits related to management of memory impairment and caregiver stress Non-adherence to prescribed medication regimen Cognitive Deficits Spoke with pt and daughter [name]  Production assistant, radio.  Daughter states that pt has become on forgetful this last year especially after she had her falls.  States that she and her brothers have been helping pt but they do not live with her.  States pt has not been taking her medications regularly.  States  her brother fills her medication box but she still forgets to take them.  States pt has a cane but she does not aways use it.   Clinical Goal(s):  patient will demonstrate improved adherence to prescribed treatment plan for decreasing falls as evidenced by patient reporting and review of EMR patient will verbalize using fall risk reduction strategies discussed patient will not experience additional falls patient will verbalize understanding of plan for self management of dementia and fall safety patient will take all medications exactly as prescribed and will call provider for medication related questions patient will attend all scheduled medical appointments: Dr. Cruzita Lederer 04/02/21, Dr, Delice Lesch 05/24/21 patient will work with CM clinical social worker to help with caregiver stress and possible need for higher level of care in the future the patient will demonstrate ongoing self health care management ability Interventions:  Collaboration with Dorothyann Peng, NP regarding development and update of comprehensive plan of care as evidenced by provider attestation and co-signature Inter-disciplinary care team collaboration (see longitudinal plan of care) Provided written and verbal education re: Potential causes of falls and Fall prevention strategies Reviewed medications and discussed potential side effects of medications such as dizziness and frequent urination Assessed for s/s of orthostatic hypotension Assessed for falls since last encounter. Assessed patients knowledge of fall risk prevention secondary to previously provided education. Assessed working status of life alert bracelet and patient adherence Provided patient information for fall alert systems Evaluation of current treatment plan related to  management of dementia and fall prevention  and patient's adherence to plan as established by provider. Reviewed scheduled/upcoming provider appointments including:  Social Work referral for  caregiver stress, respite and possible need for higher level of care in the future Pharmacy referral for declined at this time by caregiver Self-Care Deficits:  Unable to self administer medications as prescribed Unable to perform IADLs independently Patient Goals:  - Utilize cane (assistive device) appropriately with all ambulation - De-clutter walkways - Change positions slowly - Wear secure fitting shoes at all times with ambulation - Utilize home lighting for dim lit areas - Demonstrate self and pet awareness at all times Follow Up Plan: Telephone follow up appointment with care management team member scheduled for: 02/01/21 at 11:30 AM The patient has been provided with contact information for the care management team and has been advised to call with any health related questions or concerns.      Patient Care Plan: RNCM:Diabetes Type 2 (Adult)     Problem Identified: Lack of long term self mangement of Type 2 Diabetes   Priority: High     Long-Range Goal: Effective self management of Type 2 Diabetes   Start Date: 01/05/2021  Expected End Date: 06/03/2021  This Visit's Progress: On track  Priority: High  Note:   Objective:  Lab Results  Component Value Date   HGBA1C 10.4 (A) 12/25/2020   Lab Results  Component Value Date   CREATININE 1.61 (H) 08/21/2020   CREATININE 1.81 (H) 04/08/2020   CREATININE 1.74 (H) 11/13/2019   No results found for: EGFR Current Barriers:  Knowledge Deficits related to basic Diabetes pathophysiology and self care/management Knowledge Deficits related to medications used for management of diabetes  Cognitive Deficits Unable to independently self manage type 2 diabetes with hx of dementia, HTN and HLD Unable to self administer medications as prescribed Unable to perform IADLs independently Daughter states that her brother gives pt her morning insulin but pt forgets to take her mealtime insulin unless reminded.  States she has the [employer]  and her sugars range from 150-350 depending on the time of day.  Denies any recent low blood sugars.  States pt likes to eat sweets but she has cut back on drinking sweet tea to about every other day.  States pt has an Alexa that reminds her to take her medications but she does not listen to her directions Case Manager Clinical Goal(s):  patient will demonstrate improved adherence to prescribed treatment plan for diabetes self care/management as evidenced by: daily monitoring and recording of CBG  adherence to ADA/ carb modified diet adherence to prescribed medication regimen contacting provider for new or worsened symptoms or questions Interventions:  Collaboration with Dorothyann Peng, NP regarding development and update of comprehensive plan of care as evidenced by provider attestation and co-signature Inter-disciplinary care team collaboration (see longitudinal plan of care) Provided education to patient about basic DM disease process Reviewed medications with patient and discussed importance of medication adherence Discussed plans with patient for ongoing care management follow up and provided patient with direct contact information for care management team Provided patient with written educational materials related to hypo and hyperglycemia and importance of correct treatment Reviewed scheduled/upcoming provider appointments including: Dr. Cruzita Lederer 04/02/21 Dr Delice Lesch 05/24/21 Advised patient, providing education and rationale, to check cbg 4 times and record, calling provider for findings outside established parameters.   Referral made to pharmacy team for assistance with caregiver declines at this time Referral made to social work team for assistance with caregiver stress, respite care and possible need for placement in the future Review of patient status, including review of consultants reports, relevant laboratory and other test results, and medications completed. Encouraged to cut back on the  sweet tea Self-Care Activities - Self administers insulin as prescribed Self administers injectable DM medication (Trulicity) as prescribed Attends all scheduled provider appointments Checks blood sugars as prescribed and utilize hyper and hypoglycemia protocol as needed Adheres to prescribed ADA/carb modified Patient Goals: - check blood sugar at prescribed times - check blood sugar if I feel it is too high or too low - enter blood sugar readings and medication or insulin into daily log - take the blood sugar log to all doctor visits - take the blood sugar meter to all doctor visits - drink 6 to 8 glasses of water each day - limit fast food meals to no more than 1 per week - switch to sugar-free drinks - keep appointment with eye doctor - check feet daily for cuts, sores or redness Follow Up Plan: Telephone follow up appointment with care management team member scheduled for: 02/01/21 at 11:30 AM The patient has been provided with contact information for the care management team and has been advised to call with any health related questions or concerns.

## 2021-01-06 ENCOUNTER — Telehealth: Payer: Self-pay | Admitting: *Deleted

## 2021-01-06 NOTE — Chronic Care Management (AMB) (Signed)
  Care Management   Note  01/06/2021 Name: [name] [name] MRN: [id number] DOB: 1929-03-18  [name] [name] is a 87 y.o. year old female who is a primary care patient of Nafziger, Tommi Rumps, NP and is actively engaged with the care management team. I reached out to [name] by phone today to assist with scheduling an initial visit with the Licensed Clinical Social Worker  Follow up plan: Unsuccessful telephone outreach attempt made. A HIPAA compliant phone message was left for the patient providing contact information and requesting a return call.  The care management team will reach out to the patient again over the next 7 days.  If patient returns call to provider office, please advise to call [place] at [phone].  [place] Management

## 2021-01-13 NOTE — Chronic Care Management (AMB) (Signed)
Chronic Care Management   Note  01/13/2021 Name: MERILDA TYNDALE MRN: [id number] DOB: 04/16/1929  JORENE LAHRMAN is a 87 y.o. year old female who is a primary care patient of Shirline Frees, NP. NAVNEET BUHR is currently enrolled in care management services. An additional referral for Social Work was placed.   Follow up plan: Telephone appointment with care management team member scheduled for:02/19/2021  [facility] Guide, Embedded Care Coordination [facility] Management

## 2021-01-19 ENCOUNTER — Telehealth: Payer: Medicare HMO | Admitting: *Deleted

## 2021-01-19 ENCOUNTER — Telehealth: Payer: Self-pay | Admitting: *Deleted

## 2021-01-19 NOTE — Telephone Encounter (Signed)
  Care Management   Follow Up Note   01/19/2021 Name: [name] [name] MRN: [id number] DOB: Jan 12, 1929   Referred by: Dorothyann Peng, NP Reason for referral : Chronic Care Management in Patient with Type II Diabetes Mellitus with Diabetic Nephropathy, with Long-Term Current Insulin Use, Pure Hypercholesterolemia, Essential Hypertension, Cognitive Impairment, and Osteoarthritis. Unsuccessful Initial Outreach Call Attempt w/ Patient and Daughter, [name] [name].  Unsuccessful outreach calls were placed to both patient and daughter, [name] [name] today.  HIPAA compliant messages were left on voicemail, as LCSW continues to await a return call.  LCSW will request that the Care Guide try and reschedule a telephone outreach call to patient and [name] with LCSW again next week.  Follow Up Plan: The patient has been provided with contact information for the care management team and has been advised to call with any health related questions or concerns.   Nat Christen LCSW Licensed Clinical Social Worker [place] [phone]

## 2021-01-29 ENCOUNTER — Telehealth: Payer: Self-pay

## 2021-01-29 NOTE — Chronic Care Management (AMB) (Signed)
Care Management   Note  01/29/2021 Name: [name] [name] MRN: [id number] DOB: 22-Apr-1929  [name] [name] is a 87 y.o. year old female who is a primary care patient of Nafziger, Kandee Keen, NP and is actively engaged with the care management team. I reached out to Jannette Fogo by phone today to assist with re-scheduling a follow up visit with the Licensed Clinical Social Worker  Follow up plan: Unsuccessful telephone outreach attempt made. A HIPAA compliant phone message was left for the patient providing contact information and requesting a return call.  The care management team will reach out to the patient again over the next 7 days.  If patient returns call to provider office, please advise to call Embedded Care Management Care Guide Penne Lash  at [phone]  Penne Lash, RMA Care Guide, Embedded Care Coordination [facility]  [place], [place] [address] Direct Dial: [phone] [email or web address]

## 2021-02-01 ENCOUNTER — Ambulatory Visit (INDEPENDENT_AMBULATORY_CARE_PROVIDER_SITE_OTHER): Payer: Medicare HMO

## 2021-02-01 DIAGNOSIS — I10 Essential (primary) hypertension: Secondary | ICD-10-CM

## 2021-02-01 DIAGNOSIS — Z794 Long term (current) use of insulin: Secondary | ICD-10-CM | POA: Diagnosis not present

## 2021-02-01 DIAGNOSIS — E1121 Type 2 diabetes mellitus with diabetic nephropathy: Secondary | ICD-10-CM | POA: Diagnosis not present

## 2021-02-01 DIAGNOSIS — R4189 Other symptoms and signs involving cognitive functions and awareness: Secondary | ICD-10-CM

## 2021-02-01 NOTE — Patient Instructions (Signed)
Visit Information  PATIENT GOALS:  Goals Addressed             This Visit's Progress    RNCM: Monitor and Manage My Blood Sugar-Diabetes Type 2   On track    Timeframe:  Long-Range Goal Priority:  High Start Date:    01/05/21                         Expected End Date:    06/03/21                   Follow Up Date 03/09/21    - check blood sugar at prescribed times - check blood sugar if I feel it is too high or too low - enter blood sugar readings and medication or insulin into daily log - take the blood sugar log to all doctor visits - take the blood sugar meter to all doctor visits    Why is this important?   Checking your blood sugar at home helps to keep it from getting very high or very low.  Writing the results in a diary or log helps the doctor know how to care for you.  Your blood sugar log should have the time, date and the results.  Also, write down the amount of insulin or other medicine that you take.  Other information, like what you ate, exercise done and how you were feeling, will also be helpful.     Notes:      RNCM:Prevent Falls-Dementia   On track    Timeframe:  Long-Range Goal Priority:  Medium Start Date:   01/05/21                          Expected End Date:    06/03/21                   Follow Up Date 03/09/21    - always use handrails on the stairs - always wear shoes or slippers with non-slip sole - keep a flashlight by the bed - keep cell phone with me always - make an emergency alert plan in case I fall - pick up clutter from the floors - remove throw rugs or use nonslip pads - use a cane or walker - use nightlight in the bathroom, halls    Why is this important?   There may be trouble with balance and getting around. Falls can happen.    Notes:      Track and Manage My Blood Pressure-Hypertension   On track    Timeframe:  Long-Range Goal Priority:  Medium Start Date:     01/05/21                       Expected End Date:  06/03/21                      Follow Up Date 03/09/21    - check blood pressure weekly - choose a place to take my blood pressure (home, clinic or office, retail store) - write blood pressure results in a log or diary    Why is this important?   You won't feel high blood pressure, but it can still hurt your blood vessels.  High blood pressure can cause heart or kidney problems. It can also cause a stroke.  Making lifestyle changes like losing a little weight or  eating less salt will help.  Checking your blood pressure at home and at different times of the day can help to control blood pressure.  If the doctor prescribes medicine remember to take it the way the doctor ordered.  Call the office if you cannot afford the medicine or if there are questions about it.     Notes:         Patient verbalizes understanding of instructions provided today and agrees to view in [place].   Telephone follow up appointment with care management team member scheduled for:  Peter Garter RN, [facility], CDE Care Management Coordinator [place] [phone], Mobile [phone]

## 2021-02-01 NOTE — Chronic Care Management (AMB) (Signed)
Chronic Care Management   CCM RN Visit Note  02/01/2021 Name: [name] [name] MRN: [id number] DOB: 16-Nov-1928  Subjective: [name] [name] is a 87 y.o. year old female who is a primary care patient of Dorothyann Peng, NP. The care management team was consulted for assistance with disease management and care coordination needs.    Engaged with patient by telephone for follow up visit in response to provider referral for case management and/or care coordination services.   Consent to Services:  The patient was given information about Chronic Care Management services, agreed to services, and gave verbal consent prior to initiation of services.  Please see initial visit note for detailed documentation.   Patient agreed to services and verbal consent obtained.   Assessment: Review of patient past medical history, allergies, medications, health status, including review of consultants reports, laboratory and other test data, was performed as part of comprehensive evaluation and provision of chronic care management services.   SDOH (Social Determinants of Health) assessments and interventions performed:    CCM Care Plan  Allergies  Allergen Reactions   Metformin     REACTION: diarrhea   Sitagliptin Phosphate     REACTION: abdominal pain    Outpatient Encounter Medications as of 02/01/2021  Medication Sig   Acetaminophen 650 MG TABS Take 1 tablet (650 mg total) by mouth 3 (three) times daily as needed.   aspirin 81 MG tablet Take 81 mg by mouth daily.   Blood Glucose Calibration (ACCU-CHEK GUIDE CONTROL) LIQD Use as directed   Blood Glucose Monitoring Suppl (ACCU-CHEK AVIVA PLUS) w/Device KIT Use to check blood sugar 3 times a day   Blood Glucose Monitoring Suppl (ACCU-CHEK GUIDE) w/Device KIT Use as directed   calcium-vitamin D (OSCAL WITH D 500-200) 500-200 MG-UNIT per tablet Take 1 tablet by mouth daily.   Continuous Blood Gluc Sensor (FREESTYLE LIBRE 14 DAY SENSOR) MISC Use to  check blood glucose 3 times a day   Dulaglutide (TRULICITY) 3 QH/4.7ML SOPN Inject 3 mg into the skin once a week.   fluticasone (FLONASE) 50 MCG/ACT nasal spray Place 1 spray into the nose daily.   Glucagon 3 MG/DOSE POWD Place 3 mg into the nose once as needed for up to 1 dose.   glucose blood (ACCU-CHEK AVIVA PLUS) test strip Use to check blood sugar 3 times a day.   glucose blood (ACCU-CHEK GUIDE) test strip CHECK BLOOD SUGAR THREE TIMES DAILY   insulin NPH Human (NOVOLIN N) 100 UNIT/ML injection Inject 0.24 mLs (24 Units total) into the skin daily before breakfast. Pens please   Insulin Pen Needle (BD PEN NEEDLE NANO U/F) 32G X 4 MM MISC USE TO INJECT INSULIN 4 TIMES DAILY AS DIRECTED.   Lancets (ONETOUCH ULTRASOFT) lancets Use as instructed to test 3 times daily   Multiple Vitamin (MULTIVITAMIN) tablet Take 1 tablet by mouth daily.   NOVOLOG FLEXPEN 100 UNIT/ML FlexPen INJECT 20 TO  30 UNITS SUBCUTANEOUSLY THREE TIMES DAILY WITH MEALS   simvastatin (ZOCOR) 40 MG tablet TAKE 1 TABLET EVERY DAY   trazodone (DESYREL) 300 MG tablet Take 1 tablet (300 mg total) by mouth at bedtime.   venlafaxine XR (EFFEXOR-XR) 75 MG 24 hr capsule Take 1 capsule (75 mg total) by mouth daily.   No facility-administered encounter medications on file as of 02/01/2021.    Patient Active Problem List   Diagnosis Date Noted   Overweight (BMI 25.0-29.9) 03/15/2018   Type 2 diabetes mellitus with diabetic nephropathy, with long-term current  use of insulin ([place]) 06/17/2014   VITAMIN B12 DEFICIENCY 07/20/2010   LOW BACK PAIN 01/13/2010   OVERACTIVE BLADDER 08/20/2009   DIARRHEA, CHRONIC 08/20/2009   Fatigue 10/20/2008   RECTAL PROLAPSE 09/20/2007   HYPERCHOLESTEROLEMIA 02/01/2007   Essential hypertension 02/01/2007   Osteoarthritis 02/01/2007    Conditions to be addressed/monitored:HTN, HLD, DMII, and cognitive impairment  Care Plan : RNCM:Dementia (Adult)  Updates made by Dimitri Ped, RN since  02/01/2021 12:00 AM     Problem: Harm or Injury   Priority: Medium     Long-Range Goal: Harm or Injury Prevented due to impaired memory   Start Date: 01/05/2021  Expected End Date: 06/03/2021  This Visit's Progress: On track  Recent Progress: On track  Priority: Medium  Note:   Current Barriers:  Knowledge Deficits related to fall precautions in patient with dementia, type 2 DM, HTN, HLD  Decreased adherence to prescribed treatment for fall prevention Unable to independently self manage impaired memory Unable to self administer medications as prescribed Does not adhere to provider recommendations re:  Unable to perform IADLs independently Knowledge Deficits related to management of memory impairment and caregiver stress Non-adherence to prescribed medication regimen Cognitive Deficits Spoke with pt and daughter Susette Racer Designated [employer] Release.  Daughter states that pt has become on forgetful this last year especially after she had her falls.  States that she and her brothers have been helping pt but they do not live with her.  States pt has not been taking her medications regularly.  States her brother fills her medication box but she still forgets to take them.  States pt has a cane but she does not aways use it.  02/01/21-Daughter states pt has been about the same.  Denies any falls.  States pt is still driving short distances to Facilities [place].  Clinical Goal(s):  patient will demonstrate improved adherence to prescribed treatment plan for decreasing falls as evidenced by patient reporting and review of EMR patient will verbalize using fall risk reduction strategies discussed patient will not experience additional falls patient will verbalize understanding of plan for self management of dementia and fall safety patient will take all medications exactly as prescribed and will call provider for medication related questions patient will attend all scheduled medical  appointments: Dr. Cruzita Lederer 04/02/21, Dr, Delice Lesch 05/24/21 patient will work with CM clinical social worker to help with caregiver stress and possible need for higher level of care in the future the patient will demonstrate ongoing self health care management ability Interventions:  Collaboration with Dorothyann Peng, NP regarding development and update of comprehensive plan of care as evidenced by provider attestation and co-signature Inter-disciplinary care team collaboration (see longitudinal plan of care) Reinforced verbal education re: Potential causes of falls and Fall prevention strategies Reviewed medications and discussed potential side effects of medications such as dizziness and frequent urination Assessed for s/s of orthostatic hypotension Assessed for falls since last encounter. Assessed patients knowledge of fall risk prevention secondary to previously provided education. Evaluation of current treatment plan related to  management of dementia and fall prevention  and patient's adherence to plan as established by provider. Reviewed scheduled/upcoming provider appointments including:  Social Work referral for caregiver stress, respite and possible need for higher level of care in the future social worker has not been able to make contact with pt/caregiver Pharmacy referral for declined at this time by caregiver Encouraged daughter to make contact with Education officer, museum for assistance Self-Care Deficits:  Unable  to self administer medications as prescribed Unable to perform IADLs independently Patient Goals:  - Utilize cane (assistive device) appropriately with all ambulation - De-clutter walkways - Change positions slowly - Wear secure fitting shoes at all times with ambulation - Utilize home lighting for dim lit areas - Demonstrate self and pet awareness at all times Follow Up Plan: Telephone follow up appointment with care management team member scheduled for: 03/09/21 at 11:30 AM The  patient has been provided with contact information for the care management team and has been advised to call with any health related questions or concerns.      Care Plan : RNCM:Diabetes Type 2 (Adult)  Updates made by Dimitri Ped, RN since 02/01/2021 12:00 AM     Problem: Lack of long term self mangement of Type 2 Diabetes   Priority: High     Long-Range Goal: Effective self management of Type 2 Diabetes   Start Date: 01/05/2021  Expected End Date: 06/03/2021  This Visit's Progress: On track  Recent Progress: On track  Priority: High  Note:   Objective:  Lab Results  Component Value Date   HGBA1C 10.4 (A) 12/25/2020   Lab Results  Component Value Date   CREATININE 1.61 (H) 08/21/2020   CREATININE 1.81 (H) 04/08/2020   CREATININE 1.74 (H) 11/13/2019   No results found for: EGFR Current Barriers:  Knowledge Deficits related to basic Diabetes pathophysiology and self care/management Knowledge Deficits related to medications used for management of diabetes Cognitive Deficits Unable to independently self manage type 2 diabetes with hx of dementia, HTN and HLD Unable to self administer medications as prescribed Unable to perform IADLs independently Spoke with daughter Susette Racer [employer].  States her brother has been witting down pts blood sugar readings and how much insulin she took.  States that her brother gives pt her morning insulin but pt forgets to take her mealtime insulin unless reminded.  States she has the [employer] and her sugars range from 160-350 depending on the time of day.  Denies any recent low blood sugars.  States that they buy healthy food for pt to eat but pt will drive to the dollar store and buy junk/sweets. States pt has an Alexa that reminds her to take her medications but she does not listen to her directions. No recent B/P readings Case Manager Clinical Goal(s):  patient will demonstrate improved adherence to prescribed treatment  plan for diabetes self care/management as evidenced by: daily monitoring and recording of CBG  adherence to ADA/ carb modified diet adherence to prescribed medication regimen contacting provider for new or worsened symptoms or questions Interventions:  Collaboration with Carlisle Cater, Tommi Rumps, NP regarding development and update of comprehensive plan of care as evidenced by provider attestation and co-signature Inter-disciplinary care team collaboration (see longitudinal plan of care) Provided education to patient about basic DM disease process Reviewed medications with patient and discussed importance of medication adherence Discussed plans with patient for ongoing care management follow up and provided patient with direct contact information for care management team Provided patient with written educational materials related to hypo and hyperglycemia and importance of correct treatment Reviewed scheduled/upcoming provider appointments including: Dr. Cruzita Lederer 04/02/21 Dr Delice Lesch 05/24/21 Advised patient, providing education and rationale, to check cbg 4 times and record, calling provider for findings outside established parameters.   Referral made to pharmacy team for assistance with caregiver declines at this time Referral made to social work team for assistance with caregiver stress, respite care and  possible need for placement in the future social worker has not been able to make contact with pt/caregiver Review of patient status, including review of consultants reports, relevant laboratory and other test results, and medications completed. Advised patient/caregiver, providing education and rationale, to monitor blood pressure weekly and record, calling PCP for findings outside established parameters.  Reinforced to cut back on the sweet tea and sweets  Encouraged to return calls to arrange visit with social worker Reviewed importance of keeping blood sugars in range to help prevent or delay  complications Self-Care Activities - Self administers insulin as prescribed Self administers injectable DM medication (Trulicity) as prescribed Attends all scheduled provider appointments Checks blood sugars as prescribed and utilize hyper and hypoglycemia protocol as needed Adheres to prescribed ADA/carb modified Patient Goals: - check blood sugar at prescribed times - check blood sugar if I feel it is too high or too low - enter blood sugar readings and medication or insulin into daily log - take the blood sugar log to all doctor visits - take the blood sugar meter to all doctor visits - drink 6 to 8 glasses of water each day - limit fast food meals to no more than 1 per week - switch to sugar-free drinks - keep appointment with eye doctor - check feet daily for cuts, sores or redness - check blood pressure weekly - choose a place to take my blood pressure (home, clinic or office, retail store) - write blood pressure results in a log or diary Follow Up Plan: Telephone follow up appointment with care management team member scheduled for: 03/09/21 at 11:30 AM The patient has been provided with contact information for the care management team and has been advised to call with any health related questions or concerns.       Plan:Telephone follow up appointment with care management team member scheduled for:  03/09/21 and The patient has been provided with contact information for the care management team and has been advised to call with any health related questions or concerns.  Peter Garter RN, Jackquline Denmark, CDE Care Management Coordinator [facility] [phone], Mobile [phone]

## 2021-02-23 NOTE — Chronic Care Management (AMB) (Signed)
Care Management   Note  02/23/2021 Name: [name] [name] MRN: [id number] DOB: 1929-06-18  [name] [name] is a 87 y.o. year old female who is a primary care patient of Nafziger, Kandee Keen, NP and is actively engaged with the care management team. I reached out to Jannette Fogo by phone today to assist with re-scheduling a follow up visit with the Licensed Clinical Social Worker  Follow up plan: Unsuccessful telephone outreach attempt made. A HIPAA compliant phone message was left for the patient providing contact information and requesting a return call.  The care management team will reach out to the patient again over the next 7 days.  If patient returns call to provider office, please advise to call Embedded Care Management Care Guide [name] [name]  at [phone]  [name] [name], RMA Care Guide, Embedded Care Coordination [facility]  [place], [place] [address] Direct Dial: [phone] [name] Website: [email or web address]

## 2021-03-09 ENCOUNTER — Ambulatory Visit (INDEPENDENT_AMBULATORY_CARE_PROVIDER_SITE_OTHER): Payer: Medicare HMO

## 2021-03-09 DIAGNOSIS — E7800 Pure hypercholesterolemia, unspecified: Secondary | ICD-10-CM

## 2021-03-09 DIAGNOSIS — E1121 Type 2 diabetes mellitus with diabetic nephropathy: Secondary | ICD-10-CM

## 2021-03-09 DIAGNOSIS — I10 Essential (primary) hypertension: Secondary | ICD-10-CM

## 2021-03-09 DIAGNOSIS — R4189 Other symptoms and signs involving cognitive functions and awareness: Secondary | ICD-10-CM

## 2021-03-09 DIAGNOSIS — Z794 Long term (current) use of insulin: Secondary | ICD-10-CM

## 2021-03-09 NOTE — Chronic Care Management (AMB) (Signed)
Chronic Care Management   CCM RN Visit Note  03/09/2021 Name: [name] [name] MRN: [id number] DOB: 04-24-1929  Subjective: [name] [name] is a 87 y.o. year old female who is a primary care patient of Dorothyann Peng, NP. The care management team was consulted for assistance with disease management and care coordination needs.    Engaged with patient by telephone for follow up visit in response to provider referral for case management and/or care coordination services.   Consent to Services:  The patient was given information about Chronic Care Management services, agreed to services, and gave verbal consent prior to initiation of services.  Please see initial visit note for detailed documentation.   Patient agreed to services and verbal consent obtained.   Assessment: Review of patient past medical history, allergies, medications, health status, including review of consultants reports, laboratory and other test data, was performed as part of comprehensive evaluation and provision of chronic care management services.   SDOH (Social Determinants of Health) assessments and interventions performed:    CCM Care Plan  Allergies  Allergen Reactions   Metformin     REACTION: diarrhea   Sitagliptin Phosphate     REACTION: abdominal pain    Outpatient Encounter Medications as of 03/09/2021  Medication Sig   Acetaminophen 650 MG TABS Take 1 tablet (650 mg total) by mouth 3 (three) times daily as needed.   aspirin 81 MG tablet Take 81 mg by mouth daily.   Blood Glucose Calibration (ACCU-CHEK GUIDE CONTROL) LIQD Use as directed   Blood Glucose Monitoring Suppl (ACCU-CHEK AVIVA PLUS) w/Device KIT Use to check blood sugar 3 times a day   Blood Glucose Monitoring Suppl (ACCU-CHEK GUIDE) w/Device KIT Use as directed   calcium-vitamin D (OSCAL WITH D 500-200) 500-200 MG-UNIT per tablet Take 1 tablet by mouth daily.   Continuous Blood Gluc Sensor (FREESTYLE LIBRE 14 DAY SENSOR) MISC Use to  check blood glucose 3 times a day   Dulaglutide (TRULICITY) 3 WN/4.6EV SOPN Inject 3 mg into the skin once a week.   fluticasone (FLONASE) 50 MCG/ACT nasal spray Place 1 spray into the nose daily.   Glucagon 3 MG/DOSE POWD Place 3 mg into the nose once as needed for up to 1 dose.   glucose blood (ACCU-CHEK AVIVA PLUS) test strip Use to check blood sugar 3 times a day.   glucose blood (ACCU-CHEK GUIDE) test strip CHECK BLOOD SUGAR THREE TIMES DAILY   insulin NPH Human (NOVOLIN N) 100 UNIT/ML injection Inject 0.24 mLs (24 Units total) into the skin daily before breakfast. Pens please   Insulin Pen Needle (BD PEN NEEDLE NANO U/F) 32G X 4 MM MISC USE TO INJECT INSULIN 4 TIMES DAILY AS DIRECTED.   Lancets (ONETOUCH ULTRASOFT) lancets Use as instructed to test 3 times daily   Multiple Vitamin (MULTIVITAMIN) tablet Take 1 tablet by mouth daily.   NOVOLOG FLEXPEN 100 UNIT/ML FlexPen INJECT 20 TO  30 UNITS SUBCUTANEOUSLY THREE TIMES DAILY WITH MEALS   simvastatin (ZOCOR) 40 MG tablet TAKE 1 TABLET EVERY DAY   trazodone (DESYREL) 300 MG tablet Take 1 tablet (300 mg total) by mouth at bedtime.   venlafaxine XR (EFFEXOR-XR) 75 MG 24 hr capsule Take 1 capsule (75 mg total) by mouth daily.   No facility-administered encounter medications on file as of 03/09/2021.    Patient Active Problem List   Diagnosis Date Noted   Overweight (BMI 25.0-29.9) 03/15/2018   Type 2 diabetes mellitus with diabetic nephropathy, with long-term current  use of insulin ([place]) 06/17/2014   VITAMIN B12 DEFICIENCY 07/20/2010   LOW BACK PAIN 01/13/2010   OVERACTIVE BLADDER 08/20/2009   DIARRHEA, CHRONIC 08/20/2009   Fatigue 10/20/2008   RECTAL PROLAPSE 09/20/2007   HYPERCHOLESTEROLEMIA 02/01/2007   Essential hypertension 02/01/2007   Osteoarthritis 02/01/2007    Conditions to be addressed/monitored:HTN, HLD, DMII, CKD Stage 4, and Dementia  Care Plan : RNCM:Dementia (Adult)  Updates made by Dimitri Ped, RN since  03/09/2021 12:00 AM     Problem: Harm or Injury   Priority: Medium     Long-Range Goal: Harm or Injury Prevented due to impaired memory   Start Date: 01/05/2021  Expected End Date: 06/03/2021  This Visit's Progress: On track  Recent Progress: On track  Priority: Medium  Note:   Current Barriers:  Knowledge Deficits related to fall precautions in patient with dementia, type 2 DM, HTN, HLD  Decreased adherence to prescribed treatment for fall prevention Unable to independently self manage impaired memory Unable to self administer medications as prescribed Does not adhere to provider recommendations re:  Unable to perform IADLs independently Knowledge Deficits related to management of memory impairment and caregiver stress Non-adherence to prescribed medication regimen Cognitive Deficits Spoke with pt and daughter Susette Racer Designated [employer] Release.  Daughter states that pt has become on forgetful this last year especially after she had her falls.  States that she and her brothers have been helping pt but they do not live with her.  States pt has not been taking her medications regularly.  States her brother fills her medication box but she still forgets to take them.  States pt has a cane but she does not aways use it.  03/09/21-Daughter states pt has been about the same.  States that she and her brothers help pt as needed. Daughter states she has had a lot going on and has not been able to speak with the social worker yet.  Denies any falls.  States pt is still driving short distances to Facilities [place].  Clinical Goal(s):  patient will demonstrate improved adherence to prescribed treatment plan for decreasing falls as evidenced by patient reporting and review of EMR patient will verbalize using fall risk reduction strategies discussed patient will not experience additional falls patient will verbalize understanding of plan for self management of dementia and fall safety patient  will take all medications exactly as prescribed and will call provider for medication related questions patient will attend all scheduled medical appointments: Dr. Cruzita Lederer 04/02/21, Dr, Delice Lesch 05/24/21 patient will work with CM clinical social worker to help with caregiver stress and possible need for higher level of care in the future the patient will demonstrate ongoing self health care management ability Interventions:  Collaboration with Dorothyann Peng, NP regarding development and update of comprehensive plan of care as evidenced by provider attestation and co-signature Inter-disciplinary care team collaboration (see longitudinal plan of care) Reinforced verbal education re: Potential causes of falls and Fall prevention strategies Reviewed medications and discussed potential side effects of medications such as dizziness and frequent urination Assessed for s/s of orthostatic hypotension Assessed for falls since last encounter. Assessed patients knowledge of fall risk prevention secondary to previously provided education. Evaluation of current treatment plan related to  management of dementia and fall prevention  and patient's adherence to plan as established by provider. Reviewed scheduled/upcoming provider appointments including:  Social Work referral for caregiver stress, respite and possible need for higher level of care in the future  social worker has not been able to make contact with pt/caregiver LCSW has not been able to make contact with daughter yet Pharmacy referral for declined at this time by caregiver Reinforced  daughter to make contact with social worker for assistance Self-Care Deficits:  Unable to self administer medications as prescribed Unable to perform IADLs independently Patient Goals:  - Utilize cane (assistive device) appropriately with all ambulation - De-clutter walkways - Change positions slowly - Wear secure fitting shoes at all times with ambulation - Utilize  home lighting for dim lit areas - Demonstrate self and pet awareness at all times Follow Up Plan: Telephone follow up appointment with care management team member scheduled for: 04/09/21 at 11:30 AM The patient has been provided with contact information for the care management team and has been advised to call with any health related questions or concerns.      Care Plan : RNCM:Diabetes Type 2 (Adult)  Updates made by Dimitri Ped, RN since 03/09/2021 12:00 AM     Problem: Lack of long term self mangement of Type 2 Diabetes   Priority: High     Long-Range Goal: Effective self management of Type 2 Diabetes   Start Date: 01/05/2021  Expected End Date: 06/03/2021  This Visit's Progress: On track  Recent Progress: On track  Priority: High  Note:   Objective:  Lab Results  Component Value Date   HGBA1C 10.4 (A) 12/25/2020   Lab Results  Component Value Date   CREATININE 1.61 (H) 08/21/2020   CREATININE 1.81 (H) 04/08/2020   CREATININE 1.74 (H) 11/13/2019   No results found for: EGFR Current Barriers:  Knowledge Deficits related to basic Diabetes pathophysiology and self care/management Knowledge Deficits related to medications used for management of diabetes Cognitive Deficits Unable to independently self manage type 2 diabetes with hx of dementia, HTN and HLD Unable to self administer medications as prescribed Unable to perform IADLs independently Spoke with daughter Susette Racer [employer]ated [employer] Release.  States her brother has been witting down pts blood sugar readings and how much insulin she took.  States that her brother gives pt her morning insulin but pt forgets to take her mealtime insulin unless reminded.  States she has the [employer] and her sugars range from [phone] depending on the time of day.  Denies any recent low blood sugars.  States that pt has been eating ice cream that the pt has bought herself. States that they buy healthy food for pt to eat but pt will  drive to the dollar store and buy junk/sweets. States  No recent B/P readings Case Manager Clinical Goal(s):  patient will demonstrate improved adherence to prescribed treatment plan for diabetes self care/management as evidenced by: daily monitoring and recording of CBG  adherence to ADA/ carb modified diet adherence to prescribed medication regimen contacting provider for new or worsened symptoms or questions Interventions:  Collaboration with Carlisle Cater, Tommi Rumps, NP regarding development and update of comprehensive plan of care as evidenced by provider attestation and co-signature Inter-disciplinary care team collaboration (see longitudinal plan of care) Provided education to patient about basic DM disease process Reviewed medications with patient and discussed importance of medication adherence Discussed plans with patient for ongoing care management follow up and provided patient with direct contact information for care management team Provided patient with written educational materials related to hypo and hyperglycemia and importance of correct treatment Reviewed scheduled/upcoming provider appointments including: Dr. Cruzita Lederer 04/02/21 Dr Delice Lesch 05/24/21 Advised patient, providing education and rationale, to  check cbg 4 times and record, calling provider for findings outside established parameters.   Referral made to pharmacy team for assistance with caregiver declines at this time Referral made to social work team for assistance with caregiver stress, respite care and possible need for placement in the future social worker has not been able to make contact with pt/caregiver Review of patient status, including review of consultants reports, relevant laboratory and other test results, and medications completed. Reinforced patient/caregiver, providing education and rationale, to monitor blood pressure weekly and record, calling PCP for findings outside established parameters.  Reinforced to cut back on  the sweet tea and sweets  Reinforced to return calls to arrange visit with social worker Reinforced importance of keeping blood sugars in range to help prevent or delay complications Self-Care Activities - Self administers insulin as prescribed Self administers injectable DM medication (Trulicity) as prescribed Attends all scheduled provider appointments Checks blood sugars as prescribed and utilize hyper and hypoglycemia protocol as needed Adheres to prescribed ADA/carb modified Patient Goals: - check blood sugar at prescribed times - check blood sugar if I feel it is too high or too low - enter blood sugar readings and medication or insulin into daily log - take the blood sugar log to all doctor visits - take the blood sugar meter to all doctor visits - drink 6 to 8 glasses of water each day - limit fast food meals to no more than 1 per week - switch to sugar-free drinks - keep appointment with eye doctor - check feet daily for cuts, sores or redness - check blood pressure weekly - choose a place to take my blood pressure (home, clinic or office, retail store) - write blood pressure results in a log or diary Follow Up Plan: Telephone follow up appointment with care management team member scheduled for: 04/09/21 at 11:30 AM The patient has been provided with contact information for the care management team and has been advised to call with any health related questions or concerns.       Plan:Telephone follow up appointment with care management team member scheduled for:  04/09/21 and The patient has been provided with contact information for the care management team and has been advised to call with any health related questions or concerns.  Peter Garter RN, Jackquline Denmark, CDE Care Management Coordinator [facility] [phone], Mobile [phone]

## 2021-03-09 NOTE — Patient Instructions (Signed)
Visit Information  PATIENT GOALS:  Goals Addressed             This Visit's Progress    RNCM: Monitor and Manage My Blood Sugar-Diabetes Type 2   On track    Timeframe:  Long-Range Goal Priority:  High Start Date:    01/05/21                         Expected End Date:    06/03/21                   Follow Up Date 04/09/21    - check blood sugar at prescribed times - check blood sugar if I feel it is too high or too low - enter blood sugar readings and medication or insulin into daily log - take the blood sugar log to all doctor visits - take the blood sugar meter to all doctor visits    Why is this important?   Checking your blood sugar at home helps to keep it from getting very high or very low.  Writing the results in a diary or log helps the doctor know how to care for you.  Your blood sugar log should have the time, date and the results.  Also, write down the amount of insulin or other medicine that you take.  Other information, like what you ate, exercise done and how you were feeling, will also be helpful.     Notes:      RNCM:Prevent Falls-Dementia   On track    Timeframe:  Long-Range Goal Priority:  Medium Start Date:   01/05/21                          Expected End Date:    06/03/21                   Follow Up Date 04/09/21    - always use handrails on the stairs - always wear shoes or slippers with non-slip sole - keep a flashlight by the bed - keep cell phone with me always - make an emergency alert plan in case I fall - pick up clutter from the floors - remove throw rugs or use nonslip pads - use a cane or walker - use nightlight in the bathroom, halls    Why is this important?   There may be trouble with balance and getting around. Falls can happen.    Notes:      RNCM:Track and Manage My Blood Pressure-Hypertension   On track    Timeframe:  Long-Range Goal Priority:  Medium Start Date:     01/05/21                       Expected End Date:  06/03/21                      Follow Up Date 04/09/21    - check blood pressure weekly - choose a place to take my blood pressure (home, clinic or office, retail store) - write blood pressure results in a log or diary    Why is this important?   You won't feel high blood pressure, but it can still hurt your blood vessels.  High blood pressure can cause heart or kidney problems. It can also cause a stroke.  Making lifestyle changes like losing a little weight or  eating less salt will help.  Checking your blood pressure at home and at different times of the day can help to control blood pressure.  If the doctor prescribes medicine remember to take it the way the doctor ordered.  Call the office if you cannot afford the medicine or if there are questions about it.     Notes:         Patient verbalizes understanding of instructions provided today and agrees to view in [place].   Telephone follow up appointment with care management team member scheduled for:  Peter Garter RN, [facility], CDE Care Management Coordinator [place] [phone], Mobile [phone]

## 2021-03-10 ENCOUNTER — Other Ambulatory Visit: Payer: Self-pay | Admitting: Adult Health

## 2021-03-10 DIAGNOSIS — E7800 Pure hypercholesterolemia, unspecified: Secondary | ICD-10-CM

## 2021-03-12 DIAGNOSIS — E1121 Type 2 diabetes mellitus with diabetic nephropathy: Secondary | ICD-10-CM | POA: Diagnosis not present

## 2021-03-17 ENCOUNTER — Ambulatory Visit: Payer: Medicare HMO | Admitting: *Deleted

## 2021-03-17 DIAGNOSIS — N184 Chronic kidney disease, stage 4 (severe): Secondary | ICD-10-CM

## 2021-03-17 DIAGNOSIS — R4189 Other symptoms and signs involving cognitive functions and awareness: Secondary | ICD-10-CM

## 2021-03-17 DIAGNOSIS — I10 Essential (primary) hypertension: Secondary | ICD-10-CM

## 2021-03-17 DIAGNOSIS — R2681 Unsteadiness on feet: Secondary | ICD-10-CM

## 2021-03-17 DIAGNOSIS — Z794 Long term (current) use of insulin: Secondary | ICD-10-CM

## 2021-03-17 DIAGNOSIS — E1122 Type 2 diabetes mellitus with diabetic chronic kidney disease: Secondary | ICD-10-CM

## 2021-03-17 DIAGNOSIS — M159 Polyosteoarthritis, unspecified: Secondary | ICD-10-CM

## 2021-03-17 DIAGNOSIS — M8949 Other hypertrophic osteoarthropathy, multiple sites: Secondary | ICD-10-CM

## 2021-03-17 DIAGNOSIS — E1121 Type 2 diabetes mellitus with diabetic nephropathy: Secondary | ICD-10-CM

## 2021-03-17 DIAGNOSIS — R296 Repeated falls: Secondary | ICD-10-CM

## 2021-03-17 DIAGNOSIS — R5382 Chronic fatigue, unspecified: Secondary | ICD-10-CM

## 2021-03-17 NOTE — Chronic Care Management (AMB) (Signed)
  Care Management   Follow Up Note   03/17/2021 Name: [name] [name] MRN: [id number] DOB: 1929/02/20   Referred by: Dorothyann Peng, NP Reason for referral : Care Coordination in Patient with Type II Diabetes Mellitus with Diabetic Nephropathy, with Long-Term Current Insulin Use, Pure Hypercholesterolemia, Essential Hypertension, Cognitive Impairment, Dementia, and Osteoarthritis.   Third unsuccessful telephone outreach was attempted today. The patient was referred to the case management team for assistance with care management and care coordination. The patient's primary care provider has been notified of our unsuccessful attempts to make or maintain contact with the patient. The care management team is pleased to engage with this patient at any time in the future should he/she be interested in assistance from the care management team.   [place] Licensed Clinical Social Worker [place] [phone]

## 2021-03-17 NOTE — Chronic Care Management (AMB) (Signed)
Care Management   Note  03/17/2021 Name: [name] [name] MRN: [id number] DOB: 09-Aug-1928  [name] [name] is a 87 y.o. year old female who is a primary care patient of Nafziger, Kandee Keen, NP and is actively engaged with the care management team. I reached out to Jannette Fogo by phone today to assist with re-scheduling an initial visit with the Licensed Clinical Social Worker  Follow up plan: Unable to make contact on outreach attempts x 3. PCP Shirline Frees, NP notified via routed documentation in medical record.   Penne Lash, RMA Care Guide, Embedded Care Coordination [facility]  [place], [place] [address] Direct Dial: [phone] [email or web address]: [email or web address]

## 2021-03-17 NOTE — Progress Notes (Signed)
3rd unsuccessful outreach  

## 2021-04-02 ENCOUNTER — Ambulatory Visit: Payer: Medicare HMO | Admitting: Internal Medicine

## 2021-04-02 ENCOUNTER — Other Ambulatory Visit: Payer: Self-pay | Admitting: Adult Health

## 2021-04-02 DIAGNOSIS — M8949 Other hypertrophic osteoarthropathy, multiple sites: Secondary | ICD-10-CM | POA: Diagnosis not present

## 2021-04-02 DIAGNOSIS — E1122 Type 2 diabetes mellitus with diabetic chronic kidney disease: Secondary | ICD-10-CM

## 2021-04-02 DIAGNOSIS — N184 Chronic kidney disease, stage 4 (severe): Secondary | ICD-10-CM | POA: Diagnosis not present

## 2021-04-02 DIAGNOSIS — I10 Essential (primary) hypertension: Secondary | ICD-10-CM

## 2021-04-02 DIAGNOSIS — Z794 Long term (current) use of insulin: Secondary | ICD-10-CM | POA: Diagnosis not present

## 2021-04-02 DIAGNOSIS — E7800 Pure hypercholesterolemia, unspecified: Secondary | ICD-10-CM | POA: Diagnosis not present

## 2021-04-02 DIAGNOSIS — E1121 Type 2 diabetes mellitus with diabetic nephropathy: Secondary | ICD-10-CM | POA: Diagnosis not present

## 2021-04-02 DIAGNOSIS — G4700 Insomnia, unspecified: Secondary | ICD-10-CM

## 2021-04-09 ENCOUNTER — Telehealth: Payer: Self-pay

## 2021-04-09 ENCOUNTER — Telehealth: Payer: Medicare HMO

## 2021-04-09 NOTE — Telephone Encounter (Signed)
  Care Management   Follow Up Note   04/09/2021 Name: [name] [name] MRN: [id number] DOB: 11-12-1928   Referred by: Dorothyann Peng, NP Reason for referral : Chronic Care Management (RNCM: Follow up Outreach Chronic Care Management and coordination needs-unsuccessful)   An unsuccessful telephone outreach was attempted today. The patient was referred to the case management team for assistance with care management and care coordination.   Follow Up Plan: The care management team will reach out to the patient again over the next 30 days.  Peter Garter RN, Jackquline [place], CDE Care Management Coordinator [facility] [phone], Mobile [phone]

## 2021-04-14 ENCOUNTER — Telehealth: Payer: Self-pay | Admitting: *Deleted

## 2021-04-14 NOTE — Chronic Care Management (AMB) (Signed)
  Care Management   Note  04/14/2021 Name: [name] [name] MRN: [id number] DOB: 1929-05-05  [name] [name] is a 87 y.o. year old female who is a primary care patient of Nafziger, Tommi Rumps, NP and is actively engaged with the care management team. I reached out to [name] by phone today to assist with re-scheduling a follow up visit with the RN Case Manager  Follow up plan: Unsuccessful telephone outreach attempt made. A HIPAA compliant phone message was left for the patient providing contact information and requesting a return call.  The care management team will reach out to the patient again over the next 7 days.  If patient returns call to provider office, please advise to call [place] at [phone].  [place] Management  Direct Dial: [phone]

## 2021-04-21 NOTE — Chronic Care Management (AMB) (Signed)
  Care Management   Note  04/21/2021 Name: STEPHANNE SALVIA MRN: [id number] DOB: 1929-01-06  SAKEENA WINGET is a 87 y.o. year old female who is a primary care patient of Nafziger, Tommi Rumps, NP and is actively engaged with the care management team. I reached out to [name] by phone today to assist with re-scheduling a follow up visit with the RN Case Manager  Follow up plan: Unsuccessful telephone outreach attempt made. A HIPAA compliant phone message was left for the patient providing contact information and requesting a return call.  The care management team will reach out to the patient again over the next 7 days.  If patient returns call to provider office, please advise to call [place] at [phone].  [place] Management  Direct Dial: [phone]

## 2021-04-28 NOTE — Chronic Care Management (AMB) (Signed)
  Care Management   Note  04/28/2021 Name: [name] [name] MRN: [id number] DOB: 06/26/1929  [name] [name] is a 87 y.o. year old female who is a primary care patient of Nafziger, Tommi Rumps, NP and is actively engaged with the care management team. I reached out to [name] by phone today to assist with re-scheduling a follow up visit with the RN Case Manager  Follow up plan: A third unsuccessful telephone outreach attempt made. A HIPAA compliant phone message was left for the patient providing contact information and requesting a return call. Unable to make contact on outreach attempts x 3. PCP Dorothyann Peng, NP notified via routed documentation in medical record. We have been unable to make contact with the patient for follow up. The care management team is available to follow up with the patient after provider conversation with the patient regarding recommendation for care management engagement and subsequent re-referral to the care management team.   [place] Management  Direct Dial: [phone]

## 2021-04-30 ENCOUNTER — Encounter: Payer: Self-pay | Admitting: Adult Health

## 2021-04-30 ENCOUNTER — Other Ambulatory Visit: Payer: Self-pay

## 2021-04-30 ENCOUNTER — Ambulatory Visit (INDEPENDENT_AMBULATORY_CARE_PROVIDER_SITE_OTHER): Payer: Medicare HMO | Admitting: Adult Health

## 2021-04-30 VITALS — BP 140/70 | HR 88 | Temp 98.3°F | Ht 63.0 in | Wt 158.0 lb

## 2021-04-30 DIAGNOSIS — Z23 Encounter for immunization: Secondary | ICD-10-CM | POA: Diagnosis not present

## 2021-04-30 DIAGNOSIS — R4189 Other symptoms and signs involving cognitive functions and awareness: Secondary | ICD-10-CM | POA: Diagnosis not present

## 2021-04-30 NOTE — Progress Notes (Addendum)
Subjective:    Patient ID: [name] [name], female    DOB: [birth year], 87 y.o.   MRN: [id number]  HPI 87 year old female who  has a past medical history of Asthma, DEGENERATIVE JOINT DISEASE, GENERALIZED (02/01/2007), DIARRHEA, CHRONIC (08/20/2009), DM (02/01/2007), FATIGUE (10/20/2008), HYPERCHOLESTEROLEMIA (02/01/2007), HYPERLIPIDEMIA (12/20/2007), HYPERTENSION (02/01/2007), LOW BACK PAIN (01/13/2010), MUSCLE CRAMPS (04/06/2010), MYALGIA (01/20/2010), OSTEOARTHRITIS (01/13/2010), OVERACTIVE BLADDER (08/20/2009), PLEURISY (06/18/2008), PNEUMONIA (06/18/2008), Rectal prolapse (09/20/2007), and VITAMIN B12 DEFICIENCY (07/20/2010).  She presents with her son today. She has known seizure like activity and has been diagnosed with vascular dementia.   Family has concerns today out worsening cognitive decline.  He is forgetting to take her medications and when family asked her if she did take them she often cannot remember if she did so.  Going along with this, she is also forgetting to monitor her blood sugars and unless a family member is there to check does not get done.  Often when family does check her blood sugars its in the upper 300-400 range. She is having problems using her phone, sometimes she cannot remember to turn it off and will lay it down and forget that her phone is on, when this happens family cannot get a hold of her. She has been noted to be eating ice cream out of a cart and using her fingers, when her daughter told her to get a spoon she looked at her daughter with a blank stare. Last week she drove to the grocery store, picked out her items, put them down on the counter and wandered around for an hour.  The clerk at the grocery store had to call the police because [place] and could not remember where she was or who she was.  Police called a family member and by the time they showed up she remembered where she was.   Review of Systems See HPI   Past Medical History:  Diagnosis Date    Asthma    DEGENERATIVE JOINT DISEASE, GENERALIZED 02/01/2007   Qualifier: Diagnosis of  By: Burnice Logan  MD, Doretha Sou    DIARRHEA, CHRONIC 08/20/2009   Qualifier: Diagnosis of  By: Burnice Logan  MD, Doretha Sou    DM 02/01/2007   Qualifier: Diagnosis of  By: Floyde Parkins     FATIGUE 10/20/2008   Qualifier: Diagnosis of  By: Burnice Logan  MD, Doretha Sou    HYPERCHOLESTEROLEMIA 02/01/2007   Qualifier: Diagnosis of  By: Mariam Dollar 12/20/2007   Qualifier: Diagnosis of  By: Burnice Logan  MD, Doretha Sou    HYPERTENSION 02/01/2007   Qualifier: Diagnosis of  By: Russella Dar BACK PAIN 01/13/2010   Qualifier: Diagnosis of  By: Burnice Logan  MD, Doretha Sou    MUSCLE CRAMPS 04/06/2010   Qualifier: Diagnosis of  By: Burnice Logan  MD, Doretha Sou    MYALGIA 01/20/2010   Qualifier: Diagnosis of  By: Elease Hashimoto MD, Bruce     OSTEOARTHRITIS 01/13/2010   Qualifier: Diagnosis of  By: Burnice Logan  MD, Doretha Sou    OVERACTIVE BLADDER 08/20/2009   Qualifier: Diagnosis of  By: Burnice Logan  MD, Doretha Sou    PLEURISY 06/18/2008   Qualifier: Diagnosis of  By: Niel Hummer MD, Lorinda Creed    PNEUMONIA 06/18/2008   Qualifier: Diagnosis of  By: Niel Hummer MD, Lorinda Creed    Rectal prolapse 09/20/2007   Qualifier: Diagnosis of  By: Burnice Logan  MD, Doretha Sou    VITAMIN B12 DEFICIENCY  07/20/2010   Qualifier: Diagnosis of  By: Carlean Purl MD, Dimas Millin     Social History   Socioeconomic History   Marital status: Single    Spouse name: Not on file   Number of children: Not on file   Years of education: Not on file   Highest education level: Not on file  Occupational History   Not on file  Tobacco Use   Smoking status: Former    Packs/day: 10.00    Types: Cigarettes   Smokeless tobacco: Never   Tobacco comments:    quit > 57 yo   Vaping Use   Vaping Use: Never used  Substance and Sexual Activity   Alcohol use: No   Drug use: No   Sexual activity: Not on file  Other Topics Concern   Not on file  Social History  Narrative   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    Married for 39 years, widowed for 22 years    Right handed    Lives alone   Social Determinants of Health   Financial Resource Strain: Low Risk    Difficulty of Paying Living Expenses: Not hard at all  Food Insecurity: No Food Insecurity   Worried About Charity fundraiser in the Last Year: Never true   [place] in the Last Year: Never true  Transportation Needs: No Transportation Needs   Lack of Transportation (Medical): No   Lack of Transportation (Non-Medical): No  Physical Activity: Inactive   Days of Exercise per Week: 0 days   Minutes of Exercise per Session: 0 min  Stress: No Stress Concern Present   Feeling of Stress : Not at all  Social Connections: Moderately Integrated   Frequency of Communication with Friends and Family: More than three times a week   Frequency of Social Gatherings with Friends and Family: More than three times a week   Attends Religious Services: More than 4 times per year   Active Member of [employer] or Organizations: Yes   Attends Archivist Meetings: More than 4 times per year   Marital Status: Widowed  Human resources officer Violence: Not At Risk   Fear of Current or Ex-Partner: No   Emotionally Abused: No   Physically Abused: No   Sexually Abused: No    Past Surgical History:  Procedure Laterality Date   ABDOMINAL HYSTERECTOMY     partial- states she has her ovaries   APPENDECTOMY     BLADDER SURGERY     CATARACT EXTRACTION Bilateral 2012   CHOLECYSTECTOMY      Family History  Problem Relation Age of Onset   Deep vein thrombosis Mother    COPD Father    Asthma Father    Diabetes Neg Hx        family hx   Cancer Neg Hx        breast ca - sister(S)?   Heart disease Neg Hx        family hx    Allergies  Allergen Reactions   Metformin     REACTION: diarrhea   Sitagliptin Phosphate     REACTION: abdominal pain    Current Outpatient Medications on File Prior to Visit  Medication Sig  Dispense Refill   Acetaminophen 650 MG TABS Take 1 tablet (650 mg total) by mouth 3 (three) times daily as needed. 30 tablet 0   aspirin 81 MG tablet Take 81 mg by mouth daily.     Blood Glucose Calibration (ACCU-CHEK GUIDE  CONTROL) LIQD Use as directed 1 each 1   Blood Glucose Monitoring Suppl (ACCU-CHEK AVIVA PLUS) w/Device KIT Use to check blood sugar 3 times a day 1 kit 0   Blood Glucose Monitoring Suppl (ACCU-CHEK GUIDE) w/Device KIT Use as directed 1 kit 0   calcium-vitamin D (OSCAL WITH D 500-200) 500-200 MG-UNIT per tablet Take 1 tablet by mouth daily.     Continuous Blood Gluc Sensor (FREESTYLE LIBRE 14 DAY SENSOR) MISC Use to check blood glucose 3 times a day 6 each 3   Dulaglutide (TRULICITY) 3 HE/1.7EY SOPN Inject 3 mg into the skin once a week. 6 mL 3   fluticasone (FLONASE) 50 MCG/ACT nasal spray Place 1 spray into the nose daily. 16 g 0   Glucagon 3 MG/DOSE POWD Place 3 mg into the nose once as needed for up to 1 dose. 1 each 11   glucose blood (ACCU-CHEK AVIVA PLUS) test strip Use to check blood sugar 3 times a day. 300 strip 12   glucose blood (ACCU-CHEK GUIDE) test strip CHECK BLOOD SUGAR THREE TIMES DAILY 100 strip 0   insulin NPH Human (NOVOLIN N) 100 UNIT/ML injection Inject 0.24 mLs (24 Units total) into the skin daily before breakfast. Pens please 30 mL 3   Insulin Pen Needle (BD PEN NEEDLE NANO U/F) 32G X 4 MM MISC USE TO INJECT INSULIN 4 TIMES DAILY AS DIRECTED. 200 each 3   Lancets (ONETOUCH ULTRASOFT) lancets Use as instructed to test 3 times daily 300 each 5   Multiple Vitamin (MULTIVITAMIN) tablet Take 1 tablet by mouth daily.     NOVOLOG FLEXPEN 100 UNIT/ML FlexPen INJECT 20 TO  30 UNITS SUBCUTANEOUSLY THREE TIMES DAILY WITH MEALS 90 mL 1   simvastatin (ZOCOR) 40 MG tablet TAKE 1 TABLET EVERY DAY 90 tablet 2   trazodone (DESYREL) 300 MG tablet TAKE 1 TABLET AT BEDTIME 90 tablet 1   venlafaxine XR (EFFEXOR-XR) 75 MG 24 hr capsule TAKE 1 CAPSULE EVERY DAY 90 capsule 1    No current facility-administered medications on file prior to visit.    BP 140/70   Pulse 88   Temp 98.3 F (36.8 C) (Oral)   Ht _0  (1.6 m)   Wt 158 lb (71.7 kg)   SpO2 100%   BMI 27.99 kg/m       Objective:   Physical Exam Vitals and nursing note reviewed.  Constitutional:      Appearance: Normal appearance.  Musculoskeletal:        General: Normal range of motion.  Skin:    General: Skin is warm and dry.  Neurological:     General: No focal deficit present.     Mental Status: She is alert. Mental status is at baseline.  Psychiatric:        Attention and Perception: Attention normal.        Mood and Affect: Mood normal.        Speech: Speech normal.        Behavior: Behavior normal.      Assessment & Plan:  1. Cognitive impairment -Unstable. There have been some changes in cognition. Medication management is done y Neurology.  Family is looking for placement into a memory care unit.  I explained the natural course of vascular dementia to her son and herself.  I advised her that I did not think it was safe for her to be driving any longer as I feared for her safety and the safety of others on  the road.  She seemed to grasp this concept and was in agreement, to some degree.  She understands that she has a supportive family who will take her wherever she needs to be taken.  She will no longer drive  2. Need for immunization against influenza  - Flu Vaccine QUAD High Dose(Fluad)  Dorothyann Peng, NP   Time spent on chart review, time with patient; discussion of dementia and the need for her not to drive any longer,  home monitoring, treatment, follow up plan, and documentation 30 minutes

## 2021-04-30 NOTE — Telephone Encounter (Signed)
FYI just received this message.

## 2021-05-17 DIAGNOSIS — H52203 Unspecified astigmatism, bilateral: Secondary | ICD-10-CM | POA: Diagnosis not present

## 2021-05-17 DIAGNOSIS — E119 Type 2 diabetes mellitus without complications: Secondary | ICD-10-CM | POA: Diagnosis not present

## 2021-05-17 DIAGNOSIS — Z961 Presence of intraocular lens: Secondary | ICD-10-CM | POA: Diagnosis not present

## 2021-05-17 LAB — HM DIABETES EYE EXAM

## 2021-05-18 ENCOUNTER — Encounter: Payer: Self-pay | Admitting: Internal Medicine

## 2021-05-23 NOTE — Progress Notes (Signed)
Assessment/Plan:    Vascular dementia with behavioral disturbance  Recommendations:   Discussed safety both in and out of the home.  Recommend sitter and adult day programs to increase activity with close supervision Discussed the importance of regular daily schedule with inclusion of crossword puzzles to maintain brain function.  Continue to monitor mood by PCP Naps should be scheduled and should be no longer than 60 minutes and should not occur after 2 PM.  Mediterranean diet is recommended  Recommend close monitoring of her cardiovascular risk factors Follow up in 6 months.   Case discussed with Dr. Delice Lesch who agrees with the plan     Subjective:   [name] [name] is a 87 y.o. RH female with a history of DM2 with secondary nephropathy, chronic diarrhea, vitamin B12 deficiency, osteoarthritis, overactive bladder, hypertension, seen today in follow up for memory loss.  She was initially seen on 09/09/2020 at which time MoCA was 16/30. MRI without contrast on 09/05/2020 showed 2 subcentimeter areas of weakly restricted diffusion in the left centrum semiovale and right for mental white matter due to small vessel infarcts in the setting of extensive chronic microvascular disease.  It was suspected that dementia was likely due to extensive chronic microvascular disease.  This patient is accompanied in the office by her who supplements the history.  Previous records as well as any outside records available were reviewed prior to todays visit.  No medications are indicated, as the side effects likely outweigh the potential benefits, which the patient and her son agreed with.  In today's visit, she reports that her memory is "coming and going ", she has "good and bad days and on a bad day, I can forget everything ".  For example, she may have difficulties starting the TV, or trying to figure out how to answer the phone.  She denies repeating the same sentences or asking the same questions.  She  denies coming into her room and forgetting what she went there for.  She reports that her short-term is better than her long-term memory.  She denies leaving objects in unusual places.  Most of the day, she spends it at home, with her son visiting her, and bringing her food as the patient does not cook anymore.  He is also in charge of the medications and the driving.  She admits that before she would do a lot more, including crossword puzzles, or word finding, which she has not been doing lately.  Her son states that she sleeps more than before, sometimes up to noon.  When asked about it, she states that she does not have much to do, and that she is bored, and that is when she sleeps that long.  She denies sleepwalking, or REM behavior.  She denies hallucinations or paranoia.  She has been somewhat depressed, and takes Effexor with good response.  For sleeping she takes trazodone as per PCP.  No hygiene concerns, she is independent of bathing and dressing.  Her son takes care of the finances.  Her appetite is good, denies trouble swallowing.  She ambulates at times with a cane, unless she forgets it when she goes with her son.  She does not use a walker or a cane inside the house.  She denies any falls, or head injuries. Denies headaches, double vision, dizziness, focal numbness or tingling, unilateral weakness or tremors or anosmia. No history of seizures.  She has a history of urine incontinence, and uses diapers.  She has  intermittent diarrhea.   HISTORY OF PRESENT ILLNESS 10/19/20: This is an 87 year old right-handed woman with a history of hyperlipidemia, hypertension, diabetes, osteoarthritis, overactive bladder, B12 deficiency, chronic diarrhea, anxiety, presenting for evaluation of memory impairment. She was also having frequent falls. She fell in December, slipping on wet leaves, hitting her head on the ground. She fell in February getting out of bed and hitting her face on the floor. Since the falls,  family noticed decline in cognitive function. She will forget she is making food on the stove and walk away. She would not remember calling family members. She had continued to drive and denied getting lost. MMSE 20/30 at PCP office in 08/2020. She had a brain MRI without contrast on 09/05/20 which showed 2 subcentimeter areas of weakly restricted diffusion in the left centrum semiovale and right frontal white matter best attributed to small vessel infarcts in the setting of extensive chronic microvascular disease.    She feels her memory is okay. She lives alone. Her son has taken over bill payments, she tries to write them but had missed payments and double paid some. She forget her medications, her other son fixes her medications but she forgets what she has taken or what she is supposed to take. She denies leaving the stove on but family notes she would leave food out on the stove and would be there for a couple of days (not new). [name] [name] started noticing memory changes around December, they could tell her memory had decreased but she would make light of it. She has always been one who "if she does not want to do it, she would not do it." Family would tried to call her to remind her about her medications but she would not answer the phone. Her other son checks her glucose levels but she does not eat like she is supposed to, she like sweets. Glucose levels are labile, yesterday it was 44, then it was 240 this morning. Family had her stop driving 2 days ago, as far as they know she has not gotten lost but there have been a few dings on the car lately. She is independent with dressing and bathing. Family reports she lays all her laundry on the bed, which is not new. Since December, family also noticed more personality changes, she would do or say things out of character, no paranoia or hallucinations. [name] [name] thinks she has some depression, he can see a mood change. For instance, her grandchildren visited but she did not  carry on conversations and spent more time with the dog. She is moody when she does not feel good, and does not feel good a lot of times saying she is tired. She had a UTI a couple of years ago and had delirium.    She denies any headaches, dizziness, diplopia, dysarthria/dysphagia, neck/back pain, focal numbness/tingling/weakness, anosmia, or tremors. She has bowel incontinence and has started wearing adult diapers. She has always had loose stools but today had an accident in the building just prior to her appointment. She has more control with urination. She does not sleep well, she is on Trazodone and takes naps. She had a fall last month getting out of bed, then another fall last Saturday, She now has a cane and walker.     PREVIOUS MEDICATIONS:   CURRENT MEDICATIONS:  Outpatient Encounter Medications as of 05/24/2021  Medication Sig   Acetaminophen 650 MG TABS Take 1 tablet (650 mg total) by mouth 3 (three) times daily as  needed.   aspirin 81 MG tablet Take 81 mg by mouth daily.   Blood Glucose Calibration (ACCU-CHEK GUIDE CONTROL) LIQD Use as directed   Blood Glucose Monitoring Suppl (ACCU-CHEK AVIVA PLUS) w/Device KIT Use to check blood sugar 3 times a day   Blood Glucose Monitoring Suppl (ACCU-CHEK GUIDE) w/Device KIT Use as directed   calcium-vitamin D (OSCAL WITH D 500-200) 500-200 MG-UNIT per tablet Take 1 tablet by mouth daily.   Continuous Blood Gluc Sensor (FREESTYLE LIBRE 14 DAY SENSOR) MISC Use to check blood glucose 3 times a day   Dulaglutide (TRULICITY) 3 ZO/1.0RU SOPN Inject 3 mg into the skin once a week.   fluticasone (FLONASE) 50 MCG/ACT nasal spray Place 1 spray into the nose daily.   Glucagon 3 MG/DOSE POWD Place 3 mg into the nose once as needed for up to 1 dose.   glucose blood (ACCU-CHEK AVIVA PLUS) test strip Use to check blood sugar 3 times a day.   glucose blood (ACCU-CHEK GUIDE) test strip CHECK BLOOD SUGAR THREE TIMES DAILY   insulin NPH Human (NOVOLIN N) 100  UNIT/ML injection Inject 0.24 mLs (24 Units total) into the skin daily before breakfast. Pens please   Insulin Pen Needle (BD PEN NEEDLE NANO U/F) 32G X 4 MM MISC USE TO INJECT INSULIN 4 TIMES DAILY AS DIRECTED.   Lancets (ONETOUCH ULTRASOFT) lancets Use as instructed to test 3 times daily   Multiple Vitamin (MULTIVITAMIN) tablet Take 1 tablet by mouth daily.   NOVOLOG FLEXPEN 100 UNIT/ML FlexPen INJECT 20 TO  30 UNITS SUBCUTANEOUSLY THREE TIMES DAILY WITH MEALS   simvastatin (ZOCOR) 40 MG tablet TAKE 1 TABLET EVERY DAY   trazodone (DESYREL) 300 MG tablet TAKE 1 TABLET AT BEDTIME   venlafaxine XR (EFFEXOR-XR) 75 MG 24 hr capsule TAKE 1 CAPSULE EVERY DAY   No facility-administered encounter medications on file as of 05/24/2021.     Objective:     PHYSICAL EXAMINATION:    VITALS:   Vitals:   05/24/21 1430  Weight: 173 lb (78.5 kg)  Height: 5' 4"  (1.626 m)    GEN:  The patient appears stated age and is in NAD. HEENT:  Normocephalic, atraumatic.   Neurological examination:  General: NAD, well-groomed, appears stated age.  Flat affect Orientation: The patient is alert. Oriented to person, place and not to date. Cranial nerves: There is good facial symmetry.The speech is fluent and clear. No aphasia or dysarthria. Fund of knowledge is appropriate. Recent and remote memory are impaired. Attention and concentration are reduced.  Able to name objects and repeat phrases.  Hearing is intact to conversational tone.    Sensation: Sensation is intact to light touch throughout Motor: Strength is at least antigravity x4. Tremors: none  DTR's `1/4 in [place] Cognitive Assessment  09/09/2020  Visuospatial/ Executive (0/5) 2  Naming (0/3) 2  Attention: Read list of digits (0/2) 2  Attention: Read list of letters (0/1) 0  Attention: Serial 7 subtraction starting at 100 (0/3) 1  Language: Repeat phrase (0/2) 1  Language : Fluency (0/1) 0  Abstraction (0/2) 2  Delayed Recall (0/5)  0  Orientation (0/6) 5  Total 15  Adjusted Score (based on education) 16   MMSE - [place] Exam 08/21/2020 05/05/2016  Not completed: - (No Data)  Orientation to time 3 -  Orientation to Place 4 -  Registration 3 -  Attention/ Calculation 2 -  Recall 0 -  Language- name 2  objects 2 -  Language- repeat 1 -  Language- follow 3 step command 3 -  Language- read & follow direction 1 -  Write a sentence 1 -  Copy design 0 -  Total score 20 -    No flowsheet data found.     Movement examination: Tone: There is normal tone in the UE/LE Abnormal movements:  no tremor.  No myoclonus.  No asterixis.   Coordination:  There is no decremation with RAM's. Normal finger to nose  Gait and Station: The patient has no difficulty arising out of a deep-seated chair without the use of the hands. The patient's stride length is good.  Gait is cautious and narrow.     Total time spent on today's visit was minutes, including both face-to-face time and nonface-to-face time. Time included that spent on review of records (prior notes available to me/labs/imaging if pertinent), discussing treatment and goals, answering patient's questions and coordinating care.  Cc:  Dorothyann Peng, NP Sharene Butters, PA-C

## 2021-05-24 ENCOUNTER — Other Ambulatory Visit: Payer: Self-pay

## 2021-05-24 ENCOUNTER — Ambulatory Visit: Payer: Medicare HMO | Admitting: Neurology

## 2021-05-24 ENCOUNTER — Encounter: Payer: Self-pay | Admitting: Physician Assistant

## 2021-05-24 ENCOUNTER — Ambulatory Visit: Payer: Medicare HMO | Admitting: Physician Assistant

## 2021-05-24 VITALS — Ht 64.0 in | Wt 173.0 lb

## 2021-05-24 DIAGNOSIS — F01518 Vascular dementia, unspecified severity, with other behavioral disturbance: Secondary | ICD-10-CM

## 2021-05-24 NOTE — Patient Instructions (Signed)
1. Follow-up with your endocrinologist as scheduled  2. Continue close supervision  3. Follow-up in 6-8 months, call for any changes   FALL PRECAUTIONS: Be cautious when walking. Scan the area for obstacles that may increase the risk of trips and falls. When getting up in the mornings, sit up at the edge of the bed for a few minutes before getting out of bed. Consider elevating the bed at the head end to avoid drop of blood pressure when getting up. Walk always in a well-lit room (use night lights in the walls). Avoid area rugs or power cords from appliances in the middle of the walkways. Use a walker or a cane if necessary and consider physical therapy for balance exercise. Get your eyesight checked regularly.  FINANCIAL OVERSIGHT: Supervision, especially oversight when making financial decisions or transactions is also recommended.  HOME SAFETY: Consider the safety of the kitchen when operating appliances like stoves, microwave oven, and blender. Consider having supervision and share cooking responsibilities until no longer able to participate in those. Accidents with firearms and other hazards in the house should be identified and addressed as well.   ABILITY TO BE LEFT ALONE: If patient is unable to contact 911 operator, consider using LifeLine, or when the need is there, arrange for someone to stay with patients. Smoking is a fire hazard, consider supervision or cessation. Risk of wandering should be assessed by caregiver and if detected at any point, supervision and safe proof recommendations should be instituted.  MEDICATION SUPERVISION: Inability to self-administer medication needs to be constantly addressed. Implement a mechanism to ensure safe administration of the medications.  RECOMMENDATIONS FOR ALL PATIENTS WITH MEMORY PROBLEMS: 1. Continue to exercise (Recommend 30 minutes of walking everyday, or 3 hours every week) 2. Increase social interactions - continue going to [place] and  enjoy social gatherings with friends and family 3. Eat healthy, avoid fried foods and eat more fruits and vegetables 4. Maintain adequate blood pressure, blood sugar, and blood cholesterol level. Reducing the risk of stroke and cardiovascular disease also helps promoting better memory. 5. Avoid stressful situations. Live a simple life and avoid aggravations. Organize your time and prepare for the next day in anticipation. 6. Sleep well, avoid any interruptions of sleep and avoid any distractions in the bedroom that may interfere with adequate sleep quality 7. Avoid sugar, avoid sweets as there is a strong link between excessive sugar intake, diabetes, and cognitive impairment We discussed the Mediterranean diet, which has been shown to help patients reduce the risk of progressive memory disorders and reduces cardiovascular risk. This includes eating fish, eat fruits and green leafy vegetables, nuts like almonds and hazelnuts, walnuts, and also use olive oil. Avoid fast foods and fried foods as much as possible. Avoid sweets and sugar as sugar use has been linked to worsening of memory function.  There is always a concern of gradual progression of memory problems. If this is the case, then we may need to adjust level of care according to patient needs. Support, both to the patient and caregiver, should then be put into place.

## 2021-05-31 DIAGNOSIS — E1121 Type 2 diabetes mellitus with diabetic nephropathy: Secondary | ICD-10-CM | POA: Diagnosis not present

## 2021-06-07 ENCOUNTER — Telehealth: Payer: Self-pay | Admitting: Internal Medicine

## 2021-06-07 NOTE — Telephone Encounter (Signed)
Pt Son called concerning Pt high BS. Per the son it range from [phone]. He request a call by at [phone] ASAP.

## 2021-06-07 NOTE — Telephone Encounter (Signed)
Called and left a message on pt's home and son's [name] [name]) cell phone for a call back to discuss pt's high blood sugars.

## 2021-06-11 ENCOUNTER — Encounter: Payer: Self-pay | Admitting: Internal Medicine

## 2021-06-11 ENCOUNTER — Ambulatory Visit (INDEPENDENT_AMBULATORY_CARE_PROVIDER_SITE_OTHER): Payer: Medicare HMO | Admitting: Internal Medicine

## 2021-06-11 ENCOUNTER — Other Ambulatory Visit: Payer: Self-pay

## 2021-06-11 VITALS — BP 122/70 | HR 105 | Ht 64.0 in | Wt 156.2 lb

## 2021-06-11 DIAGNOSIS — E1121 Type 2 diabetes mellitus with diabetic nephropathy: Secondary | ICD-10-CM | POA: Diagnosis not present

## 2021-06-11 DIAGNOSIS — E663 Overweight: Secondary | ICD-10-CM

## 2021-06-11 DIAGNOSIS — E7800 Pure hypercholesterolemia, unspecified: Secondary | ICD-10-CM | POA: Diagnosis not present

## 2021-06-11 DIAGNOSIS — Z794 Long term (current) use of insulin: Secondary | ICD-10-CM | POA: Diagnosis not present

## 2021-06-11 LAB — COMPREHENSIVE METABOLIC PANEL
ALT: 22 U/L (ref 0–35)
AST: 35 U/L (ref 0–37)
Albumin: 3.9 g/dL (ref 3.5–5.2)
Alkaline Phosphatase: 50 U/L (ref 39–117)
BUN: 29 mg/dL — ABNORMAL HIGH (ref 6–23)
CO2: 28 mEq/L (ref 19–32)
Calcium: 9.2 mg/dL (ref 8.4–10.5)
Chloride: 100 mEq/L (ref 96–112)
Creatinine, Ser: 1.46 mg/dL — ABNORMAL HIGH (ref 0.40–1.20)
GFR: 31.61 mL/min — ABNORMAL LOW (ref >60.00)
Glucose, Bld: 263 mg/dL — ABNORMAL HIGH (ref 70–99)
Potassium: 4.5 mEq/L (ref 3.5–5.1)
Sodium: 137 mEq/L (ref 135–145)
Total Bilirubin: 0.5 mg/dL (ref 0.2–1.2)
Total Protein: 7.1 g/dL (ref 6.0–8.3)

## 2021-06-11 LAB — LIPID PANEL
Cholesterol: 186 mg/dL (ref 0–200)
HDL: 54.2 mg/dL (ref >39.00)
NonHDL: 132.1
Total CHOL/HDL Ratio: 3
Triglycerides: 259 mg/dL — ABNORMAL HIGH (ref 0.0–149.0)
VLDL: 51.8 mg/dL — ABNORMAL HIGH (ref 0.0–40.0)

## 2021-06-11 LAB — LDL CHOLESTEROL, DIRECT: Direct LDL: 106 mg/dL

## 2021-06-11 LAB — POCT GLYCOSYLATED HEMOGLOBIN (HGB A1C): Hemoglobin A1C: 9.5 % — AB (ref 4.0–5.6)

## 2021-06-11 MED ORDER — INSULIN NPH (HUMAN) (ISOPHANE) 100 UNIT/ML ~~LOC~~ SUSP: 28.0000 [IU] | Freq: Every day | SUBCUTANEOUS | 3 refills | Status: DC

## 2021-06-11 MED ORDER — TRULICITY 3 MG/0.5ML ~~LOC~~ SOAJ: 3.0000 mg | SUBCUTANEOUS | 3 refills | Status: AC

## 2021-06-11 MED ORDER — NOVOLOG FLEXPEN 100 UNIT/ML ~~LOC~~ SOPN: PEN_INJECTOR | SUBCUTANEOUS | 3 refills | Status: DC

## 2021-06-11 NOTE — Progress Notes (Signed)
Patient ID: [name] [name], female   DOB: 1929-04-09, 87 y.o.   MRN: [id number]   This visit occurred during the SARS-CoV-2 public health emergency.  Safety protocols were in place, including screening questions prior to the visit, additional usage of staff PPE, and extensive cleaning of exam room while observing appropriate contact time as indicated for disinfecting solutions.   HPI: [name] [name] is a 87 y.o.-year-old female, returning for f/u for DM2, dx 2005, insulin-dependent, uncontrolled, with complications (nephropathy). Last visit 5.5 months ago.   She is accompanied by her son  who offers most of the medical information as patient is mostly nonverbal. She has Brewing technologist.  Interim history: No increased urination, blurry vision, nausea, chest pain. She was missing insulin and Trulicity doses at last visit.  At this visit, her son tells me he is giving her the Novolog mostly after the meal. Sugars have been very high: 545 = highest. She was drinking sweet tea >> now stopped.  Reviewed HbA1c levels: Lab Results  Component Value Date   HGBA1C 10.4 (A) 12/25/2020   HGBA1C 6.9 (A) 09/11/2020   HGBA1C 7.3 (A) 12/12/2019   HGBA1C 7.4 (A) 08/15/2019   HGBA1C 7.3 (A) 04/15/2019   HGBA1C 7.2 11/15/2018   HGBA1C 6.6 (A) 07/16/2018   HGBA1C 6.6 (A) 03/15/2018   HGBA1C 7.2 (A) 12/12/2017   HGBA1C 8.4% 10/05/2017   Pt is on a regimen of: - Trulicity 1.5 >> 3 mg weekly - skipped one dose - NPH 12 >> 20 >> 24 units in am  - NovoLog 6-8 >>  8-10 units before meals We had to stop Metformin 2/2 increased creatinine. Amaryl was stopped by PCP due to hypoglycemia 09/09/2020. Previously on [employer].  Pt.checks her sugars more than 4 times a day with her CGM:   Previously:   Previously:   Lowest sugar was 44 >> 175 >> 160 >> 70s. She has hypoglycemia awareness in the 70s. Highest 435 >> 400s >> 500s. In 2021, she had low blood sugar episodes, lowest being at 44, when she  had a seizure.  She also had several falls.  She does have vascular dementia with behavioral disturbance with small subacute infarcts and chronic small vessel disease changes on MRI from 09/05/2020.  She does see neurology.    + CKD stage III; last BUN/Cr: Lab Results  Component Value Date   BUN 23 08/21/2020   CREATININE 1.61 (H) 08/21/2020   + HL; last set of lipids: Lab Results  Component Value Date   CHOL 162 04/08/2020   HDL 60 04/08/2020   LDLCALC 65 04/08/2020   LDLDIRECT 112.0 12/27/2018   TRIG 284 (H) 04/08/2020   CHOLHDL 2.7 04/08/2020  On simvastatin 40.  Pt's last eye exam was: 05/17/2021: No DR - Dr. Susa Loffler at [facility].   She denies numbness and tingling in her feet  ROS: + see HPI  I reviewed pt's medications, allergies, PMH, social hx, family hx, and changes were documented in the history of present illness. Otherwise, unchanged from my initial visit note.  Past Medical History:  Diagnosis Date   Asthma    DEGENERATIVE JOINT DISEASE, GENERALIZED 02/01/2007   Qualifier: Diagnosis of  By: Burnice Logan  MD, Doretha Sou    DIARRHEA, CHRONIC 08/20/2009   Qualifier: Diagnosis of  By: Burnice Logan  MD, Doretha Sou    DM 02/01/2007   Qualifier: Diagnosis of  By: Floyde Parkins     FATIGUE 10/20/2008   Qualifier: Diagnosis  of  By: Burnice Logan  MD, Doretha Sou    HYPERCHOLESTEROLEMIA 02/01/2007   Qualifier: Diagnosis of  By: Mariam Dollar 12/20/2007   Qualifier: Diagnosis of  By: Burnice Logan  MD, Doretha Sou    HYPERTENSION 02/01/2007   Qualifier: Diagnosis of  By: Floyde Parkins     LOW BACK PAIN 01/13/2010   Qualifier: Diagnosis of  By: Burnice Logan  MD, Doretha Sou    MUSCLE CRAMPS 04/06/2010   Qualifier: Diagnosis of  By: Burnice Logan  MD, Doretha Sou    MYALGIA 01/20/2010   Qualifier: Diagnosis of  By: Elease Hashimoto MD, Bruce     OSTEOARTHRITIS 01/13/2010   Qualifier: Diagnosis of  By: Burnice Logan  MD, Doretha Sou    OVERACTIVE BLADDER 08/20/2009    Qualifier: Diagnosis of  By: Burnice Logan  MD, Doretha Sou    PLEURISY 06/18/2008   Qualifier: Diagnosis of  By: Niel Hummer MD, Lorinda Creed    PNEUMONIA 06/18/2008   Qualifier: Diagnosis of  By: Niel Hummer MD, Willie R    Rectal prolapse 09/20/2007   Qualifier: Diagnosis of  By: Burnice Logan  MD, Doretha Sou    VITAMIN B12 DEFICIENCY 07/20/2010   Qualifier: Diagnosis of  By: Carlean Purl MD, Dimas Millin    Past Surgical History:  Procedure Laterality Date   ABDOMINAL HYSTERECTOMY     partial- states she has her ovaries   APPENDECTOMY     BLADDER SURGERY     CATARACT EXTRACTION Bilateral 2012   CHOLECYSTECTOMY     Social History   Socioeconomic History   Marital status: Single    Spouse name: Not on file   Number of children: Not on file   Years of education: Not on file   Highest education level: Not on file  Occupational History   Not on file  Tobacco Use   Smoking status: Former    Packs/day: 10.00    Types: Cigarettes   Smokeless tobacco: Never   Tobacco comments:    quit > 14 yo   Vaping Use   Vaping Use: Never used  Substance and Sexual Activity   Alcohol use: No   Drug use: No   Sexual activity: Not on file  Other Topics Concern   Not on file  Social History Narrative   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    Married for 88 years, widowed for 22 years    Right handed    Lives alone   Social Determinants of Health   Financial Resource Strain: Low Risk    Difficulty of Paying Living Expenses: Not hard at all  Food Insecurity: No Food Insecurity   Worried About Running Out of Food in the Last Year: Never true   Ran Out of Food in the Last Year: Never true  Transportation Needs: No Transportation Needs   Lack of Transportation (Medical): No   Lack of Transportation (Non-Medical): No  Physical Activity: Inactive   Days of Exercise per Week: 0 days   Minutes of Exercise per Session: 0 min  Stress: No Stress Concern Present   Feeling of Stress : Not at all  Social Connections: Moderately  Integrated   Frequency of Communication with Friends and Family: More than three times a week   Frequency of Social Gatherings with Friends and Family: More than three times a week   Attends Religious Services: More than 4 times per year   Active Member of [employer] or Organizations: Yes   Attends Archivist Meetings: More than  4 times per year   Marital Status: Widowed  Human resources officer Violence: Not At Risk   Fear of Current or Ex-Partner: No   Emotionally Abused: No   Physically Abused: No   Sexually Abused: No   Current Outpatient Medications on File Prior to Visit  Medication Sig Dispense Refill   Acetaminophen 650 MG TABS Take 1 tablet (650 mg total) by mouth 3 (three) times daily as needed. 30 tablet 0   aspirin 81 MG tablet Take 81 mg by mouth daily.     Blood Glucose Calibration (ACCU-CHEK GUIDE CONTROL) LIQD Use as directed 1 each 1   Blood Glucose Monitoring Suppl (ACCU-CHEK AVIVA PLUS) w/Device KIT Use to check blood sugar 3 times a day 1 kit 0   Blood Glucose Monitoring Suppl (ACCU-CHEK GUIDE) w/Device KIT Use as directed 1 kit 0   calcium-vitamin D (OSCAL WITH D 500-200) 500-200 MG-UNIT per tablet Take 1 tablet by mouth daily.     Continuous Blood Gluc Sensor (FREESTYLE LIBRE 14 DAY SENSOR) MISC Use to check blood glucose 3 times a day 6 each 3   Dulaglutide (TRULICITY) 3 IO/9.6EX SOPN Inject 3 mg into the skin once a week. 6 mL 3   fluticasone (FLONASE) 50 MCG/ACT nasal spray Place 1 spray into the nose daily. 16 g 0   Glucagon 3 MG/DOSE POWD Place 3 mg into the nose once as needed for up to 1 dose. 1 each 11   glucose blood (ACCU-CHEK AVIVA PLUS) test strip Use to check blood sugar 3 times a day. 300 strip 12   glucose blood (ACCU-CHEK GUIDE) test strip CHECK BLOOD SUGAR THREE TIMES DAILY 100 strip 0   insulin NPH Human (NOVOLIN N) 100 UNIT/ML injection Inject 0.24 mLs (24 Units total) into the skin daily before breakfast. Pens please 30 mL 3   Insulin Pen Needle (BD  PEN NEEDLE NANO U/F) 32G X 4 MM MISC USE TO INJECT INSULIN 4 TIMES DAILY AS DIRECTED. 200 each 3   Lancets (ONETOUCH ULTRASOFT) lancets Use as instructed to test 3 times daily 300 each 5   Multiple Vitamin (MULTIVITAMIN) tablet Take 1 tablet by mouth daily.     NOVOLOG FLEXPEN 100 UNIT/ML FlexPen INJECT 20 TO  30 UNITS SUBCUTANEOUSLY THREE TIMES DAILY WITH MEALS 90 mL 1   simvastatin (ZOCOR) 40 MG tablet TAKE 1 TABLET EVERY DAY 90 tablet 2   trazodone (DESYREL) 300 MG tablet TAKE 1 TABLET AT BEDTIME 90 tablet 1   venlafaxine XR (EFFEXOR-XR) 75 MG 24 hr capsule TAKE 1 CAPSULE EVERY DAY 90 capsule 1   No current facility-administered medications on file prior to visit.   Allergies  Allergen Reactions   Metformin     REACTION: diarrhea   Sitagliptin Phosphate     REACTION: abdominal pain   Family History  Problem Relation Age of Onset   Deep vein thrombosis Mother    COPD Father    Asthma Father    Diabetes Neg Hx        family hx   Cancer Neg Hx        breast ca - sister(S)?   Heart disease Neg Hx        family hx   PE: BP 122/70 (BP Location: Left Arm, Patient Position: Sitting, Cuff Size: Normal)   Pulse (!) 105   Ht 5' 4"  (1.626 m)   Wt 156 lb 3.2 oz (70.9 kg)   SpO2 96%   BMI 26.81 kg/m   Wt  Readings from Last 3 Encounters:  06/11/21 156 lb 3.2 oz (70.9 kg)  05/24/21 173 lb (78.5 kg)  04/30/21 158 lb (71.7 kg)   Constitutional: overweight, in NAD Eyes: PERRLA, EOMI, no exophthalmos ENT: moist mucous membranes, no thyromegaly, no cervical lymphadenopathy Cardiovascular: tachycardia, RR, No MRG Respiratory: CTA B Musculoskeletal: no deformities, strength intact in all 4 Skin: moist, warm, no rashes Neurological: no tremor with outstretched hands, DTR normal in all 4  ASSESSMENT: 1. DM2, insulin-dependent, uncontrolled, with complications -Severe hypoglycemia complicated by seizure - + nephropathy  We have tried to stop Amaryl, but sugars were higher >> we  restarted it >> stopped again 09/16/2020.  2. HL  3.  Overweight  PLAN:  1. Patient with longstanding, uncontrolled, insulin-dependent diabetes, on basal-bolus insulin regimen and weekly GLP-1 receptor agonist, with her diabetes control impaired by vascular dementia and forgetting insulin doses.  She also has a history of hypoglycemia episodes in the past.  At last visit, reviewing the CGM trends, sugars are higher than target, with slightly lower values overnight, decreasing after dinner but with increasing blood sugars immediately after she was waking up.  The highest blood sugars were after lunch and after dinner.  Upon questioning, patient's son continues to give her 12 units of NPH, rather than creasing the dose as just in the previous visit.  I again advised him to increase the NPH dose.  Also, we discussed about the importance of not forgetting NovoLog and to take 15 minutes before meals, he states he was giving it to her after the meals.  We also increased the dose of NovoLog at that time.  She missed Trulicity doses before last visit, also, I advised them to send Alexa to remind the patient about taking this and also the insulin doses.  At that time, HbA1c was much higher, at 10.4%. -Since last visit, she contacted the past with very high blood sugars CGM interpretation: -At today's visit, we reviewed her CGM downloads: It appears that 25% of values are in target range (goal >70%), while 75% are higher than 180 (goal <25%), and 0% are lower than 70 (goal <4%).  The calculated average blood sugar is 257.  The projected HbA1c for the next 3 months (GMI) is 9.5%. -Reviewing the CGM trends, it appears that her sugars are decreasing overnight and they are better, but still above target from approximately 3 AM to 12 PM.  However, after this, sugars increase abruptly and they stay high until 3 AM.  Upon questioning, her son and his brother is giving her the injections of NovoLog mostly after the meal, and  only occasionally right before meals.  She also occasionally misses some injections.  Moreover, they did not increase the dose of NovoLog as discussed at last visit.  They did decrease the dose of NPH, and she is only taking 1 dose in the morning.  At this visit, I advised the son to continue to increase NPH.  Also, we will increase the dose of NovoLog from 8 units to 12 units before breakfast and I advised him to continue to fluctuate the dose before lunch and dinner between 10 and 12 units.  I advised him also to continue to increase the dose if needed, based on the blood sugars.  -She just ran out of Trulicity and I advised her to restart and send a new prescription to her pharmacy. - I advised her to:   Patient Instructions  Please restart: - Trulicity 3 mg weekly  Increase: - NPH 28 units in am - Novolog: 12 units before b'fast 10-12 units before lunch and dinner  Adjust the doses up if still needed after few days.  Please return in 3 months.  - we checked her HbA1c: 9.5% (lower) - advised to check sugars at different times of the day - 4x a day, rotating check times - advised for yearly eye exams >> she is UTD - return to clinic in 3 months    2. HL -Reviewed latest lipid panel from 04/2020: LDL at goal, triglycerides elevated: Lab Results  Component Value Date   CHOL 162 04/08/2020   HDL 60 04/08/2020   LDLCALC 65 04/08/2020   LDLDIRECT 112.0 12/27/2018   TRIG 284 (H) 04/08/2020   CHOLHDL 2.7 04/08/2020  -She continues on simvastatin 40 mg daily without side effects -She is due for another lipid panel - will check this today  3.  Overweight -We will continue the GLP-1 receptor agonist which should also help with weight loss -She gained 13 pounds since last visit, but then lost them  Component     Latest Ref Rng & Units 06/11/2021  Sodium     135 - 145 mEq/L 137  Potassium     3.5 - 5.1 mEq/L 4.5  Chloride     96 - 112 mEq/L 100  CO2     19 - 32 mEq/L 28   Glucose     70 - 99 mg/dL 263 (H)  BUN     6 - 23 mg/dL 29 (H)  Creatinine     0.40 - 1.20 mg/dL 1.46 (H)  Total Bilirubin     0.2 - 1.2 mg/dL 0.5  Alkaline Phosphatase     39 - 117 U/L 50  AST     0 - 37 U/L 35  ALT     0 - 35 U/L 22  Total Protein     6.0 - 8.3 g/dL 7.1  Albumin     3.5 - 5.2 g/dL 3.9  Calcium     8.4 - 10.5 mg/dL 9.2  GFR     >60.00 mL/min 31.61 (L)  Cholesterol     0 - 200 mg/dL 186  Triglycerides     0.0 - 149.0 mg/dL 259.0 (H)  HDL Cholesterol     >39.00 mg/dL 54.20  VLDL     0.0 - 40.0 mg/dL 51.8 (H)  Total CHOL/HDL Ratio      3  NonHDL      132.10  Hemoglobin A1C     4.0 - 5.6 % 9.5 (A)  Direct LDL     mg/dL 106.0   GFR low, but actually improved from before. Glucose and triglycerides high.  LDL above goal.  I will check with her if she is taking the 40 mg of Zocor consistently.  If so, we may need to change to Crestor 20 mg daily.  Philemon Kingdom, MD PhD [facility] Endocrinology

## 2021-06-11 NOTE — Patient Instructions (Signed)
Please restart: - Trulicity 3 mg weekly  Increase: - NPH 28 units in am - Novolog: 12 units before b'fast 10-12 units before lunch and dinner  Adjust the doses up if still needed after few days.  Please return in 3 months.

## 2021-06-15 ENCOUNTER — Ambulatory Visit: Payer: Self-pay

## 2021-06-15 DIAGNOSIS — E1121 Type 2 diabetes mellitus with diabetic nephropathy: Secondary | ICD-10-CM

## 2021-06-15 DIAGNOSIS — R4189 Other symptoms and signs involving cognitive functions and awareness: Secondary | ICD-10-CM

## 2021-06-15 NOTE — Chronic Care Management (AMB) (Signed)
°  Care Management   Follow Up Note   06/15/2021 Name: [name] [name] MRN: [id number] DOB: 01-03-1929   Referred by: Dorothyann Peng, NP Reason for referral : Chronic Care Management (Case closure unable to maintain contact)   Pt has not responded to three outreach attempts.. The patient was referred to the case management team for assistance with care management and care coordination. The patient's primary care provider has been notified of our unsuccessful attempts to make or maintain contact with the patient. The care management team is pleased to engage with this patient at any time in the future should he/she be interested in assistance from the care management team.  Follow Up Plan: We have been unable to make contact with the patient for follow up. The care management team is available to follow up with the patient after provider conversation with the patient regarding recommendation for care management engagement and subsequent re-referral to the care management team.  Peter Garter RN, [facility], CDE Care Management Coordinator [place] [phone], Mobile [phone]

## 2021-06-15 NOTE — Patient Instructions (Signed)
Visit Information  Thank you for allowing me to share the care management and care coordination services that are available to you as part of your health plan and services through your primary care provider and medical home. Please reach out to me at [phone] if the care management/care coordination team may be of assistance to you in the future.  .mj

## 2021-06-16 ENCOUNTER — Telehealth: Payer: Self-pay

## 2021-06-16 NOTE — Telephone Encounter (Signed)
Inbound fax requesting forms be completed and faxed. Forms completed and faxed to [employer] at [phone]

## 2021-06-20 ENCOUNTER — Encounter (HOSPITAL_BASED_OUTPATIENT_CLINIC_OR_DEPARTMENT_OTHER): Payer: Self-pay

## 2021-06-20 ENCOUNTER — Other Ambulatory Visit: Payer: Self-pay

## 2021-06-20 ENCOUNTER — Emergency Department (HOSPITAL_BASED_OUTPATIENT_CLINIC_OR_DEPARTMENT_OTHER): Payer: Medicare HMO

## 2021-06-20 ENCOUNTER — Emergency Department (HOSPITAL_BASED_OUTPATIENT_CLINIC_OR_DEPARTMENT_OTHER)
Admission: EM | Admit: 2021-06-20 | Discharge: 2021-06-20 | Disposition: A | Discharge status: Home / Self Care | Payer: Medicare HMO | Attending: Emergency Medicine | Admitting: Emergency Medicine

## 2021-06-20 DIAGNOSIS — U071 COVID-19: Secondary | ICD-10-CM | POA: Diagnosis not present

## 2021-06-20 DIAGNOSIS — Z794 Long term (current) use of insulin: Secondary | ICD-10-CM | POA: Insufficient documentation

## 2021-06-20 DIAGNOSIS — J45909 Unspecified asthma, uncomplicated: Secondary | ICD-10-CM | POA: Insufficient documentation

## 2021-06-20 DIAGNOSIS — Z7982 Long term (current) use of aspirin: Secondary | ICD-10-CM | POA: Insufficient documentation

## 2021-06-20 DIAGNOSIS — Z87891 Personal history of nicotine dependence: Secondary | ICD-10-CM | POA: Insufficient documentation

## 2021-06-20 DIAGNOSIS — R059 Cough, unspecified: Secondary | ICD-10-CM | POA: Diagnosis not present

## 2021-06-20 DIAGNOSIS — I10 Essential (primary) hypertension: Secondary | ICD-10-CM | POA: Insufficient documentation

## 2021-06-20 DIAGNOSIS — E1129 Type 2 diabetes mellitus with other diabetic kidney complication: Secondary | ICD-10-CM | POA: Insufficient documentation

## 2021-06-20 DIAGNOSIS — R0602 Shortness of breath: Secondary | ICD-10-CM | POA: Diagnosis not present

## 2021-06-20 NOTE — ED Notes (Signed)
Attempt x3 to dc

## 2021-06-20 NOTE — Discharge Instructions (Addendum)
You do not have pneumonia today.  Follow-up with your primary care provider as needed.  Return to the emergency department if you have chest pain, shortness of breath or any worsening of your condition.

## 2021-06-20 NOTE — ED Provider Notes (Signed)
[facility] DE[facility] Provider Note   CSN: [id number] Arrival date & time: 06/20/21  1600     History Chief Complaint  Patient presents with   Covid Positive    [name] [name] is a 87 y.o. female with a past medical history of asthma, hyperlipidemia, hypertension presenting today with a complaint of cough.  She tested positive for COVID-19 yesterday.  She reports that she has had a cough that began Wednesday.  This cough started out dry however over the last 2 days it has been productive of phlegm that she is having difficulty clearing.  Denies any fevers or chills.  No chest pain or difficulty breathing.  Endorsing rhinorrhea, no nasal congestion.  Does not wear oxygen at home.  Her granddaughter is concerned and wanted to see if she qualified for antiviral treatment.  Past Medical History:  Diagnosis Date   Asthma    DEGENERATIVE JOINT DISEASE, GENERALIZED 02/01/2007   Qualifier: Diagnosis of  By: Burnice Logan  MD, Doretha Sou    DIARRHEA, CHRONIC 08/20/2009   Qualifier: Diagnosis of  By: Burnice Logan  MD, Doretha Sou    DM 02/01/2007   Qualifier: Diagnosis of  By: Floyde Parkins     FATIGUE 10/20/2008   Qualifier: Diagnosis of  By: Burnice Logan  MD, Doretha Sou    HYPERCHOLESTEROLEMIA 02/01/2007   Qualifier: Diagnosis of  By: Mariam Dollar 12/20/2007   Qualifier: Diagnosis of  By: Burnice Logan  MD, Doretha Sou    HYPERTENSION 02/01/2007   Qualifier: Diagnosis of  By: Floyde Parkins     LOW BACK PAIN 01/13/2010   Qualifier: Diagnosis of  By: Burnice Logan  MD, Doretha Sou    MUSCLE CRAMPS 04/06/2010   Qualifier: Diagnosis of  By: Burnice Logan  MD, Doretha Sou    MYALGIA 01/20/2010   Qualifier: Diagnosis of  By: Elease Hashimoto MD, Bruce     OSTEOARTHRITIS 01/13/2010   Qualifier: Diagnosis of  By: Burnice Logan  MD, Doretha Sou    OVERACTIVE BLADDER 08/20/2009   Qualifier: Diagnosis of  By: Burnice Logan  MD, Doretha Sou    PLEURISY 06/18/2008   Qualifier: Diagnosis of  By: Niel Hummer MD, Lorinda Creed     PNEUMONIA 06/18/2008   Qualifier: Diagnosis of  By: Niel Hummer MD, Willie R    Rectal prolapse 09/20/2007   Qualifier: Diagnosis of  By: Burnice Logan  MD, Doretha Sou    VITAMIN B12 DEFICIENCY 07/20/2010   Qualifier: Diagnosis of  By: Carlean Purl MD, Tonna Boehringer E     Patient Active Problem List   Diagnosis Date Noted   Overweight (BMI 25.0-29.9) 03/15/2018   Type 2 diabetes mellitus with diabetic nephropathy, with long-term current use of insulin ([place]) 06/17/2014   VITAMIN B12 DEFICIENCY 07/20/2010   LOW BACK PAIN 01/13/2010   OVERACTIVE BLADDER 08/20/2009   DIARRHEA, CHRONIC 08/20/2009   Fatigue 10/20/2008   RECTAL PROLAPSE 09/20/2007   HYPERCHOLESTEROLEMIA 02/01/2007   Essential hypertension 02/01/2007   Osteoarthritis 02/01/2007    Past Surgical History:  Procedure Laterality Date   ABDOMINAL HYSTERECTOMY     partial- states she has her ovaries   APPENDECTOMY     BLADDER SURGERY     CATARACT EXTRACTION Bilateral 2012   CHOLECYSTECTOMY       OB History   No obstetric history on file.     Family History  Problem Relation Age of Onset   Deep vein thrombosis Mother    COPD Father    Asthma Father  Diabetes Neg Hx        family hx   Cancer Neg Hx        breast ca - sister(S)?   Heart disease Neg Hx        family hx    Social History   Tobacco Use   Smoking status: Former    Packs/day: 10.00    Types: Cigarettes   Smokeless tobacco: Never   Tobacco comments:    quit > 67 yo   Vaping Use   Vaping Use: Never used  Substance Use Topics   Alcohol use: No   Drug use: No    Home Medications Prior to Admission medications   Medication Sig Start Date End Date Taking? Authorizing Provider  Acetaminophen 650 MG TABS Take 1 tablet (650 mg total) by mouth 3 (three) times daily as needed. 12/19/12   Moreno-Coll, Adlih, MD  aspirin 81 MG tablet Take 81 mg by mouth daily.    [provider]  Blood Glucose Calibration (ACCU-CHEK GUIDE CONTROL) LIQD Use as  directed 05/07/20   Dorothyann Peng, NP  Blood Glucose Monitoring Suppl (ACCU-CHEK AVIVA PLUS) w/Device KIT Use to check blood sugar 3 times a day 08/15/19   Philemon Kingdom, MD  Blood Glucose Monitoring Suppl (ACCU-CHEK GUIDE) w/Device KIT Use as directed 05/07/20   Dorothyann Peng, NP  calcium-vitamin D (OSCAL WITH D 500-200) 500-200 MG-UNIT per tablet Take 1 tablet by mouth daily.    [provider]  Continuous Blood Gluc Sensor (FREESTYLE LIBRE 14 DAY SENSOR) MISC Use to check blood glucose 3 times a day 09/30/20   Philemon Kingdom, MD  Dulaglutide (TRULICITY) 3 MM/3.8TR SOPN Inject 3 mg into the skin once a week. 06/11/21   Philemon Kingdom, MD  fluticasone (FLONASE) 50 MCG/ACT nasal spray Place 1 spray into the nose daily. 12/19/12   Moreno-Coll, Adlih, MD  Glucagon 3 MG/DOSE POWD Place 3 mg into the nose once as needed for up to 1 dose. 09/11/20   Philemon Kingdom, MD  glucose blood (ACCU-CHEK AVIVA PLUS) test strip Use to check blood sugar 3 times a day. 08/15/19   Philemon Kingdom, MD  glucose blood (ACCU-CHEK GUIDE) test strip CHECK BLOOD SUGAR THREE TIMES DAILY 09/02/20   Philemon Kingdom, MD  insulin NPH Human (NOVOLIN N) 100 UNIT/ML injection Inject 0.28 mLs (28 Units total) into the skin daily before breakfast. Pens please 06/11/21   Philemon Kingdom, MD  Insulin Pen Needle (BD PEN NEEDLE NANO U/F) 32G X 4 MM MISC USE TO INJECT INSULIN 4 TIMES DAILY AS DIRECTED. 08/15/19   Philemon Kingdom, MD  Lancets [facility] ULTRASOFT) lancets Use as instructed to test 3 times daily 08/15/19   Philemon Kingdom, MD  Multiple Vitamin (MULTIVITAMIN) tablet Take 1 tablet by mouth daily.    [provider]  NOVOLOG FLEXPEN 100 UNIT/ML FlexPen Inject under skin 10-12 units 3x a day before meals 06/11/21   Philemon Kingdom, MD  simvastatin (ZOCOR) 40 MG tablet TAKE 1 TABLET EVERY DAY 03/10/21   Nafziger, Tommi Rumps, NP  trazodone (DESYREL) 300 MG tablet TAKE 1 TABLET AT BEDTIME 04/06/21   Nafziger,  Tommi Rumps, NP  venlafaxine XR (EFFEXOR-XR) 75 MG 24 hr capsule TAKE 1 CAPSULE EVERY DAY 04/06/21   Nafziger, Tommi Rumps, NP    Allergies    Metformin and Sitagliptin phosphate  Review of Systems   Review of Systems  Constitutional:  Negative for chills and fever.  Respiratory:  Positive for cough. Negative for shortness of breath.   Cardiovascular:  Negative for chest pain and palpitations.  Gastrointestinal:  Negative for diarrhea and vomiting.   Physical Exam Updated Vital Signs BP 130/71 (BP Location: Right Arm)    Pulse 74    Temp 98.9 F (37.2 C) (Oral)    Resp 20    Ht _0  (1.626 m)    Wt 71.5 kg    SpO2 91%    BMI 27.06 kg/m   Physical Exam Vitals and nursing note reviewed.  Constitutional:      Appearance: Normal appearance.  HENT:     Head: Normocephalic and atraumatic.     Right Ear: Tympanic membrane normal.     Left Ear: Tympanic membrane normal.     Mouth/Throat:     Mouth: Mucous membranes are moist.     Pharynx: Oropharynx is clear. No posterior oropharyngeal erythema.  Eyes:     General: No scleral icterus.    Conjunctiva/sclera: Conjunctivae normal.  Cardiovascular:     Rate and Rhythm: Normal rate and regular rhythm.  Pulmonary:     Effort: Pulmonary effort is normal. No respiratory distress.     Breath sounds: No wheezing or rhonchi.  Abdominal:     General: Abdomen is flat.     Palpations: Abdomen is soft.     Tenderness: There is no abdominal tenderness.  Skin:    General: Skin is warm and dry.     Findings: No rash.  Neurological:     Mental Status: She is alert.  Psychiatric:        Mood and Affect: Mood normal.    ED Results / Procedures / Treatments   Labs (all labs ordered are listed, but only abnormal results are displayed) Labs Reviewed - No data to display  EKG None  Radiology DG Chest Portable 1 View  Result Date: 06/20/2021 CLINICAL DATA:  Cough for 1 week. Shortness of breath. Positive COVID test at home. EXAM: PORTABLE CHEST 1  VIEW COMPARISON:  02/07/2020. FINDINGS: Cardiac silhouette is normal in size. No mediastinal or hilar masses. Stable elevation of the right hemidiaphragm. Clear lungs.  No convincing pleural effusion.  No pneumothorax. Skeletal structures are grossly intact. IMPRESSION: No active disease. Electronically Signed   By: Lajean Manes M.D.   On: 06/20/2021 16:36    Procedures Procedures   Medications Ordered in ED Medications - No data to display  ED Course  I have reviewed the triage vital signs and the nursing notes.  Pertinent labs & imaging results that were available during my care of the patient were reviewed by me and considered in my medical decision making (see chart for details).    MDM Rules/Calculators/A&P 87 year old female presenting today with COVID-19.  Denies any serious symptoms however her granddaughter was concerned and wanted to see if she qualified for antivirals.  She is just barely outside the window for this treatment.  She is not suffering from a severe COVID-19 at this time.  Originally oxygen saturation recorded at 91 however throughout my evaluation she never dipped lower than 94.  Ambulated with RN without any desaturations.  Chest x-ray unremarkable.  Stable for discharge at this time. Final Clinical Impression(s) / ED Diagnoses Final diagnoses:  COVID-19    Rx / DC Orders Results and diagnoses were explained to the patient. Return precautions discussed in full. Patient had no additional questions and expressed complete understanding.     Darliss Ridgel 06/20/21 1735    Truddie Hidden, MD 06/20/21 [phone]

## 2021-06-20 NOTE — ED Notes (Signed)
SPO2 while ambulating: 95%. She tolerates ambulating quite well. Her son remains with her.

## 2021-06-20 NOTE — ED Notes (Signed)
ED Provider at bedside. 

## 2021-06-20 NOTE — ED Triage Notes (Signed)
Pt having increased cough x 1 week. Pt had a + COVID home test. Pt noted to have a persistent cough.

## 2021-06-22 ENCOUNTER — Telehealth (INDEPENDENT_AMBULATORY_CARE_PROVIDER_SITE_OTHER): Payer: Medicare HMO | Admitting: Adult Health

## 2021-06-22 DIAGNOSIS — U071 COVID-19: Secondary | ICD-10-CM | POA: Diagnosis not present

## 2021-06-22 MED ORDER — MOLNUPIRAVIR EUA 200MG CAPSULE: 4.0000 | ORAL_CAPSULE | Freq: Two times a day (BID) | ORAL | 0 refills | Status: AC

## 2021-06-22 NOTE — Progress Notes (Signed)
Virtual Visit via Telephone Note  I connected with [name] [name] on 06/22/21 at  4:30 PM EST by telephone and verified that I am speaking with the correct person using two identifiers.   I discussed the limitations, risks, security and privacy concerns of performing an evaluation and management service by telephone and the availability of in person appointments. I also discussed with the patient that there may be a patient responsible charge related to this service. The patient expressed understanding and agreed to proceed.  Location patient: home Location provider: work or home office Participants present for the call: patient, provider, son Patient did not have a visit in the prior 7 days to address this/these issue(s).   History of Present Illness: 87 year old female who is being evaluated today for active COVID-19 infection.  Speaking with her son today who is the main caregiver.  She was seen 2 days ago the emergency room after testing positive.  There was some confusion on when her symptoms started and she was not prescribed antivirals as a thought that she was outside the treatment window.  In actuality, her symptoms started 4 days ago.  Symptoms include cough, fatigue, body aches, and mild shortness of breath.  Denies fevers or chills.  Does not feel as though she has difficulty breathing.   Observations/Objective: Patient sounds cheerful and well on the phone. I do not appreciate any SOB. Speech and thought processing are grossly intact. Patient reported vitals:  Assessment and Plan: 1. COVID-19 virus infection -Due to age and health history will treat with Lagevrio she does have decreased kidney function.  Advise follow-up if symptoms are not improving. - molnupiravir EUA (LAGEVRIO) 200 mg CAPS capsule; Take 4 capsules (800 mg total) by mouth 2 (two) times daily for 5 days.  Dispense: 40 capsule; Refill: 0   Follow Up Instructions:  DIAGMED@   [id number] [id number]  [id number] [id number] 21-30 I did not refer this patient for an OV in the next 24 hours for this/these issue(s).  I discussed the assessment and treatment plan with the patient. The patient was provided an opportunity to ask questions and all were answered. The patient agreed with the plan and demonstrated an understanding of the instructions.   The patient was advised to call back or seek an in-person evaluation if the symptoms worsen or if the condition fails to improve as anticipated.  I provided 20 minutes of non-face-to-face time during this encounter.   Dorothyann Peng, NP

## 2021-08-09 ENCOUNTER — Ambulatory Visit (INDEPENDENT_AMBULATORY_CARE_PROVIDER_SITE_OTHER): Payer: Medicare HMO

## 2021-08-09 VITALS — BP 122/60 | HR 62 | Temp 98.0°F | Ht 63.0 in | Wt 159.7 lb

## 2021-08-09 DIAGNOSIS — Z0000 Encounter for general adult medical examination without abnormal findings: Secondary | ICD-10-CM | POA: Diagnosis not present

## 2021-08-09 NOTE — Patient Instructions (Addendum)
[name] [name] , Thank you for taking time to come for your Medicare Wellness Visit. I appreciate your ongoing commitment to your health goals. Please review the following plan we discussed and let me know if I can assist you in the future.   These are the goals we discussed:  Goals      Exercise 3x per week (30 min per time)     patient     Maintain my health; keep engaged socially.         This is a list of the screening recommended for you and due dates:  Health Maintenance  Topic Date Due   Urine Protein Check  06/01/2018   Complete foot exam   12/27/2019   COVID-19 Vaccine (4 - Booster for Pfizer series) 07/13/2020   Tetanus Vaccine  04/06/2021   Zoster (Shingles) Vaccine (1 of 2) 11/06/2021*   Hemoglobin A1C  12/10/2021   Eye exam for diabetics  05/17/2022   Pneumonia Vaccine  Completed   Flu Shot  Completed   DEXA scan (bone density measurement)  Completed   HPV Vaccine  Aged Out  *Topic was postponed. The date shown is not the original due date.   Advanced directives: Yes Documents on file  Conditions/risks identified: None  Next appointment: Follow up in one year for your annual wellness visit    Preventive Care 65 Years and Older, Female Preventive care refers to lifestyle choices and visits with your health care provider that can promote health and wellness. What does preventive care include? A yearly physical exam. This is also called an annual well check. Dental exams once or twice a year. Routine eye exams. Ask your health care provider how often you should have your eyes checked. Personal lifestyle choices, including: Daily care of your teeth and gums. Regular physical activity. Eating a healthy diet. Avoiding tobacco and drug use. Limiting alcohol use. Practicing safe sex. Taking low-dose aspirin every day. Taking vitamin and mineral supplements as recommended by your health care provider. What happens during an annual well check? The services and  screenings done by your health care provider during your annual well check will depend on your age, overall health, lifestyle risk factors, and family history of disease. Counseling  Your health care provider may ask you questions about your: Alcohol use. Tobacco use. Drug use. Emotional well-being. Home and relationship well-being. Sexual activity. Eating habits. History of falls. Memory and ability to understand (cognition). Work and work Statistician. Reproductive health. Screening  You may have the following tests or measurements: Height, weight, and BMI. Blood pressure. Lipid and cholesterol levels. These may be checked every 5 years, or more frequently if you are over 24 years old. Skin check. Lung cancer screening. You may have this screening every year starting at age 64 if you have a 30-pack-year history of smoking and currently smoke or have quit within the past 15 years. Fecal occult blood test (FOBT) of the stool. You may have this test every year starting at age 26. Flexible sigmoidoscopy or colonoscopy. You may have a sigmoidoscopy every 5 years or a colonoscopy every 10 years starting at age 33. Hepatitis C blood test. Hepatitis B blood test. Sexually transmitted disease (STD) testing. Diabetes screening. This is done by checking your blood sugar (glucose) after you have not eaten for a while (fasting). You may have this done every 1-3 years. Bone density scan. This is done to screen for osteoporosis. You may have this done starting at age 36. Mammogram.  This may be done every 1-2 years. Talk to your health care provider about how often you should have regular mammograms. Talk with your health care provider about your test results, treatment options, and if necessary, the need for more tests. Vaccines  Your health care provider may recommend certain vaccines, such as: Influenza vaccine. This is recommended every year. Tetanus, diphtheria, and acellular pertussis (Tdap,  Td) vaccine. You may need a Td booster every 10 years. Zoster vaccine. You may need this after age 64. Pneumococcal 13-valent conjugate (PCV13) vaccine. One dose is recommended after age 60. Pneumococcal polysaccharide (PPSV23) vaccine. One dose is recommended after age 73. Talk to your health care provider about which screenings and vaccines you need and how often you need them. This information is not intended to replace advice given to you by your health care provider. Make sure you discuss any questions you have with your health care provider. Document Released: 07/17/2015 Document Revised: 03/09/2016 Document Reviewed: 04/21/2015 Elsevier Interactive Patient Education  2017 [employer] Prevention in the Home Falls can cause injuries. They can happen to people of all ages. There are many things you can do to make your home safe and to help prevent falls. What can I do on the outside of my home? Regularly fix the edges of walkways and driveways and fix any cracks. Remove anything that might make you trip as you walk through a door, such as a raised step or threshold. Trim any bushes or trees on the path to your home. Use bright outdoor lighting. Clear any walking paths of anything that might make someone trip, such as rocks or tools. Regularly check to see if handrails are loose or broken. Make sure that both sides of any steps have handrails. Any raised decks and porches should have guardrails on the edges. Have any leaves, snow, or ice cleared regularly. Use sand or salt on walking paths during winter. Clean up any spills in your garage right away. This includes oil or grease spills. What can I do in the bathroom? Use night lights. Install grab bars by the toilet and in the tub and shower. Do not use towel bars as grab bars. Use non-skid mats or decals in the tub or shower. If you need to sit down in the shower, use a plastic, non-slip stool. Keep the floor dry. Clean up any  water that spills on the floor as soon as it happens. Remove soap buildup in the tub or shower regularly. Attach bath mats securely with double-sided non-slip rug tape. Do not have throw rugs and other things on the floor that can make you trip. What can I do in the bedroom? Use night lights. Make sure that you have a light by your bed that is easy to reach. Do not use any sheets or blankets that are too big for your bed. They should not hang down onto the floor. Have a firm chair that has side arms. You can use this for support while you get dressed. Do not have throw rugs and other things on the floor that can make you trip. What can I do in the kitchen? Clean up any spills right away. Avoid walking on wet floors. Keep items that you use a lot in easy-to-reach places. If you need to reach something above you, use a strong step stool that has a grab bar. Keep electrical cords out of the way. Do not use floor polish or wax that makes floors slippery. If you  must use wax, use non-skid floor wax. Do not have throw rugs and other things on the floor that can make you trip. What can I do with my stairs? Do not leave any items on the stairs. Make sure that there are handrails on both sides of the stairs and use them. Fix handrails that are broken or loose. Make sure that handrails are as long as the stairways. Check any carpeting to make sure that it is firmly attached to the stairs. Fix any carpet that is loose or worn. Avoid having throw rugs at the top or bottom of the stairs. If you do have throw rugs, attach them to the floor with carpet tape. Make sure that you have a light switch at the top of the stairs and the bottom of the stairs. If you do not have them, ask someone to add them for you. What else can I do to help prevent falls? Wear shoes that: Do not have high heels. Have rubber bottoms. Are comfortable and fit you well. Are closed at the toe. Do not wear sandals. If you use a  stepladder: Make sure that it is fully opened. Do not climb a closed stepladder. Make sure that both sides of the stepladder are locked into place. Ask someone to hold it for you, if possible. Clearly mark and make sure that you can see: Any grab bars or handrails. First and last steps. Where the edge of each step is. Use tools that help you move around (mobility aids) if they are needed. These include: Canes. Walkers. Scooters. Crutches. Turn on the lights when you go into a dark area. Replace any light bulbs as soon as they burn out. Set up your furniture so you have a clear path. Avoid moving your furniture around. If any of your floors are uneven, fix them. If there are any pets around you, be aware of where they are. Review your medicines with your doctor. Some medicines can make you feel dizzy. This can increase your chance of falling. Ask your doctor what other things that you can do to help prevent falls. This information is not intended to replace advice given to you by your health care provider. Make sure you discuss any questions you have with your health care provider. Document Released: 04/16/2009 Document Revised: 11/26/2015 Document Reviewed: 07/25/2014 Elsevier Interactive Patient Education  2017 [employer].

## 2021-08-09 NOTE — Progress Notes (Signed)
Subjective:   [name] [name] is a 88 y.o. female who presents for Medicare Annual (Subsequent) preventive examination.  Review of Systems     Cardiac Risk Factors include: advanced age (>82mn, >>36women);diabetes mellitus;hypertension     Objective:    Today's Vitals   08/09/21 1527  BP: 122/60  Pulse: 62  Temp: 98 F (36.7 C)  TempSrc: Oral  SpO2: 95%  Weight: 159 lb 11.2 oz (72.4 kg)  Height: _0  (1.6 m)   Body mass index is 28.29 kg/m.  Advanced Directives 08/09/2021 06/20/2021 05/24/2021 01/05/2021 09/09/2020 08/07/2020 05/05/2016  Does Patient Have a Medical Advance Directive? _1  No No  Type of [place]amed[place] will [place] - [place];Out of facility DNR (pink MOST or yellow form) [place]- -  Does patient want to make changes to medical advance directive? No - Patient declined - - No - Patient declined - - -  Copy of [place] Chart? Yes - validated most recent copy scanned in chart (See row information) - - No - copy requested - - -  Would patient like information on creating a medical advance directive? - - - - - No - Patient declined Yes - Educational materials given    Current Medications (verified) Outpatient Encounter Medications as of 08/09/2021  Medication Sig   Acetaminophen 650 MG TABS Take 1 tablet (650 mg total) by mouth 3 (three) times daily as needed.   aspirin 81 MG tablet Take 81 mg by mouth daily.   Blood Glucose Calibration (ACCU-CHEK GUIDE CONTROL) LIQD Use as directed   Blood Glucose Monitoring Suppl (ACCU-CHEK AVIVA PLUS) w/Device KIT Use to check blood sugar 3 times a day   Blood Glucose Monitoring Suppl (ACCU-CHEK GUIDE) w/Device KIT Use as directed   calcium-vitamin D (OSCAL WITH D 500-200) 500-200 MG-UNIT per tablet Take 1 tablet by mouth daily.   Continuous Blood Gluc Sensor (FREESTYLE  LIBRE 14 DAY SENSOR) MISC Use to check blood glucose 3 times a day   Dulaglutide (TRULICITY) 3 MWN/4.6EVSOPN Inject 3 mg into the skin once a week.   fluticasone (FLONASE) 50 MCG/ACT nasal spray Place 1 spray into the nose daily.   Glucagon 3 MG/DOSE POWD Place 3 mg into the nose once as needed for up to 1 dose.   glucose blood (ACCU-CHEK AVIVA PLUS) test strip Use to check blood sugar 3 times a day.   glucose blood (ACCU-CHEK GUIDE) test strip CHECK BLOOD SUGAR THREE TIMES DAILY   insulin NPH Human (NOVOLIN N) 100 UNIT/ML injection Inject 0.28 mLs (28 Units total) into the skin daily before breakfast. Pens please   Insulin Pen Needle (BD PEN NEEDLE NANO U/F) 32G X 4 MM MISC USE TO INJECT INSULIN 4 TIMES DAILY AS DIRECTED.   Lancets (ONETOUCH ULTRASOFT) lancets Use as instructed to test 3 times daily   Multiple Vitamin (MULTIVITAMIN) tablet Take 1 tablet by mouth daily.   NOVOLOG FLEXPEN 100 UNIT/ML FlexPen Inject under skin 10-12 units 3x a day before meals   simvastatin (ZOCOR) 40 MG tablet TAKE 1 TABLET EVERY DAY   trazodone (DESYREL) 300 MG tablet TAKE 1 TABLET AT BEDTIME   venlafaxine XR (EFFEXOR-XR) 75 MG 24 hr capsule TAKE 1 CAPSULE EVERY DAY   No facility-administered encounter medications on file as of 08/09/2021.    Allergies (verified) Metformin and Sitagliptin phosphate   History: Past  Medical History:  Diagnosis Date   Asthma    DEGENERATIVE JOINT DISEASE, GENERALIZED 02/01/2007   Qualifier: Diagnosis of  By: Burnice Logan  MD, Doretha Sou    DIARRHEA, CHRONIC 08/20/2009   Qualifier: Diagnosis of  By: Burnice Logan  MD, Doretha Sou    DM 02/01/2007   Qualifier: Diagnosis of  By: Floyde Parkins     FATIGUE 10/20/2008   Qualifier: Diagnosis of  By: Burnice Logan  MD, Doretha Sou    HYPERCHOLESTEROLEMIA 02/01/2007   Qualifier: Diagnosis of  By: Mariam Dollar 12/20/2007   Qualifier: Diagnosis of  By: Burnice Logan  MD, Doretha Sou    HYPERTENSION 02/01/2007   Qualifier: Diagnosis of  By:  Russella Dar BACK PAIN 01/13/2010   Qualifier: Diagnosis of  By: Burnice Logan  MD, Doretha Sou    MUSCLE CRAMPS 04/06/2010   Qualifier: Diagnosis of  By: Burnice Logan  MD, Doretha Sou    MYALGIA 01/20/2010   Qualifier: Diagnosis of  By: Elease Hashimoto MD, Bruce     OSTEOARTHRITIS 01/13/2010   Qualifier: Diagnosis of  By: Burnice Logan  MD, Doretha Sou    OVERACTIVE BLADDER 08/20/2009   Qualifier: Diagnosis of  By: Burnice Logan  MD, Doretha Sou    PLEURISY 06/18/2008   Qualifier: Diagnosis of  By: Niel Hummer MD, Lorinda Creed    PNEUMONIA 06/18/2008   Qualifier: Diagnosis of  By: Niel Hummer MD, Lorinda Creed    Rectal prolapse 09/20/2007   Qualifier: Diagnosis of  By: Burnice Logan  MD, Doretha Sou    VITAMIN B12 DEFICIENCY 07/20/2010   Qualifier: Diagnosis of  By: Carlean Purl MD, Dimas Millin    Past Surgical History:  Procedure Laterality Date   ABDOMINAL HYSTERECTOMY     partial- states she has her ovaries   APPENDECTOMY     BLADDER SURGERY     CATARACT EXTRACTION Bilateral 2012   CHOLECYSTECTOMY     Family History  Problem Relation Age of Onset   Deep vein thrombosis Mother    COPD Father    Asthma Father    Diabetes Neg Hx        family hx   Cancer Neg Hx        breast ca - sister(S)?   Heart disease Neg Hx        family hx   Social History   Socioeconomic History   Marital status: Single    Spouse name: Not on file   Number of children: Not on file   Years of education: Not on file   Highest education level: Not on file  Occupational History   Not on file  Tobacco Use   Smoking status: Former    Packs/day: 10.00    Types: Cigarettes   Smokeless tobacco: Never   Tobacco comments:    quit > 78 yo   Vaping Use   Vaping Use: Never used  Substance and Sexual Activity   Alcohol use: No   Drug use: No   Sexual activity: Not on file  Other Topics Concern   Not on file  Social History Narrative   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    Married for 79 years, widowed for 22 years    Right handed    Lives alone    Social Determinants of Health   Financial Resource Strain: Low Risk    Difficulty of Paying Living Expenses: Not hard at all  Food Insecurity: No Food Insecurity   Worried About Running Out  of Food in the Last Year: Never true   [place] in the Last Year: Never true  Transportation Needs: No Transportation Needs   Lack of Transportation (Medical): No   Lack of Transportation (Non-Medical): No  Physical Activity: Inactive   Days of Exercise per Week: 0 days   Minutes of Exercise per Session: 0 min  Stress: No Stress Concern Present   Feeling of Stress : Not at all  Social Connections: Moderately Integrated   Frequency of Communication with Friends and Family: More than three times a week   Frequency of Social Gatherings with Friends and Family: More than three times a week   Attends Religious Services: 1 to 4 times per year   Active Member of [employer] or Organizations: Yes   Attends Archivist Meetings: 1 to 4 times per year   Marital Status: Widowed    Tobacco Counseling Counseling given: Not Answered Tobacco comments: quit > 59 yo    Clinical Intake:  Pre-visit preparation completed: Yesiabetic? Yes  Interpreter Needed?: NoNutrition Risk Assessment:  Has the patient had any N/V/D within the last 2 months?  No  Does the patient have any non-healing wounds?  No  Has the patient had any unintentional weight loss or weight gain?  No   Diabetes:  Is the patient diabetic?  Yes  If diabetic, was a CBG obtained today?  Yes  CBG 248 Did the patient bring in their glucometer from home?  No  How often do you monitor your CBG's? Daily.   Financial Strains and Diabetes Management:  Are you having any financial strains with the device, your supplies or your medication? No .  Does the patient want to be seen by Chronic Care Management for management of their diabetes?  No  Would the patient like to be referred to a Nutritionist or for Diabetic Management?  No  Followed by Dr Renne Crigler  Diabetic Exams:  Diabetic Eye Exam: Completed Yes. Overdue for diabetic eye exam. Pt has been advised about the importance in completing this exam. A referral has been placed today. Message sent to referral coordinator for scheduling purposes. Advised pt to expect a call from office referred to regarding appt.  Diabetic Foot Exam: Completed Yes. Pt has been advised about the importance in completing this exam.  Information entered by :: Rolene Arbour LPN Activities of Daily Living In your present state of health, do you have any difficulty performing the following activities: 08/09/2021  Hearing? N  Vision? N  Difficulty concentrating or making decisions? Y  Comment Family Assist  Walking or climbing stairs? N  Dressing or bathing? N  Comment Family Assist  Doing errands, shopping? N  Comment [place] and eating ? Y  Comment Family Assist  Using the Toilet? N  In the past six months, have you accidently leaked urine? N  Do you have problems with loss of bowel control? Y  Comment Wears breifs  Managing your Medications? Y  Comment Family Assist  Managing your Finances? Y  Comment Family Assist  Housekeeping or managing your Housekeeping? N  Comment Family Assist  Some recent data might be hidden    Patient Care Team: Dorothyann Peng, NP as PCP - General (Family Medicine) Earnie Larsson, [facility] as Pharmacist (Pharmacist) Cameron Sprang, MD as Consulting Physician (Neurology)  Indicate any recent Medical Services you may have received from other than [facility] providers in the past year (date may be approximate).  Assessment:   This is a routine wellness examination for [place].  Hearing/Vision screen Hearing Screening - Comments:: No difficulty hearing Vision Screening - Comments:: Wears glasses. Followed by Dr Celene Squibb  Dietary issues and exercise activities discussed: Current Exercise Habits: The patient does not participate  in regular exercise at present, Exercise limited by: None identified   Goals Addressed             This Visit's Progress    patient       Maintain my health; keep engaged socially.        Depression Screen PHQ 2/9 Scores 08/09/2021 01/05/2021 08/07/2020 04/08/2020 12/27/2018 05/08/2018 05/05/2016  PHQ - 2 Score 0 0 0 0 0 0 0    Fall Risk Fall Risk  05/24/2021 01/05/2021 09/09/2020 08/07/2020 04/08/2020  Falls in the past year? 0 - 1 1 -  Number falls in past yr: 0 1 1 0 0  Injury with Fall? 0 _0 0  Comment - hit back of head and on front of her face - - -  Risk for fall due to : - History of fall(s);Impaired balance/gait;Impaired mobility;Mental status change - History of fall(s);Impaired balance/gait -  Follow up - Education provided;Falls prevention discussed - Falls evaluation completed;Falls prevention discussed -    FALL RISK PREVENTION PERTAINING TO THE HOME:  Any stairs in or around the home? No  If so, are there any without handrails? No  Home free of loose throw rugs in walkways, pet beds, electrical cords, etc? Yes  Adequate lighting in your home to reduce risk of falls? Yes   ASSISTIVE DEVICES UTILIZED TO PREVENT FALLS:  Life alert? No  Use of a cane, walker or w/c? Yes  Grab bars in the bathroom? Yes  Shower chair or bench in shower? Yes  Elevated toilet seat or a handicapped toilet? Yes   TIMED UP AND GO:  Was the test performed? Yes .  Length of time to ambulate 10 feet: 5 sec.   Gait slow and steady without use of assistive device  Cognitive Function: MMSE - Mini Mental State Exam 08/21/2020 05/05/2016  Not completed: - (No Data)  Orientation to time 3 -  Orientation to Place 4 -  Registration 3 -  Attention/ Calculation 2 -  Recall 0 -  Language- name 2 objects 2 -  Language- repeat 1 -  Language- follow 3 step command 3 -  Language- read & follow direction 1 -  Write a sentence 1 -  Copy design 0 -  Total score 20 -   Montreal Cognitive Assessment   09/09/2020  Visuospatial/ Executive (0/5) 2  Naming (0/3) 2  Attention: Read list of digits (0/2) 2  Attention: Read list of letters (0/1) 0  Attention: Serial 7 subtraction starting at 100 (0/3) 1  Language: Repeat phrase (0/2) 1  Language : Fluency (0/1) 0  Abstraction (0/2) 2  Delayed Recall (0/5) 0  Orientation (0/6) 5  Total 15  Adjusted Score (based on education) 16   6CIT Screen 08/09/2021 08/07/2020  What Year? 4 points 0 points  What month? 0 points 0 points  What time? 0 points 0 points  Count back from 20 2 points 0 points  Months in reverse 4 points 0 points  Repeat phrase 2 points 6 points  Total Score 12 6    Immunizations Immunization History  Administered Date(s) Administered   Fluad Quad(high Dose 65+) 04/08/2020, 04/30/2021   Influenza Whole 04/03/2006, 05/09/2007, 03/20/2008, 04/20/2009, 03/12/2010  Influenza, High Dose Seasonal PF 03/04/2016, 03/31/2017, 03/15/2018   Influenza, Quadrivalent, Recombinant, Inj, Pf 04/02/2019   Influenza,inj,Quad PF,6+ Mos 03/20/2014   Influenza-Unspecified 03/11/2015   PFIZER(Purple Top)SARS-COV-2 Vaccination 08/31/2019, 09/28/2019, 05/18/2020   Pneumococcal Conjugate-13 03/24/2014, 12/05/2014   Pneumococcal Polysaccharide-23 07/04/2001   Td 07/04/2001   Tdap 04/07/2011   Zoster, Live 10/28/2010    TDAP status: Up to date  Flu Vaccine status: Up to date  Pneumococcal vaccine status: Up to date  Covid-19 vaccine status: Completed vaccines  Qualifies for Shingles Vaccine? Yes   Zostavax completed No   Shingrix Completed?: No.    Education has been provided regarding the importance of this vaccine. Patient has been advised to call insurance company to determine out of pocket expense if they have not yet received this vaccine. Advised may also receive vaccine at local pharmacy or Health Dept. Verbalized acceptance and understanding.  Screening Tests Health Maintenance  Topic Date Due   Zoster Vaccines- Shingrix (1 of  2) Never done   URINE MICROALBUMIN  06/01/2018   FOOT EXAM  12/27/2019   COVID-19 Vaccine (4 - Booster for Pfizer series) 07/13/2020   TETANUS/TDAP  04/06/2021   HEMOGLOBIN A1C  12/10/2021   OPHTHALMOLOGY EXAM  05/17/2022   Pneumonia Vaccine 3+ Years old  Completed   INFLUENZA VACCINE  Completed   DEXA SCAN  Completed   HPV VACCINES  Aged Out    Health Maintenance  Health Maintenance Due  Topic Date Due   Zoster Vaccines- Shingrix (1 of 2) Never done   URINE MICROALBUMIN  06/01/2018   FOOT EXAM  12/27/2019   COVID-19 Vaccine (4 - Booster for Pfizer series) 07/13/2020   TETANUS/TDAP  04/06/2021    Colorectal cancer screening: No longer required.   Mammogram status: No longer required due to Age.  Bone Density status: Completed Yes. Results reflect: Bone density results: NORMAL. Repeat every 10 years.  Additional Screening:  Vision Screening: Recommended annual ophthalmology exams for early detection of glaucoma and other disorders of the eye. Is the patient up to date with their annual eye exam?  Yes  Who is the provider or what is the name of the office in which the patient attends annual eye exams? [facility] Dr Celene Squibb If pt is not established with a provider, would they like to be referred to a provider to establish care? No .   Dental Screening: Recommended annual dental exams for proper oral hygiene  Community Resource Referral / Chronic Care Management: CRR required this visit?  No   CCM required this visit?  No      Plan:     I have personally reviewed and noted the following in the patients chart:   Medical and social history Use of alcohol, tobacco or illicit drugs Patient currently not taking opioids Current medications and supplements including opioid prescriptions.  Functional ability and status Nutritional status Physical activity Advanced directives List of other physicians Hospitalizations, surgeries, and ER visits in previous 12  months Vitals Screenings to include cognitive, depression, and falls Referrals and appointments  In addition, I have reviewed and discussed with patient certain preventive protocols, quality metrics, and best practice recommendations. A written personalized care plan for preventive services as well as general preventive health recommendations were provided to patient.     Criselda Peaches, LPN   08/12/6379   Nurse Notes: None

## 2021-08-19 DIAGNOSIS — E1121 Type 2 diabetes mellitus with diabetic nephropathy: Secondary | ICD-10-CM | POA: Diagnosis not present

## 2021-09-07 ENCOUNTER — Telehealth: Payer: Self-pay

## 2021-09-07 NOTE — Telephone Encounter (Signed)
PCP rep from [facility] called to update pt insulin instructions. Was advised per pt's last appt  ?Increase: ?- NPH 28 units in am ?- Novolog: ?12 units before b'fast ?10-12 units before lunch and dinner ?  ?Adjust the doses up if still needed after few days. ?

## 2021-09-09 ENCOUNTER — Emergency Department (HOSPITAL_COMMUNITY): Payer: Medicare (Managed Care)

## 2021-09-09 ENCOUNTER — Observation Stay (HOSPITAL_COMMUNITY): Payer: Medicare (Managed Care)

## 2021-09-09 ENCOUNTER — Inpatient Hospital Stay (HOSPITAL_COMMUNITY)
Admission: EM | Admit: 2021-09-09 | Discharge: 2021-09-11 | DRG: 064 | Disposition: A | Discharge status: Home Health | Payer: Medicare (Managed Care) | Attending: Internal Medicine | Admitting: Internal Medicine

## 2021-09-09 ENCOUNTER — Other Ambulatory Visit: Payer: Self-pay

## 2021-09-09 ENCOUNTER — Encounter (HOSPITAL_COMMUNITY): Payer: Self-pay | Admitting: Internal Medicine

## 2021-09-09 DIAGNOSIS — Z20822 Contact with and (suspected) exposure to covid-19: Secondary | ICD-10-CM | POA: Diagnosis present

## 2021-09-09 DIAGNOSIS — I10 Essential (primary) hypertension: Secondary | ICD-10-CM | POA: Diagnosis present

## 2021-09-09 DIAGNOSIS — J45909 Unspecified asthma, uncomplicated: Secondary | ICD-10-CM | POA: Diagnosis present

## 2021-09-09 DIAGNOSIS — R4701 Aphasia: Secondary | ICD-10-CM | POA: Diagnosis present

## 2021-09-09 DIAGNOSIS — E1165 Type 2 diabetes mellitus with hyperglycemia: Secondary | ICD-10-CM | POA: Diagnosis present

## 2021-09-09 DIAGNOSIS — R4182 Altered mental status, unspecified: Secondary | ICD-10-CM | POA: Diagnosis present

## 2021-09-09 DIAGNOSIS — Z66 Do not resuscitate: Secondary | ICD-10-CM | POA: Diagnosis present

## 2021-09-09 DIAGNOSIS — N3281 Overactive bladder: Secondary | ICD-10-CM | POA: Diagnosis present

## 2021-09-09 DIAGNOSIS — I6349 Cerebral infarction due to embolism of other cerebral artery: Principal | ICD-10-CM | POA: Diagnosis present

## 2021-09-09 DIAGNOSIS — G9341 Metabolic encephalopathy: Secondary | ICD-10-CM | POA: Diagnosis present

## 2021-09-09 DIAGNOSIS — R29721 NIHSS score 21: Secondary | ICD-10-CM | POA: Diagnosis present

## 2021-09-09 DIAGNOSIS — R32 Unspecified urinary incontinence: Secondary | ICD-10-CM | POA: Diagnosis present

## 2021-09-09 DIAGNOSIS — E7800 Pure hypercholesterolemia, unspecified: Secondary | ICD-10-CM | POA: Diagnosis present

## 2021-09-09 DIAGNOSIS — Z87891 Personal history of nicotine dependence: Secondary | ICD-10-CM

## 2021-09-09 DIAGNOSIS — Z7982 Long term (current) use of aspirin: Secondary | ICD-10-CM

## 2021-09-09 DIAGNOSIS — Z9183 Wandering in diseases classified elsewhere: Secondary | ICD-10-CM

## 2021-09-09 DIAGNOSIS — Z794 Long term (current) use of insulin: Secondary | ICD-10-CM

## 2021-09-09 DIAGNOSIS — E538 Deficiency of other specified B group vitamins: Secondary | ICD-10-CM | POA: Diagnosis present

## 2021-09-09 DIAGNOSIS — Z79899 Other long term (current) drug therapy: Secondary | ICD-10-CM

## 2021-09-09 DIAGNOSIS — I6330 Cerebral infarction due to thrombosis of unspecified cerebral artery: Secondary | ICD-10-CM | POA: Insufficient documentation

## 2021-09-09 DIAGNOSIS — R296 Repeated falls: Secondary | ICD-10-CM | POA: Diagnosis present

## 2021-09-09 DIAGNOSIS — F0150 Vascular dementia without behavioral disturbance: Secondary | ICD-10-CM | POA: Diagnosis present

## 2021-09-09 DIAGNOSIS — Z825 Family history of asthma and other chronic lower respiratory diseases: Secondary | ICD-10-CM

## 2021-09-09 DIAGNOSIS — Z8249 Family history of ischemic heart disease and other diseases of the circulatory system: Secondary | ICD-10-CM

## 2021-09-09 DIAGNOSIS — F0390 Unspecified dementia without behavioral disturbance: Secondary | ICD-10-CM

## 2021-09-09 LAB — DIFFERENTIAL
Abs Immature Granulocytes: 0.02 10*3/uL (ref 0.00–0.07)
Basophils Absolute: 0.1 10*3/uL (ref 0.0–0.1)
Basophils Relative: 1 %
Eosinophils Absolute: 0.1 10*3/uL (ref 0.0–0.5)
Eosinophils Relative: 1 %
Immature Granulocytes: 0 %
Lymphocytes Relative: 34 %
Lymphs Abs: 2.9 10*3/uL (ref 0.7–4.0)
Monocytes Absolute: 0.6 10*3/uL (ref 0.1–1.0)
Monocytes Relative: 7 %
Neutro Abs: 4.8 10*3/uL (ref 1.7–7.7)
Neutrophils Relative %: 57 %

## 2021-09-09 LAB — COMPREHENSIVE METABOLIC PANEL
ALT: 28 U/L (ref 0–44)
AST: 60 U/L — ABNORMAL HIGH (ref 15–41)
Albumin: 3.5 g/dL (ref 3.5–5.0)
Alkaline Phosphatase: 39 U/L (ref 38–126)
Anion gap: 11 (ref 5–15)
BUN: 23 mg/dL (ref 8–23)
CO2: 25 mmol/L (ref 22–32)
Calcium: 9.4 mg/dL (ref 8.9–10.3)
Chloride: 101 mmol/L (ref 98–111)
Creatinine, Ser: 1.48 mg/dL — ABNORMAL HIGH (ref 0.44–1.00)
GFR, Estimated: 33 mL/min — ABNORMAL LOW (ref >60)
Glucose, Bld: 118 mg/dL — ABNORMAL HIGH (ref 70–99)
Potassium: 4.6 mmol/L (ref 3.5–5.1)
Sodium: 137 mmol/L (ref 135–145)
Total Bilirubin: 1.2 mg/dL (ref 0.3–1.2)
Total Protein: 6.9 g/dL (ref 6.5–8.1)

## 2021-09-09 LAB — URINALYSIS, ROUTINE W REFLEX MICROSCOPIC
Bilirubin Urine: NEGATIVE
Glucose, UA: 50 mg/dL — AB
Hgb urine dipstick: NEGATIVE
Ketones, ur: NEGATIVE mg/dL
Leukocytes,Ua: NEGATIVE
Nitrite: NEGATIVE
Protein, ur: 100 mg/dL — AB
Specific Gravity, Urine: 1.018 (ref 1.005–1.030)
pH: 5 (ref 5.0–8.0)

## 2021-09-09 LAB — RAPID URINE DRUG SCREEN, HOSP PERFORMED
Amphetamines: NOT DETECTED
Barbiturates: NOT DETECTED
Benzodiazepines: NOT DETECTED
Cocaine: NOT DETECTED
Opiates: NOT DETECTED
Tetrahydrocannabinol: NOT DETECTED

## 2021-09-09 LAB — APTT: aPTT: 23 seconds — ABNORMAL LOW (ref 24–36)

## 2021-09-09 LAB — I-STAT CHEM 8, ED
BUN: 32 mg/dL — ABNORMAL HIGH (ref 8–23)
Calcium, Ion: 1.12 mmol/L — ABNORMAL LOW (ref 1.15–1.40)
Chloride: 103 mmol/L (ref 98–111)
Creatinine, Ser: 1.5 mg/dL — ABNORMAL HIGH (ref 0.44–1.00)
Glucose, Bld: 118 mg/dL — ABNORMAL HIGH (ref 70–99)
HCT: 38 % (ref 36.0–46.0)
Hemoglobin: 12.9 g/dL (ref 12.0–15.0)
Potassium: 4.6 mmol/L (ref 3.5–5.1)
Sodium: 137 mmol/L (ref 135–145)
TCO2: 32 mmol/L (ref 22–32)

## 2021-09-09 LAB — ETHANOL: Alcohol, Ethyl (B): <10 mg/dL (ref <10)

## 2021-09-09 LAB — CBC
HCT: 40 % (ref 36.0–46.0)
Hemoglobin: 13.3 g/dL (ref 12.0–15.0)
MCH: 31.3 pg (ref 26.0–34.0)
MCHC: 33.3 g/dL (ref 30.0–36.0)
MCV: 94.1 fL (ref 80.0–100.0)
Platelets: 237 10*3/uL (ref 150–400)
RBC: 4.25 MIL/uL (ref 3.87–5.11)
RDW: 12.4 % (ref 11.5–15.5)
WBC: 8.5 10*3/uL (ref 4.0–10.5)
nRBC: 0 % (ref 0.0–0.2)

## 2021-09-09 LAB — PROTIME-INR
INR: 1 (ref 0.8–1.2)
Prothrombin Time: 13 seconds (ref 11.4–15.2)

## 2021-09-09 LAB — CK: Total CK: 106 U/L (ref 38–234)

## 2021-09-09 LAB — RESP PANEL BY RT-PCR (FLU A&B, COVID) ARPGX2
Influenza A by PCR: NEGATIVE
Influenza B by PCR: NEGATIVE
SARS Coronavirus 2 by RT PCR: NEGATIVE

## 2021-09-09 MED ORDER — SODIUM CHLORIDE 0.9 % IV BOLUS
500.0000 mL | Freq: Once | INTRAVENOUS | Status: AC
  Administered 2021-09-09: 18:00:00 500 mL via INTRAVENOUS

## 2021-09-09 MED ORDER — SENNOSIDES-DOCUSATE SODIUM 8.6-50 MG PO TABS: 1.0000 | ORAL_TABLET | Freq: Every evening | ORAL | Status: DC | PRN

## 2021-09-09 MED ORDER — OYSTER SHELL CALCIUM/D3 500-5 MG-MCG PO TABS
1.0000 | ORAL_TABLET | Freq: Every day | ORAL | Status: DC
  Administered 2021-09-10 – 2021-09-11 (×2): 1 via ORAL
  Filled 2021-09-09 (×2): qty 1

## 2021-09-09 MED ORDER — SIMVASTATIN 20 MG PO TABS
40.0000 mg | ORAL_TABLET | Freq: Every day | ORAL | Status: DC
  Administered 2021-09-10 – 2021-09-11 (×2): 40 mg via ORAL
  Filled 2021-09-09 (×2): qty 2

## 2021-09-09 MED ORDER — SODIUM CHLORIDE 0.9 % IV SOLN
100.0000 mL/h | INTRAVENOUS | Status: DC
  Administered 2021-09-09 – 2021-09-10 (×2): 100 mL/h via INTRAVENOUS

## 2021-09-09 MED ORDER — LORAZEPAM 2 MG/ML IJ SOLN
0.5000 mg | Freq: Once | INTRAMUSCULAR | Status: AC
  Administered 2021-09-09: 22:00:00 0.5 mg via INTRAVENOUS
  Filled 2021-09-09: qty 1

## 2021-09-09 MED ORDER — ADULT MULTIVITAMIN W/MINERALS CH
1.0000 | ORAL_TABLET | Freq: Every day | ORAL | Status: DC
  Administered 2021-09-10 – 2021-09-11 (×2): 1 via ORAL
  Filled 2021-09-09 (×2): qty 1

## 2021-09-09 MED ORDER — ACETAMINOPHEN 500 MG PO TABS
1000.0000 mg | ORAL_TABLET | Freq: Three times a day (TID) | ORAL | Status: DC
  Administered 2021-09-10 – 2021-09-11 (×4): 1000 mg via ORAL
  Filled 2021-09-09 (×4): qty 2

## 2021-09-09 NOTE — ED Notes (Signed)
Patient transported to MRI 

## 2021-09-09 NOTE — H&P (Addendum)
Date: 09/09/2021               Patient Name:  [name] [name] MRN: [id number]  DOB: 1928-10-23 Age / Sex: 88 y.o., female   PCP: Dorothyann Peng, NP         Medical Service: Internal Medicine Teaching Service         Attending Physician: Dr. Jimmye Norman, Elaina Pattee, MD    First Contact: Dr. Scarlett Presto Pager: [phone]  Second Contact: Dr. Virl Axe Pager: [phone]       After Hours (After 5p/  First Contact Pager: [phone]  weekends / holidays): Second Contact Pager: [phone]   Chief Complaint: AMS  History of Present Illness:  [name] [name] is a pleasant 88 year old female with past medical history significant for vascular dementia, prior small infarcts bilateral cerebral white matter, HTN, HLD, T2DM, chronic urinary incontinence, chronic diarrhea, depression, who presents to [facility] ED via EMS for episode of acute altered mental status.  Patient's son, [name] [name], is present at bedside in the ED and provides the history. Patient lives independently and has family members that come by the house daily to help with medications and check in on her. [name] [name] reports patient had an appt with [facility] scheduled for this morning, patient was supposed to take transport to appointment.  Patient missed the appointment and [facility] called [name] [name] who could not see patient in her home on their home camera system. [name] [name] called the police when he was not able to find her at home. She was found two hours later at a neighbor's house about a mile away, and seemed more confused than normal. [facility] was brought to [facility] where she was picked up by EMS.  Notably, patient recently started working with [facility], this was only her fourth day.  At baseline, patient is oriented to self and location.  She uses the restroom, dresses and showers on her own.  Can feed self on her own though forgets to use utensils.  Can no longer use the telephone on her own.  Can hold simple conversations and remembers the faces of her children.  She  has never wandered away from the home before.  The last time patient had acute onset of confusion was when she was diagnosed with a UTI.  Notably, on Tuesday patient did not have an abrasion on her nose.  On Wednesday patient was noted to have an abrasion on her nose and family suspects she fell at some point between Tuesday and Wednesday.  Patient does not recall fall. Patient also has an abrasion on her right arm which [name] [name] said has been there for the last 2 weeks.  Today patient required assistance with walking after she was found, out and wonders if this is due to fatigue after her wandering.  He denies focal weakness or patient leaning to one [name].  He endorses a change in her speech in the last day, patient not answering questions appropriately, some slurred words.  [name] [name] is not aware of the patient having fevers, nausea/vomiting, feeling more fatigued, sleeping more, changes in bowel habits.  He reports patient has had normal p.o. intake.  Not aware of patient complaining of chest pain, shortness of breath, dysuria, urinary frequency.  Patient's son [name] [name] gives patient her long-acting insulin shot in the morning and lays out her other p.o. medications.  [name] [name] helps with administering short acting with meals.  Recently patient has had more difficulty taking p.o. meds and that she will try to chew  the medications.  Taking medications one by one helps.  ED course: On arrival patient afebrile, normotensive and hemodynamically stable on room air.  Patient noted to have aphasia.  CTh negative for intracranial bleed or other intracranial abnormalities.  ED provider concerned about occult stroke versus worsening of dementia, ordered MRI brain.  Medicine called for admission.  Meds:  Current Outpatient Medications  Medication Instructions   Acetaminophen 650 mg, Oral, 3 times daily PRN   aspirin 81 mg, Oral, Daily,     Blood Glucose Calibration (ACCU-CHEK GUIDE CONTROL) LIQD Use as directed   Blood  Glucose Monitoring Suppl (ACCU-CHEK AVIVA PLUS) w/Device KIT Use to check blood sugar 3 times a day   Blood Glucose Monitoring Suppl (ACCU-CHEK GUIDE) w/Device KIT Use as directed   calcium-vitamin D (OSCAL WITH D 500-200) 500-200 MG-UNIT per tablet 1 tablet, Oral, Daily,     Continuous Blood Gluc Sensor (FREESTYLE LIBRE 14 DAY SENSOR) MISC Use to check blood glucose 3 times a day   fluticasone (FLONASE) 50 MCG/ACT nasal spray 1 spray, Nasal, Daily   Glucagon 3 mg, Nasal, Once PRN   glucose blood (ACCU-CHEK AVIVA PLUS) test strip Use to check blood sugar 3 times a day.   glucose blood (ACCU-CHEK GUIDE) test strip CHECK BLOOD SUGAR THREE TIMES DAILY   insulin NPH Human (NOVOLIN N) 28 Units, Subcutaneous, Daily before breakfast, Pens please   Insulin Pen Needle (BD PEN NEEDLE NANO U/F) 32G X 4 MM MISC USE TO INJECT INSULIN 4 TIMES DAILY AS DIRECTED.   Lancets (ONETOUCH ULTRASOFT) lancets Use as instructed to test 3 times daily   Multiple Vitamin (MULTIVITAMIN) tablet 1 tablet, Oral, Daily,     NOVOLOG FLEXPEN 100 UNIT/ML FlexPen Inject under skin 10-12 units 3x a day before meals   simvastatin (ZOCOR) 40 MG tablet TAKE 1 TABLET EVERY DAY   trazodone (DESYREL) 300 MG tablet TAKE 1 TABLET AT BEDTIME   Trulicity 3 mg, Subcutaneous, Weekly   venlafaxine XR (EFFEXOR-XR) 75 MG 24 hr capsule TAKE 1 CAPSULE EVERY DAY  Per patient's son [name] [name], patient is taking Tylenol, trazodone, Effexor, simvastatin, vitamins.  Family assists with long-acting and short acting insulin (only 8 to 10 units).  Patient has not received Trulicity in over a month though previously took this regularly once a week.    Per [facility] Night RN Elmyra Ricks): Trazodone and Effexor not active medications currently, was discontinued 3/7.  Also taking vitamin B supplement, zinc.  For her mealtime insulin, only differences patient getting 12 units with lunch.   Allergies: Allergies as of 09/09/2021 - Review Complete 08/09/2021  Allergen  Reaction Noted   Metformin  07/20/2010   Sitagliptin phosphate  07/20/2010   Past Medical History:  Diagnosis Date   Asthma    DEGENERATIVE JOINT DISEASE, GENERALIZED 02/01/2007   Qualifier: Diagnosis of  By: Burnice Logan  MD, Doretha Sou    DIARRHEA, CHRONIC 08/20/2009   Qualifier: Diagnosis of  By: Burnice Logan  MD, Doretha Sou    DM 02/01/2007   Qualifier: Diagnosis of  By: Floyde Parkins     FATIGUE 10/20/2008   Qualifier: Diagnosis of  By: Burnice Logan  MD, Doretha Sou    HYPERCHOLESTEROLEMIA 02/01/2007   Qualifier: Diagnosis of  By: Floyde Parkins     HYPERLIPIDEMIA 12/20/2007   Qualifier: Diagnosis of  By: Burnice Logan  MD, Doretha Sou    HYPERTENSION 02/01/2007   Qualifier: Diagnosis of  By: Russella Dar BACK PAIN 01/13/2010   Qualifier: Diagnosis  of  By: Burnice Logan  MD, Doretha Sou    MUSCLE CRAMPS 04/06/2010   Qualifier: Diagnosis of  By: Burnice Logan  MD, Doretha Sou    MYALGIA 01/20/2010   Qualifier: Diagnosis of  By: Elease Hashimoto MD, Bruce     OSTEOARTHRITIS 01/13/2010   Qualifier: Diagnosis of  By: Burnice Logan  MD, Doretha Sou    OVERACTIVE BLADDER 08/20/2009   Qualifier: Diagnosis of  By: Burnice Logan  MD, Doretha Sou    PLEURISY 06/18/2008   Qualifier: Diagnosis of  By: Niel Hummer MD, Lorinda Creed    PNEUMONIA 06/18/2008   Qualifier: Diagnosis of  By: Niel Hummer MD, Lorinda Creed    Rectal prolapse 09/20/2007   Qualifier: Diagnosis of  By: Burnice Logan  MD, Doretha Sou    VITAMIN B12 DEFICIENCY 07/20/2010   Qualifier: Diagnosis of  By: Carlean Purl MD, Dimas Millin     Family History:  Family History  Problem Relation Age of Onset   Deep vein thrombosis Mother    COPD Father    Asthma Father    Prostate cancer Brother    Heart attack Brother    Heart attack Maternal Grandfather    Diabetes Neg Hx    Heart disease Neg Hx    Stroke Neg Hx     Social History: At baseline, patient is oriented to self and location.  She uses the restroom, dresses and showers on her own.  Can feed self on her own though forgets to use  utensils.  Requires assistance with all IADLs. Can hold simple conversations and remembers the faces of her children.  Patient is a retired Surveyor, minerals.  Previously worked with a cane though pace noted she was incorrectly using the cane which was a fall risk and therefore cane was discontinued. Previously smoked cigarettes for 5 to 10 years, quit in 1960s.  No alcohol use, no other substance use.  Review of Systems: A complete ROS was negative except as per HPI.   Physical Exam: Blood pressure (!) 152/71, pulse 75, temperature 98.3 F (36.8 C), temperature source Oral, resp. rate 13, SpO2 97 %. Physical Exam: General: Pleasant elderly Caucasian female, resting supine in bed, normal weight, mildly restless, NAD HENT: 1 inch abrasion over bridge of her nose without signs of infection, external nares and ears appear normal, no rhinorrhea EYES: conjunctiva non-erythematous, no scleral icterus CV: regular rate, normal rhythm, no murmurs, rubs, gallops.  2+ pedal pulses. Pulmonary: normal work of breathing on RA, patient did not follow commands for deep breaths, though from what I could auscultate did not appreciate rhonchi, wheezing, rales Abdominal: non-distended, soft, non-tender to palpation, normal BS all 4 quadrants Skin: Warm and dry, 2 cm healing abrasion right forearm without signs of infection Neurological: Mental Status:awake, alert and oriented to self only "[employer]" (maiden name), answers most questions inappropriately, some perseveration, suspect component of both expressive and receptive aphasia, some slurred speech.  Was not able to name or describe relationship to her son. Inconsistently follows simple commands, was able to squeeze fingers bilaterally, though was not able to stick out tongue, give me a thumbs up even with cues. Cranial Nerves: II: Pupils equal, round, and reactive to light.  III,IV, VI: EOMI all cardinal directions, without ptosis.  VII: Face appears symmetric aside  from more pronounced nasolabial fold on right cheek though this may also be due to trauma to the cheek area given no other signs of facial droop on the left except slight flattening of L nasolabial fold. VIII:  Hearing is intact to voice, a bit hard of hearing bilaterally Patient could not follow commands for assessment of cranial nerve IX, X, XII, XI. Motor: moves all extremities antigravity, unable to consistently follow instructions for comprehensive strength exam Sensory: Could not accurately assess Coordination: Unable to follow instructions for FTN or HTS Gait: deferred Psych: normal affect  CBC    Component Value Date/Time   WBC 8.5 09/09/2021 1704   RBC 4.25 09/09/2021 1704   HGB 12.9 09/09/2021 1756   HCT 38.0 09/09/2021 1756   PLT 237 09/09/2021 1704   MCV 94.1 09/09/2021 1704   MCH 31.3 09/09/2021 1704   MCHC 33.3 09/09/2021 1704   RDW 12.4 09/09/2021 1704   LYMPHSABS 2.9 09/09/2021 1704   MONOABS 0.6 09/09/2021 1704   EOSABS 0.1 09/09/2021 1704   BASOSABS 0.1 09/09/2021 1704   CMP     Component Value Date/Time   NA 137 09/09/2021 1756   K 4.6 09/09/2021 1756   CL 103 09/09/2021 1756   CO2 25 09/09/2021 1704   GLUCOSE 118 (H) 09/09/2021 1756   BUN 32 (H) 09/09/2021 1756   CREATININE 1.50 (H) 09/09/2021 1756   CREATININE 1.81 (H) 04/08/2020 1421   CALCIUM 9.4 09/09/2021 1704   PROT 6.9 09/09/2021 1704   ALBUMIN 3.5 09/09/2021 1704   AST 60 (H) 09/09/2021 1704   ALT 28 09/09/2021 1704   ALKPHOS 39 09/09/2021 1704   BILITOT 1.2 09/09/2021 1704   GFRNONAA 33 (L) 09/09/2021 1704   GFRNONAA 25 (L) 04/08/2020 1421   GFRAA 28 (L) 04/08/2020 1421   Urine rapid drug screen (hosp performed) [[id number]] Collected: 09/09/21 1902  Specimen: Urine Updated: 09/09/21 1955   Opiates NONE DETECTED   Cocaine NONE DETECTED   Benzodiazepines NONE DETECTED   Amphetamines NONE DETECTED   Tetrahydrocannabinol NONE DETECTED   Barbiturates NONE DETECTED   Ethanol [[id number]]  Collected: 09/09/21 1704  Specimen: Blood from Vein Updated: 09/09/21 1755   Alcohol, Ethyl (B) <10 mg/dL    Urinalysis, Routine w reflex microscopic [[id number]] (Abnormal) Collected: 09/09/21 1902  Specimen: Urine Updated: 09/09/21 1921   Color, Urine YELLOW   APPearance CLEAR   Specific Gravity, Urine 1.018   pH 5.0   Glucose, UA 50 Abnormal  mg/dL    Hgb urine dipstick NEGATIVE   Bilirubin Urine NEGATIVE   Ketones, ur NEGATIVE mg/dL    Protein, ur 100 Abnormal  mg/dL    Nitrite NEGATIVE   Leukocytes,Ua NEGATIVE   RBC / HPF 0-5 RBC/hpf    WBC, UA 0-5 WBC/hpf    Bacteria, UA RARE Abnormal    Squamous Epithelial / LPF 0-5   Mucus PRESENT   Resp Panel by RT-PCR (Flu A&B, Covid) Nasopharyngeal Swab [[id number]] Collected: 09/09/21 1709  Specimen: Nasopharyngeal(NP) swabs in vial transport medium from Nasopharyngeal Swab Updated: 09/09/21 1818   SARS Coronavirus 2 by RT PCR NEGATIVE   Influenza A by PCR NEGATIVE   Influenza B by PCR NEGATIVE   CK [[id number]] Collected: 09/09/21 1704  Specimen: Blood from Vein Updated: 09/09/21 1806   Total CK 106 U/L    EKG: personally reviewed my interpretation is NSR, tiny q waves in anterior and inferior leads, no new ischemic changes or other changes from prior EKG  CXR: personally reviewed my interpretation is elevated right hemidiaphragm, normal heart size, no opacities, no pleural effusions  CT HEAD WO CONTRAST Result Date: 09/09/2021 FINDINGS: Brain: Stable age related cerebral atrophy, ventriculomegaly and periventricular white matter disease. No extra-axial fluid collections  are identified. No CT findings for acute hemispheric infarction or intracranial hemorrhage. No mass lesions. The brainstem and cerebellum are normal. Vascular: Vascular calcifications but no aneurysm hyperdense vessels. Skull: No skull fracture or bone lesions. Sinuses/Orbits: The paranasal sinuses and mastoid air cells are clear. The globes are intact. Other: No scalp  lesions or scalp hematoma. IMPRESSION: 1. Stable age related cerebral atrophy, ventriculomegaly and periventricular white matter disease. 2. No acute intracranial findings or skull fracture. Electronically Signed   By: Marijo Sanes M.D.   On: 09/09/2021 17:49    Assessment & Plan by Problem: Principal Problem:   AMS (altered mental status) [name] [name] is a 88 year old female with PMHx significant for vascular dementia, prior small infarcts bilateral cerebral white matter, HTN, HLD, T2DM, chronic urinary incontinence, chronic diarrhea, depression, who presented for episode of acute altered mental status.  #Altered Mental Status #Hx Vascular Dementia Family reports acute change in mental status today, patient found wandering outside, confused, slurred speech, evidence of aphasia on exam.  I do suspect patient has had a progression of her vascular dementia though there is certainly evidence of an acute change in mental status today requiring further work-up.  Differential initially broad given recent history of fall with possible head trauma, though CTH negative for acute intracranial abnormalities.  Underlying pain could be contributing to confusion in this patient with known vascular dementia. No metabolic abnormalities identified, UDS and ethanol negative.  UA with rare bacteria though otherwise unremarkable.  Patient has had acute AMS in the past with UTI therefore this remains on the differential.  No other possible sources of infection identified and patient has been afebrile, no leukocytosis, hemodynamically stable. Given history of prior small CVAs, occult stroke remains on the differential which would explain aphasia and slurred speech.  Patient's Effexor was also recently discontinued which could cause antidepressant discontinuation syndrome, though per patient's son [name] [name] he has continued to given this medication.  Patient is also on insulin regimen there for hypoglycemia is on the  differential though on arrival patient's glucose normal. Overall, AMS may be multifactorial, patient will be admitted to observation for further work-up. Plan: -MRI brain WO, plan to consult neurology if imaging shows new stroke -Hold centrally acting medications: Effexor and trazodone, unless clinical suspicion of antidepressant discontinuation syndrome is high -Clarify with [facility]/family the process of Effexor discontinuation -Follow-up urine culture -Consider dose of IV ceftriaxone for empiric treatment of UTI if clinical suspicion is high - f/u TSH -Delirium precautions -NPO, diet pending SLP consult -Pain management with scheduled Tylenol -PT/OT eval and treat  #Type 2 diabetes mellitus Last hemoglobin A1c 9.5% back in December 2022.  Patient currently on NPH insulin 28 units daily, NovoLog 8 to 10 units mealtime, has not been on Trulicity for the last month due to change in insurance.  CBG on arrival normal. Plan: -Semglee 12 units nightly -Moderate SSI -CBGs mealtime and nightly  #HLD Continued on simvastatin 40 mg daily  #Prior small CVAs (2022) #Vascular dementia #Frequent falls On baby aspirin and statin at home.  Seems patient has had frequent falls in the last year, 2 abrasions 1 from Tuesday and 1 from last week concerning for patient frequently falling at home.  No generalized weakness or balance difficulties noted by family.  Concern for progression of vascular dementia with work-up for acute AMS as above.  Plan: -Holding aspirin due to concern for ICH -Would benefit from PT/OT eval as above  #HTN Not on antihypertensive currently. Normotensive thus far.  Diet: NPO VTE: SCDs, concern for CVA, ICH IVF:  None Code: DNR  Dispo: Admit patient to Observation with expected length of stay less than 2 midnights.  Portions of this report may have been transcribed using voice recognition software. Every effort was made to ensure accuracy; however, inadvertent computerized  transcription errors may be present.   Signed: Wayland Denis, MD 09/09/2021, 11:11 PM  Pager: [phone] After 5pm on weekdays and 1pm on weekends: On Call pager: [phone]  ADDENDUM: MR Brain WO showed a punctate 4 mm focus of diffusion abnormality involving the right periatrial white matter, likely a small acute small vessel type infarct. Neurology Dr. Theda Sers consulted for likely new small infarct. Given size of likely infarct, suspect that patient has other underlying process contributing to AMS. Continue workup as above and follow up Neurology recommendations, appreciate their assistance.

## 2021-09-09 NOTE — ED Triage Notes (Signed)
[facility]'s [facility]. Per EMS, pt was reported to have fallen sometime Tuesday and last night family noticed pt becoming more agitated and confused. Pt has dementia at baseline but unable to get accurate baseline however family did state to EMS pt is more confused than normal. Pt lives alone and had wandered for approx 2 hours until found which has not occurred in the past. Pt arrived to ED alert to person with confusion.  ?

## 2021-09-09 NOTE — ED Provider Notes (Signed)
[facility] EMERGENCY DEPARTMENT Provider Note   CSN: [id number] Arrival date & time: 09/09/21  1650     History  Chief Complaint  Patient presents with   Altered Mental Status    [name] [name] is a 88 y.o. female.   Altered Mental Status Associated symptoms: no fever    Patient presents to the ED for evaluation of altered mental status.  Patient resides at home by herself.  Family members check on her.  According to the EMS report the patient fell sometime on Tuesday.  Last night the family noticed the patient was starting to become somewhat more agitated and confused.  Today patient apparently wandered off from the house for about 2 hours until she was found.  Patient has never done this before in the past.  Patient went to the primary care doctor's office and was sent there to the ED.  Notes from the primary care doctor's office reviewed.  Includes information about past medical history and demographics but no specific information about today.  Patient herself is not able to answer any questions about what specifically happened recently.  She denies any specific complaints right now but is having some difficulty communicating.  Home Medications Prior to Admission medications   Medication Sig Start Date End Date Taking? Authorizing Provider  Acetaminophen 650 MG TABS Take 1 tablet (650 mg total) by mouth 3 (three) times daily as needed. 12/19/12   Moreno-Coll, Adlih, MD  aspirin 81 MG tablet Take 81 mg by mouth daily.    [provider]  Blood Glucose Calibration (ACCU-CHEK GUIDE CONTROL) LIQD Use as directed 05/07/20   Dorothyann Peng, NP  Blood Glucose Monitoring Suppl (ACCU-CHEK AVIVA PLUS) w/Device KIT Use to check blood sugar 3 times a day 08/15/19   Philemon Kingdom, MD  Blood Glucose Monitoring Suppl (ACCU-CHEK GUIDE) w/Device KIT Use as directed 05/07/20   Dorothyann Peng, NP  calcium-vitamin D (OSCAL WITH D 500-200) 500-200 MG-UNIT per tablet Take  1 tablet by mouth daily.    [provider]  Continuous Blood Gluc Sensor (FREESTYLE LIBRE 14 DAY SENSOR) MISC Use to check blood glucose 3 times a day 09/30/20   Philemon Kingdom, MD  Dulaglutide (TRULICITY) 3 SE/8.3TD SOPN Inject 3 mg into the skin once a week. 06/11/21   Philemon Kingdom, MD  fluticasone (FLONASE) 50 MCG/ACT nasal spray Place 1 spray into the nose daily. 12/19/12   Moreno-Coll, Adlih, MD  Glucagon 3 MG/DOSE POWD Place 3 mg into the nose once as needed for up to 1 dose. 09/11/20   Philemon Kingdom, MD  glucose blood (ACCU-CHEK AVIVA PLUS) test strip Use to check blood sugar 3 times a day. 08/15/19   Philemon Kingdom, MD  glucose blood (ACCU-CHEK GUIDE) test strip CHECK BLOOD SUGAR THREE TIMES DAILY 09/02/20   Philemon Kingdom, MD  insulin NPH Human (NOVOLIN N) 100 UNIT/ML injection Inject 0.28 mLs (28 Units total) into the skin daily before breakfast. Pens please 06/11/21   Philemon Kingdom, MD  Insulin Pen Needle (BD PEN NEEDLE NANO U/F) 32G X 4 MM MISC USE TO INJECT INSULIN 4 TIMES DAILY AS DIRECTED. 08/15/19   Philemon Kingdom, MD  Lancets [facility] ULTRASOFT) lancets Use as instructed to test 3 times daily 08/15/19   Philemon Kingdom, MD  Multiple Vitamin (MULTIVITAMIN) tablet Take 1 tablet by mouth daily.    [provider]  NOVOLOG FLEXPEN 100 UNIT/ML FlexPen Inject under skin 10-12 units 3x a day before meals 06/11/21  Philemon Kingdom, MD  simvastatin (ZOCOR) 40 MG tablet TAKE 1 TABLET EVERY DAY 03/10/21   Nafziger, Tommi Rumps, NP  trazodone (DESYREL) 300 MG tablet TAKE 1 TABLET AT BEDTIME 04/06/21   Nafziger, Tommi Rumps, NP  venlafaxine XR (EFFEXOR-XR) 75 MG 24 hr capsule TAKE 1 CAPSULE EVERY DAY 04/06/21   Nafziger, Tommi Rumps, NP      Allergies    Metformin and Sitagliptin phosphate    Review of Systems   Review of Systems  Constitutional:  Negative for fever.   Physical Exam Updated Vital Signs BP 99/80    Pulse 80    Temp 98.3 F (36.8 C) (Oral)    Resp (!) 23     SpO2 97%  Physical Exam Vitals and nursing note reviewed.  Constitutional:      General: She is not in acute distress.    Appearance: She is well-developed.  HENT:     Head: Normocephalic and atraumatic.     Right Ear: External ear normal.     Left Ear: External ear normal.  Eyes:     General: No scleral icterus.       Right eye: No discharge.        Left eye: No discharge.     Conjunctiva/sclera: Conjunctivae normal.  Neck:     Trachea: No tracheal deviation.  Cardiovascular:     Rate and Rhythm: Normal rate and regular rhythm.  Pulmonary:     Effort: Pulmonary effort is normal. No respiratory distress.     Breath sounds: Normal breath sounds. No stridor. No wheezing or rales.  Abdominal:     General: Bowel sounds are normal. There is no distension.     Palpations: Abdomen is soft.     Tenderness: There is no abdominal tenderness. There is no guarding or rebound.  Musculoskeletal:        General: No tenderness.     Cervical back: Neck supple.  Skin:    General: Skin is warm and dry.     Findings: No rash.  Neurological:     Mental Status: She is alert and oriented to person, place, and time.     GCS: GCS eye subscore is 4. GCS verbal subscore is 4. GCS motor subscore is 6.     Cranial Nerves: No dysarthria.     Sensory: No sensory deficit.     Motor: No abnormal muscle tone or seizure activity.     Coordination: Coordination normal.     Comments: Weak throughout, it is able to lift left arm without difficulty, after repeat questioning eventually was able to lift her right arm, difficulty understanding commands regarding moving her legs but will lift both slightly off the bed right does appear somewhat weaker than left, sensation intact in all extremities, no visual field cuts, no left or right sided neglect, , no nystagmus noted  No facial droop, extraocular movements intact, tongue midline  aphasia noted, having difficulty naming objects such as watching television     ED Results / Procedures / Treatments   Labs (all labs ordered are listed, but only abnormal results are displayed) Labs Reviewed  APTT - Abnormal; Notable for the following components:      Result Value   aPTT 23 (*)    All other components within normal limits  COMPREHENSIVE METABOLIC PANEL - Abnormal; Notable for the following components:   Glucose, Bld 118 (*)    Creatinine, Ser 1.48 (*)    AST 60 (*)    GFR, Estimated 33 (*)  All other components within normal limits  URINALYSIS, ROUTINE W REFLEX MICROSCOPIC - Abnormal; Notable for the following components:   Glucose, UA 50 (*)    Protein, ur 100 (*)    Bacteria, UA RARE (*)    All other components within normal limits  I-STAT CHEM 8, ED - Abnormal; Notable for the following components:   BUN 32 (*)    Creatinine, Ser 1.50 (*)    Glucose, Bld 118 (*)    Calcium, Ion 1.12 (*)    All other components within normal limits  RESP PANEL BY RT-PCR (FLU A&B, COVID) ARPGX2  ETHANOL  PROTIME-INR  CBC  DIFFERENTIAL  RAPID URINE DRUG SCREEN, HOSP PERFORMED  CK    EKG EKG Interpretation  Date/Time:  Thursday September 09 2021 17:53:27 EST Ventricular Rate:  71 PR Interval:  143 QRS Duration: 90 QT Interval:  397 QTC Calculation: 432 R Axis:   -65 Text Interpretation: Sinus rhythm Inferior infarct, old Anterior infarct, old Since last tracing rate slower Confirmed by Dorie Rank [phone]) on 09/09/2021 6:16:17 PM  Radiology CT HEAD WO CONTRAST  Result Date: 09/09/2021 CLINICAL DATA:  History of a recent fall.  Mental status change. EXAM: CT HEAD WITHOUT CONTRAST TECHNIQUE: Contiguous axial images were obtained from the base of the skull through the vertex without intravenous contrast. RADIATION DOSE REDUCTION: This exam was performed according to the departmental dose-optimization program which includes automated exposure control, adjustment of the mA and/or kV according to patient size and/or use of iterative reconstruction  technique. COMPARISON:  MRI brain 09/04/2020 FINDINGS: Brain: Stable age related cerebral atrophy, ventriculomegaly and periventricular white matter disease. No extra-axial fluid collections are identified. No CT findings for acute hemispheric infarction or intracranial hemorrhage. No mass lesions. The brainstem and cerebellum are normal. Vascular: Vascular calcifications but no aneurysm hyperdense vessels. Skull: No skull fracture or bone lesions. Sinuses/Orbits: The paranasal sinuses and mastoid air cells are clear. The globes are intact. Other: No scalp lesions or scalp hematoma. IMPRESSION: 1. Stable age related cerebral atrophy, ventriculomegaly and periventricular white matter disease. 2. No acute intracranial findings or skull fracture. Electronically Signed   By: Marijo Sanes M.D.   On: 09/09/2021 17:49   DG Chest Portable 1 View  Result Date: 09/09/2021 CLINICAL DATA:  Weakness. EXAM: PORTABLE CHEST 1 VIEW COMPARISON:  Chest x-ray 03/30/2016 there is stable mild FINDINGS: Elevation of the right hemidiaphragm. The heart size and mediastinal contours are within normal limits. Both lungs are clear. The visualized skeletal structures are unremarkable. IMPRESSION: No active disease. Electronically Signed   By: Ronney Asters M.D.   On: 09/09/2021 17:22    Procedures Procedures    Medications Ordered in ED Medications  sodium chloride 0.9 % bolus 500 mL (0 mLs Intravenous Stopped 09/09/21 1830)    Followed by  0.9 %  sodium chloride infusion (100 mL/hr Intravenous New Bag/Given 09/09/21 1833)  LORazepam (ATIVAN) injection 0.5 mg (has no administration in time range)    ED Course/ Medical Decision Making/ A&P Clinical Course as of 09/09/21 2001  Thu Sep 09, 2021  1818 Resp Panel by RT-PCR (Flu A&B, Covid) Nasopharyngeal Swab Covid and flu are negative [JK]  1819 Comprehensive metabolic panel(!) Creatinine is elevated but similar to previous values [JK]  1819 CK Normal [JK]  1819 CBC Normal  [JK]  1819 CT HEAD WO CONTRAST Head CT without acute findings [JK]  1820 DG Chest Portable 1 View Chest x-ray images and radiology report reviewed.  No acute findings. [  JK]  1838 D/w [facility].  Pt goes to im teaching service [JK]  1959 D/w IM service.  Will admit for further workup [JK]    Clinical Course User Index [JK] Dorie Rank, MD                           Medical Decision Making Amount and/or Complexity of Data Reviewed Labs: ordered. Decision-making details documented in ED Course. Radiology: ordered. Decision-making details documented in ED Course.  Risk Prescription drug management.  Presented to the ED for evaluation of altered mental status.  Patient has history of dementia but family have noticed an acute change in her speech and her confusion.  Broad differential including metabolic derangements, stroke, infection.  Initial ED work-up without definitive signs for her confusion.  There is no signs of urinary tract infection.  She does not have COVID or flu.  No significant electrolyte abnormalities.  No signs of anemia or dehydration.  Head CT did not show any acute abnormalities including subdural subarachnoid hemorrhage with her recent fall.  I am concerned for the possibility of an occult stroke with her speech disturbance.  It is also possible that this is worsening of her dementia although I do not have a clear indication why this would abruptly change.  I think the patient would benefit from hospitalization and further work-up.  Case was discussed with the internal medicine teaching service.          Final Clinical Impression(s) / ED Diagnoses Final diagnoses:  Altered mental status, unspecified altered mental status type  Dementia, unspecified dementia severity, unspecified dementia type, unspecified whether behavioral, psychotic, or mood disturbance or anxiety ([place])     Dorie Rank, MD 09/09/21 2001

## 2021-09-09 NOTE — Hospital Course (Addendum)
? ?  Normal tone, squeezing fingers inconsistently. Able to wiggle toes. Perseveration. Decreased skin turgor. ? ?_____________________________ ?Meds:  ?[employer]: Hasn't gotten Trulicity (about a month), Effexor, 1/2 at night Trazadone, takes zocor, tylenol, ASA, multivit, combo Oscal, 28 U of novolin, SSI,  ? ?SOCIAL HISTORY:  ?Tobacco: None, Last smoke 1963,  ?ETOH: None ?Drugs: None  ? ? ?Alert to self, not age, at home, knows son(?),  ? ?Difficulty following commands ? ?[facility] name) ? ?Glassy eyes ? ?R arm scab a week old  ? ?

## 2021-09-10 ENCOUNTER — Observation Stay (HOSPITAL_COMMUNITY): Payer: Medicare (Managed Care)

## 2021-09-10 DIAGNOSIS — F0390 Unspecified dementia without behavioral disturbance: Secondary | ICD-10-CM | POA: Diagnosis not present

## 2021-09-10 DIAGNOSIS — G9341 Metabolic encephalopathy: Secondary | ICD-10-CM

## 2021-09-10 DIAGNOSIS — E1165 Type 2 diabetes mellitus with hyperglycemia: Secondary | ICD-10-CM | POA: Diagnosis present

## 2021-09-10 DIAGNOSIS — E11649 Type 2 diabetes mellitus with hypoglycemia without coma: Secondary | ICD-10-CM

## 2021-09-10 DIAGNOSIS — N3281 Overactive bladder: Secondary | ICD-10-CM | POA: Diagnosis present

## 2021-09-10 DIAGNOSIS — Z825 Family history of asthma and other chronic lower respiratory diseases: Secondary | ICD-10-CM | POA: Diagnosis not present

## 2021-09-10 DIAGNOSIS — I639 Cerebral infarction, unspecified: Secondary | ICD-10-CM

## 2021-09-10 DIAGNOSIS — Z8249 Family history of ischemic heart disease and other diseases of the circulatory system: Secondary | ICD-10-CM | POA: Diagnosis not present

## 2021-09-10 DIAGNOSIS — R4701 Aphasia: Secondary | ICD-10-CM | POA: Diagnosis present

## 2021-09-10 DIAGNOSIS — F0150 Vascular dementia without behavioral disturbance: Secondary | ICD-10-CM | POA: Diagnosis present

## 2021-09-10 DIAGNOSIS — I6389 Other cerebral infarction: Secondary | ICD-10-CM

## 2021-09-10 DIAGNOSIS — I6349 Cerebral infarction due to embolism of other cerebral artery: Secondary | ICD-10-CM | POA: Diagnosis present

## 2021-09-10 DIAGNOSIS — Z66 Do not resuscitate: Secondary | ICD-10-CM | POA: Diagnosis present

## 2021-09-10 DIAGNOSIS — Z20822 Contact with and (suspected) exposure to covid-19: Secondary | ICD-10-CM | POA: Diagnosis present

## 2021-09-10 DIAGNOSIS — J45909 Unspecified asthma, uncomplicated: Secondary | ICD-10-CM | POA: Diagnosis present

## 2021-09-10 DIAGNOSIS — R410 Disorientation, unspecified: Secondary | ICD-10-CM

## 2021-09-10 DIAGNOSIS — Z9183 Wandering in diseases classified elsewhere: Secondary | ICD-10-CM | POA: Diagnosis not present

## 2021-09-10 DIAGNOSIS — Z7982 Long term (current) use of aspirin: Secondary | ICD-10-CM | POA: Diagnosis not present

## 2021-09-10 DIAGNOSIS — R4182 Altered mental status, unspecified: Secondary | ICD-10-CM

## 2021-09-10 DIAGNOSIS — Z79899 Other long term (current) drug therapy: Secondary | ICD-10-CM | POA: Diagnosis not present

## 2021-09-10 DIAGNOSIS — I6330 Cerebral infarction due to thrombosis of unspecified cerebral artery: Secondary | ICD-10-CM | POA: Insufficient documentation

## 2021-09-10 DIAGNOSIS — R296 Repeated falls: Secondary | ICD-10-CM | POA: Diagnosis present

## 2021-09-10 DIAGNOSIS — Z794 Long term (current) use of insulin: Secondary | ICD-10-CM | POA: Diagnosis not present

## 2021-09-10 DIAGNOSIS — E7800 Pure hypercholesterolemia, unspecified: Secondary | ICD-10-CM | POA: Diagnosis present

## 2021-09-10 DIAGNOSIS — E538 Deficiency of other specified B group vitamins: Secondary | ICD-10-CM | POA: Diagnosis present

## 2021-09-10 DIAGNOSIS — I10 Essential (primary) hypertension: Secondary | ICD-10-CM | POA: Diagnosis present

## 2021-09-10 DIAGNOSIS — R32 Unspecified urinary incontinence: Secondary | ICD-10-CM | POA: Diagnosis present

## 2021-09-10 DIAGNOSIS — R29721 NIHSS score 21: Secondary | ICD-10-CM | POA: Diagnosis present

## 2021-09-10 DIAGNOSIS — Z87891 Personal history of nicotine dependence: Secondary | ICD-10-CM | POA: Diagnosis not present

## 2021-09-10 LAB — LIPID PANEL
Cholesterol: 175 mg/dL (ref 0–200)
HDL: 44 mg/dL (ref >40)
LDL Cholesterol: 97 mg/dL (ref 0–99)
Total CHOL/HDL Ratio: 4 RATIO
Triglycerides: 168 mg/dL — ABNORMAL HIGH (ref <150)
VLDL: 34 mg/dL (ref 0–40)

## 2021-09-10 LAB — GLUCOSE, CAPILLARY
Glucose-Capillary: 68 mg/dL — ABNORMAL LOW (ref 70–99)
Glucose-Capillary: 91 mg/dL (ref 70–99)
Glucose-Capillary: 130 mg/dL — ABNORMAL HIGH (ref 70–99)
Glucose-Capillary: 134 mg/dL — ABNORMAL HIGH (ref 70–99)
Glucose-Capillary: 216 mg/dL — ABNORMAL HIGH (ref 70–99)
Glucose-Capillary: 282 mg/dL — ABNORMAL HIGH (ref 70–99)
Glucose-Capillary: 293 mg/dL — ABNORMAL HIGH (ref 70–99)

## 2021-09-10 LAB — COMPREHENSIVE METABOLIC PANEL
ALT: 22 U/L (ref 0–44)
AST: 35 U/L (ref 15–41)
Albumin: 3 g/dL — ABNORMAL LOW (ref 3.5–5.0)
Alkaline Phosphatase: 34 U/L — ABNORMAL LOW (ref 38–126)
Anion gap: 7 (ref 5–15)
BUN: 17 mg/dL (ref 8–23)
CO2: 26 mmol/L (ref 22–32)
Calcium: 8.4 mg/dL — ABNORMAL LOW (ref 8.9–10.3)
Chloride: 103 mmol/L (ref 98–111)
Creatinine, Ser: 1.3 mg/dL — ABNORMAL HIGH (ref 0.44–1.00)
GFR, Estimated: 39 mL/min — ABNORMAL LOW (ref >60)
Glucose, Bld: 134 mg/dL — ABNORMAL HIGH (ref 70–99)
Potassium: 4.1 mmol/L (ref 3.5–5.1)
Sodium: 136 mmol/L (ref 135–145)
Total Bilirubin: 0.5 mg/dL (ref 0.3–1.2)
Total Protein: 6.3 g/dL — ABNORMAL LOW (ref 6.5–8.1)

## 2021-09-10 LAB — CBC
HCT: 34.4 % — ABNORMAL LOW (ref 36.0–46.0)
Hemoglobin: 11.7 g/dL — ABNORMAL LOW (ref 12.0–15.0)
MCH: 31.1 pg (ref 26.0–34.0)
MCHC: 34 g/dL (ref 30.0–36.0)
MCV: 91.5 fL (ref 80.0–100.0)
Platelets: 201 10*3/uL (ref 150–400)
RBC: 3.76 MIL/uL — ABNORMAL LOW (ref 3.87–5.11)
RDW: 12.4 % (ref 11.5–15.5)
WBC: 7.8 10*3/uL (ref 4.0–10.5)
nRBC: 0 % (ref 0.0–0.2)

## 2021-09-10 LAB — ECHOCARDIOGRAM COMPLETE
AR max vel: 1.7 cm2
AV Area VTI: 1.42 cm2
AV Area mean vel: 1.66 cm2
AV Mean grad: 9 mmHg
AV Peak grad: 15.1 mmHg
Ao pk vel: 1.94 m/s
Area-P 1/2: 2.82 cm2
Calc EF: 57.1 %
P 1/2 time: 643 msec
S' Lateral: 2.3 cm
Single Plane A2C EF: 56.8 %
Single Plane A4C EF: 57.7 %

## 2021-09-10 LAB — HEMOGLOBIN A1C
Hgb A1c MFr Bld: 9.2 % — ABNORMAL HIGH (ref 4.8–5.6)
Mean Plasma Glucose: 217.34 mg/dL

## 2021-09-10 LAB — AMMONIA: Ammonia: 13 umol/L (ref 9–35)

## 2021-09-10 LAB — TSH: TSH: 0.499 u[IU]/mL (ref 0.350–4.500)

## 2021-09-10 MED ORDER — VENLAFAXINE HCL ER 37.5 MG PO CP24
37.5000 mg | ORAL_CAPSULE | Freq: Every day | ORAL | Status: DC
  Administered 2021-09-11: 37.5 mg via ORAL
  Filled 2021-09-10: qty 1

## 2021-09-10 MED ORDER — ASPIRIN EC 81 MG PO TBEC
81.0000 mg | DELAYED_RELEASE_TABLET | Freq: Every day | ORAL | Status: DC
  Administered 2021-09-10 – 2021-09-11 (×2): 81 mg via ORAL
  Filled 2021-09-10 (×2): qty 1

## 2021-09-10 MED ORDER — LORAZEPAM 0.5 MG PO TABS: 0.5000 mg | ORAL_TABLET | Freq: Once | ORAL | Status: DC

## 2021-09-10 MED ORDER — CLOPIDOGREL BISULFATE 75 MG PO TABS
75.0000 mg | ORAL_TABLET | Freq: Every day | ORAL | Status: DC
Start: 2021-09-10 — End: 2021-09-10

## 2021-09-10 MED ORDER — CLOPIDOGREL BISULFATE 75 MG PO TABS
75.0000 mg | ORAL_TABLET | Freq: Every day | ORAL | Status: DC
  Administered 2021-09-10 – 2021-09-11 (×2): 75 mg via ORAL
  Filled 2021-09-10 (×2): qty 1

## 2021-09-10 MED ORDER — INSULIN GLARGINE-YFGN 100 UNIT/ML ~~LOC~~ SOLN
12.0000 [IU] | Freq: Every day | SUBCUTANEOUS | Status: DC
  Administered 2021-09-10: 12 [IU] via SUBCUTANEOUS
  Filled 2021-09-10 (×3): qty 0.12

## 2021-09-10 MED ORDER — LACTATED RINGERS IV BOLUS
1000.0000 mL | Freq: Once | INTRAVENOUS | Status: AC
  Administered 2021-09-10: 1000 mL via INTRAVENOUS

## 2021-09-10 MED ORDER — INSULIN ASPART 100 UNIT/ML IJ SOLN
0.0000 [IU] | Freq: Three times a day (TID) | INTRAMUSCULAR | Status: DC
  Administered 2021-09-10: 2 [IU] via SUBCUTANEOUS
  Administered 2021-09-10: 5 [IU] via SUBCUTANEOUS
  Administered 2021-09-10: 8 [IU] via SUBCUTANEOUS
  Administered 2021-09-11: 5 [IU] via SUBCUTANEOUS
  Administered 2021-09-11: 11 [IU] via SUBCUTANEOUS

## 2021-09-10 NOTE — Progress Notes (Signed)
Pt admitted for AMS, pt alert to self. Pt on tele NSR, pt has only  small abrasion on right forearm and bridge of nose. Pt oriented to unit, no c/o of pain. Pt difficult to redirect at times. 24 bed rails \\up , bed in lowest position, wheels locked and call bell near by ?No other needs voiced at this time. ?Louanne Skye ?09/10/21 ? ? ?4:32 AM ?  ?

## 2021-09-10 NOTE — Progress Notes (Signed)
Spoke with [facility], Dr. Bradd Burner, about patient's progress in efforts to devise a safe discharge plan. Dr. Bradd Burner noted that [facility] is a community-based program and that patient's family committed to an agreement to provide a certain amount of supervision to keep patient in the community as opposed to in a SNF/long-term memory care. He notes that it would be safer to discharge on a weekday (once medically stable) as there would be more staff. Patient can be discharged directly to [facility] and they will continue care into the outpatient setting. Will discuss with family about this in order to ensure that everyone is on the same page. ?

## 2021-09-10 NOTE — Progress Notes (Addendum)
STROKE TEAM PROGRESS NOTE   INTERVAL HISTORY No family at the bedside. Some garbled speech. Denies pain, hard of hearing. No visual field deficit. Able to add fingers. Baseline vascular dementia and is a [facility] patient.  She was found wandering by family who do check on her daily, this is the first time she has left home like this.  The patient herself was not able to tell us what happened.  Vitals:   09/09/21 2200 09/09/21 2342 09/10/21 0321 09/10/21 0759  BP: (!) 152/71 (!) 146/76 137/75 (!) 126/49  Pulse: 75 91 92 74  Resp: 13 19 16 18   Temp:  98.6 F (37 C) 98.2 F (36.8 C) 98.8 F (37.1 C)  TempSrc:  Oral Oral Oral  SpO2: 97% 99% 96% 97%   CBC:  Recent Labs  Lab 09/09/21 1704 09/09/21 1756 09/10/21 0752  WBC 8.5  --  7.8  NEUTROABS 4.8  --   --   HGB 13.3 12.9 11.7*  HCT 40.0 38.0 34.4*  MCV 94.1  --  91.5  PLT 237  --  093   Basic Metabolic Panel:  Recent Labs  Lab 09/09/21 1704 09/09/21 1756 09/10/21 0752  NA 137 137 136  K 4.6 4.6 4.1  CL 101 103 103  CO2 25  --  26  GLUCOSE 118* 118* 134*  BUN 23 32* 17  CREATININE 1.48* 1.50* 1.30*  CALCIUM 9.4  --  8.4*   Lipid Panel:  Recent Labs  Lab 09/10/21 0752  CHOL 175  TRIG 168*  HDL 44  CHOLHDL 4.0  VLDL 34  LDLCALC 97   HgbA1c:  Recent Labs  Lab 09/10/21 0752  HGBA1C 9.2*   Urine Drug Screen:  Recent Labs  Lab 09/09/21 1902  LABOPIA NONE DETECTED  COCAINSCRNUR NONE DETECTED  LABBENZ NONE DETECTED  AMPHETMU NONE DETECTED  THCU NONE DETECTED  LABBARB NONE DETECTED    Alcohol Level  Recent Labs  Lab 09/09/21 1704  ETH <10    IMAGING past 24 hours CT HEAD WO CONTRAST  Result Date: 09/09/2021 CLINICAL DATA:  History of a recent fall.  Mental status change. EXAM: CT HEAD WITHOUT CONTRAST TECHNIQUE: Contiguous axial images were obtained from the base of the skull through the vertex without intravenous contrast. RADIATION DOSE REDUCTION: This exam was performed according to the departmental  dose-optimization program which includes automated exposure control, adjustment of the mA and/or kV according to patient size and/or use of iterative reconstruction technique. COMPARISON:  MRI brain 09/04/2020 FINDINGS: Brain: Stable age related cerebral atrophy, ventriculomegaly and periventricular white matter disease. No extra-axial fluid collections are identified. No CT findings for acute hemispheric infarction or intracranial hemorrhage. No mass lesions. The brainstem and cerebellum are normal. Vascular: Vascular calcifications but no aneurysm hyperdense vessels. Skull: No skull fracture or bone lesions. Sinuses/Orbits: The paranasal sinuses and mastoid air cells are clear. The globes are intact. Other: No scalp lesions or scalp hematoma. IMPRESSION: 1. Stable age related cerebral atrophy, ventriculomegaly and periventricular white matter disease. 2. No acute intracranial findings or skull fracture. Electronically Signed   By: Marijo Sanes M.D.   On: 09/09/2021 17:49   MR BRAIN WO CONTRAST  Result Date: 09/09/2021 CLINICAL DATA:  Initial evaluation for neuro deficit, stroke suspected. Altered mental status. EXAM: MRI HEAD WITHOUT CONTRAST TECHNIQUE: Multiplanar, multiecho pulse sequences of the brain and surrounding structures were obtained without intravenous contrast. COMPARISON:  Prior CT from earlier the same day. FINDINGS: Brain: Examination technically limited as the patient  was unable to tolerate the full length of the study. Diffusion-weighted sequences, T2 and FLAIR sequences, and sagittal T1 weighted sequence only were performed. Additionally, provided images are moderately to severely degraded by motion artifact. Generalized age-related cerebral atrophy with moderate chronic microvascular ischemic disease. Punctate 4 mm focus of diffusion abnormality seen involving the right periatrial white matter, likely a small acute small vessel type infarct (series 7, image 85). No associated mass effect.  No visible hemorrhage at this location on prior CT. No other diffusion abnormality to suggest acute or subacute ischemia. Gray-white matter differentiation otherwise grossly maintained. No visible areas of chronic cortical infarction. No definite evidence for intracranial hemorrhage on this technically limited exam. No mass lesion, mass effect or midline shift. Diffuse ventricular prominence related to global parenchymal volume loss of hydrocephalus. No extra-axial fluid collection. Pituitary gland suprasellar region normal. Vascular: Major intracranial vascular flow voids are grossly maintained at the skull base. Skull and upper cervical spine: Craniocervical junction within normal limits. Bone marrow signal intensity normal. No scalp soft tissue abnormality. Sinuses/Orbits: Prior bilateral ocular lens replacement. Globes and orbital soft tissues demonstrate no acute finding. Scattered mucosal thickening noted within the ethmoidal air cells and maxillary sinuses. Paranasal sinuses are otherwise clear. Small left with moderate right mastoid effusions. Visualized nasopharynx unremarkable. Other: None. IMPRESSION: 1. Technically limited exam due to motion artifact and the patient's inability to tolerate the full length of the study. 2. Punctate 4 mm focus of diffusion abnormality involving the right periatrial white matter, likely a small acute small vessel type infarct. No associated mass effect. 3. No other acute intracranial abnormality. 4. Age-related cerebral atrophy with moderate chronic microvascular ischemic disease. Electronically Signed   By: Jeannine Boga M.D.   On: 09/09/2021 23:44   DG Chest Portable 1 View  Result Date: 09/09/2021 CLINICAL DATA:  Weakness. EXAM: PORTABLE CHEST 1 VIEW COMPARISON:  Chest x-ray 03/30/2016 there is stable mild FINDINGS: Elevation of the right hemidiaphragm. The heart size and mediastinal contours are within normal limits. Both lungs are clear. The visualized  skeletal structures are unremarkable. IMPRESSION: No active disease. Electronically Signed   By: Ronney Asters M.D.   On: 09/09/2021 17:22   EEG adult  Result Date: 09/10/2021 Lora Havens, MD     09/10/2021 12:04 PM Patient Name: [name] [name] MRN: [id number] Epilepsy Attending: Lora Havens Referring Physician/Provider: Gwinda Maine, MD Date: 09/10/2021 Duration: 39.42 mins Patient history: 88 y.o. female PMHx as noted above on home aspirin 81 mg daily presented to the ED encephalopathic found to have small punctate focus of increased diffusion signal in the right parietal white matter. EEG to evaluate for seizure Level of alertness: Awake, asleep AEDs during EEG study: None Technical aspects: This EEG study was done with scalp electrodes positioned according to the 10-20 International system of electrode placement. Electrical activity was acquired at a sampling rate of 500Hz  and reviewed with a high frequency filter of 70Hz  and a low frequency filter of 1Hz . EEG data were recorded continuously and digitally stored. Description: The posterior dominant rhythm consists of 8 Hz activity of moderate voltage (25-35 uV) seen predominantly in posterior head regions, symmetric and reactive to eye opening and eye closing. Sleep was characterized by vertex waves, sleep spindles (12 to 14 Hz), maximal frontocentral region. Hyperventilation and photic stimulation were not performed.   IMPRESSION: This study is within normal limits. No seizures or epileptiform discharges were seen throughout the recording. [place]  EXAM  Physical Exam  Constitutional: Appears well-developed and well-nourished.  Cardiovascular: Normal rate and regular rhythm.  Respiratory: Effort normal, non-labored breathing  Neuro: Mental Status: Patient is awake, alert, oriented to person and place, not time or age.  Patient is able to give a clear and coherent history. No signs of aphasia or  neglect Inconsistently follows commands. She is able to identify hand, finger, thumb, watch, but is unable to identify objects on her tray table such as the cup of juice. Cranial Nerves: II: Visual Fields are full. Pupils are equal, round, and reactive to light.   III,IV, VI: EOMI without ptosis or diploplia.  V: Facial sensation is symmetric to temperature VII: Facial movement is symmetric resting and smiling VIII: Hearing is intact to voice X: Palate elevates symmetrically XI: Shoulder shrug is symmetric. XII: Tongue protrudes midline without atrophy or fasciculations.  Motor: Tone is normal. Bulk is normal.  Does not follow instructions well for a strength exam, moves all extremities antigravity. Upper extremities 5/5 with strong grip strength.  Bilateral lower extremities drift to the bed.  Sensory: Sensation is symmetric to light touch and temperature in the arms and legs. No extinction to DSS present.    ASSESSMENT/PLAN Ms. MARILYN NIHISER is a 88 y.o. female with history of vascular dementia, degenerative joint disease, urinary incontinence, DM2, fatigue, HLD, HTN, osteoarthritis, Vit B12 Deficiency presenting with altered mental status after a fall on Tuesday 3/7. Ammonia 13. Creatinine 1.3, GFR 39.  2D echo and carotid Dopplers pending  Stroke:  right periatrial WM punctate infarct likely secondary small vessel disease source Code Stroke CT head No acute abnormality. Small vessel disease. Atrophy. ASPECTS 10.    MRI  39mm focus of diffusion abnormality involving the right periatrial white matter, small acute small vessel type infarct.  OK without intracranial imaging given not change management Carotid Doppler unremarkable 2D Echo EF 55-60% EEG-study within normal limits, no seizures or epileptiform discharges seen LDL 65 HgbA1c 9.5 VTE prophylaxis - SCDs aspirin 81 mg daily prior to admission, now on aspirin 81 mg daily and clopidogrel 75 mg daily 3 weeks and then back to  ASA 81mg  to minimize bleeding in elderly  Therapy recommendations:  pending Disposition:  pending  Hypertension Home meds:  None Stable gradually normalize in 2-3 days Long-term BP goal normotensive  Hyperlipidemia Home meds:  Simvastatin 40mg , resumed in hospital LDL 97, goal < 70 Continue statin at discharge given advance age  Diabetes type II Uncontrolled Home meds:  Insulin  HgbA1c 9.2, goal < 7.0 CBGs SSI Close PCP follow-up for better DM control  Other Stroke Risk Factors Advanced Age >/= 21  Obesity, BMI >/= 30 associated with increased stroke risk, recommend weight loss, diet and exercise as appropriate   Other Active Problems Vascular dementia Frequent reorientation, sleep hygiene, avoid delirium Follows with Dr. Delice Lesch at [facility] day # 0  Patient seen and examined by NP/APP with MD. MD to update note as needed.   Janine Ores, DNP, FNP-BC [facility] Pager: [phone]   ATTENDING NOTE: I reviewed above note and agree with the assessment and plan. Pt was seen and examined.   88 year old female with history of vascular dementia, hypertension, hyperlipidemia, diabetes, bowel bladder incontinence at baseline and stroke admitted for altered mental status, confusion, falling at home.  CT no acute abnormality.  MRI showed right parietal periventricular white matter punctate infarct.  EF 55 to 60%, carotid Doppler negative.  LDL 97, A1c 9.2.  UA negative, UDS negative, chest x-ray negative, creatinine 1.5.  WBC 7.8, ammonia level 13.  EEG no seizure.  On exam, no family at bedside, patient lying in bed, awake alert, pleasant, not orientated to time or age or situation, however orientated to place.  No aphasia, paucity of speech, following all simple commands.  Able to name and repeat.  No focal neurologic deficit seen.  Etiology for patient punctate white matter infarct likely due to small vessel disease given location and uncontrolled risk  factors.  Recommend further risk factor modification.  Continue aspirin 81 and Plavix 75 DAPT for 3 weeks and then back to aspirin alone.  Continue Zocor 16 given advanced age.  PT therapy recommend continue PACE program.  Patient will continue follow-up with Dr. Delice Lesch at [facility].   For detailed assessment and plan, please refer to above as I have made changes wherever appropriate.   Neurology will sign off. Please call with questions. Pt will follow up with Dr. Delice Lesch at [facility] in about 4 weeks. Thanks for the consult.   Rosalin Hawking, MD PhD Stroke Neurology 09/10/2021 5:41 PM    To contact Stroke Continuity provider, please refer to [email or web address] After hours, contact General Neurology

## 2021-09-10 NOTE — Progress Notes (Signed)
HD#0 Subjective:  Overnight Events: NAEO   She is feeling fair.  States "we are in a world" when asked location. Does not remember what brought her in to the hospital or what happened prior to.  She is surprised to find out that she had a stroke.  Denies any belly pain.  States she lives alone.   Objective:  Vital signs in last 24 hours: Vitals:   09/09/21 2342 09/10/21 0321 09/10/21 0759 09/10/21 1200  BP: (!) 146/76 137/75 (!) 126/49   Pulse: 91 92 74   Resp: 19 16 18    Temp: 98.6 F (37 C) 98.2 F (36.8 C) 98.8 F (37.1 C)   TempSrc: Oral Oral Oral   SpO2: 99% 96% 97% 98%   Supplemental O2: Room Air SpO2: 98 %   Physical Exam:  Constitutional: elderly woman resting comfortably in bed, in no acute distress HEENT: normocephalic atraumatic, mucous membranes moist, conjunctiva non-erythematous Cardiovascular: regular rate and rhythm, no m/r/g Pulmonary/Chest: normal work of breathing on room air, lungs clear to auscultation bilaterally  Abdominal: soft, non-tender, non-distended MSK: moving all four extremities Neurological: alert, oriented to self, answering simple questions but not able to answer complicated questions, following some commands, word salad and perseveration Psych: normal affect  There were no vitals filed for this visit.   Intake/Output Summary (Last 24 hours) at 09/10/2021 1519 Last data filed at 09/10/2021 1436 Gross per 24 hour  Intake 980 ml  Output 500 ml  Net 480 ml   Net IO Since Admission: 480 mL [09/10/21 1519]  Pertinent Labs: CBC Latest Ref Rng & Units 09/10/2021 09/09/2021 09/09/2021  WBC 4.0 - 10.5 K/uL 7.8 - 8.5  Hemoglobin 12.0 - 15.0 g/dL 11.7(L) 12.9 13.3  Hematocrit 36.0 - 46.0 % 34.4(L) 38.0 40.0  Platelets 150 - 400 K/uL 201 - 237    CMP Latest Ref Rng & Units 09/10/2021 09/09/2021 09/09/2021  Glucose 70 - 99 mg/dL 134(H) 118(H) 118(H)  BUN 8 - 23 mg/dL 17 32(H) 23  Creatinine 0.44 - 1.00 mg/dL 1.30(H) 1.50(H) 1.48(H)   Sodium 135 - 145 mmol/L 136 137 137  Potassium 3.5 - 5.1 mmol/L 4.1 4.6 4.6  Chloride 98 - 111 mmol/L 103 103 101  CO2 22 - 32 mmol/L 26 - 25  Calcium 8.9 - 10.3 mg/dL 8.4(L) - 9.4  Total Protein 6.5 - 8.1 g/dL 6.3(L) - 6.9  Total Bilirubin 0.3 - 1.2 mg/dL 0.5 - 1.2  Alkaline Phos 38 - 126 U/L 34(L) - 39  AST 15 - 41 U/L 35 - 60(H)  ALT 0 - 44 U/L 22 - 28    Imaging: CT HEAD WO CONTRAST  Result Date: 09/09/2021 CLINICAL DATA:  History of a recent fall.  Mental status change. EXAM: CT HEAD WITHOUT CONTRAST TECHNIQUE: Contiguous axial images were obtained from the base of the skull through the vertex without intravenous contrast. RADIATION DOSE REDUCTION: This exam was performed according to the departmental dose-optimization program which includes automated exposure control, adjustment of the mA and/or kV according to patient size and/or use of iterative reconstruction technique. COMPARISON:  MRI brain 09/04/2020 FINDINGS: Brain: Stable age related cerebral atrophy, ventriculomegaly and periventricular white matter disease. No extra-axial fluid collections are identified. No CT findings for acute hemispheric infarction or intracranial hemorrhage. No mass lesions. The brainstem and cerebellum are normal. Vascular: Vascular calcifications but no aneurysm hyperdense vessels. Skull: No skull fracture or bone lesions. Sinuses/Orbits: The paranasal sinuses and mastoid air cells are clear. The globes  are intact. Other: No scalp lesions or scalp hematoma. IMPRESSION: 1. Stable age related cerebral atrophy, ventriculomegaly and periventricular white matter disease. 2. No acute intracranial findings or skull fracture. Electronically Signed   By: Marijo Sanes M.D.   On: 09/09/2021 17:49   MR BRAIN WO CONTRAST  Result Date: 09/09/2021 CLINICAL DATA:  Initial evaluation for neuro deficit, stroke suspected. Altered mental status. EXAM: MRI HEAD WITHOUT CONTRAST TECHNIQUE: Multiplanar, multiecho pulse sequences  of the brain and surrounding structures were obtained without intravenous contrast. COMPARISON:  Prior CT from earlier the same day. FINDINGS: Brain: Examination technically limited as the patient was unable to tolerate the full length of the study. Diffusion-weighted sequences, T2 and FLAIR sequences, and sagittal T1 weighted sequence only were performed. Additionally, provided images are moderately to severely degraded by motion artifact. Generalized age-related cerebral atrophy with moderate chronic microvascular ischemic disease. Punctate 4 mm focus of diffusion abnormality seen involving the right periatrial white matter, likely a small acute small vessel type infarct (series 7, image 85). No associated mass effect. No visible hemorrhage at this location on prior CT. No other diffusion abnormality to suggest acute or subacute ischemia. Gray-white matter differentiation otherwise grossly maintained. No visible areas of chronic cortical infarction. No definite evidence for intracranial hemorrhage on this technically limited exam. No mass lesion, mass effect or midline shift. Diffuse ventricular prominence related to global parenchymal volume loss of hydrocephalus. No extra-axial fluid collection. Pituitary gland suprasellar region normal. Vascular: Major intracranial vascular flow voids are grossly maintained at the skull base. Skull and upper cervical spine: Craniocervical junction within normal limits. Bone marrow signal intensity normal. No scalp soft tissue abnormality. Sinuses/Orbits: Prior bilateral ocular lens replacement. Globes and orbital soft tissues demonstrate no acute finding. Scattered mucosal thickening noted within the ethmoidal air cells and maxillary sinuses. Paranasal sinuses are otherwise clear. Small left with moderate right mastoid effusions. Visualized nasopharynx unremarkable. Other: None. IMPRESSION: 1. Technically limited exam due to motion artifact and the patient's inability to  tolerate the full length of the study. 2. Punctate 4 mm focus of diffusion abnormality involving the right periatrial white matter, likely a small acute small vessel type infarct. No associated mass effect. 3. No other acute intracranial abnormality. 4. Age-related cerebral atrophy with moderate chronic microvascular ischemic disease. Electronically Signed   By: Jeannine Boga M.D.   On: 09/09/2021 23:44   DG Chest Portable 1 View  Result Date: 09/09/2021 CLINICAL DATA:  Weakness. EXAM: PORTABLE CHEST 1 VIEW COMPARISON:  Chest x-ray 03/30/2016 there is stable mild FINDINGS: Elevation of the right hemidiaphragm. The heart size and mediastinal contours are within normal limits. Both lungs are clear. The visualized skeletal structures are unremarkable. IMPRESSION: No active disease. Electronically Signed   By: Ronney Asters M.D.   On: 09/09/2021 17:22   EEG adult  Result Date: 09/10/2021 Lora Havens, MD     09/10/2021 12:04 PM Patient Name: RIKIA SUKHU MRN: [id number] Epilepsy Attending: Lora Havens Referring Physician/Provider: Gwinda Maine, MD Date: 09/10/2021 Duration: 39.42 mins Patient history: 88 y.o. female PMHx as noted above on home aspirin 81 mg daily presented to the ED encephalopathic found to have small punctate focus of increased diffusion signal in the right parietal white matter. EEG to evaluate for seizure Level of alertness: Awake, asleep AEDs during EEG study: None Technical aspects: This EEG study was done with scalp electrodes positioned according to the 10-20 International system of electrode placement. Electrical activity was acquired at a sampling  rate of 500Hz  and reviewed with a high frequency filter of 70Hz  and a low frequency filter of 1Hz . EEG data were recorded continuously and digitally stored. Description: The posterior dominant rhythm consists of 8 Hz activity of moderate voltage (25-35 uV) seen predominantly in posterior head regions, symmetric and  reactive to eye opening and eye closing. Sleep was characterized by vertex waves, sleep spindles (12 to 14 Hz), maximal frontocentral region. Hyperventilation and photic stimulation were not performed.   IMPRESSION: This study is within normal limits. No seizures or epileptiform discharges were seen throughout the recording. Lora Havens   ECHOCARDIOGRAM COMPLETE  Result Date: 09/10/2021    ECHOCARDIOGRAM REPORT   Patient Name:   CIDNEY KIRKWOOD Date of Exam: 09/10/2021 Medical Rec #:  [id number]          Height:       63.0 in Accession #:    [id number]         Weight:       159.7 lb Date of Birth:  1928-12-16         BSA:          1.757 m Patient Age:    23 years           BP:           137/75 mmHg Patient Gender: F                  HR:           65 bpm. Exam Location:  Inpatient Procedure: 2D Echo, Cardiac Doppler and Color Doppler Indications:    Stroke  History:        Patient has no prior history of Echocardiogram examinations.                 Risk Factors:Hypertension and Diabetes.  Sonographer:    Jyl Heinz Referring Phys: [place]  1. Left ventricular ejection fraction, by estimation, is 55 to 60%. The left ventricle has normal function. The left ventricle has no regional wall motion abnormalities. There is mild asymmetric left ventricular hypertrophy of the basal-septal segment. Left ventricular diastolic parameters are consistent with Grade I diastolic dysfunction (impaired relaxation).  2. Right ventricular systolic function is normal. The right ventricular size is normal. There is normal pulmonary artery systolic pressure.  3. The mitral valve is grossly normal. No evidence of mitral valve regurgitation. No evidence of mitral stenosis.  4. The aortic valve is tricuspid. Aortic valve regurgitation is mild. Aortic valve sclerosis is present, with no evidence of aortic valve stenosis.  5. Aortic dilatation noted. There is mild dilatation of the ascending aorta,  measuring 42 mm.  6. The inferior vena cava is normal in size with greater than 50% respiratory variability, suggesting right atrial pressure of 3 mmHg. Comparison(s): No prior Echocardiogram. FINDINGS  Left Ventricle: Left ventricular ejection fraction, by estimation, is 55 to 60%. The left ventricle has normal function. The left ventricle has no regional wall motion abnormalities. The left ventricular internal cavity size was normal in size. There is  mild asymmetric left ventricular hypertrophy of the basal-septal segment. Left ventricular diastolic parameters are consistent with Grade I diastolic dysfunction (impaired relaxation). Right Ventricle: The right ventricular size is normal. No increase in right ventricular wall thickness. Right ventricular systolic function is normal. There is normal pulmonary artery systolic pressure. The tricuspid regurgitant velocity is 2.62 m/s, and  with an assumed right atrial pressure of 3 mmHg, the  estimated right ventricular systolic pressure is 21.3 mmHg. Left Atrium: Left atrial size was normal in size. Right Atrium: Right atrial size was normal in size. Pericardium: There is no evidence of pericardial effusion. Mitral Valve: The mitral valve is grossly normal. Mild to moderate mitral annular calcification. No evidence of mitral valve regurgitation. No evidence of mitral valve stenosis. Tricuspid Valve: The tricuspid valve is normal in structure. Tricuspid valve regurgitation is mild. Aortic Valve: The aortic valve is tricuspid. Aortic valve regurgitation is mild. Aortic regurgitation PHT measures 643 msec. Aortic valve sclerosis is present, with no evidence of aortic valve stenosis. Aortic valve mean gradient measures 9.0 mmHg. Aortic valve peak gradient measures 15.1 mmHg. Aortic valve area, by VTI measures 1.42 cm. Pulmonic Valve: The pulmonic valve was normal in structure. Pulmonic valve regurgitation is not visualized. No evidence of pulmonic stenosis. Aorta: Aortic  dilatation noted. There is mild dilatation of the ascending aorta, measuring 42 mm. Venous: The inferior vena cava is normal in size with greater than 50% respiratory variability, suggesting right atrial pressure of 3 mmHg. IAS/Shunts: No atrial level shunt detected by color flow Doppler.  LEFT VENTRICLE PLAX 2D LVIDd:         3.90 cm     Diastology LVIDs:         2.30 cm     LV e' medial:    6.74 cm/s LV PW:         1.20 cm     LV E/e' medial:  13.1 LV IVS:        1.20 cm     LV e' lateral:   7.18 cm/s LVOT diam:     2.00 cm     LV E/e' lateral: 12.3 LV SV:         67 LV SV Index:   38 LVOT Area:     3.14 cm  LV Volumes (MOD) LV vol d, MOD A2C: 70.3 ml LV vol d, MOD A4C: 71.8 ml LV vol s, MOD A2C: 30.4 ml LV vol s, MOD A4C: 30.4 ml LV SV MOD A2C:     39.9 ml LV SV MOD A4C:     71.8 ml LV SV MOD BP:      40.8 ml RIGHT VENTRICLE            IVC RV Basal diam:  2.60 cm    IVC diam: 2.00 cm RV Mid diam:    1.30 cm RV S prime:     9.25 cm/s TAPSE (M-mode): 1.7 cm LEFT ATRIUM             Index        RIGHT ATRIUM          Index LA diam:        3.10 cm 1.76 cm/m   RA Area:     9.52 cm LA Vol (A2C):   41.1 ml 23.39 ml/m  RA Volume:   17.90 ml 10.19 ml/m LA Vol (A4C):   28.7 ml 16.33 ml/m LA Biplane Vol: 34.8 ml 19.80 ml/m  AORTIC VALVE AV Area (Vmax):    1.70 cm AV Area (Vmean):   1.66 cm AV Area (VTI):     1.42 cm AV Vmax:           194.00 cm/s AV Vmean:          137.667 cm/s AV VTI:            0.475 m AV Peak Grad:  15.1 mmHg AV Mean Grad:      9.0 mmHg LVOT Vmax:         105.00 cm/s LVOT Vmean:        72.750 cm/s LVOT VTI:          0.214 m LVOT/AV VTI ratio: 0.45 AI PHT:            643 msec  AORTA Ao Root diam: 3.20 cm Ao Asc diam:  4.00 cm MITRAL VALVE                TRICUSPID VALVE MV Area (PHT): 2.82 cm     TR Peak grad:   27.5 mmHg MV Decel Time: 269 msec     TR Vmax:        262.00 cm/s MV E velocity: 88.60 cm/s MV A velocity: 108.00 cm/s  SHUNTS MV E/A ratio:  0.82         Systemic VTI:  0.21 m                              Systemic Diam: 2.00 cm Rudean Haskell MD Electronically signed by Rudean Haskell MD Signature Date/Time: 09/10/2021/2:20:11 PM    Final     Assessment/Plan:   Principal Problem:   AMS (altered mental status) Active Problems:   Cerebral thrombosis with cerebral infarction   Patient Summary: JENETTE RAYSON is a 88 y.o.with PMHx significant for vascular dementia, prior small infarcts bilateral cerebral white matter, HTN, HLD, T2DM, chronic urinary incontinence, chronic diarrhea, depression, who presented for episode of acute altered mental status.   #Acute small 12mm punctate infarct of the pariatrial white matter #Hx Vascular Dementia #Prior small CVAs (2022) Patient follows with [facility], after speaking with the son they really want to avoid sending her to a facility if possible. Given her age and life expectancy an extensive stroke workup will not be of great value. Clarified medication dosing with family, she has been taking both effexor and trazodone but they don't feel like she needs both and she was supposed to stop at least the trazodone. Given recent stroke continuing on effexor may have some benefit but agree with stopping trazodone. Will have PT/OT work with her. SLP has given her the ok to have regular diet.  -TSH WNL -Delirium precautions -regular diet -Pain management with scheduled Tylenol -PT/OT eval and treat -plavix - LR bolus  #Type 2 diabetes mellitus Last hemoglobin A1c 9.5% back in December 2022.  Patient currently on NPH insulin 28 units daily, NovoLog 8 to 10 units mealtime, has not been on Trulicity for the last month due to change in insurance.  CBG on arrival normal. Likely does not need to go out on insulin at home given greater risk of hypoglycemia. Will adjust medications regimen.  Plan: -Semglee 12 units nightly -Moderate SSI -CBGs mealtime and nightly   #HLD Continued on simvastatin 40 mg daily   #Frequent falls On baby  aspirin and statin at home.  Seems patient has had frequent falls in the last year, 2 abrasions 1 from Tuesday and 1 from last week concerning for patient frequently falling at home.  No generalized weakness or balance difficulties noted by family.  -PT OT eval as above   #HTN Not on antihypertensive currently. Normotensive thus far.   Diet: regular diet VTE: SCDs IVF:  None Code: DNR  Scarlett Presto, MD Internal Medicine Resident PGY-1 Pager [phone] Please contact the on call pager  after 5 pm and on weekends at [phone].

## 2021-09-10 NOTE — Consult Note (Signed)
Neurology Consult H&P  [name] [name] MR# [id number] 09/10/2021  CC: stroke   History is obtained from: Floor staff and chart.  HPI: [name] [name] is a 88 y.o. female PMHx as reviewed below with acute onset encephalopathy following to have acute embolic stroke.  The following information was taken from ED note 09/09/2021 1650: "Altered mental status.  Patient resides at home by herself.  Family members check on her.  According to the EMS report the patient fell sometime on Tuesday.  Last night the family noticed the patient was starting to become somewhat more agitated and confused.  Today patient apparently wandered off from the house for about 2 hours until she was found.  Patient has never done this before in the past.  Patient went to the primary care doctor's office and was sent there to the ED.  Notes from the primary care doctor's office reviewed.  Includes information about past medical history and demographics but no specific information about today.  Patient herself is not able to answer any questions about what specifically happened recently.  She denies any specific complaints right now but is having some difficulty communicating."   LKW: Unclear tNK given: No OS W IR Thrombectomy No, not indicated Modified Rankin Scale: 0-Completely asymptomatic and back to baseline post- stroke NIHSS: 21 -largely due to encephalopathy. LOC Responsiveness 2 LOC Questions 2 LOC Commands 2 Horizontal eye movement 0 Visual field 0 Facial palsy 0 Motor arm - Right arm 3 Motor arm - Left arm 3 Motor leg - Right leg 3 Motor leg - Left leg 3 Limb ataxia 0 Sensory test 0 Language 3 Speech 0 Extinction and inattention 0  ROS: Unable to assess due to encephalopathy  Past Medical History:  Diagnosis Date   Asthma    DEGENERATIVE JOINT DISEASE, GENERALIZED 02/01/2007   Qualifier: Diagnosis of  By: Burnice Logan  MD, Doretha Sou    DIARRHEA, CHRONIC 08/20/2009   Qualifier: Diagnosis of  By:  Burnice Logan  MD, Doretha Sou    DM 02/01/2007   Qualifier: Diagnosis of  By: Floyde Parkins     FATIGUE 10/20/2008   Qualifier: Diagnosis of  By: Burnice Logan  MD, Doretha Sou    HYPERCHOLESTEROLEMIA 02/01/2007   Qualifier: Diagnosis of  By: Floyde Parkins     HYPERLIPIDEMIA 12/20/2007   Qualifier: Diagnosis of  By: Burnice Logan  MD, Doretha Sou    HYPERTENSION 02/01/2007   Qualifier: Diagnosis of  By: Russella Dar BACK PAIN 01/13/2010   Qualifier: Diagnosis of  By: Burnice Logan  MD, Doretha Sou    MUSCLE CRAMPS 04/06/2010   Qualifier: Diagnosis of  By: Burnice Logan  MD, Doretha Sou    MYALGIA 01/20/2010   Qualifier: Diagnosis of  By: Elease Hashimoto MD, Bruce     OSTEOARTHRITIS 01/13/2010   Qualifier: Diagnosis of  By: Burnice Logan  MD, Doretha Sou    OVERACTIVE BLADDER 08/20/2009   Qualifier: Diagnosis of  By: Burnice Logan  MD, Doretha Sou    PLEURISY 06/18/2008   Qualifier: Diagnosis of  By: Niel Hummer MD, Lorinda Creed    PNEUMONIA 06/18/2008   Qualifier: Diagnosis of  By: Niel Hummer MD, Willie R    Rectal prolapse 09/20/2007   Qualifier: Diagnosis of  By: Burnice Logan  MD, Doretha Sou    VITAMIN B12 DEFICIENCY 07/20/2010   Qualifier: Diagnosis of  By: Carlean Purl MD, Dimas Millin      Family History  Problem Relation Age of Onset   Deep vein thrombosis  Mother    COPD Father    Asthma Father    Prostate cancer Brother    Heart attack Brother    Heart attack Maternal Grandfather    Diabetes Neg Hx    Heart disease Neg Hx    Stroke Neg Hx     Social History:  reports that she has quit smoking. Her smoking use included cigarettes. She smoked an average of 10 packs per day. She has never used smokeless tobacco. She reports that she does not drink alcohol and does not use drugs.   Prior to Admission medications   Medication Sig Start Date End Date Taking? Authorizing Provider  Acetaminophen 650 MG TABS Take 1 tablet (650 mg total) by mouth 3 (three) times daily as needed. 12/19/12  Yes Moreno-Coll, Adlih, MD  aspirin 81 MG tablet  Take 81 mg by mouth daily.   Yes [provider]  Blood Glucose Calibration (ACCU-CHEK GUIDE CONTROL) LIQD Use as directed 05/07/20  Yes Nafziger, Tommi Rumps, NP  Blood Glucose Monitoring Suppl (ACCU-CHEK AVIVA PLUS) w/Device KIT Use to check blood sugar 3 times a day 08/15/19  Yes Philemon Kingdom, MD  Blood Glucose Monitoring Suppl (ACCU-CHEK GUIDE) w/Device KIT Use as directed 05/07/20  Yes Nafziger, Tommi Rumps, NP  calcium-vitamin D (OSCAL WITH D 500-200) 500-200 MG-UNIT per tablet Take 1 tablet by mouth daily.   Yes [provider]  Continuous Blood Gluc Sensor (FREESTYLE LIBRE 14 DAY SENSOR) MISC Use to check blood glucose 3 times a day 09/30/20  Yes Philemon Kingdom, MD  Dulaglutide (TRULICITY) 3 AS/3.4HD SOPN Inject 3 mg into the skin once a week. 06/11/21  Yes Philemon Kingdom, MD  fluticasone (FLONASE) 50 MCG/ACT nasal spray Place 1 spray into the nose daily. 12/19/12  Yes Moreno-Coll, Adlih, MD  Glucagon 3 MG/DOSE POWD Place 3 mg into the nose once as needed for up to 1 dose. 09/11/20  Yes Philemon Kingdom, MD  glucose blood (ACCU-CHEK AVIVA PLUS) test strip Use to check blood sugar 3 times a day. 08/15/19  Yes Philemon Kingdom, MD  glucose blood (ACCU-CHEK GUIDE) test strip CHECK BLOOD SUGAR THREE TIMES DAILY 09/02/20  Yes Philemon Kingdom, MD  insulin NPH Human (NOVOLIN N) 100 UNIT/ML injection Inject 0.28 mLs (28 Units total) into the skin daily before breakfast. Pens please 06/11/21  Yes Philemon Kingdom, MD  Insulin Pen Needle (BD PEN NEEDLE NANO U/F) 32G X 4 MM MISC USE TO INJECT INSULIN 4 TIMES DAILY AS DIRECTED. 08/15/19  Yes Philemon Kingdom, MD  Lancets [facility] ULTRASOFT) lancets Use as instructed to test 3 times daily 08/15/19  Yes Philemon Kingdom, MD  Multiple Vitamin (MULTIVITAMIN) tablet Take 1 tablet by mouth daily.   Yes [provider]  simvastatin (ZOCOR) 40 MG tablet TAKE 1 TABLET EVERY DAY 03/10/21  Yes Nafziger, Tommi Rumps, NP  trazodone (DESYREL) 300 MG tablet  TAKE 1 TABLET AT BEDTIME 04/06/21  Yes Nafziger, Tommi Rumps, NP  venlafaxine XR (EFFEXOR-XR) 75 MG 24 hr capsule TAKE 1 CAPSULE EVERY DAY 04/06/21  Yes Nafziger, Tommi Rumps, NP  NOVOLOG FLEXPEN 100 UNIT/ML FlexPen Inject under skin 10-12 units 3x a day before meals 06/11/21   Philemon Kingdom, MD    Exam: Current vital signs: BP (!) 146/76 (BP Location: Right Arm)    Pulse 91    Temp 98.6 F (37 C) (Oral)    Resp 19    SpO2 99%   Physical Exam  Constitutional: Appears well-developed and well-nourished.  Psych: Unable to assess due to encephalopathy  eyes: No scleral injection HENT: No OP obstruction. Head: Normocephalic.  Cardiovascular: Normal rate and regular rhythm.  Respiratory: Effort normal, symmetric excursions bilaterally, no audible wheezing. GI: Soft.  No distension. There is no tenderness.  Skin: WDI  Neuro: Mental Status: Obtunded Nonverbal Visual fields are full to confrontation. Pupils are equal, round, and reactive to light. EOMI with head turn without ptosis or diplopia.  Facial sensation is symmetric to temperature Facial grimace is symmetric.  Tone is normal. Bulk is normal.  Gently squeezes and lets go and left hand. Withdraws to noxious stimulus Deep Tendon Reflexes: Unable to elicit Babinski (+) bilaterally good withdrawal good strength. Gait - Deferred  I have reviewed labs in epic and the pertinent results are: CBG 91  I have reviewed the images obtained: MRI brain showed punctate 4 mm focus of diffusion abnormality involving the right periatrial white matter, likely a small acute small vessel type infarct.  Assessment: [name] [name] is a 88 y.o. female PMHx as noted above on home aspirin 81 mg daily presented to the ED encephalopathic found to have small punctate focus of increased diffusion signal in the right parietal white matter.  She will also need EEG to further evaluate encephalopathy.  Impression:  Acute embolic stroke right parietal white  matter. Acute metabolic encephalopathy  Plan: - Recommend vascular imaging with MRA head and neck. - Recommend TTE. - Recommend labs: HbA1c, lipid panel - Pending. - Recommend Statin for goal LDL <70. - Goal A1c <7. - Continue aspirin 49m daily. - Clopidogrel 790mdaily for 3 weeks. - SBP goal <160. - Telemetry monitoring for arrhythmia. - Recommend bedside Swallow screen. - Recommend Stroke education. - Recommend PT/OT/SLP consult. -Continue metabolic/infectious work-up - Routine EEG - Ordered.   Electronically signed by:  HuLynnae SandhoffMD Page: [phone]/04/2022, 1:37 AM  If 7pm- 7am, please page neurology on call as listed in [facility]

## 2021-09-10 NOTE — Procedures (Signed)
Patient Name: [name] [name]  ?MRN: [id number]  ?Epilepsy Attending: Lora Havens  ?Referring Physician/Provider: Gwinda Maine, MD ?Date: 09/10/2021 ?Duration: 39.42 mins ? ?Patient history: 88 y.o. female PMHx as noted above on home aspirin 81 mg daily presented to the ED encephalopathic found to have small punctate focus of increased diffusion signal in the right parietal white matter. EEG to evaluate for seizure ? ?Level of alertness: Awake, asleep ? ?AEDs during EEG study: None ? ?Technical aspects: This EEG study was done with scalp electrodes positioned according to the 10-20 International system of electrode placement. Electrical activity was acquired at a sampling rate of 500Hz  and reviewed with a high frequency filter of 70Hz  and a low frequency filter of 1Hz . EEG data were recorded continuously and digitally stored.  ? ?Description: The posterior dominant rhythm consists of 8 Hz activity of moderate voltage (25-35 uV) seen predominantly in posterior head regions, symmetric and reactive to eye opening and eye closing. Sleep was characterized by vertex waves, sleep spindles (12 to 14 Hz), maximal frontocentral region. Hyperventilation and photic stimulation were not performed.    ? ?IMPRESSION: ?This study is within normal limits. No seizures or epileptiform discharges were seen throughout the recording. ? ?Lora Havens  ? ?

## 2021-09-10 NOTE — TOC Initial Note (Signed)
Transition of Care (TOC) - Initial/Assessment Note  ? ? ?Patient Details  ?Name: [name] [name] ?MRN: [id number] ?Date of Birth: [birth year] ? ?Transition of Care (TOC) CM/SW Contact:    ?Verdell Carmine, RN ?Phone Number: ?09/10/2021, 12:06 PM ? ?Clinical Narrative:                 ? ?Patient presented with confusion. She lives at home by herself and is checked on by family every day.Apparently the patient left home and wandered around for 2 hours and was found by family. This is the first elopement. Family took the patient to her PCP, who sent her to the ED for evaluation. The patient is confused and not able to answer questions.  ? PT and OT assessments are pending. Marland Kitchen TOC will follow the patient for needs, recommendations, and transitions.  ?  ?Barriers to Discharge: Continued Medical Work up ? ? ?Patient Goals and CMS Choice ?  ?  ?  ? ?Expected Discharge Plan and Services ?  ?In-house Referral: Clinical Social Work ?Discharge Planning Services: CM Consult ?  ?Living arrangements for the past 2 months: [place] ?                ?  ?  ?  ?  ?  ?  ?  ?  ?  ?  ? ?Prior Living Arrangements/Services ?Living arrangements for the past 2 months: [place] ?Lives with:: Self (family checks on her every day) ?Patient language and need for interpreter reviewed:: Yes ?       ?Need for Family Participation in Patient Care: Yes (Comment) ?Care giver support system in place?: Yes (comment) ?  ?Criminal Activity/Legal Involvement Pertinent to Current Situation/Hospitalization: No - Comment as needed ? ?Activities of Daily Living ?Home Assistive Devices/Equipment: None ?ADL Screening (condition at time of admission) ?Patient's cognitive ability adequate to safely complete daily activities?: Yes ?Is the patient deaf or have difficulty hearing?: Yes ?Does the patient have difficulty seeing, even when wearing glasses/contacts?: Yes ?Does the patient have difficulty concentrating, remembering, or making  decisions?: Yes ?Patient able to express need for assistance with ADLs?: Yes ?Does the patient have difficulty dressing or bathing?: No ?Independently performs ADLs?: Yes (appropriate for developmental age) ?Does the patient have difficulty walking or climbing stairs?: No ?Weakness of Legs: Both ?Weakness of Arms/Hands: Both ? ?Permission Sought/Granted ?  ?  ?   ?   ?   ?   ? ?Emotional Assessment ?  ?  ?  ?Orientation: : Fluctuating Orientation (Suspected and/or reported Sundowners) ?Alcohol / Substance Use: Not Applicable ?Psych Involvement: No (comment) ? ?Admission diagnosis:  Altered mental status, unspecified altered mental status type [R41.82] ?AMS (altered mental status) [R41.82] ?Dementia, unspecified dementia severity, unspecified dementia type, unspecified whether behavioral, psychotic, or mood disturbance or anxiety ([place]) [F03.90] ?Patient Active Problem List  ? Diagnosis Date Noted  ? Cerebral thrombosis with cerebral infarction 09/10/2021  ? AMS (altered mental status) 09/09/2021  ? Overweight (BMI 25.0-29.9) 03/15/2018  ? Type 2 diabetes mellitus with diabetic nephropathy, with long-term current use of insulin ([employer]) 06/17/2014  ? VITAMIN B12 DEFICIENCY 07/20/2010  ? LOW BACK PAIN 01/13/2010  ? OVERACTIVE BLADDER 08/20/2009  ? DIARRHEA, CHRONIC 08/20/2009  ? Fatigue 10/20/2008  ? RECTAL PROLAPSE 09/20/2007  ? HYPERCHOLESTEROLEMIA 02/01/2007  ? Essential hypertension 02/01/2007  ? Osteoarthritis 02/01/2007  ? ?PCP:  Dorothyann Peng, NP ?Pharmacy:   ?[facility], [place] ?  [place] [address] ?Phone: [phone] Fax: [phone] ? ?[facility] ([facility]), [place] ?[address] ?[place] [address] ?Phone: [phone] Fax: [phone] ? ? ? ? ?Social Determinants of Health (SDOH) Interventions ?  ? ?Readmission Risk Interventions ?No flowsheet data found. ? ? ?

## 2021-09-10 NOTE — Progress Notes (Signed)
EEG complete - results pending 

## 2021-09-10 NOTE — Evaluation (Signed)
Physical Therapy Evaluation ?Patient Details ?Name: [name] [name] ?MRN: [id number] ?DOB: [birth year] ?Today's Date: 09/10/2021 ? ?History of Present Illness ? Pt is a 88 y/o female admitted 3/9 with episode of acute AMS.  MRI showed 4mm punctate infart in the right parietal region.  PMHx; vascular dementia, DM2, prior small CVA, HTN, OA.  ?Clinical Impression ? Pt admitted with/for AMS with small infarct on MRI.  Pt likely close to her baseline mobility with changes occurring with her cognition.  Pt currently limited functionally due to the problems listed below.  (see problems list.)  Pt will benefit from PT to maximize function and safety to be able to get home safely with some additional assist/supervision. ?   ?   ? ?Recommendations for follow up therapy are one component of a multi-disciplinary discharge planning process, led by the attending physician.  Recommendations may be updated based on patient status, additional functional criteria and insurance authorization. ? ?Follow Up Recommendations Other (comment) (PACE therapy as needed) ? ?  ?Assistance Recommended at Discharge Intermittent Supervision/Assistance  ?Patient can return home with the following ? Direct supervision/assist for medications management;Assist for transportation (stepped up supervision from PLOF) ? ?  ?Equipment Recommendations    ?Recommendations for Other Services ?    ?  ?Functional Status Assessment Patient has had a recent decline in their functional status and demonstrates the ability to make significant improvements in function in a reasonable and predictable amount of time.  ? ?  ?Precautions / Restrictions Precautions ?Precautions: Fall  ? ?  ? ?Mobility ? Bed Mobility ?Overal bed mobility: Needs Assistance ?Bed Mobility: Sit to Supine, Supine to Sit ?  ?  ?Supine to sit: Supervision ?Sit to supine: Supervision ?  ?General bed mobility comments: pt able to lie down, return to sit to scoot up higher in the bed and re-lie back  without assist. ?  ? ?Transfers ?Overall transfer level: Needs assistance ?  ?Transfers: Sit to/from Stand ?Sit to Stand: Supervision ?  ?  ?  ?  ?  ?General transfer comment: cues for hand placement, generally safe otherwise. ?  ? ?Ambulation/Gait ?Ambulation/Gait assistance: Min guard, Supervision ?Gait Distance (Feet): 300 Feet ?Assistive device: None (rail or stationary surfaces on occasion) ?Gait Pattern/deviations: Step-through pattern ?  ?Gait velocity interpretation: >2.62 ft/sec, indicative of community ambulatory ?  ?General Gait Details: generally steady, but episodes of deviation with scanning or general loss of focus.  gait speed age appropriate. ? ?Stairs ?Stairs: Yes ?Stairs assistance: Supervision ?Stair Management: One rail Right, Alternating pattern, Forwards ?Number of Stairs: 2 ?General stair comments: safe with rail supervised,. ? ?Wheelchair Mobility ?  ? ?Modified Rankin (Stroke Patients Only) ?  ? ?  ? ?Balance Overall balance assessment: Mild deficits observed, not formally tested ?  ?  ?  ?  ?  ?  ?  ?  ?  ?  ?  ?  ?  ?  ?  ?  ?  ?  ?   ? ? ? ?Pertinent Vitals/Pain Pain Assessment ?Pain Assessment: Faces ?Faces Pain Scale: No hurt ?Pain Intervention(s): Monitored during session  ? ? ?Home Living Family/patient expects to be discharged to:: Private residence ?Living Arrangements: Alone ?Available Help at Discharge: Family;Available PRN/intermittently ?Type of Home: House ?Home Access: Stairs to enter ?Entrance Stairs-Rails: Right;Left ?  ?  ?Home Layout: Two level;Able to live on main level with bedroom/bathroom ?  ?   ?  ?Prior Function Prior Level of Function : Independent/Modified Independent;Needs assist ?  Cognitive Assist : Mobility (cognitive) ?  ?  ?Physical Assist : Mobility (physical) ?  ?  ?Mobility Comments: pt able to mobilize at mod I level in the home using stationary surfaces as needed.  Able to get ready and go out to meet the PACE bus. ?  ?  ? ? ?Hand Dominance  ?   ? ?   ?Extremity/Trunk Assessment  ? Upper Extremity Assessment ?Upper Extremity Assessment: Overall WFL for tasks assessed ?  ? ?Lower Extremity Assessment ?Lower Extremity Assessment: Overall WFL for tasks assessed (general weakness proximally, but functional) ?  ? ?Cervical / Trunk Assessment ?Cervical / Trunk Assessment: Normal  ?Communication  ? Communication: HOH;No difficulties  ?Cognition Arousal/Alertness: Awake/alert ?Behavior During Therapy: [facility] for tasks assessed/performed ?Overall Cognitive Status: History of cognitive impairments - at baseline ?  ?  ?  ?  ?  ?  ?  ?  ?  ?  ?  ?  ?  ?  ?  ?  ?  ?  ?  ? ?  ?General Comments   ? ?  ?Exercises    ? ?Assessment/Plan  ?  ?PT Assessment Patient needs continued PT services  ?PT Problem List Decreased activity tolerance;Decreased balance;Decreased mobility;Decreased safety awareness;Decreased knowledge of use of DME;Decreased cognition;Decreased strength ? ?   ?  ?PT Treatment Interventions Gait training;Functional mobility training;Therapeutic activities;Balance training;Patient/family education   ? ?PT Goals (Current goals can be found in the Care Plan section)  ?Acute Rehab PT Goals ?Patient Stated Goal: would like pt to continue to live in her own home. ?PT Goal Formulation: With patient/family ?Time For Goal Achievement: 09/24/21 ?Potential to Achieve Goals: Good ? ?  ?Frequency Min 3X/week ?  ? ? ?Co-evaluation   ?  ?  ?  ?  ? ? ?  ?AM-PAC PT "6 Clicks" Mobility  ?Outcome Measure Help needed turning from your back to your side while in a flat bed without using bedrails?: A Little ?Help needed moving from lying on your back to sitting on the side of a flat bed without using bedrails?: A Little ?Help needed moving to and from a bed to a chair (including a wheelchair)?: A Little ?Help needed standing up from a chair using your arms (e.g., wheelchair or bedside chair)?: A Little ?Help needed to walk in hospital room?: A Little ?Help needed climbing 3-5 steps with  a railing? : A Little ?6 Click Score: 18 ? ?  ?End of Session   ?Activity Tolerance: Patient tolerated treatment well ?Patient left: in bed;with call bell/phone within reach;with bed alarm set;with family/visitor present ?Nurse Communication: Mobility status ?PT Visit Diagnosis: Other abnormalities of gait and mobility (R26.89) ?  ? ?Time: 2952-8413 ?PT Time Calculation (min) (ACUTE ONLY): 32 min ? ? ?Charges:   PT Evaluation ?$PT Eval Moderate Complexity: 1 Mod ?PT Treatments ?$Gait Training: 8-22 mins ?  ?   ? ? ?09/10/2021 ? ?Jacinto Halim., PT ?Acute Rehabilitation Services ?[phone]  (pager) ?[phone]  (office) ? ?Eliseo Gum Dotti Busey ?09/10/2021, 4:46 PM ? ?

## 2021-09-10 NOTE — Evaluation (Signed)
Occupational Therapy Evaluation ?Patient Details ?Name: [name] [name] ?MRN: [id number] ?DOB: 04/05/1929 ?Today's Date: 09/10/2021 ? ? ?History of Present Illness Pt is a 88 y/o female admitted 3/9 with episode of acute AMS.  MRI showed 4mm punctate infart in the right parietal region.  PMHx; vascular dementia, DM2, prior small CVA, HTN, OA.  ? ?Clinical Impression ?  ?Pt admitted for concerns listed above. PTA pt reported that she was independent with BADL's and functional mobility. Family providing assist with all IADL's, due to recent decline in memory/cognition. At this time, physically pt requiring supervision for safety, however cognitively, pt requires min guard due to increased needs for safety and cuing with multistep tasks/commands. OT recommending HH OT/therapy from [facility] to continue addressing cognition and implementing compensatory strategies for home safety. OT will follow acutely.   ?   ? ?Recommendations for follow up therapy are one component of a multi-disciplinary discharge planning process, led by the attending physician.  Recommendations may be updated based on patient status, additional functional criteria and insurance authorization.  ? ?Follow Up Recommendations ? Home health OT  ?  ?Assistance Recommended at Discharge Set up Supervision/Assistance  ?Patient can return home with the following A little help with bathing/dressing/bathroom;Assistance with cooking/housework;Direct supervision/assist for medications management;Direct supervision/assist for financial management;Assist for transportation ? ?  ?Functional Status Assessment ? Patient has had a recent decline in their functional status and demonstrates the ability to make significant improvements in function in a reasonable and predictable amount of time.  ?Equipment Recommendations ? None recommended by OT  ?  ?Recommendations for Other Services   ? ? ?  ?Precautions / Restrictions Precautions ?Precautions: Fall ?Restrictions ?Weight  Bearing Restrictions: No  ? ?  ? ?Mobility Bed Mobility ?Overal bed mobility: Needs Assistance ?Bed Mobility: Supine to Sit ?  ?  ?Supine to sit: Supervision ?  ?  ?General bed mobility comments: No assist needed ?  ? ?Transfers ?Overall transfer level: Needs assistance ?  ?Transfers: Sit to/from Stand ?Sit to Stand: Supervision ?  ?  ?  ?  ?  ?General transfer comment: cues for hand placement, generally safe otherwise. ?  ? ?  ?Balance Overall balance assessment: Mild deficits observed, not formally tested ?  ?  ?  ?  ?  ?  ?  ?  ?  ?  ?  ?  ?  ?  ?  ?  ?  ?  ?   ? ?ADL either performed or assessed with clinical judgement  ? ?ADL Overall ADL's : Needs assistance/impaired ?  ?  ?  ?  ?  ?  ?  ?  ?  ?  ?  ?  ?  ?  ?  ?  ?  ?  ?  ?General ADL Comments: Needs supervision due to cognition and memory, physically pt has no difficulties  ? ? ? ?Vision Baseline Vision/History: 1 Wears glasses ?Ability to See in Adequate Light: 0 Adequate ?Patient Visual Report: No change from baseline ?Vision Assessment?: No apparent visual deficits  ?   ?Perception   ?  ?Praxis   ?  ? ?Pertinent Vitals/Pain Pain Assessment ?Pain Assessment: No/denies pain  ? ? ? ?Hand Dominance   ?  ?Extremity/Trunk Assessment Upper Extremity Assessment ?Upper Extremity Assessment: Overall WFL for tasks assessed ?  ?Lower Extremity Assessment ?Lower Extremity Assessment: Defer to PT evaluation ?  ?Cervical / Trunk Assessment ?Cervical / Trunk Assessment: Normal ?  ?Communication Communication ?Communication: HOH;No difficulties ?  ?  Cognition Arousal/Alertness: Awake/alert ?Behavior During Therapy: [facility] for tasks assessed/performed ?Overall Cognitive Status: History of cognitive impairments - at baseline ?  ?  ?  ?  ?  ?  ?  ?  ?  ?  ?  ?  ?  ?  ?  ?  ?General Comments: Pt with recent scans demonstrating dementia, pt was found wandering around outside, unsure of where she was or what she was doing. At this time, pt not oriented to place, time, or situation.  Follows commands with no difficulties ?  ?  ?General Comments  VSS on RA ? ?  ?Exercises   ?  ?Shoulder Instructions    ? ? ?Home Living Family/patient expects to be discharged to:: Private residence ?Living Arrangements: Alone ?Available Help at Discharge: Family;Available PRN/intermittently ?Type of Home: House ?Home Access: Stairs to enter ?  ?Entrance Stairs-Rails: Right;Left ?Home Layout: Two level;Able to live on main level with bedroom/bathroom ?  ?  ?Bathroom Shower/Tub: Walk-in shower ?  ?Bathroom Toilet: Handicapped height ?Bathroom Accessibility: Yes ?How Accessible: Accessible via walker ?Home Equipment: BSC/3in1;Shower seat;Cane - single point ?  ?Additional Comments: Just started to participate 2x a week with [facility] ?  ? ?  ?Prior Functioning/Environment Prior Level of Function : Independent/Modified Independent;Needs assist ? Cognitive Assist : Mobility (cognitive) ?  ?  ?Physical Assist : Mobility (physical) ?  ?  ?Mobility Comments: pt able to mobilize at mod I level in the home using stationary surfaces as needed.  Able to get ready and go out to meet the [facility] bus. ?ADLs Comments: Has assistance with medication management, cooking, cleaning, and diabetes management ?  ? ?  ?  ?OT Problem List: Decreased strength;Impaired balance (sitting and/or standing);Decreased cognition;Decreased safety awareness;Decreased knowledge of use of DME or AE ?  ?   ?OT Treatment/Interventions: Self-care/ADL training;Therapeutic exercise;DME and/or AE instruction;Therapeutic activities;Cognitive remediation/compensation;Patient/family education;Balance training  ?  ?OT Goals(Current goals can be found in the care plan section) Acute Rehab OT Goals ?Patient Stated Goal: Per daughter: to stay safe ?OT Goal Formulation: With patient ?Time For Goal Achievement: 09/24/21 ?Potential to Achieve Goals: Good ?ADL Goals ?Additional ADL Goal #1: Pt will complete multistep pathfinding task independently. ?Additional ADL Goal #2: Pt  will follow 3+ step tasks independently.  ?OT Frequency: Min 2X/week ?  ? ?Co-evaluation   ?  ?  ?  ?  ? ?  ?AM-[facility] OT "6 Clicks" Daily Activity     ?Outcome Measure Help from another person eating meals?: A Little ?Help from another person taking care of personal grooming?: A Little ?Help from another person toileting, which includes using toliet, bedpan, or urinal?: A Little ?Help from another person bathing (including washing, rinsing, drying)?: A Little ?Help from another person to put on and taking off regular upper body clothing?: A Little ?Help from another person to put on and taking off regular lower body clothing?: A Little ?6 Click Score: 18 ?  ?End of Session Nurse Communication: Mobility status ? ?Activity Tolerance: Patient tolerated treatment well ?Patient left: Other (comment) (Up with PT) ? ?OT Visit Diagnosis: Unsteadiness on feet (R26.81);Muscle weakness (generalized) (M62.81);Other symptoms and signs involving cognitive function  ?              ?Time: 5956-3875 ?OT Time Calculation (min): 28 min ?Charges:  OT General Charges ?$OT Visit: 1 Visit ?OT Evaluation ?$OT Eval Moderate Complexity: 1 Mod ?OT Treatments ?$Self Care/Home Management : 8-22 mins ? ?Noemi Bellissimo H., OTR/L ?Acute Rehabilitation ? ?Fate Caster  Elane Bing Plume ?09/10/2021, 6:18 PM ?

## 2021-09-10 NOTE — Evaluation (Signed)
Clinical/Bedside Swallow Evaluation ?Patient Details  ?Name: [name] [name] ?MRN: [id number] ?Date of Birth: 1929-02-21 ? ?Today's Date: 09/10/2021 ?Time: SLP Start Time (ACUTE ONLY): 0915 SLP Stop Time (ACUTE ONLY): 6295 ?SLP Time Calculation (min) (ACUTE ONLY): 12 min ? ?Past Medical History:  ?Past Medical History:  ?Diagnosis Date  ? Asthma   ? DEGENERATIVE JOINT DISEASE, GENERALIZED 02/01/2007  ? Qualifier: Diagnosis of  By: Amador Cunas  MD, Janett Labella   ? DIARRHEA, CHRONIC 08/20/2009  ? Qualifier: Diagnosis of  By: Amador Cunas  MD, Janett Labella   ? DM 02/01/2007  ? Qualifier: Diagnosis of  By: Calvert Cantor    ? FATIGUE 10/20/2008  ? Qualifier: Diagnosis of  By: Amador Cunas  MD, Janett Labella   ? HYPERCHOLESTEROLEMIA 02/01/2007  ? Qualifier: Diagnosis of  By: Calvert Cantor    ? HYPERLIPIDEMIA 12/20/2007  ? Qualifier: Diagnosis of  By: Amador Cunas  MD, Janett Labella   ? HYPERTENSION 02/01/2007  ? Qualifier: Diagnosis of  By: Calvert Cantor    ? LOW BACK PAIN 01/13/2010  ? Qualifier: Diagnosis of  By: Amador Cunas  MD, Janett Labella   ? MUSCLE CRAMPS 04/06/2010  ? Qualifier: Diagnosis of  By: Amador Cunas  MD, Janett Labella   ? MYALGIA 01/20/2010  ? Qualifier: Diagnosis of  By: Caryl Never MD, Bruce    ? OSTEOARTHRITIS 01/13/2010  ? Qualifier: Diagnosis of  By: Amador Cunas  MD, Janett Labella   ? OVERACTIVE BLADDER 08/20/2009  ? Qualifier: Diagnosis of  By: Amador Cunas  MD, Janett Labella   ? PLEURISY 06/18/2008  ? Qualifier: Diagnosis of  By: Alphonzo Severance MD, Loni Dolly   ? PNEUMONIA 06/18/2008  ? Qualifier: Diagnosis of  By: Alphonzo Severance MD, Loni Dolly   ? Rectal prolapse 09/20/2007  ? Qualifier: Diagnosis of  By: Amador Cunas  MD, Janett Labella   ? VITAMIN B12 DEFICIENCY 07/20/2010  ? Qualifier: Diagnosis of  By: Leone Payor MD, Charlyne Quale   ? ?Past Surgical History:  ?Past Surgical History:  ?Procedure Laterality Date  ? ABDOMINAL HYSTERECTOMY    ? partial- states she has her ovaries  ? APPENDECTOMY    ? BLADDER SURGERY    ? CATARACT EXTRACTION Bilateral 2012  ? CHOLECYSTECTOMY    ? ?HPI:  ?Pt  is a 88 year old female who presented to the ED due to acute altered mental status. MRI brain 3/9: Punctate 4 mm focus of diffusion abnormality involving the right periatrial white matter, likely a small acute small vessel type infarct. Dx Acute metabolic encephalopathy. PMH: vascular dementia, prior small infarcts bilateral cerebral white matter, HTN, HLD, T2DM, chronic urinary incontinence, chronic diarrhea, depression.  ?  ?Assessment / Plan / Recommendation  ?Clinical Impression ? Pt was seen for bedside swallow evaluation and she denied a history of dysphagia. Oral mechanism exam was limited due to pt's difficulty following some commands; however, oral motor strength and ROM appeared grossly WFL. Dentition was reduced, but adequate for mastication. She tolerated all solids and liquids without signs or symptoms of oropharyngeal dysphagia. A regular texture diet with thin liquids is recommended at this time and SLP will follow briefly to ensure tolerance. ?SLP Visit Diagnosis: Dysphagia, unspecified (R13.10) ?   ?Aspiration Risk ? Mild aspiration risk  ?  ?Diet Recommendation Regular;Thin liquid  ? ?Liquid Administration via: Straw;Cup ?Medication Administration: Whole meds with puree (or with liquid; as tolerated) ?Supervision: Patient able to self feed (may need help with set up) ?Compensations: Minimize environmental distractions ?Postural Changes: Seated upright at 90 degrees  ?  ?  Other  Recommendations Oral Care Recommendations: Oral care BID   ? ?Recommendations for follow up therapy are one component of a multi-disciplinary discharge planning process, led by the attending physician.  Recommendations may be updated based on patient status, additional functional criteria and insurance authorization. ? ?Follow up Recommendations  (TBD)  ? ? ?  ?Assistance Recommended at Discharge    ?Functional Status Assessment    ?Frequency and Duration min 1 x/week  ?1 week ?  ?   ? ?Prognosis Prognosis for Safe Diet  Advancement: Good ?Barriers to Reach Goals: Cognitive deficits  ? ?  ? ?Swallow Study   ?General Date of Onset: 09/09/21 ?HPI: Pt is a 88 year old female who presented to the ED due to acute altered mental status. MRI brain 3/9: Punctate 4 mm focus of diffusion abnormality involving the right periatrial white matter, likely a small acute small vessel type infarct. Dx Acute metabolic encephalopathy. PMH: vascular dementia, prior small infarcts bilateral cerebral white matter, HTN, HLD, T2DM, chronic urinary incontinence, chronic diarrhea, depression. ?Type of Study: Bedside Swallow Evaluation ?Previous Swallow Assessment: none ?Diet Prior to this Study: NPO ?Temperature Spikes Noted: No ?Respiratory Status: Room air ?History of Recent Intubation: No ?Behavior/Cognition: Alert;Cooperative;Pleasant mood;Requires cueing ?Oral Cavity Assessment: Within Functional Limits ?Oral Care Completed by SLP: No ?Oral Cavity - Dentition: Adequate natural dentition;Missing dentition ?Vision: Functional for self-feeding ?Self-Feeding Abilities: Able to feed self ?Patient Positioning: Upright in bed;Postural control adequate for testing ?Baseline Vocal Quality: Normal ?Volitional Cough: Strong ?Volitional Swallow: Able to elicit  ?  ?Oral/Motor/Sensory Function Overall Oral Motor/Sensory Function: Within functional limits   ?Ice Chips Ice chips: Within functional limits ?Presentation: Spoon   ?Thin Liquid Thin Liquid: Within functional limits ?Presentation: Cup;Straw  ?  ?Nectar Thick Nectar Thick Liquid: Not tested   ?Honey Thick Honey Thick Liquid: Not tested   ?Puree Puree: Within functional limits ?Presentation: Spoon   ?Solid ? ? ?  Solid: Within functional limits ?Presentation: Self Fed  ? ?  ?Bellamy Judson I. Vear Clock, MS, CCC-SLP ?Acute Rehabilitation Services ?Office number [phone] ?Pager [phone] ? ?Scheryl Marten ?09/10/2021,9:27 AM ? ? ? ? ? ?

## 2021-09-10 NOTE — Progress Notes (Signed)
Hypoglycemic Event ? ?CBG: 68 ? ?Treatment: 8 oz juice/soda ? ?Symptoms: None ? ?Follow-up CBG: Time:0128 CBG Result:98 ? ?Possible Reasons for Event: Unknown ? ?Comments/MD notified:Dr. Notified ? ? ? ?Louanne Skye ? ? ?

## 2021-09-11 DIAGNOSIS — F0390 Unspecified dementia without behavioral disturbance: Secondary | ICD-10-CM

## 2021-09-11 LAB — CBC
HCT: 36.4 % (ref 36.0–46.0)
Hemoglobin: 11.9 g/dL — ABNORMAL LOW (ref 12.0–15.0)
MCH: 30.5 pg (ref 26.0–34.0)
MCHC: 32.7 g/dL (ref 30.0–36.0)
MCV: 93.3 fL (ref 80.0–100.0)
Platelets: 198 10*3/uL (ref 150–400)
RBC: 3.9 MIL/uL (ref 3.87–5.11)
RDW: 12.4 % (ref 11.5–15.5)
WBC: 7.1 10*3/uL (ref 4.0–10.5)
nRBC: 0 % (ref 0.0–0.2)

## 2021-09-11 LAB — GLUCOSE, CAPILLARY
Glucose-Capillary: 201 mg/dL — ABNORMAL HIGH (ref 70–99)
Glucose-Capillary: 306 mg/dL — ABNORMAL HIGH (ref 70–99)

## 2021-09-11 LAB — BASIC METABOLIC PANEL
Anion gap: 7 (ref 5–15)
BUN: 18 mg/dL (ref 8–23)
CO2: 25 mmol/L (ref 22–32)
Calcium: 9 mg/dL (ref 8.9–10.3)
Chloride: 108 mmol/L (ref 98–111)
Creatinine, Ser: 1.43 mg/dL — ABNORMAL HIGH (ref 0.44–1.00)
GFR, Estimated: 35 mL/min — ABNORMAL LOW (ref >60)
Glucose, Bld: 215 mg/dL — ABNORMAL HIGH (ref 70–99)
Potassium: 4.8 mmol/L (ref 3.5–5.1)
Sodium: 140 mmol/L (ref 135–145)

## 2021-09-11 LAB — URINE CULTURE: Culture: NO GROWTH

## 2021-09-11 MED ORDER — CLOPIDOGREL BISULFATE 75 MG PO TABS: 75.0000 mg | ORAL_TABLET | Freq: Every day | ORAL | 0 refills | Status: AC

## 2021-09-11 NOTE — Progress Notes (Signed)
Patient discharging home with son.Vital signs stable at time of discharge as reflected in discharge summary. Discharge instructions given to patient and son, verbal understanding returned. No questions at this time. Patient to follow up with [facility]. ?

## 2021-09-11 NOTE — TOC Transition Note (Signed)
Transition of Care (TOC) - CM/SW Discharge Note ? ? ?Patient Details  ?Name: [name] [name] ?MRN: [id number] ?Date of Birth: [birth year] ? ?Transition of Care (TOC) CM/SW Contact:  ?Carles Collet, RN ?Phone Number: ?09/11/2021, 1:38 PM ? ? ?Clinical Narrative:    ?[facility] active w [facility]. Notified after hours nurse of DC. They will follow up with scheduling PCP appointment and assessing for any potential HH/ OP therapy needs.  ?Patient has family to provide transportation home ? ? ? ?  ?Barriers to Discharge: Continued Medical Work up ? ? ?Patient Goals and CMS Choice ?  ?  ?  ? ?Discharge Placement ?  ?           ?  ?  ?  ?  ? ?Discharge Plan and Services ?In-house Referral: Clinical Social Work ?Discharge Planning Services: CM Consult ?           ?  ?  ?  ?  ?  ?  ?  ?  ?  ?  ? ?Social Determinants of Health (SDOH) Interventions ?  ? ? ?Readmission Risk Interventions ?No flowsheet data found. ? ? ? ? ?

## 2021-09-11 NOTE — Discharge Summary (Signed)
Name: [name] [name] MRN: [id number] DOB: Mar 13, 1929 88 y.o. PCP: Dorothyann Peng, NP  Date of Admission: 09/09/2021  4:50 PM Date of Discharge: 09/11/2021 Attending Physician: Velna Ochs, MD  Discharge Diagnosis: 1. Small acute CVA 2. Vascular dementia 3. T2DM 4. Frequent falls  Discharge Medications: Allergies as of 09/11/2021       Reactions   Metformin Diarrhea   Sitagliptin Phosphate Other (See Comments)   abdominal pain        Medication List     STOP taking these medications    NovoLOG FlexPen 100 UNIT/ML FlexPen Generic drug: insulin aspart   trazodone 300 MG tablet Commonly known as: DESYREL   venlafaxine XR 75 MG 24 hr capsule Commonly known as: EFFEXOR-XR       TAKE these medications    Accu-Chek Aviva Plus test strip Generic drug: glucose blood Use to check blood sugar 3 times a day.   Accu-Chek Guide test strip Generic drug: glucose blood CHECK BLOOD SUGAR THREE TIMES DAILY   Accu-Chek Aviva Plus w/Device Kit Use to check blood sugar 3 times a day   Accu-Chek Guide w/Device Kit Use as directed   Accu-Chek Guide Control Liqd Use as directed   Acetaminophen 650 MG Tabs Take 1 tablet (650 mg total) by mouth 3 (three) times daily as needed.   aspirin 81 MG tablet Take 81 mg by mouth daily.   BD Pen Needle Nano U/F 32G X 4 MM Misc Generic drug: Insulin Pen Needle USE TO INJECT INSULIN 4 TIMES DAILY AS DIRECTED.   calcium-vitamin D 500-200 MG-UNIT tablet Commonly known as: OSCAL WITH D Take 1 tablet by mouth daily.   clopidogrel 75 MG tablet Commonly known as: PLAVIX Take 1 tablet (75 mg total) by mouth daily. Start taking on: September 12, 2021   fluticasone 50 MCG/ACT nasal spray Commonly known as: FLONASE Place 1 spray into the nose daily.   FreeStyle Libre 14 Day Sensor Misc Use to check blood glucose 3 times a day   Glucagon 3 MG/DOSE Powd Place 3 mg into the nose once as needed for up to 1 dose.   insulin NPH  Human 100 UNIT/ML injection Commonly known as: NovoLIN N Inject 0.28 mLs (28 Units total) into the skin daily before breakfast. Pens please   multivitamin tablet Take 1 tablet by mouth daily.   onetouch ultrasoft lancets Use as instructed to test 3 times daily   simvastatin 40 MG tablet Commonly known as: ZOCOR TAKE 1 TABLET EVERY DAY What changed: when to take this   Trulicity 3 JM/4.2AS Sopn Generic drug: Dulaglutide Inject 3 mg into the skin once a week.        Disposition and follow-up:   Ms.[name] [name] was discharged from [facility] in [place] condition.  At the hospital follow up visit please address:  1.  Acute small punctate infarct of periatrial white matter. Hx of vascular dementia. No residual deficits aside from mild, intermittent word salad. Will stay with children until Monday and follow up with [facility] on Monday for ongoing comprehensive care in the outpatient setting.  2. T2DM: Avoid aggressive glycemic control going forward in this elderly patient with advanced dementia. Discontinued meal-time insulin (reported that patient not taking). Adjust medications in the outpatient setting.  2.  Labs / imaging needed at time of follow-up: BMP  3.  Pending labs/ test needing follow-up: none  Follow-up Appointments:  Follow-up Information     Delice Lesch,  Lezlie Octave, MD. Schedule an appointment as soon as possible for a visit in 1 month(s).   Specialty: Neurology Contact information: [address] [place] [address] [place] Hospital Course by problem list: 1. Acute small 30m punctate infarct of periatrial white matter 2. Hx of Vascular Dementia w/ prior small CVAs -Patient admitted for evaluation of new behavior of elopement and long distance wandering along with change in mental status. Initial workup negative, but MRI brain revealed small vessel stroke manifested primarily as episodic language  disturbance (nonsensical words, some slurring). Otherwise, no toxic, metabolic, infectious, or traumatic etiologies identified. She improved quickly and has returned to baseline per family. Remained concerned about supervision going forward but agreed on plan to arrange [facility] appointment on Monday for ongoing care in the outpatient setting. Unclear if family will decide to pursue long-term memory care placement or continue with [facility]. Discontinued effexor and trazodone at discharge (unclear if patient was taking it, but discussed these changes with Dr. KBradd Burnerof [facility]). She will be discharged with DAPT x3 weeks, then ASA alone.   Subjective:  Patient is doing well today. States that she does not want to go to a nursing facility. She is sitting comfortably in her chair and would like to go home. No overnight events and no complaints at this time.  Discharge Exam:   BP (!) 143/87 (BP Location: Right Arm)    Pulse 75    Temp 98 F (36.7 C) (Oral)    Resp 16    SpO2 96%  Discharge exam:  General: elderly female sitting comfortably in chair, NAD. HENT: NCAT, mucous membranes moist. CV: normal rate and regular rhythm, no m/r/g. Pulm: normal work of breathing on room air. MSK: moving all four extremities Neuro: AAOx2 (self and location), but not to time or situation. Follows commands. Psych: normal affect.  Pertinent Labs, Studies, and Procedures:  CBC Latest Ref Rng & Units 09/11/2021 09/10/2021 09/09/2021  WBC 4.0 - 10.5 K/uL 7.1 7.8 -  Hemoglobin 12.0 - 15.0 g/dL 11.9(L) 11.7(L) 12.9  Hematocrit 36.0 - 46.0 % 36.4 34.4(L) 38.0  Platelets 150 - 400 K/uL 198 201 -   CMP Latest Ref Rng & Units 09/11/2021 09/10/2021 09/09/2021  Glucose 70 - 99 mg/dL 215(H) 134(H) 118(H)  BUN 8 - 23 mg/dL 18 17 32(H)  Creatinine 0.44 - 1.00 mg/dL 1.43(H) 1.30(H) 1.50(H)  Sodium 135 - 145 mmol/L 140 136 137  Potassium 3.5 - 5.1 mmol/L 4.8 4.1 4.6  Chloride 98 - 111 mmol/L 108 103 103  CO2 22 - 32 mmol/L 25 26 -   Calcium 8.9 - 10.3 mg/dL 9.0 8.4(L) -  Total Protein 6.5 - 8.1 g/dL - 6.3(L) -  Total Bilirubin 0.3 - 1.2 mg/dL - 0.5 -  Alkaline Phos 38 - 126 U/L - 34(L) -  AST 15 - 41 U/L - 35 -  ALT 0 - 44 U/L - 22 -   Urinalysis    Component Value Date/Time   COLORURINE YELLOW 09/09/2021 1902   APPEARANCEUR CLEAR 09/09/2021 1902   LABSPEC 1.018 09/09/2021 1902   PHURINE 5.0 09/09/2021 1902   GLUCOSEU 50 (A) 09/09/2021 1902   GLUCOSEU 250 (A) 08/21/2020 1025   HGBUR NEGATIVE 09/09/2021 1902   HGBUR negative 03/12/2010 [place]/03/2022 1902   BILIRUBINUR n 03/09/2012 1108   [place]03/03/2022 1902   PROTEINUR 100 (A)  09/09/2021 1902   UROBILINOGEN 0.2 08/21/2020 1025   NITRITE NEGATIVE 09/09/2021 1902   LEUKOCYTESUR NEGATIVE 09/09/2021 1902    Blood Culture    Component Value Date/Time   SDES URINE, CLEAN CATCH 09/09/2021 1902   SPECREQUEST NONE 09/09/2021 1902   CULT  09/09/2021 1902    NO GROWTH Performed at [facility], [place] [address] [place], [place] [address]    REPTSTATUS 09/11/2021 FINAL 09/09/2021 1902   Drugs of Abuse     Component Value Date/Time   LABOPIA NONE DETECTED 09/09/2021 1902   COCAINSCRNUR NONE DETECTED 09/09/2021 1902   LABBENZ NONE DETECTED 09/09/2021 1902   AMPHETMU NONE DETECTED 09/09/2021 1902   THCU NONE DETECTED 09/09/2021 1902   LABBARB NONE DETECTED 09/09/2021 1902    Lipid Panel     Component Value Date/Time   CHOL 175 09/10/2021 0752   TRIG 168 (H) 09/10/2021 0752   HDL 44 09/10/2021 0752   CHOLHDL 4.0 09/10/2021 0752   VLDL 34 09/10/2021 0752   LDLCALC 97 09/10/2021 0752   LDLCALC 65 04/08/2020 1421   LDLDIRECT 106.0 06/11/2021 1202    CT HEAD WO CONTRAST  Result Date: 09/09/2021 CLINICAL DATA:  History of a recent fall.  Mental status change. EXAM: CT HEAD WITHOUT CONTRAST TECHNIQUE: Contiguous axial images were obtained from the base of the skull through the vertex without intravenous  contrast. RADIATION DOSE REDUCTION: This exam was performed according to the departmental dose-optimization program which includes automated exposure control, adjustment of the mA and/or kV according to patient size and/or use of iterative reconstruction technique. COMPARISON:  MRI brain 09/04/2020 FINDINGS: Brain: Stable age related cerebral atrophy, ventriculomegaly and periventricular white matter disease. No extra-axial fluid collections are identified. No CT findings for acute hemispheric infarction or intracranial hemorrhage. No mass lesions. The brainstem and cerebellum are normal. Vascular: Vascular calcifications but no aneurysm hyperdense vessels. Skull: No skull fracture or bone lesions. Sinuses/Orbits: The paranasal sinuses and mastoid air cells are clear. The globes are intact. Other: No scalp lesions or scalp hematoma. IMPRESSION: 1. Stable age related cerebral atrophy, ventriculomegaly and periventricular white matter disease. 2. No acute intracranial findings or skull fracture. Electronically Signed   By: Marijo Sanes M.D.   On: 09/09/2021 17:49   MR BRAIN WO CONTRAST  Result Date: 09/09/2021 CLINICAL DATA:  Initial evaluation for neuro deficit, stroke suspected. Altered mental status. EXAM: MRI HEAD WITHOUT CONTRAST TECHNIQUE: Multiplanar, multiecho pulse sequences of the brain and surrounding structures were obtained without intravenous contrast. COMPARISON:  Prior CT from earlier the same day. FINDINGS: Brain: Examination technically limited as the patient was unable to tolerate the full length of the study. Diffusion-weighted sequences, T2 and FLAIR sequences, and sagittal T1 weighted sequence only were performed. Additionally, provided images are moderately to severely degraded by motion artifact. Generalized age-related cerebral atrophy with moderate chronic microvascular ischemic disease. Punctate 4 mm focus of diffusion abnormality seen involving the right periatrial white matter, likely  a small acute small vessel type infarct (series 7, image 85). No associated mass effect. No visible hemorrhage at this location on prior CT. No other diffusion abnormality to suggest acute or subacute ischemia. Gray-white matter differentiation otherwise grossly maintained. No visible areas of chronic cortical infarction. No definite evidence for intracranial hemorrhage on this technically limited exam. No mass lesion, mass effect or midline shift. Diffuse ventricular prominence related to global parenchymal volume loss of hydrocephalus. No extra-axial fluid collection. Pituitary gland suprasellar region normal. Vascular: Major intracranial vascular flow  voids are grossly maintained at the skull base. Skull and upper cervical spine: Craniocervical junction within normal limits. Bone marrow signal intensity normal. No scalp soft tissue abnormality. Sinuses/Orbits: Prior bilateral ocular lens replacement. Globes and orbital soft tissues demonstrate no acute finding. Scattered mucosal thickening noted within the ethmoidal air cells and maxillary sinuses. Paranasal sinuses are otherwise clear. Small left with moderate right mastoid effusions. Visualized nasopharynx unremarkable. Other: None. IMPRESSION: 1. Technically limited exam due to motion artifact and the patient's inability to tolerate the full length of the study. 2. Punctate 4 mm focus of diffusion abnormality involving the right periatrial white matter, likely a small acute small vessel type infarct. No associated mass effect. 3. No other acute intracranial abnormality. 4. Age-related cerebral atrophy with moderate chronic microvascular ischemic disease. Electronically Signed   By: Jeannine Boga M.D.   On: 09/09/2021 23:44   DG Chest Portable 1 View  Result Date: 09/09/2021 CLINICAL DATA:  Weakness. EXAM: PORTABLE CHEST 1 VIEW COMPARISON:  Chest x-ray 03/30/2016 there is stable mild FINDINGS: Elevation of the right hemidiaphragm. The heart size and  mediastinal contours are within normal limits. Both lungs are clear. The visualized skeletal structures are unremarkable. IMPRESSION: No active disease. Electronically Signed   By: Ronney Asters M.D.   On: 09/09/2021 17:22   EEG adult  Result Date: 09/10/2021 Lora Havens, MD     09/10/2021 12:04 PM Patient Name: CITLALY CAMPLIN MRN: [id number] Epilepsy Attending: Lora Havens Referring Physician/Provider: Gwinda Maine, MD Date: 09/10/2021 Duration: 39.42 mins Patient history: 88 y.o. female PMHx as noted above on home aspirin 81 mg daily presented to the ED encephalopathic found to have small punctate focus of increased diffusion signal in the right parietal white matter. EEG to evaluate for seizure Level of alertness: Awake, asleep AEDs during EEG study: None Technical aspects: This EEG study was done with scalp electrodes positioned according to the 10-20 International system of electrode placement. Electrical activity was acquired at a sampling rate of 500Hz and reviewed with a high frequency filter of 70Hz and a low frequency filter of 1Hz. EEG data were recorded continuously and digitally stored. Description: The posterior dominant rhythm consists of 8 Hz activity of moderate voltage (25-35 uV) seen predominantly in posterior head regions, symmetric and reactive to eye opening and eye closing. Sleep was characterized by vertex waves, sleep spindles (12 to 14 Hz), maximal frontocentral region. Hyperventilation and photic stimulation were not performed.   IMPRESSION: This study is within normal limits. No seizures or epileptiform discharges were seen throughout the recording. Lora Havens   ECHOCARDIOGRAM COMPLETE  Result Date: 09/10/2021    ECHOCARDIOGRAM REPORT   Patient Name:   SONI KEGEL Date of Exam: 09/10/2021 Medical Rec #:  [id number]          Height:       63.0 in Accession #:    [id number]         Weight:       159.7 lb Date of Birth:  09/26/1928         BSA:           1.757 m Patient Age:    17 years           BP:           137/75 mmHg Patient Gender: F                  HR:  65 bpm. Exam Location:  Inpatient Procedure: 2D Echo, Cardiac Doppler and Color Doppler Indications:    Stroke  History:        Patient has no prior history of Echocardiogram examinations.                 Risk Factors:Hypertension and Diabetes.  Sonographer:    Jyl Heinz Referring Phys: [place]  1. Left ventricular ejection fraction, by estimation, is 55 to 60%. The left ventricle has normal function. The left ventricle has no regional wall motion abnormalities. There is mild asymmetric left ventricular hypertrophy of the basal-septal segment. Left ventricular diastolic parameters are consistent with Grade I diastolic dysfunction (impaired relaxation).  2. Right ventricular systolic function is normal. The right ventricular size is normal. There is normal pulmonary artery systolic pressure.  3. The mitral valve is grossly normal. No evidence of mitral valve regurgitation. No evidence of mitral stenosis.  4. The aortic valve is tricuspid. Aortic valve regurgitation is mild. Aortic valve sclerosis is present, with no evidence of aortic valve stenosis.  5. Aortic dilatation noted. There is mild dilatation of the ascending aorta, measuring 42 mm.  6. The inferior vena cava is normal in size with greater than 50% respiratory variability, suggesting right atrial pressure of 3 mmHg. Comparison(s): No prior Echocardiogram. FINDINGS  Left Ventricle: Left ventricular ejection fraction, by estimation, is 55 to 60%. The left ventricle has normal function. The left ventricle has no regional wall motion abnormalities. The left ventricular internal cavity size was normal in size. There is  mild asymmetric left ventricular hypertrophy of the basal-septal segment. Left ventricular diastolic parameters are consistent with Grade I diastolic dysfunction (impaired relaxation). Right  Ventricle: The right ventricular size is normal. No increase in right ventricular wall thickness. Right ventricular systolic function is normal. There is normal pulmonary artery systolic pressure. The tricuspid regurgitant velocity is 2.62 m/s, and  with an assumed right atrial pressure of 3 mmHg, the estimated right ventricular systolic pressure is 43.1 mmHg. Left Atrium: Left atrial size was normal in size. Right Atrium: Right atrial size was normal in size. Pericardium: There is no evidence of pericardial effusion. Mitral Valve: The mitral valve is grossly normal. Mild to moderate mitral annular calcification. No evidence of mitral valve regurgitation. No evidence of mitral valve stenosis. Tricuspid Valve: The tricuspid valve is normal in structure. Tricuspid valve regurgitation is mild. Aortic Valve: The aortic valve is tricuspid. Aortic valve regurgitation is mild. Aortic regurgitation PHT measures 643 msec. Aortic valve sclerosis is present, with no evidence of aortic valve stenosis. Aortic valve mean gradient measures 9.0 mmHg. Aortic valve peak gradient measures 15.1 mmHg. Aortic valve area, by VTI measures 1.42 cm. Pulmonic Valve: The pulmonic valve was normal in structure. Pulmonic valve regurgitation is not visualized. No evidence of pulmonic stenosis. Aorta: Aortic dilatation noted. There is mild dilatation of the ascending aorta, measuring 42 mm. Venous: The inferior vena cava is normal in size with greater than 50% respiratory variability, suggesting right atrial pressure of 3 mmHg. IAS/Shunts: No atrial level shunt detected by color flow Doppler.  LEFT VENTRICLE PLAX 2D LVIDd:         3.90 cm     Diastology LVIDs:         2.30 cm     LV e' medial:    6.74 cm/s LV PW:         1.20 cm     LV E/e' medial:  13.1 LV IVS:  1.20 cm     LV e' lateral:   7.18 cm/s LVOT diam:     2.00 cm     LV E/e' lateral: 12.3 LV SV:         67 LV SV Index:   38 LVOT Area:     3.14 cm  LV Volumes (MOD) LV vol d, MOD  A2C: 70.3 ml LV vol d, MOD A4C: 71.8 ml LV vol s, MOD A2C: 30.4 ml LV vol s, MOD A4C: 30.4 ml LV SV MOD A2C:     39.9 ml LV SV MOD A4C:     71.8 ml LV SV MOD BP:      40.8 ml RIGHT VENTRICLE            IVC RV Basal diam:  2.60 cm    IVC diam: 2.00 cm RV Mid diam:    1.30 cm RV S prime:     9.25 cm/s TAPSE (M-mode): 1.7 cm LEFT ATRIUM             Index        RIGHT ATRIUM          Index LA diam:        3.10 cm 1.76 cm/m   RA Area:     9.52 cm LA Vol (A2C):   41.1 ml 23.39 ml/m  RA Volume:   17.90 ml 10.19 ml/m LA Vol (A4C):   28.7 ml 16.33 ml/m LA Biplane Vol: 34.8 ml 19.80 ml/m  AORTIC VALVE AV Area (Vmax):    1.70 cm AV Area (Vmean):   1.66 cm AV Area (VTI):     1.42 cm AV Vmax:           194.00 cm/s AV Vmean:          137.667 cm/s AV VTI:            0.475 m AV Peak Grad:      15.1 mmHg AV Mean Grad:      9.0 mmHg LVOT Vmax:         105.00 cm/s LVOT Vmean:        72.750 cm/s LVOT VTI:          0.214 m LVOT/AV VTI ratio: 0.45 AI PHT:            643 msec  AORTA Ao Root diam: 3.20 cm Ao Asc diam:  4.00 cm MITRAL VALVE                TRICUSPID VALVE MV Area (PHT): 2.82 cm     TR Peak grad:   27.5 mmHg MV Decel Time: 269 msec     TR Vmax:        262.00 cm/s MV E velocity: 88.60 cm/s MV A velocity: 108.00 cm/s  SHUNTS MV E/A ratio:  0.82         Systemic VTI:  0.21 m                             Systemic Diam: 2.00 cm Rudean Haskell MD Electronically signed by Rudean Haskell MD Signature Date/Time: 09/10/2021/2:20:11 PM    Final    VAS US CAROTID  Result Date: 09/10/2021 Carotid Arterial Duplex Study Patient Name:  DNIYA NEUHAUS  Date of Exam:   09/10/2021 Medical Rec #: [id number]           Accession #:    [id number] Date of Birth: 1929/03/04  Patient Gender: F Patient Age:   82 years Exam Location:  [facility] Procedure:      VAS US CAROTID Referring Phys: Cornelius Moras XU --------------------------------------------------------------------------------  Indications:      CVA. Risk  Factors:     Hypertension, hyperlipidemia, Diabetes, past history of                   smoking. Comparison Study: Prior carotid duplex 03/08/13 Performing Technologist: Darlin Coco RDMS, RVT  Examination Guidelines: A complete evaluation includes B-mode imaging, spectral Doppler, color Doppler, and power Doppler as needed of all accessible portions of each vessel. Bilateral testing is considered an integral part of a complete examination. Limited examinations for reoccurring indications may be performed as noted.  Right Carotid Findings: +----------+--------+--------+--------+-------------------------+--------+             PSV cm/s EDV cm/s Stenosis Plaque Description        Comments  +----------+--------+--------+--------+-------------------------+--------+  CCA Prox   61       10                                                    +----------+--------+--------+--------+-------------------------+--------+  CCA Distal 60       12                                                    +----------+--------+--------+--------+-------------------------+--------+  ICA Prox   71       16       1-39%    calcific and heterogenous           +----------+--------+--------+--------+-------------------------+--------+  ICA Distal 65       15                                                    +----------+--------+--------+--------+-------------------------+--------+  ECA        254               >50%                                         +----------+--------+--------+--------+-------------------------+--------+ +----------+--------+-------+----------------+-------------------+             PSV cm/s EDV cms Describe         Arm Pressure (mmHG)  +----------+--------+-------+----------------+-------------------+  Subclavian 105              Multiphasic, WNL                      +----------+--------+-------+----------------+-------------------+ +---------+--------+--+--------+-+---------+  Vertebral PSV cm/s 22 EDV cm/s 4 Antegrade   +---------+--------+--+--------+-+---------+  Left Carotid Findings: +----------+--------+--------+--------+-----------------------+--------+             PSV cm/s EDV cm/s Stenosis Plaque Description      Comments  +----------+--------+--------+--------+-----------------------+--------+  CCA Prox   51       10                                                  +----------+--------+--------+--------+-----------------------+--------+  CCA Distal 58       11                heterogenous and smooth           +----------+--------+--------+--------+-----------------------+--------+  ICA Prox   55       14                                                  +----------+--------+--------+--------+-----------------------+--------+  ICA Distal 42       8                                                   +----------+--------+--------+--------+-----------------------+--------+  ECA        281               >50%     calcific                          +----------+--------+--------+--------+-----------------------+--------+ +----------+--------+--------+----------------+-------------------+             PSV cm/s EDV cm/s Describe         Arm Pressure (mmHG)  +----------+--------+--------+----------------+-------------------+  Subclavian 119               Multiphasic, WNL                      +----------+--------+--------+----------------+-------------------+ +---------+--------+--+--------+--+---------+  Vertebral PSV cm/s 40 EDV cm/s 10 Antegrade  +---------+--------+--+--------+--+---------+   Summary: Right Carotid: Velocities in the right ICA are consistent with a 1-39% stenosis.                The ECA appears >50% stenosed. Left Carotid: Velocities in the left ICA are consistent with a 1-39% stenosis.               The ECA appears >50% stenosed. Vertebrals:  Bilateral vertebral arteries demonstrate antegrade flow. Subclavians: Normal flow hemodynamics were seen in bilateral subclavian              arteries. *See table(s) above for  measurements and observations.     Preliminary      Discharge Instructions: Discharge Instructions     Ambulatory referral to Neurology   Complete by: As directed    Follow up with Dr. Delice Lesch at [facility] in 4-6 weeks.  Patient is Dr. Amparo Bristol patient. Thanks.   Diet - low sodium heart healthy   Complete by: As directed    Discharge instructions   Complete by: As directed    Ms. Schreiter,  1. Please make sure to go see [facility] on Monday. They will be able to help with your care going forward from this hospitalization. 2. Please make sure to take one tablet of Plavix daily for the next 19 days (this medicine to reduce the risk of another stroke from happening in the next 3 weeks). I have sent this medication to the [facility] on [address].  3. STOP taking effexor and trazodone at home.   Increase activity slowly   Complete by: As directed        Signed: Virl Axe, MD 09/11/2021, 1:07 PM   Pager: [phone]

## 2021-09-11 NOTE — Progress Notes (Signed)
Physical Therapy Treatment ?Patient Details ?Name: [name] [name] ?MRN: [id number] ?DOB: 09/15/1928 ?Today's Date: 09/11/2021 ? ? ?History of Present Illness Pt is a 88 y/o female admitted 3/9 with episode of acute AMS.  MRI showed 4mm punctate infart in the right parietal region.  PMHx; vascular dementia, DM2, prior small CVA, HTN, OA. ? ?  ?PT Comments  ? ? Patient progressing well towards physical therapy goals. Patient prefers HHAx1 for comfort with mobility. Encouraged use of cane at home for stability, patient verbalized understanding. Patient at supervision level for ambulation. She does demo mild balance deficits. D/c plan remains appropriate.  ?   ?Recommendations for follow up therapy are one component of a multi-disciplinary discharge planning process, led by the attending physician.  Recommendations may be updated based on patient status, additional functional criteria and insurance authorization. ? ?Follow Up Recommendations ? Other (comment) (PACE therapy as needed) ?  ?  ?Assistance Recommended at Discharge Intermittent Supervision/Assistance  ?Patient can return home with the following Direct supervision/assist for medications management;Assist for transportation (stepped up supervision from PLOF) ?  ?Equipment Recommendations ? None recommended by PT  ?  ?Recommendations for Other Services   ? ? ?  ?Precautions / Restrictions Precautions ?Precautions: Fall ?Restrictions ?Weight Bearing Restrictions: No  ?  ? ?Mobility ? Bed Mobility ?  ?  ?  ?  ?  ?  ?  ?General bed mobility comments: in recliner on arrival ?  ? ?Transfers ?Overall transfer level: Needs assistance ?Equipment used: None ?Transfers: Sit to/from Stand ?Sit to Stand: Supervision ?  ?  ?  ?  ?  ?  ?  ? ?Ambulation/Gait ?Ambulation/Gait assistance: Supervision ?Gait Distance (Feet): 350 Feet ?Assistive device: 1 person hand held assist ?Gait Pattern/deviations: Step-through pattern, Drifts right/left ?Gait velocity: decreased ?  ?   ?General Gait Details: supervision for safety. Feels more comfortable with HHAx1. Encouraged use of cane at home for stability. No overt LOB. Drifting L/R throughout ambulation ? ? ?Stairs ?  ?  ?  ?  ?  ? ? ?Wheelchair Mobility ?  ? ?Modified Rankin (Stroke Patients Only) ?  ? ? ?  ?Balance Overall balance assessment: Mild deficits observed, not formally tested ?  ?  ?  ?  ?  ?  ?  ?  ?  ?  ?  ?  ?  ?  ?  ?  ?  ?  ?  ? ?  ?Cognition Arousal/Alertness: Awake/alert ?Behavior During Therapy: [facility] for tasks assessed/performed ?Overall Cognitive Status: History of cognitive impairments - at baseline ?  ?  ?  ?  ?  ?  ?  ?  ?  ?  ?  ?  ?  ?  ?  ?  ?  ?  ?  ? ?  ?Exercises   ? ?  ?General Comments   ?  ?  ? ?Pertinent Vitals/Pain Pain Assessment ?Pain Assessment: No/denies pain  ? ? ?Home Living   ?  ?  ?  ?  ?  ?  ?  ?  ?  ?   ?  ?Prior Function    ?  ?  ?   ? ?PT Goals (current goals can now be found in the care plan section) Acute Rehab PT Goals ?Patient Stated Goal: would like pt to continue to live in her own home. ?PT Goal Formulation: With patient/family ?Time For Goal Achievement: 09/24/21 ?Potential to Achieve Goals: Good ?Progress towards PT goals: Progressing toward goals ? ?  ?  Frequency ? ? ? Min 3X/week ? ? ? ?  ?PT Plan Current plan remains appropriate  ? ? ?Co-evaluation   ?  ?  ?  ?  ? ?  ?AM-PAC PT "6 Clicks" Mobility   ?Outcome Measure ? Help needed turning from your back to your side while in a flat bed without using bedrails?: A Little ?Help needed moving from lying on your back to sitting on the side of a flat bed without using bedrails?: A Little ?Help needed moving to and from a bed to a chair (including a wheelchair)?: A Little ?Help needed standing up from a chair using your arms (e.g., wheelchair or bedside chair)?: A Little ?Help needed to walk in hospital room?: A Little ?Help needed climbing 3-5 steps with a railing? : A Little ?6 Click Score: 18 ? ?  ?End of Session Equipment Utilized During  Treatment: Gait belt ?Activity Tolerance: Patient tolerated treatment well ?Patient left: in chair;with call bell/phone within reach;with chair alarm set ?Nurse Communication: Mobility status ?PT Visit Diagnosis: Other abnormalities of gait and mobility (R26.89) ?  ? ? ?Time: 1100-1110 ?PT Time Calculation (min) (ACUTE ONLY): 10 min ? ?Charges:  $Gait Training: 8-22 mins          ?          ? ?Nuno Brubacher A. Dan Humphreys, PT, DPT ?Acute Rehabilitation Services ?Pager [phone] ?Office [phone] ? ? ? ?Katrece Roediger A Janyah Singleterry ?09/11/2021, 11:22 AM ? ?

## 2021-09-11 NOTE — Progress Notes (Signed)
Occupational Therapy Treatment Patient Details Name: [name] [name] MRN: [id number] DOB: 1928-07-06 Today's Date: 09/11/2021   History of present illness Pt is a 88 y/o female admitted 3/9 with episode of acute AMS.  MRI showed 25mm punctate infart in the right parietal region.  PMHx; vascular dementia, DM2, prior small CVA, HTN, OA.   OT comments     Recommendations for follow up therapy are one component of a multi-disciplinary discharge planning process, led by the attending physician.  Recommendations may be updated based on patient status, additional functional criteria and insurance authorization.    Follow Up Recommendations  Home health OT (PACE program?)    Assistance Recommended at Discharge Set up Supervision/Assistance  Patient can return home with the following  A little help with bathing/dressing/bathroom;Assistance with cooking/housework;Direct supervision/assist for medications management;Direct supervision/assist for financial management;Assist for transportation   Equipment Recommendations  None recommended by OT    Recommendations for Other Services      Precautions / Restrictions Precautions Precautions: Fall Restrictions Weight Bearing Restrictions: No       Mobility Bed Mobility               General bed mobility comments: in recliner on arrival    Transfers Overall transfer level: Needs assistance Equipment used: None Transfers: Sit to/from Stand Sit to Stand: Supervision           General transfer comment: cues for hand placement fo safety     Balance Overall balance assessment: Mild deficits observed, not formally tested                                         ADL either performed or assessed with clinical judgement   ADL                                         General ADL Comments: pt required min verbal and visual cues to problem solve to walk to bathroom to avoid obstacles, for  toileting tasks/hygiene and for oral care, grooming/hygiene standing at sink. Pt unsure how to control water temperature with min verbal and visual cues    Extremity/Trunk Assessment Upper Extremity Assessment Upper Extremity Assessment: Generalized weakness            Vision Baseline Vision/History: 1 Wears glasses Ability to See in Adequate Light: 0 Adequate Patient Visual Report: No change from baseline     Perception     Praxis      Cognition Arousal/Alertness: Awake/alert Behavior During Therapy: WFL for tasks assessed/performed Overall Cognitive Status: History of cognitive impairments - at baseline                                 General Comments: Pt with recent scans demonstrating dementia, pt was found wandering around outside, unsure of where she was or what she was doing. At this time, pt not oriented to place, time, or situation. Follows commands with no difficulties        Exercises      Shoulder Instructions       General Comments      Pertinent Vitals/ Pain       Pain Assessment Pain Assessment: No/denies pain Faces Pain Scale: No hurt  Home  Living                                          Prior Functioning/Environment              Frequency  Min 2X/week        Progress Toward Goals  OT Goals(current goals can now be found in the care plan section)  Progress towards OT goals: Progressing toward goals     Plan Discharge plan remains appropriate;Frequency remains appropriate    Co-evaluation                 AM-PAC OT "6 Clicks" Daily Activity     Outcome Measure   Help from another person eating meals?: None Help from another person taking care of personal grooming?: A Little Help from another person toileting, which includes using toliet, bedpan, or urinal?: A Little Help from another person bathing (including washing, rinsing, drying)?: A Little Help from another person to put on and  taking off regular upper body clothing?: A Little Help from another person to put on and taking off regular lower body clothing?: A Little 6 Click Score: 19    End of Session    OT Visit Diagnosis: Unsteadiness on feet (R26.81);Muscle weakness (generalized) (M62.81);Other symptoms and signs involving cognitive function   Activity Tolerance Patient tolerated treatment well   Patient Left in chair;with call bell/phone within reach;with chair alarm set   Nurse Communication          Time: [phone] OT Time Calculation (min): 23 min  Charges: OT General Charges $OT Visit: 1 Visit OT Treatments $Self Care/Home Management : 8-22 mins $Therapeutic Activity: 8-22 mins    Britt Bottom 09/11/2021, 11:29 AM  Occupational Therapy Treatment Patient Details Name: [name] [name] MRN: [id number] DOB: 1928/11/15 Today's Date: 09/11/2021   History of present illness Pt is a 88 y/o female admitted 3/9 with episode of acute AMS.  MRI showed 56mm punctate infart in the right parietal region.  PMHx; vascular dementia, DM2, prior small CVA, HTN, OA.   OT comments  Pt making good progress with functional goal. Pt very pleasant and cooperative, requires verbal and visual cues for problem solving and safety. OT will continue follow acutely to maximize level of function and safety   Recommendations for follow up therapy are one component of a multi-disciplinary discharge planning process, led by the attending physician.  Recommendations may be updated based on patient status, additional functional criteria and insurance authorization.    Follow Up Recommendations  Home health OT (PACE program?)    Assistance Recommended at Discharge Set up Supervision/Assistance  Patient can return home with the following  A little help with bathing/dressing/bathroom;Assistance with cooking/housework;Direct supervision/assist for medications management;Direct supervision/assist for financial  management;Assist for transportation   Equipment Recommendations  None recommended by OT    Recommendations for Other Services      Precautions / Restrictions Precautions Precautions: Fall Restrictions Weight Bearing Restrictions: No       Mobility Bed Mobility               General bed mobility comments: in recliner on arrival    Transfers Overall transfer level: Needs assistance Equipment used: None Transfers: Sit to/from Stand Sit to Stand: Supervision           General transfer comment: cues for hand placement fo safety  Balance Overall balance assessment: Mild deficits observed, not formally tested                                         ADL either performed or assessed with clinical judgement   ADL                                         General ADL Comments: pt required min verbal and visual cues to problem solve to walk to bathroom to avoid obstacles, for toileting tasks/hygiene and for oral care, grooming/hygiene standing at sink. Pt unsure how to control water temperature with min verbal and visual cues    Extremity/Trunk Assessment Upper Extremity Assessment Upper Extremity Assessment: Generalized weakness            Vision Baseline Vision/History: 1 Wears glasses Ability to See in Adequate Light: 0 Adequate Patient Visual Report: No change from baseline     Perception     Praxis      Cognition Arousal/Alertness: Awake/alert Behavior During Therapy: WFL for tasks assessed/performed Overall Cognitive Status: History of cognitive impairments - at baseline                                 General Comments: Pt with recent scans demonstrating dementia, pt was found wandering around outside, unsure of where she was or what she was doing. At this time, pt not oriented to place, time, or situation. Follows commands with no difficulties        Exercises      Shoulder Instructions        General Comments      Pertinent Vitals/ Pain       Pain Assessment Pain Assessment: No/denies pain Faces Pain Scale: No hurt  Home Living                                          Prior Functioning/Environment              Frequency  Min 2X/week        Progress Toward Goals  OT Goals(current goals can now be found in the care plan section)  Progress towards OT goals: Progressing toward goals     Plan Discharge plan remains appropriate;Frequency remains appropriate    Co-evaluation                 AM-PAC OT "6 Clicks" Daily Activity     Outcome Measure   Help from another person eating meals?: None Help from another person taking care of personal grooming?: A Little Help from another person toileting, which includes using toliet, bedpan, or urinal?: A Little Help from another person bathing (including washing, rinsing, drying)?: A Little Help from another person to put on and taking off regular upper body clothing?: A Little Help from another person to put on and taking off regular lower body clothing?: A Little 6 Click Score: 19    End of Session    OT Visit Diagnosis: Unsteadiness on feet (R26.81);Muscle weakness (generalized) (M62.81);Other symptoms and signs involving cognitive function   Activity Tolerance Patient tolerated treatment well   Patient  Left in chair;with call bell/phone within reach;with chair alarm set   Nurse Communication          Time: [id number] OT Time Calculation (min): 23 min  Charges: OT General Charges $OT Visit: 1 Visit OT Treatments $Self Care/Home Management : 8-22 mins $Therapeutic Activity: 8-22 mins    Britt Bottom 09/11/2021, 11:31 AM

## 2021-09-21 ENCOUNTER — Other Ambulatory Visit: Payer: Self-pay

## 2021-09-21 ENCOUNTER — Encounter: Payer: Self-pay | Admitting: Internal Medicine

## 2021-09-21 ENCOUNTER — Ambulatory Visit (INDEPENDENT_AMBULATORY_CARE_PROVIDER_SITE_OTHER): Payer: Medicare (Managed Care) | Admitting: Internal Medicine

## 2021-09-21 VITALS — BP 118/74 | HR 90 | Ht 63.0 in | Wt 156.4 lb

## 2021-09-21 DIAGNOSIS — E663 Overweight: Secondary | ICD-10-CM | POA: Diagnosis not present

## 2021-09-21 DIAGNOSIS — Z794 Long term (current) use of insulin: Secondary | ICD-10-CM

## 2021-09-21 DIAGNOSIS — E1121 Type 2 diabetes mellitus with diabetic nephropathy: Secondary | ICD-10-CM

## 2021-09-21 DIAGNOSIS — E7800 Pure hypercholesterolemia, unspecified: Secondary | ICD-10-CM

## 2021-09-21 MED ORDER — INSULIN NPH (HUMAN) (ISOPHANE) 100 UNIT/ML ~~LOC~~ SUSP: 32.0000 [IU] | Freq: Every day | SUBCUTANEOUS | 3 refills | Status: AC

## 2021-09-21 MED ORDER — NOVOLOG FLEXPEN 100 UNIT/ML ~~LOC~~ SOPN: PEN_INJECTOR | SUBCUTANEOUS | 3 refills | Status: AC

## 2021-09-21 NOTE — Patient Instructions (Addendum)
Please continue: ?- Trulicity 3 mg weekly ? ?Please increase: ?- NPH 32 units in am ? ?Please change NovoLog before meals: ?60-90: no NovoLog ?91-130: 5 units ?131-160: 6 units ?161-200: 7 units ?>200: 8 units ? ?If sugars higher than 300 consistently, call MD. ?If sugars lower than 70 consistently, call MD. ? ?Please return in 3-4 months. ?

## 2021-09-21 NOTE — Progress Notes (Addendum)
Patient ID: [name] [name], female   DOB: Oct 16, 1928, 88 y.o.   MRN: [id number]  ? ?This visit occurred during the SARS-CoV-2 public health emergency.  Safety protocols were in place, including screening questions prior to the visit, additional usage of staff PPE, and extensive cleaning of exam room while observing appropriate contact time as indicated for disinfecting solutions.  ? ?HPI: ?[name] [name] is a 88 y.o.-year-old female, returning for f/u for DM2, dx 2005, insulin-dependent, uncontrolled, with complications (nephropathy). Last visit 3 mo ago.   ?She is accompanied by her daughter who offers most of the medical information as patient is mostly nonverbal. ?She has [employer]. ? ?Interim history: ?No increased urination, blurry vision, nausea, chest pain.  No fever. ?She had COVID-19 in 06/2021. ?She wandered off from home before her hospitalization earlier this month.  They had to call the police to find her. ?She was just discharged from the hospital after being admitted on 09/09/2021 with altered mental status and found to have a small stroke.  She was discharged off NovoLog. In PACE program 2x a week: Tue and Thu: 10 am-3 pm.. ? ?Reviewed HbA1c levels: ?Lab Results  ?Component Value Date  ? HGBA1C 9.2 (H) 09/10/2021  ? HGBA1C 9.5 (A) 06/11/2021  ? HGBA1C 10.4 (A) 12/25/2020  ? HGBA1C 6.9 (A) 09/11/2020  ? HGBA1C 7.3 (A) 12/12/2019  ? HGBA1C 7.4 (A) 08/15/2019  ? HGBA1C 7.3 (A) 04/15/2019  ? HGBA1C 7.2 11/15/2018  ? HGBA1C 6.6 (A) 07/16/2018  ? HGBA1C 6.6 (A) 03/15/2018  ? ?Pt is on a regimen of: ?- Trulicity 1.5 >> 3 mg weekly  - unclear if taking consistently ?- NPH 12 >> 20 >> 24 >> 28 units in am  ?- NovoLog 6-8 >> 10-12 >>  8-10 units before meals >> stopped 09/2021 ?We had to stop Metformin 2/2 increased creatinine. ?Amaryl was stopped by PCP due to hypoglycemia 09/20/2020. ?Previously on [employer]. ? ?Pt.checks her sugars more than 4 times a day with her  CGM: ? ? ?Previously: ? ? ?Previously: ? ? ?Lowest sugar was 44 >> 175 >> 160 >> 70s >> 101. She has hypoglycemia awareness in the 70s. ?Highest 435 >> 400s >> 500s >> 409. ?In 2021, she had low blood sugar episodes, lowest being at 44, when she had a seizure.  She also had several falls.  She does have vascular dementia with behavioral disturbance with small subacute infarcts and chronic small vessel disease changes on MRI from 09/05/2020.  She does see neurology.   ? ?+ CKD stage III; last BUN/Cr: ?Lab Results  ?Component Value Date  ? BUN 18 09/11/2021  ? CREATININE 1.43 (H) 09/11/2021  ? ?+ HL; last set of lipids: ?Lab Results  ?Component Value Date  ? CHOL 175 09/10/2021  ? HDL 44 09/10/2021  ? [place] 97 09/10/2021  ? LDLDIRECT 106.0 06/11/2021  ? TRIG 168 (H) 09/10/2021  ? CHOLHDL 4.0 09/10/2021  ?On simvastatin 40. ? ?Pt's last eye exam was: 05/17/2021: No DR - Dr. Susa Loffler at [facility].  ? ?She denies numbness and tingling in her feet ? ?ROS: ?+ see HPI ? ?I reviewed pt's medications, allergies, PMH, social hx, family hx, and changes were documented in the history of present illness. Otherwise, unchanged from my initial visit note. ? ?Past Medical History:  ?Diagnosis Date  ? Asthma   ? DEGENERATIVE JOINT DISEASE, GENERALIZED 02/01/2007  ? Qualifier: Diagnosis of  By: Burnice Logan  MD, Doretha Sou   ?  DIARRHEA, CHRONIC 08/20/2009  ? Qualifier: Diagnosis of  By: Burnice Logan  MD, Doretha Sou   ? DM 02/01/2007  ? Qualifier: Diagnosis of  By: Floyde Parkins    ? FATIGUE 10/20/2008  ? Qualifier: Diagnosis of  By: Burnice Logan  MD, Doretha Sou   ? HYPERCHOLESTEROLEMIA 02/01/2007  ? Qualifier: Diagnosis of  By: Floyde Parkins    ? HYPERLIPIDEMIA 12/20/2007  ? Qualifier: Diagnosis of  By: Burnice Logan  MD, Doretha Sou   ? HYPERTENSION 02/01/2007  ? Qualifier: Diagnosis of  By: Floyde Parkins    ? LOW BACK PAIN 01/13/2010  ? Qualifier: Diagnosis of  By: Burnice Logan  MD, Doretha Sou   ? MUSCLE CRAMPS 04/06/2010  ? Qualifier:  Diagnosis of  By: Burnice Logan  MD, Doretha Sou   ? MYALGIA 01/20/2010  ? Qualifier: Diagnosis of  By: Elease Hashimoto MD, Bruce    ? OSTEOARTHRITIS 01/13/2010  ? Qualifier: Diagnosis of  By: Burnice Logan  MD, Doretha Sou   ? OVERACTIVE BLADDER 08/20/2009  ? Qualifier: Diagnosis of  By: Burnice Logan  MD, Doretha Sou   ? PLEURISY 06/18/2008  ? Qualifier: Diagnosis of  By: Niel Hummer MD, Lorinda Creed   ? PNEUMONIA 06/18/2008  ? Qualifier: Diagnosis of  By: Niel Hummer MD, Lorinda Creed   ? Rectal prolapse 09/20/2007  ? Qualifier: Diagnosis of  By: Burnice Logan  MD, Doretha Sou   ? VITAMIN B12 DEFICIENCY 07/20/2010  ? Qualifier: Diagnosis of  By: Carlean Purl MD, Dimas Millin   ? ?Past Surgical History:  ?Procedure Laterality Date  ? ABDOMINAL HYSTERECTOMY    ? partial- states she has her ovaries  ? APPENDECTOMY    ? BLADDER SURGERY    ? CATARACT EXTRACTION Bilateral 2012  ? CHOLECYSTECTOMY    ? ?Social History  ? ?Socioeconomic History  ? Marital status: Single  ?  Spouse name: Not on file  ? Number of children: Not on file  ? Years of education: Not on file  ? Highest education level: Not on file  ?Occupational History  ? Not on file  ?Tobacco Use  ? Smoking status: Former  ?  Packs/day: 10.00  ?  Types: Cigarettes  ? Smokeless tobacco: Never  ? Tobacco comments:  ?  quit > 59 yo   ?Vaping Use  ? Vaping Use: Never used  ?Substance and Sexual Activity  ? Alcohol use: No  ? Drug use: No  ? Sexual activity: Not on file  ?Other Topics Concern  ? Not on file  ?Social History Narrative  ? 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   ? Married for 21 years, widowed for 22 years   ? Right handed   ? Lives alone  ? ?Social Determinants of Health  ? ?Financial Resource Strain: Low Risk   ? Difficulty of Paying Living Expenses: Not hard at all  ?Food Insecurity: No Food Insecurity  ? Worried About Charity fundraiser in the Last Year: Never true  ? Ran Out of Food in the Last Year: Never true  ?Transportation Needs: No Transportation Needs  ? Lack of Transportation (Medical): No  ? Lack of  Transportation (Non-Medical): No  ?Physical Activity: Inactive  ? Days of Exercise per Week: 0 days  ? Minutes of Exercise per Session: 0 min  ?Stress: No Stress Concern Present  ? Feeling of Stress : Not at all  ?Social Connections: Moderately Integrated  ? Frequency of Communication with Friends and Family: More than three times a week  ? Frequency of Social Gatherings  with Friends and Family: More than three times a week  ? Attends Religious Services: 1 to 4 times per year  ? Active Member of Clubs or Organizations: Yes  ? Attends Archivist Meetings: 1 to 4 times per year  ? Marital Status: Widowed  ?Intimate Partner Violence: Not At Risk  ? Fear of Current or Ex-Partner: No  ? Emotionally Abused: No  ? Physically Abused: No  ? Sexually Abused: No  ? ?Current Outpatient Medications on File Prior to Visit  ?Medication Sig Dispense Refill  ? Acetaminophen 650 MG TABS Take 1 tablet (650 mg total) by mouth 3 (three) times daily as needed. 30 tablet 0  ? aspirin 81 MG tablet Take 81 mg by mouth daily.    ? Blood Glucose Calibration (ACCU-CHEK GUIDE CONTROL) LIQD Use as directed 1 each 1  ? Blood Glucose Monitoring Suppl (ACCU-CHEK AVIVA PLUS) w/Device KIT Use to check blood sugar 3 times a day 1 kit 0  ? Blood Glucose Monitoring Suppl (ACCU-CHEK GUIDE) w/Device KIT Use as directed 1 kit 0  ? calcium-vitamin D (OSCAL WITH D 500-200) 500-200 MG-UNIT per tablet Take 1 tablet by mouth daily.    ? clopidogrel (PLAVIX) 75 MG tablet Take 1 tablet (75 mg total) by mouth daily. 19 tablet 0  ? Continuous Blood Gluc Sensor (FREESTYLE LIBRE 14 DAY SENSOR) MISC Use to check blood glucose 3 times a day 6 each 3  ? Dulaglutide (TRULICITY) 3 ZC/6.5MM SOPN Inject 3 mg into the skin once a week. 6 mL 3  ? fluticasone (FLONASE) 50 MCG/ACT nasal spray Place 1 spray into the nose daily. 16 g 0  ? Glucagon 3 MG/DOSE POWD Place 3 mg into the nose once as needed for up to 1 dose. 1 each 11  ? glucose blood (ACCU-CHEK AVIVA PLUS)  test strip Use to check blood sugar 3 times a day. 300 strip 12  ? glucose blood (ACCU-CHEK GUIDE) test strip CHECK BLOOD SUGAR THREE TIMES DAILY 100 strip 0  ? insulin NPH Human (NOVOLIN N) 100 UNIT/ML injection Inject 0.28 mLs (28 Units total

## 2021-09-29 IMAGING — DX DG LUMBAR SPINE COMPLETE 4+V
5 series · 5 of 5 positions shown · non-contrast
Comparison: Lumbar spine 05/17/2010, CT abdomen pelvis 04/09/2010

CLINICAL DATA: Fall 3 days prior, low lumbar back pain, rule out
compression fracture

EXAM:
LUMBAR SPINE - COMPLETE 4+ VIEW

[lumbar spine ap]
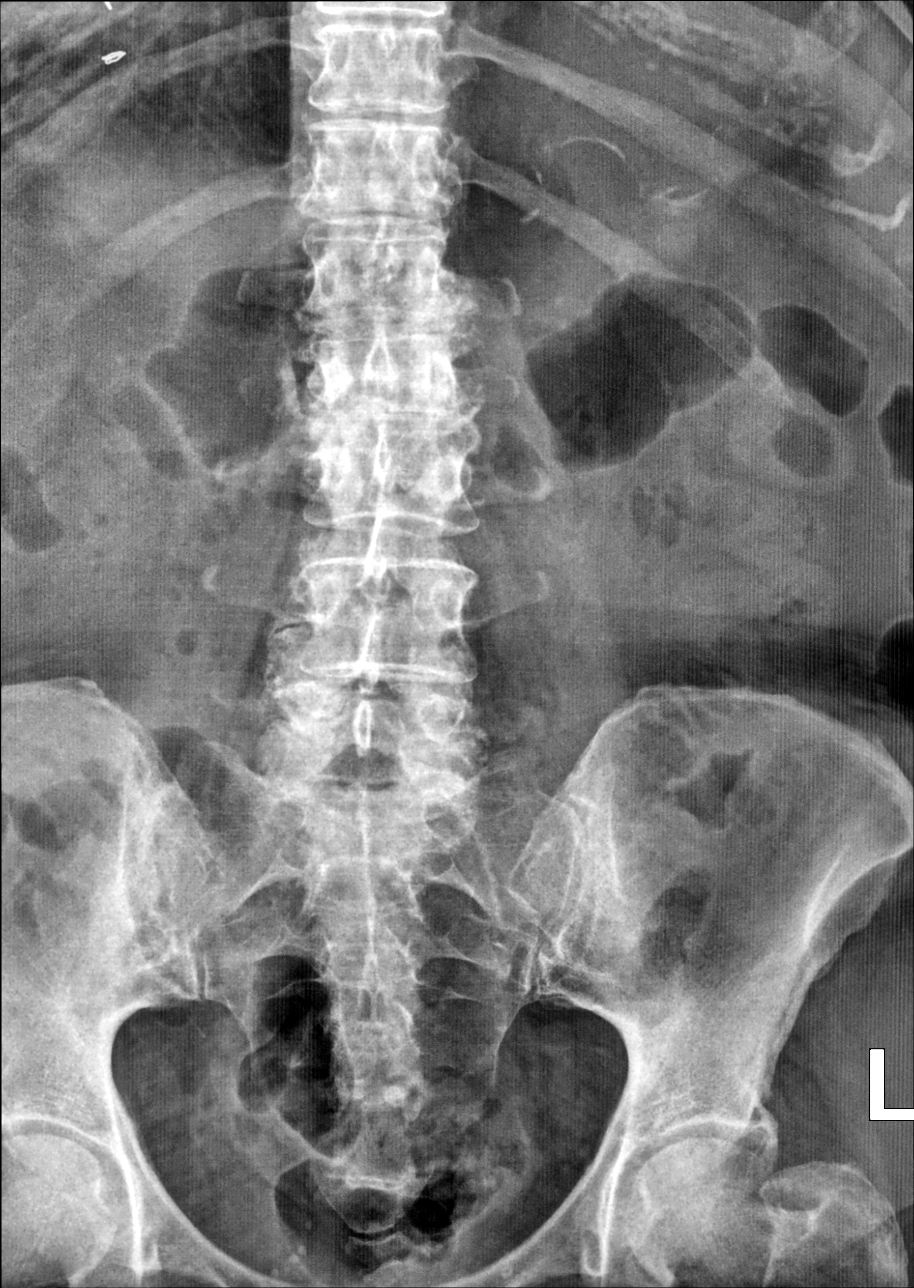

[lumbar spine oblique (1 of 2)]
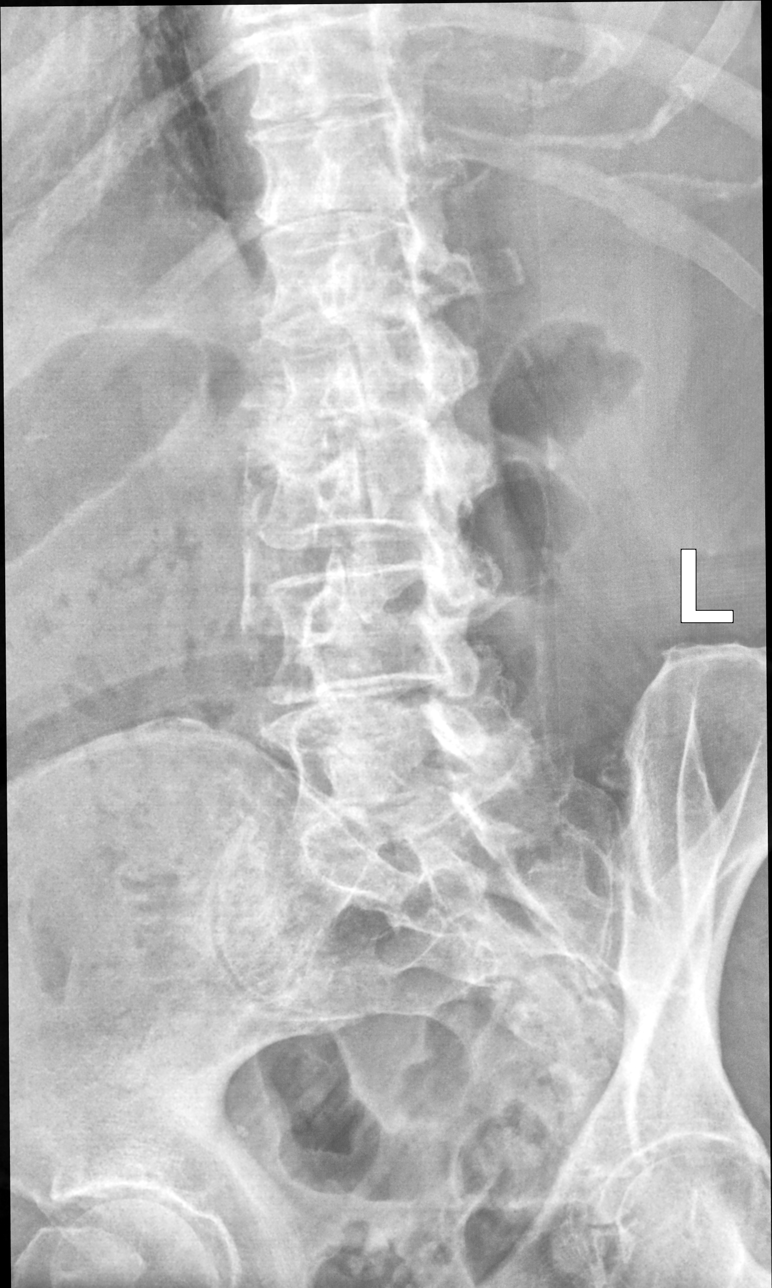

[lumbar spine oblique (2 of 2)]
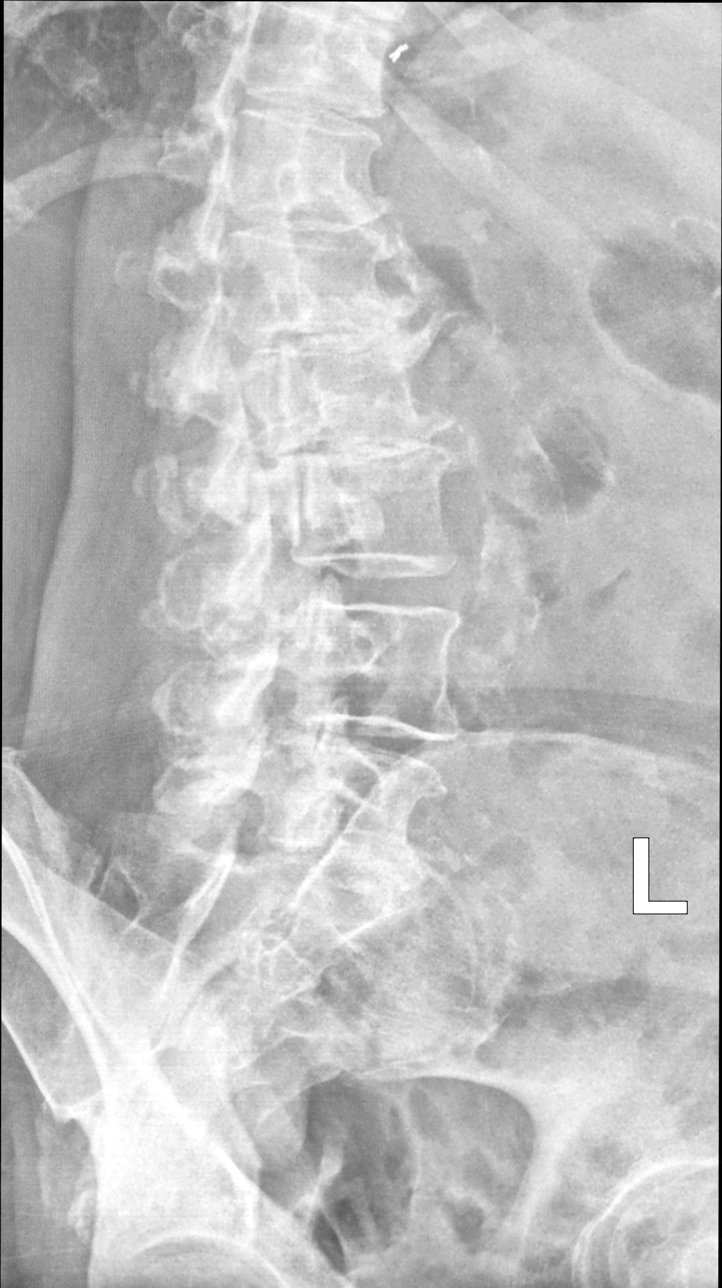

[lumbar spine lat]
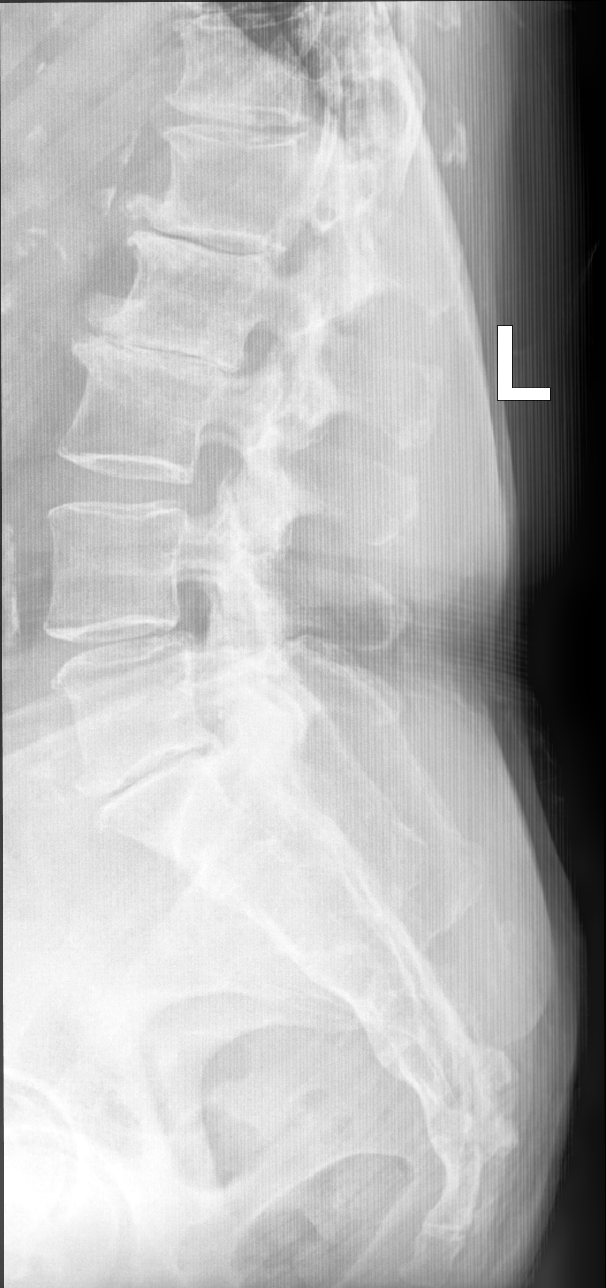

[lumbar spine lat spot]
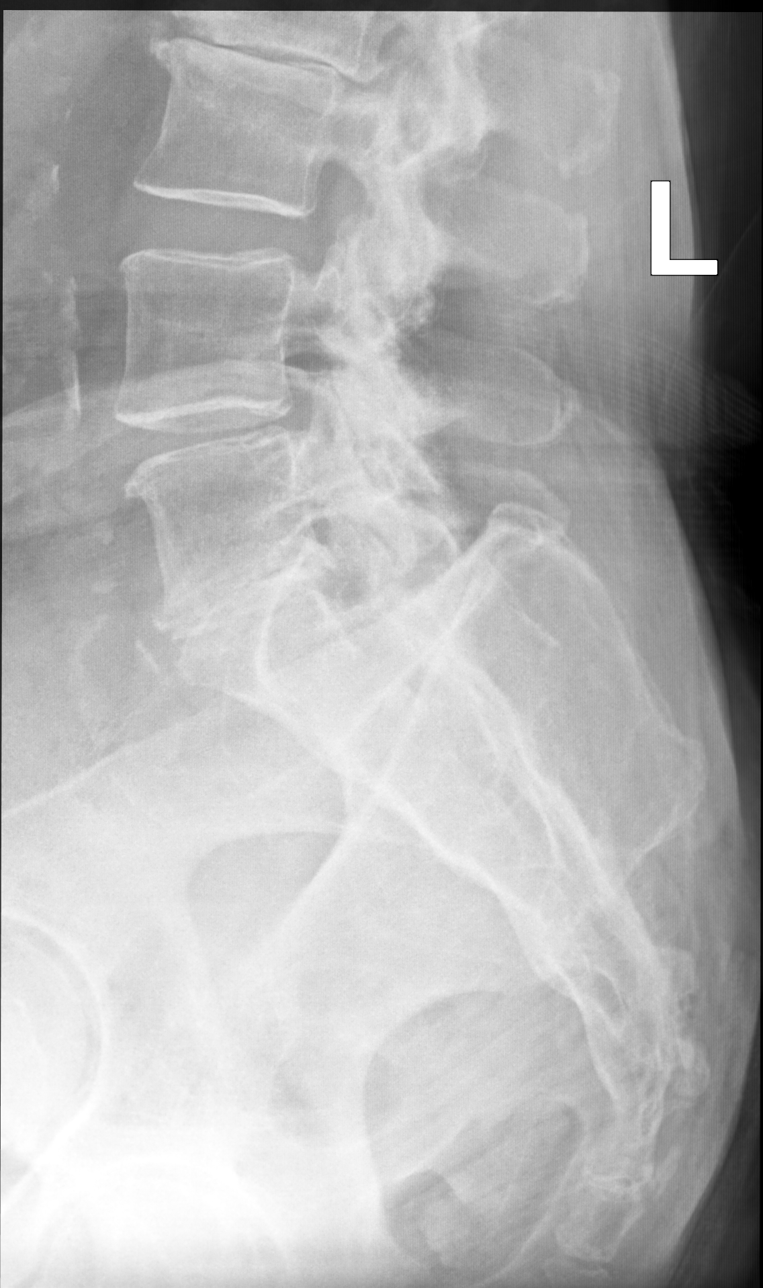

[5 of 5 positions shown; findings below may reference images not displayed]

FINDINGS: Preservation of the normal lumbar lordosis. Mild levocurvature, apex
L3. Grade 1 anterolisthesis L4 on L5, favored to be on a facet
degenerative basis without visible spondylolysis. No acute fracture
vertebral body height loss is seen. There has been progressive
intervertebral disc height loss throughout the lumbar levels, most
pronounced L1-L3 and L5-S1 with multilevel discogenic endplate
spurring and sclerotic changes as well as extensive facet
degenerative changes and hypertrophy most pronounced L5-S1. Some
chronic anterior wedging is noted at the T12 level which is
unchanged from comparison imaging. Included bones of the pelvis are
intact and congruent. Degenerative changes noted in the hips and SI
joints. Prior cholecystectomy. Normal bowel gas pattern. Vascular
calcifications in the upper abdomen. Remaining soft tissues are
unremarkable.
IMPRESSION: 1. No acute fracture or vertebral body height loss.
2. Chronic anterior wedging at T12 is unchanged from prior.
3. Grade 1 anterolisthesis L4 on L5, favored to be on a facet
degenerative basis without visible spondylolysis.
4. Progressive degenerative disc disease and facet degenerative
changes throughout the lumbar spine, most pronounced L1-L3 and
L5-S1.

## 2021-11-03 ENCOUNTER — Ambulatory Visit: Payer: Medicare (Managed Care) | Admitting: Physician Assistant

## 2021-11-03 NOTE — Progress Notes (Deleted)
Assessment/Plan:    Vascular dementia with behavioral disturbance  Recommendations:   Discussed safety both in and out of the home.  Recommend sitter and adult day programs to increase activity with close supervision Discussed the importance of regular daily schedule with inclusion of crossword puzzles to maintain brain function.  Continue to monitor mood by PCP Naps should be scheduled and should be no longer than 60 minutes and should not occur after 2 PM.  Mediterranean diet is recommended  Recommend close monitoring of her cardiovascular risk factors Follow up in 6 months.   Case discussed with Dr. Delice Lesch who agrees with the plan  recently enrolled in [facility]) admitted for evaluation of new behavior of elopement and long distance wandering (1 mile) together with change in mental status, found to have small vessel stroke manifested primarily as language disturbance (nonsensical words, some slurring episodic; she has hearing impairment and this may mimic receptive aphasia - she hears best if she can also read lips).  No obvious facial or limb weakness, no dysphagia.  No toxic, metabolic, infectious, or traumatic etiologies identified for her symptoms thus far, though it remains unclear if the stroke alone could explain her symptoms.  Medications are being reviewed carefully.   Subjective:   [name] [name] is a 88 y.o. RH female with a history of DM2 with secondary nephropathy, chronic diarrhea, vitamin B12 deficiency, osteoarthritis, overactive bladder, hypertension, seen today in follow up for memory loss.  She was initially seen on 09/09/2020 at which time MoCA was 16/30. MRI without contrast on 09/05/2020 showed 2 subcentimeter areas of weakly restricted diffusion in the left centrum semiovale and right for mental white matter due to small vessel infarcts in the setting of extensive chronic microvascular disease.  It was suspected that dementia was likely due to extensive  chronic microvascular disease.  This patient is accompanied in the office by her who supplements the history.  Previous records as well as any outside records available were reviewed prior to todays visit.  No medications are indicated, as the side effects likely outweigh the potential benefits, which the patient and her son agreed with.  In today's visit, she reports that her memory is "coming and going ", she has "good and bad days and on a bad day, I can forget everything ".  For example, she may have difficulties starting the TV, or trying to figure out how to answer the phone.  She denies repeating the same sentences or asking the same questions.  She denies coming into her room and forgetting what she went there for.  She reports that her short-term is better than her long-term memory.  She denies leaving objects in unusual places.  Most of the day, she spends it at home, with her son visiting her, and bringing her food as the patient does not cook anymore.  He is also in charge of the medications and the driving.  She admits that before she would do a lot more, including crossword puzzles, or word finding, which she has not been doing lately.  Her son states that she sleeps more than before, sometimes up to noon.  When asked about it, she states that she does not have much to do, and that she is bored, and that is when she sleeps that long.  She denies sleepwalking, or REM behavior.  She denies hallucinations or paranoia.  She has been somewhat depressed, and takes Effexor with good response.  For sleeping she takes  trazodone as per PCP.  No hygiene concerns, she is independent of bathing and dressing.  Her son takes care of the finances.  Her appetite is good, denies trouble swallowing.  She ambulates at times with a cane, unless she forgets it when she goes with her son.  She does not use a walker or a cane inside the house.  She denies any falls, or head injuries. Denies headaches, double vision,  dizziness, focal numbness or tingling, unilateral weakness or tremors or anosmia. No history of seizures.  She has a history of urine incontinence, and uses diapers.  She has intermittent diarrhea.   HISTORY OF PRESENT ILLNESS 10/19/20: This is an 88 year old right-handed woman with a history of hyperlipidemia, hypertension, diabetes, osteoarthritis, overactive bladder, B12 deficiency, chronic diarrhea, anxiety, presenting for evaluation of memory impairment. She was also having frequent falls. She fell in December, slipping on wet leaves, hitting her head on the ground. She fell in February getting out of bed and hitting her face on the floor. Since the falls, family noticed decline in cognitive function. She will forget she is making food on the stove and walk away. She would not remember calling family members. She had continued to drive and denied getting lost. MMSE 20/30 at PCP office in 08/2020. She had a brain MRI without contrast on 09/05/20 which showed 2 subcentimeter areas of weakly restricted diffusion in the left centrum semiovale and right frontal white matter best attributed to small vessel infarcts in the setting of extensive chronic microvascular disease.    She feels her memory is okay. She lives alone. Her son has taken over bill payments, she tries to write them but had missed payments and double paid some. She forget her medications, her other son fixes her medications but she forgets what she has taken or what she is supposed to take. She denies leaving the stove on but family notes she would leave food out on the stove and would be there for a couple of days (not new). [name] started noticing memory changes around December, they could tell her memory had decreased but she would make light of it. She has always been one who "if she does not want to do it, she would not do it." Family would tried to call her to remind her about her medications but she would not answer the phone. Her other son  checks her glucose levels but she does not eat like she is supposed to, she like sweets. Glucose levels are labile, yesterday it was 44, then it was 240 this morning. Family had her stop driving 2 days ago, as far as they know she has not gotten lost but there have been a few dings on the car lately. She is independent with dressing and bathing. Family reports she lays all her laundry on the bed, which is not new. Since December, family also noticed more personality changes, she would do or say things out of character, no paranoia or hallucinations. [name] thinks she has some depression, he can see a mood change. For instance, her grandchildren visited but she did not carry on conversations and spent more time with the dog. She is moody when she does not feel good, and does not feel good a lot of times saying she is tired. She had a UTI a couple of years ago and had delirium.    She denies any headaches, dizziness, diplopia, dysarthria/dysphagia, neck/back pain, focal numbness/tingling/weakness, anosmia, or tremors. She has bowel incontinence and  has started wearing adult diapers. She has always had loose stools but today had an accident in the building just prior to her appointment. She has more control with urination. She does not sleep well, she is on Trazodone and takes naps. She had a fall last month getting out of bed, then another fall last Saturday, She now has a cane and walker.     PREVIOUS MEDICATIONS:   CURRENT MEDICATIONS:  Outpatient Encounter Medications as of 11/03/2021  Medication Sig   Acetaminophen 650 MG TABS Take 1 tablet (650 mg total) by mouth 3 (three) times daily as needed.   aspirin 81 MG tablet Take 81 mg by mouth daily.   Blood Glucose Calibration (ACCU-CHEK GUIDE CONTROL) LIQD Use as directed   Blood Glucose Monitoring Suppl (ACCU-CHEK AVIVA PLUS) w/Device KIT Use to check blood sugar 3 times a day   Blood Glucose Monitoring Suppl (ACCU-CHEK GUIDE) w/Device KIT Use as directed    calcium-vitamin D (OSCAL WITH D 500-200) 500-200 MG-UNIT per tablet Take 1 tablet by mouth daily.   clopidogrel (PLAVIX) 75 MG tablet Take 1 tablet (75 mg total) by mouth daily.   Continuous Blood Gluc Sensor (FREESTYLE LIBRE 14 DAY SENSOR) MISC Use to check blood glucose 3 times a day   Dulaglutide (TRULICITY) 3 EY/8.1KG SOPN Inject 3 mg into the skin once a week.   fluticasone (FLONASE) 50 MCG/ACT nasal spray Place 1 spray into the nose daily.   Glucagon 3 MG/DOSE POWD Place 3 mg into the nose once as needed for up to 1 dose.   glucose blood (ACCU-CHEK AVIVA PLUS) test strip Use to check blood sugar 3 times a day.   glucose blood (ACCU-CHEK GUIDE) test strip CHECK BLOOD SUGAR THREE TIMES DAILY   insulin NPH Human (NOVOLIN N) 100 UNIT/ML injection Inject 0.32 mLs (32 Units total) into the skin daily before breakfast. Pens please   Insulin Pen Needle (BD PEN NEEDLE NANO U/F) 32G X 4 MM MISC USE TO INJECT INSULIN 4 TIMES DAILY AS DIRECTED.   Lancets (ONETOUCH ULTRASOFT) lancets Use as instructed to test 3 times daily   Multiple Vitamin (MULTIVITAMIN) tablet Take 1 tablet by mouth daily.   NOVOLOG FLEXPEN 100 UNIT/ML FlexPen Inject under skin 5-8 units 3x a day before meals   simvastatin (ZOCOR) 40 MG tablet TAKE 1 TABLET EVERY DAY (Patient taking differently: Take 40 mg by mouth daily at 6 PM.)   No facility-administered encounter medications on file as of 11/03/2021.     Objective:     PHYSICAL EXAMINATION:    VITALS:   There were no vitals filed for this visit.   GEN:  The patient appears stated age and is in NAD. HEENT:  Normocephalic, atraumatic.   Neurological examination:  General: NAD, well-groomed, appears stated age.  Flat affect Orientation: The patient is alert. Oriented to person, place and not to date. Cranial nerves: There is good facial symmetry.The speech is fluent and clear. No aphasia or dysarthria. Fund of knowledge is appropriate. Recent and remote memory are  impaired. Attention and concentration are reduced.  Able to name objects and repeat phrases.  Hearing is intact to conversational tone.    Sensation: Sensation is intact to light touch throughout Motor: Strength is at least antigravity x4. Tremors: none  DTR's `1/4 in UE/LE        09/09/2020    1:00 PM  Montreal Cognitive Assessment   Visuospatial/ Executive (0/5) 2  Naming (0/3) 2  Attention: Read list of  digits (0/2) 2  Attention: Read list of letters (0/1) 0  Attention: Serial 7 subtraction starting at 100 (0/3) 1  Language: Repeat phrase (0/2) 1  Language : Fluency (0/1) 0  Abstraction (0/2) 2  Delayed Recall (0/5) 0  Orientation (0/6) 5  Total 15  Adjusted Score (based on education) 16      08/21/2020   11:22 AM  MMSE - Mini Mental State Exam  Orientation to time 3  Orientation to Place 4  Registration 3  Attention/ Calculation 2  Recall 0  Language- name 2 objects 2  Language- repeat 1  Language- follow 3 step command 3  Language- read & follow direction 1  Write a sentence 1  Copy design 0  Total score 20        View : No data to display.             Movement examination: Tone: There is normal tone in the UE/LE Abnormal movements:  no tremor.  No myoclonus.  No asterixis.   Coordination:  There is no decremation with RAM's. Normal finger to nose  Gait and Station: The patient has no difficulty arising out of a deep-seated chair without the use of the hands. The patient's stride length is good.  Gait is cautious and narrow.     Total time spent on today's visit was minutes, including both face-to-face time and nonface-to-face time. Time included that spent on review of records (prior notes available to me/labs/imaging if pertinent), discussing treatment and goals, answering patient's questions and coordinating care.  Cc:  Dorothyann Peng, NP Sharene Butters, PA-C

## 2021-12-22 ENCOUNTER — Encounter: Payer: Self-pay | Admitting: Physician Assistant

## 2021-12-22 ENCOUNTER — Ambulatory Visit (INDEPENDENT_AMBULATORY_CARE_PROVIDER_SITE_OTHER): Payer: Medicare (Managed Care) | Admitting: Physician Assistant

## 2021-12-22 VITALS — BP 115/69 | HR 97 | Resp 18 | Ht 62.0 in | Wt 159.0 lb

## 2021-12-22 DIAGNOSIS — F01518 Vascular dementia, unspecified severity, with other behavioral disturbance: Secondary | ICD-10-CM | POA: Diagnosis not present

## 2021-12-22 MED ORDER — MEMANTINE HCL 5 MG PO TABS: 5.0000 mg | ORAL_TABLET | Freq: Every day | ORAL | 11 refills | Status: AC

## 2021-12-22 NOTE — Patient Instructions (Addendum)
It was a pleasure to see you today at our office.   Recommendations:  Follow up in  6 months Start Memantine 5mg  tablets.  Take 1 tablet at bedtime for 2 weeks, then 1 tablet twice daily.   Side effects include dizziness, headache, diarrhea or constipation.  Call if severe symptoms appear   Whom to call:  Memory  decline, memory medications: Call our office [phone]   For psychiatric meds, mood meds: Please have your primary care physician manage these medications.   Counseling regarding caregiver distress, including caregiver depression, anxiety and issues regarding community resources, adult day care programs, adult living facilities, or memory care questions:   Feel free to contact [place], Social Worker at [phone]   For assessment of decision of mental capacity and competency:  Call Dr. Anthoney Harada, geriatric psychiatrist at [phone]  For guidance in geriatric dementia issues please call [facility] [phone]     If you have any severe symptoms of a stroke, or other severe issues such as confusion,severe chills or fever, etc call 911 or go to the ER as you may need to be evaluated further   Feel free to visit Facebook page " [email or web address]" for tips of how to care for people with memory problems.       RECOMMENDATIONS FOR ALL PATIENTS WITH MEMORY PROBLEMS: 1. Continue to exercise (Recommend 30 minutes of walking everyday, or 3 hours every week) 2. Increase social interactions - continue going to [employer] and enjoy social gatherings with friends and family 3. Eat healthy, avoid fried foods and eat more fruits and vegetables 4. Maintain adequate blood pressure, blood sugar, and blood cholesterol level. Reducing the risk of stroke and cardiovascular disease also helps promoting better memory. 5. Avoid stressful situations. Live a simple life and avoid aggravations. Organize your time and prepare for the next day in anticipation. 6. Sleep well,  avoid any interruptions of sleep and avoid any distractions in the bedroom that may interfere with adequate sleep quality 7. Avoid sugar, avoid sweets as there is a strong link between excessive sugar intake, diabetes, and cognitive impairment We discussed the Mediterranean diet, which has been shown to help patients reduce the risk of progressive memory disorders and reduces cardiovascular risk. This includes eating fish, eat fruits and green leafy vegetables, nuts like almonds and hazelnuts, walnuts, and also use olive oil. Avoid fast foods and fried foods as much as possible. Avoid sweets and sugar as sugar use has been linked to worsening of memory function.  There is always a concern of gradual progression of memory problems. If this is the case, then we may need to adjust level of care according to patient needs. Support, both to the patient and caregiver, should then be put into place.    FALL PRECAUTIONS: Be cautious when walking. Scan the area for obstacles that may increase the risk of trips and falls. When getting up in the mornings, sit up at the edge of the bed for a few minutes before getting out of bed. Consider elevating the bed at the head end to avoid drop of blood pressure when getting up. Walk always in a well-lit room (use night lights in the walls). Avoid area rugs or power cords from appliances in the middle of the walkways. Use a walker or a cane if necessary and consider physical therapy for balance exercise. Get your eyesight checked regularly.    ABILITY TO BE LEFT ALONE: If patient is unable to contact 911  operator, consider using [facility], or when the need is there, arrange for someone to stay with patients. Smoking is a fire hazard, consider supervision or cessation. Risk of wandering should be assessed by caregiver and if detected at any point, supervision and safe proof recommendations should be instituted.  MEDICATION SUPERVISION: Inability to self-administer medication  needs to be constantly addressed. Implement a mechanism to ensure safe administration of the medications.     [facility] [phone] or [phone]

## 2021-12-22 NOTE — Progress Notes (Unsigned)
Assessment/Plan:   Dementia likely due to    Recommendations:    Start  10 mg   Side effects were discussed Follow up in   months. Continue ASA, statin and  BP meds for good cardiovascular and stroke preventiom   Case discussed with Dr. Delice Lesch who agrees with the plan   No family at the bedside. Some garbled speech. Denies pain, hard of hearing. No visual field deficit. Able to add fingers. Baseline vascular dementia and is a [facility] patient.  She was found wandering by family who do check on her daily, this is the first time she has left home like this.  The patient herself was not able to tell us what happened. female PMHx as noted above on home aspirin 81 mg daily presented to the ED encephalopathic found to have small punctate focus of increased diffusion signal in the right parietal white matter. EEG to evaluate for seizure  Altered mental status.  Patient resides at home by herself.  Family members check on her.  According to the EMS report the patient fell sometime on Tuesday.  Last night the family noticed the patient was starting to become somewhat more agitated and confused.  Today patient apparently wandered off from the house for about 2 hours until she was found.  Patient has never done this before in the past.  Patient went to the primary care doctor's office and was sent there to the ED.  Notes from the primary care doctor's office reviewed.  Includes information about past medical history and demographics but no specific information about today.  Patient herself is not able to answer any questions about what specifically happened recently.  She denies any specific complaints right now but is having some difficulty communicating."  Subjective:    [name] [name] is a very pleasant 88 y.o. RH female  seen today in follow up for memory loss. This patient is accompanied in the office by her who supplements the history.  Previous records as well as any outside records available  were reviewed prior to todays visit.  Patient was last seen at our office on  at which time her  Patient is currently on   Any changes in memory since last visit? Memory may be better, she remebers what she ate, she has answered the phone without any issues she can differentiate betweed Tm and real call  Patient lives with:  alone, caretaker 1 a week and her children vist her often  who noticed changes as well.  Patient lives alone repeats oneself? Not really  Disoriented when walking into a room?  Patient denies   Leaving objects in unusual places?  Patient denies   Ambulates  with difficulty?   Patient denies Ellamae Sia now and then I walk outside the house  Recent falls?  Patient denies   Any head injuries?  Patient denies   History of seizures?   Patient denies   Wandering behavior?  Patient denies   Patient drives?   Patient no longer drives  Patient drives with assistance  Patient uses [employer] to drive   Any mood changes such irritability agitation?  Patient denies   Any history of depression?:  Patient denies   Hallucinations?  Patient denies   Paranoia?  Patient denies   Patient reports that he sleeps well without vivid dreams, REM behavior or sleepwalking   Patient reports vivid dreams   History of sleep apnea?  Patient denies   Any hygiene concerns?  Patient denies  Independent of bathing and dressing?  Endorsed  Does the patient needs help with medications? Son monitors  Who is in charge of the finances? Other son and her daughter  Any changes in appetite?  Finicky eater  Patient have trouble swallowing? Patient denies   Does the patient cook?  Son cooks for her    Any kitchen accidents such as leaving the stove on? Patient denies   Any headaches?  Patient denies   The double vision? Patient denies   Any focal numbness or tingling?  Patient denies   Chronic back pain Patient denies   Unilateral weakness?  Patient denies   Any tremors?  Patient denies   Any history of anosmia?   Patient denies   Any incontinence of urine?uses diapers, has "accidents " Any bowel dysfunction?  Occasional diarrhea   PREVIOUS MEDICATIONS:   CURRENT MEDICATIONS:  Outpatient Encounter Medications as of 12/22/2021  Medication Sig   Acetaminophen 650 MG TABS Take 1 tablet (650 mg total) by mouth 3 (three) times daily as needed.   aspirin 81 MG tablet Take 81 mg by mouth daily.   Blood Glucose Calibration (ACCU-CHEK GUIDE CONTROL) LIQD Use as directed   Blood Glucose Monitoring Suppl (ACCU-CHEK AVIVA PLUS) w/Device KIT Use to check blood sugar 3 times a day   Blood Glucose Monitoring Suppl (ACCU-CHEK GUIDE) w/Device KIT Use as directed   calcium-vitamin D (OSCAL WITH D 500-200) 500-200 MG-UNIT per tablet Take 1 tablet by mouth daily.   clopidogrel (PLAVIX) 75 MG tablet Take 1 tablet (75 mg total) by mouth daily.   Continuous Blood Gluc Sensor (FREESTYLE LIBRE 14 DAY SENSOR) MISC Use to check blood glucose 3 times a day   Dulaglutide (TRULICITY) 3 JQ/4.9EE SOPN Inject 3 mg into the skin once a week.   fluticasone (FLONASE) 50 MCG/ACT nasal spray Place 1 spray into the nose daily.   Glucagon 3 MG/DOSE POWD Place 3 mg into the nose once as needed for up to 1 dose.   glucose blood (ACCU-CHEK AVIVA PLUS) test strip Use to check blood sugar 3 times a day.   glucose blood (ACCU-CHEK GUIDE) test strip CHECK BLOOD SUGAR THREE TIMES DAILY   insulin NPH Human (NOVOLIN N) 100 UNIT/ML injection Inject 0.32 mLs (32 Units total) into the skin daily before breakfast. Pens please   Insulin Pen Needle (BD PEN NEEDLE NANO U/F) 32G X 4 MM MISC USE TO INJECT INSULIN 4 TIMES DAILY AS DIRECTED.   Lancets (ONETOUCH ULTRASOFT) lancets Use as instructed to test 3 times daily   Multiple Vitamin (MULTIVITAMIN) tablet Take 1 tablet by mouth daily.   NOVOLOG FLEXPEN 100 UNIT/ML FlexPen Inject under skin 5-8 units 3x a day before meals   simvastatin (ZOCOR) 40 MG tablet TAKE 1 TABLET EVERY DAY (Patient taking  differently: Take 40 mg by mouth daily at 6 PM.)   No facility-administered encounter medications on file as of 12/22/2021.       08/21/2020   11:22 AM  MMSE - Mini Mental State Exam  Orientation to time 3  Orientation to Place 4  Registration 3  Attention/ Calculation 2  Recall 0  Language- name 2 objects 2  Language- repeat 1  Language- follow 3 step command 3  Language- read & follow direction 1  Write a sentence 1  Copy design 0  Total score 20      09/09/2020    1:00 PM  Montreal Cognitive Assessment   Visuospatial/ Executive (0/5) 2  Naming (0/3)  2  Attention: Read list of digits (0/2) 2  Attention: Read list of letters (0/1) 0  Attention: Serial 7 subtraction starting at 100 (0/3) 1  Language: Repeat phrase (0/2) 1  Language : Fluency (0/1) 0  Abstraction (0/2) 2  Delayed Recall (0/5) 0  Orientation (0/6) 5  Total 15  Adjusted Score (based on education) 16    Objective:     PHYSICAL EXAMINATION:    VITALS:  There were no vitals filed for this visit.  GEN:  The patient appears stated age and is in NAD. HEENT:  Normocephalic, atraumatic.   Neurological examination:  General: NAD, well-groomed, appears stated age. Orientation: The patient is alert. Oriented to person, place and date Cranial nerves: There is good facial symmetry.The speech is fluent and clear. No aphasia or dysarthria. Fund of knowledge is appropriate. Recent and remote memory are impaired. Attention and concentration are reduced.  Able to name objects and repeat phrases.  Hearing is intact to conversational tone.    Sensation: Sensation is intact to light touch throughout Motor: Strength is at least antigravity x4. Tremors: none  DTR's 2/4 in UE/LE     Movement examination: Tone: There is normal tone in the UE/LE Abnormal movements:  no tremor.  No myoclonus.  No asterixis.   Coordination:  There is no decremation with RAM's. Normal finger to nose  Gait and Station: The patient has no  difficulty arising out of a deep-seated chair without the use of the hands. The patient's stride length is good.  Gait is cautious and narrow.    Thank you for allowing [place] the opportunity to participate in the care of this nice patient. Please do not hesitate to contact us for any questions or concerns.   Total time spent on today's visit was *** minutes dedicated to this patient today, preparing to see patient, examining the patient, ordering tests and/or medications and counseling the patient, documenting clinical information in the EHR or other health record, independently interpreting results and communicating results to the patient/family, discussing treatment and goals, answering patient's questions and coordinating care.  Cc:  Dorothyann Peng, NP  Sharene Butters 12/22/2021 12:49 PM

## 2021-12-23 ENCOUNTER — Ambulatory Visit: Payer: Medicare HMO | Admitting: Physician Assistant

## 2021-12-24 DIAGNOSIS — F01518 Vascular dementia, unspecified severity, with other behavioral disturbance: Secondary | ICD-10-CM | POA: Insufficient documentation

## 2022-01-27 ENCOUNTER — Ambulatory Visit: Payer: Medicare (Managed Care) | Admitting: Internal Medicine

## 2022-02-03 ENCOUNTER — Ambulatory Visit (INDEPENDENT_AMBULATORY_CARE_PROVIDER_SITE_OTHER): Payer: Medicare (Managed Care) | Admitting: Internal Medicine

## 2022-02-03 ENCOUNTER — Encounter: Payer: Self-pay | Admitting: Internal Medicine

## 2022-02-03 VITALS — BP 128/78 | HR 77 | Ht 62.0 in | Wt 160.2 lb

## 2022-02-03 DIAGNOSIS — Z794 Long term (current) use of insulin: Secondary | ICD-10-CM

## 2022-02-03 DIAGNOSIS — E7800 Pure hypercholesterolemia, unspecified: Secondary | ICD-10-CM

## 2022-02-03 DIAGNOSIS — E1121 Type 2 diabetes mellitus with diabetic nephropathy: Secondary | ICD-10-CM

## 2022-02-03 DIAGNOSIS — E663 Overweight: Secondary | ICD-10-CM | POA: Diagnosis not present

## 2022-02-03 LAB — POCT GLYCOSYLATED HEMOGLOBIN (HGB A1C): Hemoglobin A1C: 7.8 % — AB (ref 4.0–5.6)

## 2022-02-03 NOTE — Patient Instructions (Addendum)
Please continue: - Trulicity 3 mg weekly - NPH 32 units in am  Change: - NovoLog 15 min before meals: 60-90: no NovoLog 91-130: 7 units 131-160: 8 units 161-200: 9 units >200: 10 units  If you get the insulin injection after dinner, take only 5 units.  If sugars higher than 300 consistently, call MD. If sugars lower than 70 consistently, call MD.  Please return in 3-4 months.

## 2022-02-03 NOTE — Progress Notes (Signed)
Patient ID: [name] [name], female   DOB: 03/09/1929, 88 y.o.   MRN: [id number]   HPI: [name] [name] is a 88 y.o.-year-old female, returning for f/u for DM2, dx 2005, insulin-dependent, uncontrolled, with complications (nephropathy). Last visit 3 mo ago.   She is accompanied by her son who offers most of the medical information as patient is mostly nonverbal. She has Brewing technologist.  Interim history: No increased urination, blurry vision, nausea, chest pain.   She continues in [facility] program twice a week: MWF 10 AM to 3 PM.  Reviewed HbA1c levels: Lab Results  Component Value Date   HGBA1C 9.2 (H) 09/10/2021   HGBA1C 9.5 (A) 06/11/2021   HGBA1C 10.4 (A) 12/25/2020   HGBA1C 6.9 (A) 09/11/2020   HGBA1C 7.3 (A) 12/12/2019   HGBA1C 7.4 (A) 08/15/2019   HGBA1C 7.3 (A) 04/15/2019   HGBA1C 7.2 11/15/2018   HGBA1C 6.6 (A) 07/16/2018   HGBA1C 6.6 (A) 03/15/2018   Pt is on a regimen of: - Trulicity 1.5 >> 3 mg weekly - NPH 12 >> 20 >> 24 >> 28 >> 32 units in am  - NovoLog 6-8 >> 10-12 >>  8-10 units before meals >> stopped 09/2021 >> restarted: 60-90: no NovoLog 91-130: 5 units 131-160: 6 units 161-200: 7 units >200: 8 units We had to stop Metformin 2/2 increased creatinine. Amaryl was stopped by PCP due to hypoglycemia 09/29/2020. Previously on [employer].  Pt.checks her sugars more than 4 times a day with her CGM (with receiver):   Previously:   Previously:   Lowest sugar was 44 >> .Marland Kitchen. 101>> 72. She has hypoglycemia awareness in the 70s. Highest 435 >> 400s >> 500s >> 409 >> 350s. In 2021, she had low blood sugar episodes, lowest being at 44, when she had a seizure.  She also had several falls.  She does have vascular dementia with behavioral disturbance with small subacute infarcts and chronic small vessel disease changes on MRI from 09/05/2020.  She does see neurology.    + CKD stage III; last BUN/Cr: Lab Results  Component Value Date   BUN 18 09/11/2021   CREATININE  1.43 (H) 09/11/2021   + HL; last set of lipids: Lab Results  Component Value Date   CHOL 175 09/10/2021   HDL 44 09/10/2021   LDLCALC 97 09/10/2021   LDLDIRECT 106.0 06/11/2021   TRIG 168 (H) 09/10/2021   CHOLHDL 4.0 09/10/2021  On simvastatin 40.  Pt's last eye exam was: 05/17/2021: No DR - Dr. Susa Loffler at [facility].   She denies numbness and tingling in her feet.  ROS: + see HPI  I reviewed pt's medications, allergies, PMH, social hx, family hx, and changes were documented in the history of present illness. Otherwise, unchanged from my initial visit note.  Past Medical History:  Diagnosis Date   Asthma    DEGENERATIVE JOINT DISEASE, GENERALIZED 02/01/2007   Qualifier: Diagnosis of  By: Burnice Logan  MD, Doretha Sou    DIARRHEA, CHRONIC 08/20/2009   Qualifier: Diagnosis of  By: Burnice Logan  MD, Doretha Sou    DM 02/01/2007   Qualifier: Diagnosis of  By: Floyde Parkins     FATIGUE 10/20/2008   Qualifier: Diagnosis of  By: Burnice Logan  MD, Doretha Sou    HYPERCHOLESTEROLEMIA 02/01/2007   Qualifier: Diagnosis of  By: Mariam Dollar 12/20/2007   Qualifier: Diagnosis of  By: Burnice Logan  MD, Doretha Sou    HYPERTENSION 02/01/2007  Qualifier: Diagnosis of  By: Russella Dar BACK PAIN 01/13/2010   Qualifier: Diagnosis of  By: Burnice Logan  MD, Berry CRAMPS 04/06/2010   Qualifier: Diagnosis of  By: Burnice Logan  MD, Doretha Sou    MYALGIA 01/20/2010   Qualifier: Diagnosis of  By: Elease Hashimoto MD, Bruce     OSTEOARTHRITIS 01/13/2010   Qualifier: Diagnosis of  By: Burnice Logan  MD, Doretha Sou    OVERACTIVE BLADDER 08/20/2009   Qualifier: Diagnosis of  By: Burnice Logan  MD, Doretha Sou    PLEURISY 06/18/2008   Qualifier: Diagnosis of  By: Niel Hummer MD, Lorinda Creed    PNEUMONIA 06/18/2008   Qualifier: Diagnosis of  By: Niel Hummer MD, Lorinda Creed    Rectal prolapse 09/20/2007   Qualifier: Diagnosis of  By: Burnice Logan  MD, Doretha Sou    VITAMIN B12 DEFICIENCY  07/20/2010   Qualifier: Diagnosis of  By: Carlean Purl MD, Dimas Millin    Past Surgical History:  Procedure Laterality Date   ABDOMINAL HYSTERECTOMY     partial- states she has her ovaries   APPENDECTOMY     BLADDER SURGERY     CATARACT EXTRACTION Bilateral 2012   CHOLECYSTECTOMY     Social History   Socioeconomic History   Marital status: Single    Spouse name: Not on file   Number of children: Not on file   Years of education: Not on file   Highest education level: Not on file  Occupational History   Not on file  Tobacco Use   Smoking status: Former    Packs/day: 10.00    Types: Cigarettes   Smokeless tobacco: Never   Tobacco comments:    quit > 29 yo   Vaping Use   Vaping Use: Never used  Substance and Sexual Activity   Alcohol use: No   Drug use: No   Sexual activity: Not on file  Other Topics Concern   Not on file  Social History Narrative   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    Married for 5 years, widowed for 22 years    Right handed    Lives alone   Social Determinants of Health   Financial Resource Strain: Low Risk  (08/09/2021)   Overall Financial Resource Strain (CARDIA)    Difficulty of Paying Living Expenses: Not hard at all  Food Insecurity: No Food Insecurity (08/09/2021)   Hunger Vital Sign    Worried About Running Out of Food in the Last Year: Never true    [place] in the Last Year: Never true  Transportation Needs: No Transportation Needs (08/09/2021)   PRAPARE - Transportation    Lack of Transportation (Medical): No    Lack of Transportation (Non-Medical): No  Physical Activity: Inactive (08/09/2021)   Exercise Vital Sign    Days of Exercise per Week: 0 days    Minutes of Exercise per Session: 0 min  Stress: No Stress Concern Present (08/09/2021)   [place]    Feeling of Stress : Not at all  Social Connections: Moderately Integrated (08/09/2021)   Social Connection and Isolation Panel [NHANES]     Frequency of Communication with Friends and Family: More than three times a week    Frequency of Social Gatherings with Friends and Family: More than three times a week    Attends Religious Services: 1 to 4 times per year    Active Member of [employer]  or Organizations: Yes    Attends Archivist Meetings: 1 to 4 times per year    Marital Status: Widowed  Intimate Partner Violence: Not At Risk (08/09/2021)   Humiliation, Afraid, Rape, and Kick questionnaire    Fear of Current or Ex-Partner: No    Emotionally Abused: No    Physically Abused: No    Sexually Abused: No   Current Outpatient Medications on File Prior to Visit  Medication Sig Dispense Refill   Acetaminophen 650 MG TABS Take 1 tablet (650 mg total) by mouth 3 (three) times daily as needed. 30 tablet 0   aspirin 81 MG tablet Take 81 mg by mouth daily.     Blood Glucose Calibration (ACCU-CHEK GUIDE CONTROL) LIQD Use as directed 1 each 1   Blood Glucose Monitoring Suppl (ACCU-CHEK AVIVA PLUS) w/Device KIT Use to check blood sugar 3 times a day 1 kit 0   Blood Glucose Monitoring Suppl (ACCU-CHEK GUIDE) w/Device KIT Use as directed 1 kit 0   calcium-vitamin D (OSCAL WITH D 500-200) 500-200 MG-UNIT per tablet Take 1 tablet by mouth daily.     clopidogrel (PLAVIX) 75 MG tablet Take 1 tablet (75 mg total) by mouth daily. 19 tablet 0   Continuous Blood Gluc Sensor (FREESTYLE LIBRE 14 DAY SENSOR) MISC Use to check blood glucose 3 times a day 6 each 3   Dulaglutide (TRULICITY) 3 BO/1.7PZ SOPN Inject 3 mg into the skin once a week. 6 mL 3   fluticasone (FLONASE) 50 MCG/ACT nasal spray Place 1 spray into the nose daily. 16 g 0   Glucagon 3 MG/DOSE POWD Place 3 mg into the nose once as needed for up to 1 dose. 1 each 11   glucose blood (ACCU-CHEK AVIVA PLUS) test strip Use to check blood sugar 3 times a day. 300 strip 12   glucose blood (ACCU-CHEK GUIDE) test strip CHECK BLOOD SUGAR THREE TIMES DAILY 100 strip 0   insulin NPH Human  (NOVOLIN N) 100 UNIT/ML injection Inject 0.32 mLs (32 Units total) into the skin daily before breakfast. Pens please 30 mL 3   Insulin Pen Needle (BD PEN NEEDLE NANO U/F) 32G X 4 MM MISC USE TO INJECT INSULIN 4 TIMES DAILY AS DIRECTED. 200 each 3   Lancets (ONETOUCH ULTRASOFT) lancets Use as instructed to test 3 times daily 300 each 5   memantine (NAMENDA) 5 MG tablet Take 1 tablet (5 mg total) by mouth daily. 30 tablet 11   Multiple Vitamin (MULTIVITAMIN) tablet Take 1 tablet by mouth daily.     NOVOLOG FLEXPEN 100 UNIT/ML FlexPen Inject under skin 5-8 units 3x a day before meals 40 mL 3   simvastatin (ZOCOR) 40 MG tablet TAKE 1 TABLET EVERY DAY (Patient taking differently: Take 40 mg by mouth daily at 6 PM.) 90 tablet 2   No current facility-administered medications on file prior to visit.   Allergies  Allergen Reactions   Metformin Diarrhea   Sitagliptin Phosphate Other (See Comments)    abdominal pain   Family History  Problem Relation Age of Onset   Deep vein thrombosis Mother    COPD Father    Asthma Father    Prostate cancer Brother    Heart attack Brother    Heart attack Maternal Grandfather    Diabetes Neg Hx    Heart disease Neg Hx    Stroke Neg Hx    PE: BP 128/78 (BP Location: Left Arm, Patient Position: Sitting, Cuff Size: Normal)  Pulse 77   Ht 5' 2"  (1.575 m)   Wt 160 lb 3.2 oz (72.7 kg)   SpO2 94%   BMI 29.30 kg/m   Wt Readings from Last 3 Encounters:  02/03/22 160 lb 3.2 oz (72.7 kg)  12/22/21 159 lb (72.1 kg)  09/21/21 156 lb 6.4 oz (70.9 kg)   Constitutional: overweight, in NAD Eyes: no exophthalmos ENT: moist mucous membranes, no masses palpated in neck, no cervical lymphadenopathy Cardiovascular: RRR, No MRG Respiratory: CTA B Musculoskeletal: no deformities Skin: moist, warm, no rashes Neurological: no tremor with outstretched hands Diabetic Foot Exam - Simple   Simple Foot Form Diabetic Foot exam was performed with the following findings: Yes  02/03/2022 11:32 AM  Visual Inspection No deformities, no ulcerations, no other skin breakdown bilaterally: Yes Sensation Testing Intact to touch and monofilament testing bilaterally: Yes Pulse Check Posterior Tibialis and Dorsalis pulse intact bilaterally: Yes Comments    ASSESSMENT: 1. DM2, insulin-dependent, uncontrolled, with complications -Severe hypoglycemia complicated by seizure - + nephropathy  We have tried to stop Amaryl, but sugars were higher >> we restarted it >> stopped again 09/07/2020.  2. HL  3.  Overweight  PLAN:  1. Patient with longstanding, uncontrolled, insulin-dependent diabetes, previously on basal/bolus insulin regimen and weekly GLP-1 receptor agonist, with her diabetes control impaired by vascular dementia and forgetting insulin doses.  Before last visit, she was admitted with altered mental status and found to have a small stroke in 09/2021.  While in the hospital, she was taken off NovoLog (but it was mentioned that she was not taking this consistently before admission).  At last visit, reviewing her CGM trends, it appears that her sugars were fluctuating around 180 limit of the target range overnight and they were increasing gradually to 5 AM and peaking around 1 PM.  She had another hyperglycemic peak after dinner.  I advised her to increase the NPH in the morning, continue Trulicity and gave her new instructions for NovoLog. CGM interpretation: -At today's visit, we reviewed her CGM downloads: It appears that 43% of values are in target range (goal >70%), while 57% are higher than 180 (goal <25%), and 0% are lower than 70 (goal <4%).  The calculated average blood sugar is 198.  The projected HbA1c for the next 3 months (GMI) is 8%. -Reviewing the CGM trends, it appears that her sugars decrease overnight, sometimes close to 70 and then increase after every meal, particularly so after lunch and dinner.  Upon questioning, her son is giving her NPH in the morning,  and he tries to give her the NovoLog before meals, but many times he only manages to give it to her after the meals.  Patient is alone in the morning and he is not sure whether she is taking the insulin before this meal.  At this visit, we discussed about the fact that she absolutely needs to take NovoLog before the meal, ideally 15 minutes prior to eating and he will try to set Alexa to remind her to take it.  It does look like she needs higher doses of insulin so I did advise them to increase the doses, however, if they have to inject after meals, to take a smaller amount, to avoid hypoglycemia.  Otherwise, for now we can continue NPH and Trulicity doses. - I advised her to:   Patient Instructions  Please continue: - Trulicity 3 mg weekly - NPH 32 units in am  Change: - NovoLog 15 min before meals: 60-90:  no NovoLog 91-130: 7 units 131-160: 8 units 161-200: 9 units >200: 10 units  If you get the insulin injection after dinner, take only 5 units.  If sugars higher than 300 consistently, call MD. If sugars lower than 70 consistently, call MD.  Please return in 3-4 months.  - we checked her HbA1c: 7.8% (lower) - advised to check sugars at different times of the day - 4x a day, rotating check times - advised for yearly eye exams >> she is UTD - return to clinic in 3-4 months    2. HL -Reviewed latest lipid panel from 09/2021: LDL above target, triglycerides slightly high: Lab Results  Component Value Date   CHOL 175 09/10/2021   HDL 44 09/10/2021   LDLCALC 97 09/10/2021   LDLDIRECT 106.0 06/11/2021   TRIG 168 (H) 09/10/2021   CHOLHDL 4.0 09/10/2021  -She continues on simvastatin 40 mg daily without side effects  3.  Overweight -We will continue Trulicity which should also help with weight loss -Weight was stable at last visit.  Since then, she gained 4 pounds.  Philemon Kingdom, MD PhD [facility] Endocrinology

## 2022-05-31 ENCOUNTER — Other Ambulatory Visit: Payer: Self-pay | Admitting: Family Medicine

## 2022-05-31 DIAGNOSIS — N1832 Chronic kidney disease, stage 3b: Secondary | ICD-10-CM

## 2022-05-31 DIAGNOSIS — E1122 Type 2 diabetes mellitus with diabetic chronic kidney disease: Secondary | ICD-10-CM

## 2022-05-31 DIAGNOSIS — E1151 Type 2 diabetes mellitus with diabetic peripheral angiopathy without gangrene: Secondary | ICD-10-CM

## 2022-06-06 ENCOUNTER — Ambulatory Visit (INDEPENDENT_AMBULATORY_CARE_PROVIDER_SITE_OTHER): Payer: Medicare (Managed Care) | Admitting: Internal Medicine

## 2022-06-06 ENCOUNTER — Encounter: Payer: Self-pay | Admitting: Internal Medicine

## 2022-06-06 VITALS — BP 128/78 | HR 73 | Ht 62.0 in | Wt 161.4 lb

## 2022-06-06 DIAGNOSIS — E7800 Pure hypercholesterolemia, unspecified: Secondary | ICD-10-CM | POA: Diagnosis not present

## 2022-06-06 DIAGNOSIS — E663 Overweight: Secondary | ICD-10-CM

## 2022-06-06 DIAGNOSIS — Z794 Long term (current) use of insulin: Secondary | ICD-10-CM

## 2022-06-06 DIAGNOSIS — E1121 Type 2 diabetes mellitus with diabetic nephropathy: Secondary | ICD-10-CM

## 2022-06-06 LAB — POCT GLYCOSYLATED HEMOGLOBIN (HGB A1C): Hemoglobin A1C: 7.4 % — AB (ref 4.0–5.6)

## 2022-06-06 NOTE — Patient Instructions (Addendum)
Please continue: - Trulicity 3 mg weekly - NPH 32 units in am - NovoLog 15 min before meals: 60-90: no NovoLog 91-130: 7 units 131-160: 8 units 161-200: 9 units >200: 10 units If you get the insulin injection after dinner, take only 5 units.  If sugars higher than 300 consistently, call MD. If sugars lower than 70 consistently, call MD.  Please return in 4 months.

## 2022-06-06 NOTE — Progress Notes (Signed)
Patient ID: [name] [name], female   DOB: October 09, 1928, 88 y.o.   MRN: [id number]   HPI: [name] [name] is a 88 y.o.-year-old female, returning for f/u for DM2, dx 2005, insulin-dependent, uncontrolled, with complications (nephropathy). Last visit 4 mo ago.   She is accompanied by her son who offers most of the medical information as patient is mostly nonverbal. She has Brewing technologist.  Interim history: No increased urination, blurry vision, nausea, chest pain.  However, she complains of feeling tired and overall feeling poorly. She continues in [facility] program 3x a week: MWF 10 AM to 3 PM. She has a UTI >> on ABx, Tylenol ES.  She will have an ultrasound tomorrow.  Reviewed HbA1c levels: Lab Results  Component Value Date   HGBA1C 7.8 (A) 02/03/2022   HGBA1C 9.2 (H) 09/10/2021   HGBA1C 9.5 (A) 06/11/2021   HGBA1C 10.4 (A) 12/25/2020   HGBA1C 6.9 (A) 09/11/2020   HGBA1C 7.3 (A) 12/12/2019   HGBA1C 7.4 (A) 08/15/2019   HGBA1C 7.3 (A) 04/15/2019   HGBA1C 7.2 11/15/2018   HGBA1C 6.6 (A) 07/16/2018   Pt is on a regimen of: - Trulicity 1.5 >> 3 mg weekly - NPH 12 >> 20 >> 24 >> 28 >> 32 units in am  - NovoLog 6-8 >> 10-12 >>  8-10 units before meals >> stopped 09/2021 >> restarted: 60-90: no NovoLog 91-130: 7 units 131-160: 8 units 161-200: 9 units >200: 10 units If you get the insulin injection after dinner, take only 5 units. We had to stop Metformin 2/2 increased creatinine. Amaryl was stopped by PCP due to hypoglycemia 09/11/2020. Previously on [employer].  Pt.checks her sugars more than 4 times a day with her CGM (with receiver):  Previously:   Previously:   Lowest sugar was 44 >> .Marland Kitchen. 101 >> 72 >> 59. She has hypoglycemia awareness in the 70s. Highest 500s >> 409 >> 350s >> >350. In 2021, she had low blood sugar episodes, lowest being at 44, when she had a seizure.  She also had several falls.  She does have vascular dementia with behavioral disturbance with small  subacute infarcts and chronic small vessel disease changes on MRI from 09/05/2020.  She does see neurology.    + CKD stage III; last BUN/Cr: Lab Results  Component Value Date   BUN 18 09/11/2021   CREATININE 1.43 (H) 09/11/2021   + HL; last set of lipids: Lab Results  Component Value Date   CHOL 175 09/10/2021   HDL 44 09/10/2021   LDLCALC 97 09/10/2021   LDLDIRECT 106.0 06/11/2021   TRIG 168 (H) 09/10/2021   CHOLHDL 4.0 09/10/2021  On simvastatin 40.  Pt's last eye exam was: 05/17/2021: No DR - Dr. Susa [name] at [facility]. Coming up this mo.  She denies numbness and tingling in her feet.  Last foot exam 02/03/2022.  ROS: + see HPI  I reviewed pt's medications, allergies, PMH, social hx, family hx, and changes were documented in the history of present illness. Otherwise, unchanged from my initial visit note.  Past Medical History:  Diagnosis Date   Asthma    DEGENERATIVE JOINT DISEASE, GENERALIZED 02/01/2007   Qualifier: Diagnosis of  By: Burnice Logan  MD, Doretha Sou    DIARRHEA, CHRONIC 08/20/2009   Qualifier: Diagnosis of  By: Burnice Logan  MD, Doretha Sou    DM 02/01/2007   Qualifier: Diagnosis of  By: Floyde Parkins     FATIGUE 10/20/2008   Qualifier: Diagnosis of  By: Burnice Logan  MD, Doretha Sou    HYPERCHOLESTEROLEMIA 02/01/2007   Qualifier: Diagnosis of  By: Mariam Dollar 12/20/2007   Qualifier: Diagnosis of  By: Burnice Logan  MD, Doretha Sou    HYPERTENSION 02/01/2007   Qualifier: Diagnosis of  By: Floyde Parkins     LOW BACK PAIN 01/13/2010   Qualifier: Diagnosis of  By: Burnice Logan  MD, Doretha Sou    MUSCLE CRAMPS 04/06/2010   Qualifier: Diagnosis of  By: Burnice Logan  MD, Doretha Sou    MYALGIA 01/20/2010   Qualifier: Diagnosis of  By: Elease Hashimoto MD, Bruce     OSTEOARTHRITIS 01/13/2010   Qualifier: Diagnosis of  By: Burnice Logan  MD, Doretha Sou    OVERACTIVE BLADDER 08/20/2009   Qualifier: Diagnosis of  By: Burnice Logan  MD, Doretha Sou    PLEURISY 06/18/2008    Qualifier: Diagnosis of  By: Niel Hummer MD, Lorinda Creed    PNEUMONIA 06/18/2008   Qualifier: Diagnosis of  By: Niel Hummer MD, [name] [name]    Rectal prolapse 09/20/2007   Qualifier: Diagnosis of  By: Burnice Logan  MD, Doretha Sou    VITAMIN B12 DEFICIENCY 07/20/2010   Qualifier: Diagnosis of  By: Carlean Purl MD, Dimas Millin    Past Surgical History:  Procedure Laterality Date   ABDOMINAL HYSTERECTOMY     partial- states she has her ovaries   APPENDECTOMY     BLADDER SURGERY     CATARACT EXTRACTION Bilateral 2012   CHOLECYSTECTOMY     Social History   Socioeconomic History   Marital status: Single    Spouse name: Not on file   Number of children: Not on file   Years of education: Not on file   Highest education level: Not on file  Occupational History   Not on file  Tobacco Use   Smoking status: Former    Packs/day: 10.00    Types: Cigarettes   Smokeless tobacco: Never   Tobacco comments:    quit > 18 yo   Vaping Use   Vaping Use: Never used  Substance and Sexual Activity   Alcohol use: No   Drug use: No   Sexual activity: Not on file  Other Topics Concern   Not on file  Social History Narrative   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    Married for 64 years, widowed for 22 years    Right handed    Lives alone   Social Determinants of Health   Financial Resource Strain: Low Risk  (08/09/2021)   Overall Financial Resource Strain (CARDIA)    Difficulty of Paying Living Expenses: Not hard at all  Food Insecurity: No Food Insecurity (08/09/2021)   Hunger Vital Sign    Worried About Running Out of Food in the Last Year: Never true    [place] in the Last Year: Never true  Transportation Needs: No Transportation Needs (08/09/2021)   PRAPARE - Transportation    Lack of Transportation (Medical): No    Lack of Transportation (Non-Medical): No  Physical Activity: Inactive (08/09/2021)   Exercise Vital Sign    Days of Exercise per Week: 0 days    Minutes of Exercise per Session: 0 min  Stress: No  Stress Concern Present (08/09/2021)   [place]    Feeling of Stress : Not at all  Social Connections: Moderately Integrated (08/09/2021)   Social Connection and Isolation Panel [NHANES]    Frequency of Communication with  Friends and Family: More than three times a week    Frequency of Social Gatherings with Friends and Family: More than three times a week    Attends Religious Services: 1 to 4 times per year    Active Member of [employer] or Organizations: Yes    Attends Archivist Meetings: 1 to 4 times per year    Marital Status: Widowed  Intimate Partner Violence: Not At Risk (08/09/2021)   Humiliation, Afraid, Rape, and Kick questionnaire    Fear of Current or Ex-Partner: No    Emotionally Abused: No    Physically Abused: No    Sexually Abused: No   Current Outpatient Medications on File Prior to Visit  Medication Sig Dispense Refill   Acetaminophen 650 MG TABS Take 1 tablet (650 mg total) by mouth 3 (three) times daily as needed. 30 tablet 0   aspirin 81 MG tablet Take 81 mg by mouth daily.     Blood Glucose Calibration (ACCU-CHEK GUIDE CONTROL) LIQD Use as directed 1 each 1   Blood Glucose Monitoring Suppl (ACCU-CHEK AVIVA PLUS) w/Device KIT Use to check blood sugar 3 times a day 1 kit 0   Blood Glucose Monitoring Suppl (ACCU-CHEK GUIDE) w/Device KIT Use as directed 1 kit 0   calcium-vitamin D (OSCAL WITH D 500-200) 500-200 MG-UNIT per tablet Take 1 tablet by mouth daily.     clopidogrel (PLAVIX) 75 MG tablet Take 1 tablet (75 mg total) by mouth daily. 19 tablet 0   Continuous Blood Gluc Sensor (FREESTYLE LIBRE 14 DAY SENSOR) MISC Use to check blood glucose 3 times a day 6 each 3   Dulaglutide (TRULICITY) 3 BO/1.7PZ SOPN Inject 3 mg into the skin once a week. 6 mL 3   fluticasone (FLONASE) 50 MCG/ACT nasal spray Place 1 spray into the nose daily. 16 g 0   Glucagon 3 MG/DOSE POWD Place 3 mg into the nose once as  needed for up to 1 dose. 1 each 11   glucose blood (ACCU-CHEK AVIVA PLUS) test strip Use to check blood sugar 3 times a day. 300 strip 12   glucose blood (ACCU-CHEK GUIDE) test strip CHECK BLOOD SUGAR THREE TIMES DAILY 100 strip 0   insulin NPH Human (NOVOLIN N) 100 UNIT/ML injection Inject 0.32 mLs (32 Units total) into the skin daily before breakfast. Pens please 30 mL 3   Insulin Pen Needle (BD PEN NEEDLE NANO U/F) 32G X 4 MM MISC USE TO INJECT INSULIN 4 TIMES DAILY AS DIRECTED. 200 each 3   Lancets (ONETOUCH ULTRASOFT) lancets Use as instructed to test 3 times daily 300 each 5   memantine (NAMENDA) 5 MG tablet Take 1 tablet (5 mg total) by mouth daily. 30 tablet 11   Multiple Vitamin (MULTIVITAMIN) tablet Take 1 tablet by mouth daily.     NOVOLOG FLEXPEN 100 UNIT/ML FlexPen Inject under skin 5-8 units 3x a day before meals 40 mL 3   simvastatin (ZOCOR) 40 MG tablet TAKE 1 TABLET EVERY DAY (Patient taking differently: Take 40 mg by mouth daily at 6 PM.) 90 tablet 2   No current facility-administered medications on file prior to visit.   Allergies  Allergen Reactions   Metformin Diarrhea   Sitagliptin Phosphate Other (See Comments)    abdominal pain   Family History  Problem Relation Age of Onset   Deep vein thrombosis Mother    COPD Father    Asthma Father    Prostate cancer Brother    Heart attack  Brother    Heart attack Maternal Grandfather    Diabetes Neg Hx    Heart disease Neg Hx    Stroke Neg Hx    PE: BP 128/78 (BP Location: Left Arm, Patient Position: Sitting, Cuff Size: Normal)   Pulse 73   Ht _0  (1.575 m)   Wt 161 lb 6.4 oz (73.2 kg)   SpO2 98%   BMI 29.52 kg/m   Wt Readings from Last 3 Encounters:  06/06/22 161 lb 6.4 oz (73.2 kg)  02/03/22 160 lb 3.2 oz (72.7 kg)  12/22/21 159 lb (72.1 kg)   Constitutional: overweight, in NAD Eyes: no exophthalmos ENT: no masses palpated in neck, no cervical lymphadenopathy Cardiovascular: RRR, No MRG Respiratory: CTA  B Musculoskeletal: no deformities Skin: no rashes Neurological: no tremor with outstretched hands  ASSESSMENT: 1. DM2, insulin-dependent, uncontrolled, with complications -Severe hypoglycemia complicated by seizure - + nephropathy  We have tried to stop Amaryl, but sugars were higher >> we restarted it >> stopped again 09/03/2020.  2. HL  3.  Overweight  PLAN:  1. Patient with longstanding, uncontrolled, insulin-dependent diabetes, on a basal/bolus insulin regimen and weekly GLP-1 receptor agonist, with her diabetes control impaired by vascular dementia and forgetting insulin doses.  However, she is now going to the [facility] program 3 times a week and also has her son who gives her the insulin injections and the rest of the days and she is getting insulin and more consistently.  At last visit, HbA1c was better, at 7.8%. CGM interpretation: -At today's visit, we reviewed her CGM downloads: It appears that 69% of values are in target range (goal >70%), while 31% are higher than 180 (goal <25%), and 0% are lower than 70 (goal <4%).  The calculated average blood sugar is 163.  The projected HbA1c for the next 3 months (GMI) is 7.2%. -Reviewing the CGM trends, sugars appear to be improving overnight, fluctuating mostly in the target range, but they increase after the first meal of the day and then subsequently after the rest of the meals.  However, postprandial excursions are less pronounced as before.  For now, therefore, I did not suggest a change in regimen. - I advised her to:   Patient Instructions  Please continue: - Trulicity 3 mg weekly - NPH 32 units in am - NovoLog 15 min before meals: 60-90: no NovoLog 91-130: 7 units 131-160: 8 units 161-200: 9 units >200: 10 units If you get the insulin injection after dinner, take only 5 units.  If sugars higher than 300 consistently, call MD. If sugars lower than 70 consistently, call MD.  Please return in 4 months.  - we checked her HbA1c:  7.4% (lower) - advised to check sugars at different times of the day - 4x a day, rotating check times - advised for yearly eye exams >> she is UTD - return to clinic in 4 months    2. HL -Reviewed latest lipid panel from 09/2021: LDL above target, triglycerides slightly high, HDL at goal: Lab Results  Component Value Date   CHOL 175 09/10/2021   HDL 44 09/10/2021   LDLCALC 97 09/10/2021   LDLDIRECT 106.0 06/11/2021   TRIG 168 (H) 09/10/2021   CHOLHDL 4.0 09/10/2021  -She continues on simvastatin 40 mg daily without side effects  3.  Overweight -We will continue Trulicity which should also help with weight loss -She gained 4 pounds before last visit -She gained 1 pound since then  Philemon Kingdom, MD PhD  [facility]

## 2022-06-07 ENCOUNTER — Ambulatory Visit
Admission: RE | Admit: 2022-06-07 | Discharge: 2022-06-07 | Disposition: A | Discharge status: Home / Self Care | Payer: Medicare (Managed Care) | Source: Ambulatory Visit | Attending: Family Medicine | Admitting: Family Medicine

## 2022-06-07 DIAGNOSIS — N1832 Chronic kidney disease, stage 3b: Secondary | ICD-10-CM

## 2022-06-07 DIAGNOSIS — E1122 Type 2 diabetes mellitus with diabetic chronic kidney disease: Secondary | ICD-10-CM

## 2022-06-07 DIAGNOSIS — E1151 Type 2 diabetes mellitus with diabetic peripheral angiopathy without gangrene: Secondary | ICD-10-CM

## 2022-06-17 LAB — HM DIABETES EYE EXAM

## 2022-06-20 ENCOUNTER — Encounter: Payer: Self-pay | Admitting: Internal Medicine

## 2022-06-23 ENCOUNTER — Encounter: Payer: Self-pay | Admitting: Physician Assistant

## 2022-06-23 ENCOUNTER — Ambulatory Visit (INDEPENDENT_AMBULATORY_CARE_PROVIDER_SITE_OTHER): Payer: Medicare (Managed Care) | Admitting: Physician Assistant

## 2022-06-23 VITALS — BP 124/74 | HR 82 | Resp 18 | Ht 62.0 in | Wt 158.0 lb

## 2022-06-23 DIAGNOSIS — R413 Other amnesia: Secondary | ICD-10-CM

## 2022-06-23 NOTE — Progress Notes (Signed)
Assessment/Plan:   Memory Impairment   [name] [name] is a very pleasant 88 y.o. RH female  with a history of DM2 with secondary nephropathy, chronic diarrhea, vitamin B12 deficiency, osteoarthritis, overactive bladder, hypertension seen today in follow up for memory loss. Patient is currently on memantine 5 mg twice daily, tolerating well.  Prior MRI brain personally reviewed was remarkable for acute small punctate infarct in the periatrial white matter.  EEG was normal. MMSE today is 21/30, stable from prior evaluation       Follow up in 6  months. Continue memantine 5 mg twice daily Controlled mood by PCP, continue Effexor, and trazodone Continue to control cardiovascular risk factors, patient is on aspirin daily Continue adult day program at [facility]    Subjective:    This patient is accompanied in the office by her daughter who supplements the history.  Previous records as well as any outside records available were reviewed prior to todays visit.  She was last seen on 12/22/2021.  Latest MMSE was 20/30   Any changes in memory since last visit? "About the same. I remember things I want to remember ". She enjoys Geographical information systems officer. repeats oneself? Denies  Disoriented when walking into a room?  Patient denies   Leaving objects in unusual places?  Patient denies   Wandering behavior?  Patient denies   Any personality changes since last visit?  Patient denies   Any worsening depression?:  Patient denies   Hallucinations or paranoia?  Patient denies   Seizures?   Patient denies.  Latest EEG was normal.  Any sleep changes?  Denies vivid dreams, REM behavior or sleepwalking   Sleep apnea?  Patient denies   Any hygiene concerns?  "They get on me about that". She fights the children about hygiene. Independent of bathing and dressing?  Endorsed . Needs some assistance in the tub " The aid  will give her one if she lets her do it " Does the patient needs help with medications?  Son is in  charge, he prepares the daily meds, then he lays them for the afternoon.  [facility] monitors the pills as well. Cameras in the house have been  paced  to remind her an monitor her   Who is in charge of the finances?  Son and daughter in charge    Any changes in appetite?  Patient denies, but she is a finicky eater according to her family   Patient have trouble swallowing? Patient denies   Does the patient cook?  Any kitchen accidents such as leaving the stove on? Patient denies   Any headaches?  Patient denies   Chronic back pain Patient denies   Ambulates with difficulty?    She does not like using a walker for stability.  She attends [facility] MWF  Recent falls or head injuries?  Patient denies     Unilateral weakness, numbness or tingling?  Patient denies   Any tremors?  Patient denies   Any anosmia?  Patient denies   Any incontinence of urine?  Endorsed, she wears diapers patient denies   Any bowel dysfunction?  "She always had loose stools "     Patient lives  She has a caregiver and her children visit her often.  She also goes to adult day program 3 times a week Monday Wednesday Friday Does the patient drive?No longer drives   Initial visit 10/19/2020 this is an 88 year old right-handed woman with a history of hyperlipidemia, hypertension, diabetes, osteoarthritis,  overactive bladder, B12 deficiency, chronic diarrhea, anxiety, presenting for evaluation of memory impairment. She was also having frequent falls. She fell in December, slipping on wet leaves, hitting her head on the ground. She fell in February getting out of bed and hitting her face on the floor. Since the falls, family noticed decline in cognitive function. She will forget she is making food on the stove and walk away. She would not remember calling family members. She had continued to drive and denied getting lost. MMSE 20/30 at PCP office in 08/2020. She had a brain MRI without contrast on 09/05/20 which showed 2 subcentimeter areas of  weakly restricted diffusion in the left centrum semiovale and right frontal white matter best attributed to small vessel infarcts in the setting of extensive chronic microvascular disease.    She feels her memory is okay. She lives alone. Her son has taken over bill payments, she tries to write them but had missed payments and double paid some. She forget her medications, her other son fixes her medications but she forgets what she has taken or what she is supposed to take. She denies leaving the stove on but family notes she would leave food out on the stove and would be there for a couple of days (not new). [name] started noticing memory changes around December, they could tell her memory had decreased but she would make light of it. She has always been one who "if she does not want to do it, she would not do it." Family would tried to call her to remind her about her medications but she would not answer the phone. Her other son checks her glucose levels but she does not eat like she is supposed to, she like sweets. Glucose levels are labile, yesterday it was 44, then it was 240 this morning. Family had her stop driving 2 days ago, as far as they know she has not gotten lost but there have been a few dings on the car lately. She is independent with dressing and bathing. Family reports she lays all her laundry on the bed, which is not new. Since December, family also noticed more personality changes, she would do or say things out of character, no paranoia or hallucinations. [name] thinks she has some depression, he can see a mood change. For instance, her grandchildren visited but she did not carry on conversations and spent more time with the dog. She is moody when she does not feel good, and does not feel good a lot of times saying she is tired. She had a UTI a couple of years ago and had delirium.    She denies any headaches, dizziness, diplopia, dysarthria/dysphagia, neck/back pain, focal  numbness/tingling/weakness, anosmia, or tremors. She has bowel incontinence and has started wearing adult diapers. She has always had loose stools but today had an accident in the building just prior to her appointment. She has more control with urination. She does not sleep well, she is on Trazodone and takes naps. She had a fall last month getting out of bed, then another fall last Saturday, She now has a cane and walker.      MRI brain 09/09/21 . Technically limited exam due to motion artifact and the patient's inability to tolerate the full length of the study. 2. Punctate 4 mm focus of diffusion abnormality involving the right periatrial white matter, likely a small acute small vessel type infarct. No associated mass effect. 3. No other acute intracranial abnormality. 4. Age-related cerebral atrophy  with moderate chronic microvascular ischemic disease.  PREVIOUS MEDICATIONS:   CURRENT MEDICATIONS:  Outpatient Encounter Medications as of 06/23/2022  Medication Sig   Acetaminophen 650 MG TABS Take 1 tablet (650 mg total) by mouth 3 (three) times daily as needed.   aspirin 81 MG tablet Take 81 mg by mouth daily.   Blood Glucose Calibration (ACCU-CHEK GUIDE CONTROL) LIQD Use as directed   Blood Glucose Monitoring Suppl (ACCU-CHEK AVIVA PLUS) w/Device KIT Use to check blood sugar 3 times a day   Blood Glucose Monitoring Suppl (ACCU-CHEK GUIDE) w/Device KIT Use as directed   calcium-vitamin D (OSCAL WITH D 500-200) 500-200 MG-UNIT per tablet Take 1 tablet by mouth daily.   clopidogrel (PLAVIX) 75 MG tablet Take 1 tablet (75 mg total) by mouth daily.   Continuous Blood Gluc Sensor (FREESTYLE LIBRE 14 DAY SENSOR) MISC Use to check blood glucose 3 times a day   Dulaglutide (TRULICITY) 3 EG/3.1DV SOPN Inject 3 mg into the skin once a week.   fluticasone (FLONASE) 50 MCG/ACT nasal spray Place 1 spray into the nose daily.   Glucagon 3 MG/DOSE POWD Place 3 mg into the nose once as needed for up to 1 dose.    glucose blood (ACCU-CHEK AVIVA PLUS) test strip Use to check blood sugar 3 times a day.   glucose blood (ACCU-CHEK GUIDE) test strip CHECK BLOOD SUGAR THREE TIMES DAILY   insulin NPH Human (NOVOLIN N) 100 UNIT/ML injection Inject 0.32 mLs (32 Units total) into the skin daily before breakfast. Pens please   Insulin Pen Needle (BD PEN NEEDLE NANO U/F) 32G X 4 MM MISC USE TO INJECT INSULIN 4 TIMES DAILY AS DIRECTED.   Lancets (ONETOUCH ULTRASOFT) lancets Use as instructed to test 3 times daily   memantine (NAMENDA) 5 MG tablet Take 1 tablet (5 mg total) by mouth daily.   Multiple Vitamin (MULTIVITAMIN) tablet Take 1 tablet by mouth daily.   NOVOLOG FLEXPEN 100 UNIT/ML FlexPen Inject under skin 5-8 units 3x a day before meals   simvastatin (ZOCOR) 40 MG tablet TAKE 1 TABLET EVERY DAY (Patient taking differently: Take 40 mg by mouth daily at 6 PM.)   No facility-administered encounter medications on file as of 06/23/2022.       06/26/2022   12:00 PM 04/25/2022    3:35 PM 08/21/2020   11:22 AM  MMSE - Mini Mental State Exam  Orientation to time _0 Orientation to Place _1 Registration _2 Attention/ Calculation _3 Recall 2 1 0  Language- name 2 objects _4 Language- repeat 1 0 1  Language- follow 3 step command _5 Language- read & follow direction _6 Write a sentence 0 0 1  Copy design 1 0 0  Total score _7 09/09/2020    1:00 PM  Montreal Cognitive Assessment   Visuospatial/ Executive (0/5) 2  Naming (0/3) 2  Attention: Read list of digits (0/2) 2  Attention: Read list of letters (0/1) 0  Attention: Serial 7 subtraction starting at 100 (0/3) 1  Language: Repeat phrase (0/2) 1  Language : Fluency (0/1) 0  Abstraction (0/2) 2  Delayed Recall (0/5) 0  Orientation (0/6) 5  Total 15  Adjusted Score (based on education) 16    Objective:     PHYSICAL EXAMINATION:    VITALS:   Vitals:   06/23/22 1116  BP:  124/74  Pulse: 82  Resp: 18   SpO2: 99%  Weight: 158 lb (71.7 kg)  Height: _0  (1.575 m)    GEN:  The patient appears stated age and is in NAD. HEENT:  Normocephalic, atraumatic.   Neurological examination:  General: NAD, well-groomed, appears stated age. Orientation: The patient is alert. Oriented to person, place and not to date, the year is 1991 Cranial nerves: There is good facial symmetry.The speech is fluent and clear. No aphasia or dysarthria. Fund of knowledge is appropriate. Recent and remote memory are impaired. Attention and concentration are reduced.  Able to name objects and repeat phrases.  Hearing is intact to conversational tone.    Sensation: Sensation is intact to light touch throughout Motor: Strength is at least antigravity x4. DTR's 2/4 in UE/LE     Movement examination: Tone: There is normal tone in the UE/LE Abnormal movements:  no tremor.  No myoclonus.  No asterixis.   Coordination:  There is no decremation with RAM's. Normal finger to nose  Gait and Station: The patient has no difficulty arising out of a deep-seated chair without the use of the hands. The patient's stride length is good.  Gait is cautious and narrow.    Thank you for allowing [place] the opportunity to participate in the care of this nice patient. Please do not hesitate to contact us for any questions or concerns.   Total time spent on today's visit was 30 minutes dedicated to this patient today, preparing to see patient, examining the patient, ordering tests and/or medications and counseling the patient, documenting clinical information in the EHR or other health record, independently interpreting results and communicating results to the patient/family, discussing treatment and goals, answering patient's questions and coordinating care.  Cc:  Dorothyann Peng, NP  Sharene Butters 06/26/2022 12:32 PM

## 2022-06-23 NOTE — Patient Instructions (Signed)
It was a pleasure to see you today at our office.   Recommendations:  Follow up in  6 months  Continue memantine  5 mg  tablet twice daily.     Feel free to visit Facebook page " [email or web address]" for tips of how to care for people with memory problems.   Whom to call:  Memory  decline, memory medications: Call our office [phone]   For psychiatric meds, mood meds: Please have your primary care physician manage these medications.    For assessment of decision of mental capacity and competency:  Call Dr. Anthoney Harada, geriatric psychiatrist at [phone]  For guidance in geriatric dementia issues please call [facility] [phone]     If you have any severe symptoms of a stroke, or other severe issues such as confusion,severe chills or fever, etc call 911 or go to the ER as you may need to be evaluated further    RECOMMENDATIONS FOR ALL PATIENTS WITH MEMORY PROBLEMS: 1. Continue to exercise (Recommend 30 minutes of walking everyday, or 3 hours every week) 2. Increase social interactions - continue going to [employer] and enjoy social gatherings with friends and family 3. Eat healthy, avoid fried foods and eat more fruits and vegetables 4. Maintain adequate blood pressure, blood sugar, and blood cholesterol level. Reducing the risk of stroke and cardiovascular disease also helps promoting better memory. 5. Avoid stressful situations. Live a simple life and avoid aggravations. Organize your time and prepare for the next day in anticipation. 6. Sleep well, avoid any interruptions of sleep and avoid any distractions in the bedroom that may interfere with adequate sleep quality 7. Avoid sugar, avoid sweets as there is a strong link between excessive sugar intake, diabetes, and cognitive impairment We discussed the Mediterranean diet, which has been shown to help patients reduce the risk of progressive memory disorders and reduces cardiovascular risk. This includes eating fish, eat  fruits and green leafy vegetables, nuts like almonds and hazelnuts, walnuts, and also use olive oil. Avoid fast foods and fried foods as much as possible. Avoid sweets and sugar as sugar use has been linked to worsening of memory function.  There is always a concern of gradual progression of memory problems. If this is the case, then we may need to adjust level of care according to patient needs. Support, both to the patient and caregiver, should then be put into place.    FALL PRECAUTIONS: Be cautious when walking. Scan the area for obstacles that may increase the risk of trips and falls. When getting up in the mornings, sit up at the edge of the bed for a few minutes before getting out of bed. Consider elevating the bed at the head end to avoid drop of blood pressure when getting up. Walk always in a well-lit room (use night lights in the walls). Avoid area rugs or power cords from appliances in the middle of the walkways. Use a walker or a cane if necessary and consider physical therapy for balance exercise. Get your eyesight checked regularly.    ABILITY TO BE LEFT ALONE: If patient is unable to contact 911 operator, consider using Life[facility], or when the need is there, arrange for someone to stay with patients. Smoking is a fire hazard, consider supervision or cessation. Risk of wandering should be assessed by caregiver and if detected at any point, supervision and safe proof recommendations should be instituted.  MEDICATION SUPERVISION: Inability to self-administer medication needs to be constantly addressed. Implement a mechanism  to ensure safe administration of the medications.     [facility] [phone] or [phone]

## 2022-08-05 ENCOUNTER — Inpatient Hospital Stay (HOSPITAL_COMMUNITY)
Admission: EM | Admit: 2022-08-05 | Discharge: 2022-09-02 | DRG: 270 | Disposition: E | Payer: Medicare (Managed Care) | Attending: Cardiology | Admitting: Cardiology

## 2022-08-05 ENCOUNTER — Inpatient Hospital Stay (HOSPITAL_COMMUNITY): Payer: Medicare (Managed Care)

## 2022-08-05 ENCOUNTER — Inpatient Hospital Stay (HOSPITAL_COMMUNITY): Admission: EM | Disposition: E | Payer: Self-pay | Source: Home / Self Care | Attending: Cardiology

## 2022-08-05 ENCOUNTER — Other Ambulatory Visit: Payer: Self-pay | Admitting: Student

## 2022-08-05 ENCOUNTER — Encounter (HOSPITAL_COMMUNITY): Payer: Self-pay | Admitting: Cardiology

## 2022-08-05 DIAGNOSIS — Z90710 Acquired absence of both cervix and uterus: Secondary | ICD-10-CM

## 2022-08-05 DIAGNOSIS — Z9841 Cataract extraction status, right eye: Secondary | ICD-10-CM

## 2022-08-05 DIAGNOSIS — R34 Anuria and oliguria: Secondary | ICD-10-CM | POA: Diagnosis not present

## 2022-08-05 DIAGNOSIS — E7800 Pure hypercholesterolemia, unspecified: Secondary | ICD-10-CM | POA: Diagnosis present

## 2022-08-05 DIAGNOSIS — E875 Hyperkalemia: Secondary | ICD-10-CM | POA: Diagnosis present

## 2022-08-05 DIAGNOSIS — Z8673 Personal history of transient ischemic attack (TIA), and cerebral infarction without residual deficits: Secondary | ICD-10-CM

## 2022-08-05 DIAGNOSIS — R570 Cardiogenic shock: Secondary | ICD-10-CM

## 2022-08-05 DIAGNOSIS — Z9861 Coronary angioplasty status: Secondary | ICD-10-CM

## 2022-08-05 DIAGNOSIS — I3139 Other pericardial effusion (noninflammatory): Secondary | ICD-10-CM | POA: Diagnosis present

## 2022-08-05 DIAGNOSIS — Z79899 Other long term (current) drug therapy: Secondary | ICD-10-CM

## 2022-08-05 DIAGNOSIS — J45909 Unspecified asthma, uncomplicated: Secondary | ICD-10-CM | POA: Diagnosis present

## 2022-08-05 DIAGNOSIS — E1122 Type 2 diabetes mellitus with diabetic chronic kidney disease: Secondary | ICD-10-CM | POA: Diagnosis present

## 2022-08-05 DIAGNOSIS — Z515 Encounter for palliative care: Secondary | ICD-10-CM

## 2022-08-05 DIAGNOSIS — Z9842 Cataract extraction status, left eye: Secondary | ICD-10-CM | POA: Diagnosis not present

## 2022-08-05 DIAGNOSIS — I2102 ST elevation (STEMI) myocardial infarction involving left anterior descending coronary artery: Secondary | ICD-10-CM

## 2022-08-05 DIAGNOSIS — I4510 Unspecified right bundle-branch block: Secondary | ICD-10-CM | POA: Diagnosis present

## 2022-08-05 DIAGNOSIS — Z7982 Long term (current) use of aspirin: Secondary | ICD-10-CM

## 2022-08-05 DIAGNOSIS — N170 Acute kidney failure with tubular necrosis: Secondary | ICD-10-CM | POA: Diagnosis not present

## 2022-08-05 DIAGNOSIS — Z87891 Personal history of nicotine dependence: Secondary | ICD-10-CM | POA: Diagnosis not present

## 2022-08-05 DIAGNOSIS — Z794 Long term (current) use of insulin: Secondary | ICD-10-CM

## 2022-08-05 DIAGNOSIS — N1832 Chronic kidney disease, stage 3b: Secondary | ICD-10-CM | POA: Diagnosis present

## 2022-08-05 DIAGNOSIS — Z66 Do not resuscitate: Secondary | ICD-10-CM | POA: Diagnosis not present

## 2022-08-05 DIAGNOSIS — I129 Hypertensive chronic kidney disease with stage 1 through stage 4 chronic kidney disease, or unspecified chronic kidney disease: Secondary | ICD-10-CM | POA: Diagnosis present

## 2022-08-05 DIAGNOSIS — Z7902 Long term (current) use of antithrombotics/antiplatelets: Secondary | ICD-10-CM

## 2022-08-05 DIAGNOSIS — I2109 ST elevation (STEMI) myocardial infarction involving other coronary artery of anterior wall: Secondary | ICD-10-CM | POA: Diagnosis present

## 2022-08-05 DIAGNOSIS — I472 Ventricular tachycardia, unspecified: Secondary | ICD-10-CM | POA: Diagnosis not present

## 2022-08-05 DIAGNOSIS — K7200 Acute and subacute hepatic failure without coma: Secondary | ICD-10-CM | POA: Diagnosis not present

## 2022-08-05 DIAGNOSIS — Z8249 Family history of ischemic heart disease and other diseases of the circulatory system: Secondary | ICD-10-CM

## 2022-08-05 DIAGNOSIS — Z9049 Acquired absence of other specified parts of digestive tract: Secondary | ICD-10-CM

## 2022-08-05 DIAGNOSIS — E872 Acidosis, unspecified: Secondary | ICD-10-CM | POA: Diagnosis present

## 2022-08-05 DIAGNOSIS — I2510 Atherosclerotic heart disease of native coronary artery without angina pectoris: Secondary | ICD-10-CM

## 2022-08-05 DIAGNOSIS — F03A0 Unspecified dementia, mild, without behavioral disturbance, psychotic disturbance, mood disturbance, and anxiety: Secondary | ICD-10-CM | POA: Diagnosis present

## 2022-08-05 DIAGNOSIS — Z888 Allergy status to other drugs, medicaments and biological substances status: Secondary | ICD-10-CM

## 2022-08-05 HISTORY — PX: LEFT HEART CATH AND CORONARY ANGIOGRAPHY: CATH118249

## 2022-08-05 HISTORY — PX: CORONARY STENT INTERVENTION: CATH118234

## 2022-08-05 HISTORY — PX: IABP INSERTION: CATH118242

## 2022-08-05 HISTORY — PX: CORONARY/GRAFT ACUTE MI REVASCULARIZATION: CATH118305

## 2022-08-05 LAB — PROTIME-INR
INR: 1.2 (ref 0.8–1.2)
Prothrombin Time: 14.7 seconds (ref 11.4–15.2)

## 2022-08-05 LAB — CBC
HCT: 30.2 % — ABNORMAL LOW (ref 36.0–46.0)
Hemoglobin: 10.1 g/dL — ABNORMAL LOW (ref 12.0–15.0)
MCH: 30.2 pg (ref 26.0–34.0)
MCHC: 33.4 g/dL (ref 30.0–36.0)
MCV: 90.4 fL (ref 80.0–100.0)
Platelets: 300 10*3/uL (ref 150–400)
RBC: 3.34 MIL/uL — ABNORMAL LOW (ref 3.87–5.11)
RDW: 13 % (ref 11.5–15.5)
WBC: 11.3 10*3/uL — ABNORMAL HIGH (ref 4.0–10.5)
nRBC: 0 % (ref 0.0–0.2)

## 2022-08-05 LAB — ECHOCARDIOGRAM COMPLETE
Calc EF: 64.4 %
Height: 62 in
S' Lateral: 2.3 cm
Single Plane A2C EF: 59.2 %
Single Plane A4C EF: 69 %
Weight: 2529.12 oz

## 2022-08-05 LAB — CBC WITH DIFFERENTIAL/PLATELET
Abs Immature Granulocytes: 0.15 10*3/uL — ABNORMAL HIGH (ref 0.00–0.07)
Basophils Absolute: 0.1 10*3/uL (ref 0.0–0.1)
Basophils Relative: 0 %
Eosinophils Absolute: 0 10*3/uL (ref 0.0–0.5)
Eosinophils Relative: 0 %
HCT: 33.8 % — ABNORMAL LOW (ref 36.0–46.0)
Hemoglobin: 10.8 g/dL — ABNORMAL LOW (ref 12.0–15.0)
Immature Granulocytes: 1 %
Lymphocytes Relative: 9 %
Lymphs Abs: 1.2 10*3/uL (ref 0.7–4.0)
MCH: 29.4 pg (ref 26.0–34.0)
MCHC: 32 g/dL (ref 30.0–36.0)
MCV: 92.1 fL (ref 80.0–100.0)
Monocytes Absolute: 1.2 10*3/uL — ABNORMAL HIGH (ref 0.1–1.0)
Monocytes Relative: 9 %
Neutro Abs: 11.2 10*3/uL — ABNORMAL HIGH (ref 1.7–7.7)
Neutrophils Relative %: 81 %
Platelets: 326 10*3/uL (ref 150–400)
RBC: 3.67 MIL/uL — ABNORMAL LOW (ref 3.87–5.11)
RDW: 13.2 % (ref 11.5–15.5)
WBC: 13.7 10*3/uL — ABNORMAL HIGH (ref 4.0–10.5)
nRBC: 0 % (ref 0.0–0.2)

## 2022-08-05 LAB — COMPREHENSIVE METABOLIC PANEL
ALT: 12 U/L (ref 0–44)
ALT: 113 U/L — ABNORMAL HIGH (ref 0–44)
AST: 28 U/L (ref 15–41)
AST: 479 U/L — ABNORMAL HIGH (ref 15–41)
Albumin: 2.6 g/dL — ABNORMAL LOW (ref 3.5–5.0)
Albumin: 2.8 g/dL — ABNORMAL LOW (ref 3.5–5.0)
Alkaline Phosphatase: 33 U/L — ABNORMAL LOW (ref 38–126)
Alkaline Phosphatase: 43 U/L (ref 38–126)
Anion gap: 14 (ref 5–15)
Anion gap: 10 (ref 5–15)
BUN: 40 mg/dL — ABNORMAL HIGH (ref 8–23)
BUN: 41 mg/dL — ABNORMAL HIGH (ref 8–23)
CO2: 15 mmol/L — ABNORMAL LOW (ref 22–32)
CO2: 20 mmol/L — ABNORMAL LOW (ref 22–32)
Calcium: 9.5 mg/dL (ref 8.9–10.3)
Calcium: 9.7 mg/dL (ref 8.9–10.3)
Chloride: 103 mmol/L (ref 98–111)
Chloride: 102 mmol/L (ref 98–111)
Creatinine, Ser: 1.62 mg/dL — ABNORMAL HIGH (ref 0.44–1.00)
Creatinine, Ser: 2.05 mg/dL — ABNORMAL HIGH (ref 0.44–1.00)
GFR, Estimated: 30 mL/min — ABNORMAL LOW (ref >60)
GFR, Estimated: 22 mL/min — ABNORMAL LOW (ref >60)
Glucose, Bld: 299 mg/dL — ABNORMAL HIGH (ref 70–99)
Glucose, Bld: 266 mg/dL — ABNORMAL HIGH (ref 70–99)
Potassium: 6.2 mmol/L — ABNORMAL HIGH (ref 3.5–5.1)
Potassium: 6.4 mmol/L (ref 3.5–5.1)
Sodium: 132 mmol/L — ABNORMAL LOW (ref 135–145)
Sodium: 132 mmol/L — ABNORMAL LOW (ref 135–145)
Total Bilirubin: 0.3 mg/dL (ref 0.3–1.2)
Total Bilirubin: 0.3 mg/dL (ref 0.3–1.2)
Total Protein: 5.5 g/dL — ABNORMAL LOW (ref 6.5–8.1)
Total Protein: 6.1 g/dL — ABNORMAL LOW (ref 6.5–8.1)

## 2022-08-05 LAB — PHOSPHORUS: Phosphorus: 5 mg/dL — ABNORMAL HIGH (ref 2.5–4.6)

## 2022-08-05 LAB — POCT ACTIVATED CLOTTING TIME
Activated Clotting Time: 223 seconds
Activated Clotting Time: 261 seconds

## 2022-08-05 LAB — HEMOGLOBIN A1C
Hgb A1c MFr Bld: 7.1 % — ABNORMAL HIGH (ref 4.8–5.6)
Mean Plasma Glucose: 157.07 mg/dL

## 2022-08-05 LAB — POCT I-STAT 7, (LYTES, BLD GAS, ICA,H+H)
Acid-base deficit: 9 mmol/L — ABNORMAL HIGH (ref 0.0–2.0)
Bicarbonate: 15.8 mmol/L — ABNORMAL LOW (ref 20.0–28.0)
Calcium, Ion: 1.31 mmol/L (ref 1.15–1.40)
HCT: 38 % (ref 36.0–46.0)
Hemoglobin: 12.9 g/dL (ref 12.0–15.0)
O2 Saturation: 95 %
Patient temperature: 97.9
Potassium: 5.9 mmol/L — ABNORMAL HIGH (ref 3.5–5.1)
Sodium: 129 mmol/L — ABNORMAL LOW (ref 135–145)
TCO2: 17 mmol/L — ABNORMAL LOW (ref 22–32)
pCO2 arterial: 29.7 mmHg — ABNORMAL LOW (ref 32–48)
pH, Arterial: 7.332 — ABNORMAL LOW (ref 7.35–7.45)
pO2, Arterial: 78 mmHg — ABNORMAL LOW (ref 83–108)

## 2022-08-05 LAB — BASIC METABOLIC PANEL
Anion gap: 11 (ref 5–15)
BUN: 40 mg/dL — ABNORMAL HIGH (ref 8–23)
CO2: 17 mmol/L — ABNORMAL LOW (ref 22–32)
Calcium: 9.7 mg/dL (ref 8.9–10.3)
Chloride: 105 mmol/L (ref 98–111)
Creatinine, Ser: 1.61 mg/dL — ABNORMAL HIGH (ref 0.44–1.00)
GFR, Estimated: 30 mL/min — ABNORMAL LOW (ref >60)
Glucose, Bld: 301 mg/dL — ABNORMAL HIGH (ref 70–99)
Potassium: 6.1 mmol/L — ABNORMAL HIGH (ref 3.5–5.1)
Sodium: 133 mmol/L — ABNORMAL LOW (ref 135–145)

## 2022-08-05 LAB — LIPID PANEL
Cholesterol: 106 mg/dL (ref 0–200)
HDL: 39 mg/dL — ABNORMAL LOW (ref >40)
LDL Cholesterol: 21 mg/dL (ref 0–99)
Total CHOL/HDL Ratio: 2.7 RATIO
Triglycerides: 230 mg/dL — ABNORMAL HIGH (ref <150)
VLDL: 46 mg/dL — ABNORMAL HIGH (ref 0–40)

## 2022-08-05 LAB — POCT I-STAT, CHEM 8
BUN: 35 mg/dL — ABNORMAL HIGH (ref 8–23)
Calcium, Ion: 1.37 mmol/L (ref 1.15–1.40)
Chloride: 107 mmol/L (ref 98–111)
Creatinine, Ser: 1.6 mg/dL — ABNORMAL HIGH (ref 0.44–1.00)
Glucose, Bld: 299 mg/dL — ABNORMAL HIGH (ref 70–99)
HCT: 29 % — ABNORMAL LOW (ref 36.0–46.0)
Hemoglobin: 9.9 g/dL — ABNORMAL LOW (ref 12.0–15.0)
Potassium: 6.3 mmol/L (ref 3.5–5.1)
Sodium: 133 mmol/L — ABNORMAL LOW (ref 135–145)
TCO2: 17 mmol/L — ABNORMAL LOW (ref 22–32)

## 2022-08-05 LAB — LACTIC ACID, PLASMA
Lactic Acid, Venous: 3.6 mmol/L (ref 0.5–1.9)
Lactic Acid, Venous: 4.2 mmol/L (ref 0.5–1.9)
Lactic Acid, Venous: 2.8 mmol/L (ref 0.5–1.9)

## 2022-08-05 LAB — MAGNESIUM
Magnesium: 1.6 mg/dL — ABNORMAL LOW (ref 1.7–2.4)
Magnesium: 2.7 mg/dL — ABNORMAL HIGH (ref 1.7–2.4)

## 2022-08-05 LAB — GLUCOSE, CAPILLARY
Glucose-Capillary: 287 mg/dL — ABNORMAL HIGH (ref 70–99)
Glucose-Capillary: 283 mg/dL — ABNORMAL HIGH (ref 70–99)
Glucose-Capillary: 253 mg/dL — ABNORMAL HIGH (ref 70–99)

## 2022-08-05 LAB — APTT: aPTT: <20 seconds — ABNORMAL LOW (ref 24–36)

## 2022-08-05 LAB — TROPONIN I (HIGH SENSITIVITY): Troponin I (High Sensitivity): 192 ng/L (ref <18)

## 2022-08-05 LAB — HEPARIN LEVEL (UNFRACTIONATED): Heparin Unfractionated: 0.58 IU/mL (ref 0.30–0.70)

## 2022-08-05 SURGERY — LEFT HEART CATH AND CORONARY ANGIOGRAPHY
Anesthesia: LOCAL

## 2022-08-05 MED ORDER — NOREPINEPHRINE BITARTRATE 1 MG/ML IV SOLN
INTRAVENOUS | Status: AC | PRN
  Administered 2022-08-05: 15 ug/min via INTRAVENOUS

## 2022-08-05 MED ORDER — CALCIUM GLUCONATE-NACL 1-0.675 GM/50ML-% IV SOLN
1.0000 g | Freq: Once | INTRAVENOUS | Status: AC
  Administered 2022-08-05: 1000 mg via INTRAVENOUS
  Filled 2022-08-05: qty 50

## 2022-08-05 MED ORDER — HEPARIN (PORCINE) IN NACL 1000-0.9 UT/500ML-% IV SOLN
INTRAVENOUS | Status: AC
  Filled 2022-08-05: qty 500

## 2022-08-05 MED ORDER — HEPARIN (PORCINE) IN NACL 1000-0.9 UT/500ML-% IV SOLN
INTRAVENOUS | Status: DC | PRN
  Administered 2022-08-05 (×3): 500 mL

## 2022-08-05 MED ORDER — AMIODARONE HCL IN DEXTROSE 360-4.14 MG/200ML-% IV SOLN
60.0000 mg/h | INTRAVENOUS | Status: AC
  Administered 2022-08-05 (×2): 60 mg/h via INTRAVENOUS
  Filled 2022-08-05 (×2): qty 200

## 2022-08-05 MED ORDER — NOREPINEPHRINE 4 MG/250ML-% IV SOLN
0.0000 ug/min | INTRAVENOUS | Status: DC
  Administered 2022-08-05: 14 ug/min via INTRAVENOUS
  Administered 2022-08-05 (×2): 12 ug/min via INTRAVENOUS
  Administered 2022-08-06: 14 ug/min via INTRAVENOUS
  Filled 2022-08-05 (×3): qty 250

## 2022-08-05 MED ORDER — IOHEXOL 350 MG/ML SOLN
INTRAVENOUS | Status: DC | PRN
  Administered 2022-08-05: 120 mL

## 2022-08-05 MED ORDER — TICAGRELOR 90 MG PO TABS
180.0000 mg | ORAL_TABLET | Freq: Once | ORAL | Status: AC
  Administered 2022-08-05: 180 mg via ORAL
  Filled 2022-08-05: qty 2

## 2022-08-05 MED ORDER — LIDOCAINE HCL (PF) 1 % IJ SOLN
INTRAMUSCULAR | Status: DC | PRN
  Administered 2022-08-05 (×2): 15 mL

## 2022-08-05 MED ORDER — NOREPINEPHRINE BITARTRATE 1 MG/ML IV SOLN
INTRAVENOUS | Status: AC | PRN
  Administered 2022-08-05: 10 ug/min via INTRAVENOUS

## 2022-08-05 MED ORDER — TICAGRELOR 90 MG PO TABS
90.0000 mg | ORAL_TABLET | Freq: Two times a day (BID) | ORAL | Status: DC
  Administered 2022-08-05: 90 mg via ORAL
  Filled 2022-08-05: qty 1

## 2022-08-05 MED ORDER — TICAGRELOR 90 MG PO TABS: 90.0000 mg | ORAL_TABLET | Freq: Once | ORAL | Status: DC

## 2022-08-05 MED ORDER — ACETAMINOPHEN 325 MG PO TABS: 650.0000 mg | ORAL_TABLET | ORAL | Status: DC | PRN

## 2022-08-05 MED ORDER — TRAZODONE HCL 50 MG PO TABS
50.0000 mg | ORAL_TABLET | Freq: Every day | ORAL | Status: DC
  Administered 2022-08-05: 50 mg via ORAL
  Filled 2022-08-05: qty 1

## 2022-08-05 MED ORDER — ASPIRIN 81 MG PO TBEC: 81.0000 mg | DELAYED_RELEASE_TABLET | Freq: Every day | ORAL | Status: DC

## 2022-08-05 MED ORDER — HEPARIN SODIUM (PORCINE) 1000 UNIT/ML IJ SOLN
INTRAMUSCULAR | Status: AC
  Filled 2022-08-05: qty 20

## 2022-08-05 MED ORDER — NOREPINEPHRINE BITARTRATE 1 MG/ML IV SOLN
INTRAVENOUS | Status: AC | PRN
  Administered 2022-08-05: 20 ug/min via INTRAVENOUS

## 2022-08-05 MED ORDER — FUROSEMIDE 10 MG/ML IJ SOLN
40.0000 mg | Freq: Once | INTRAMUSCULAR | Status: AC
  Administered 2022-08-05: 40 mg via INTRAVENOUS
  Filled 2022-08-05: qty 4

## 2022-08-05 MED ORDER — HEPARIN SODIUM (PORCINE) 1000 UNIT/ML IJ SOLN
INTRAMUSCULAR | Status: DC | PRN
  Administered 2022-08-05: 2000 [IU] via INTRAVENOUS
  Administered 2022-08-05: 8000 [IU] via INTRAVENOUS
  Administered 2022-08-05: 3000 [IU] via INTRAVENOUS

## 2022-08-05 MED ORDER — SODIUM ZIRCONIUM CYCLOSILICATE 10 G PO PACK
10.0000 g | PACK | Freq: Once | ORAL | Status: AC
  Administered 2022-08-05: 10 g via ORAL
  Filled 2022-08-05: qty 1

## 2022-08-05 MED ORDER — LORAZEPAM 2 MG/ML IJ SOLN
1.0000 mg | Freq: Once | INTRAMUSCULAR | Status: AC
  Administered 2022-08-05: 1 mg via INTRAVENOUS

## 2022-08-05 MED ORDER — ORAL CARE MOUTH RINSE: 15.0000 mL | OROMUCOSAL | Status: DC | PRN

## 2022-08-05 MED ORDER — ONDANSETRON HCL 4 MG/2ML IJ SOLN
4.0000 mg | Freq: Four times a day (QID) | INTRAMUSCULAR | Status: DC | PRN
  Administered 2022-08-05: 4 mg via INTRAVENOUS
  Filled 2022-08-05: qty 2

## 2022-08-05 MED ORDER — FUROSEMIDE 10 MG/ML IJ SOLN: 40.0000 mg | Freq: Two times a day (BID) | INTRAMUSCULAR | Status: DC

## 2022-08-05 MED ORDER — HEPARIN (PORCINE) 25000 UT/250ML-% IV SOLN
650.0000 [IU]/h | INTRAVENOUS | Status: DC
  Administered 2022-08-05: 700 [IU]/h via INTRAVENOUS
  Filled 2022-08-05: qty 250

## 2022-08-05 MED ORDER — SODIUM CHLORIDE 0.9 % IV SOLN
INTRAVENOUS | Status: AC | PRN
  Administered 2022-08-05: 4 ug/kg/min via INTRAVENOUS

## 2022-08-05 MED ORDER — SODIUM CHLORIDE 0.9% FLUSH
3.0000 mL | Freq: Two times a day (BID) | INTRAVENOUS | Status: DC
  Administered 2022-08-05: 3 mL via INTRAVENOUS

## 2022-08-05 MED ORDER — INSULIN ASPART 100 UNIT/ML IJ SOLN
0.0000 [IU] | Freq: Three times a day (TID) | INTRAMUSCULAR | Status: DC
  Administered 2022-08-05 (×2): 8 [IU] via SUBCUTANEOUS

## 2022-08-05 MED ORDER — SODIUM CHLORIDE 0.9 % IV SOLN
12.5000 mg | Freq: Once | INTRAVENOUS | Status: AC
  Administered 2022-08-05: 12.5 mg via INTRAVENOUS
  Filled 2022-08-05: qty 0.5

## 2022-08-05 MED ORDER — VERAPAMIL HCL 2.5 MG/ML IV SOLN
INTRAVENOUS | Status: AC
  Filled 2022-08-05: qty 2

## 2022-08-05 MED ORDER — SODIUM CHLORIDE 0.9 % IV SOLN
250.0000 mL | INTRAVENOUS | Status: DC | PRN
  Administered 2022-08-05 (×2): 250 mL via INTRAVENOUS

## 2022-08-05 MED ORDER — PANTOPRAZOLE SODIUM 40 MG PO TBEC
40.0000 mg | DELAYED_RELEASE_TABLET | Freq: Every day | ORAL | Status: DC
  Administered 2022-08-05: 40 mg via ORAL
  Filled 2022-08-05: qty 1

## 2022-08-05 MED ORDER — CANGRELOR BOLUS VIA INFUSION
INTRAVENOUS | Status: DC | PRN
  Administered 2022-08-05: 2190 ug via INTRAVENOUS

## 2022-08-05 MED ORDER — FUROSEMIDE 10 MG/ML IJ SOLN
40.0000 mg | Freq: Two times a day (BID) | INTRAMUSCULAR | Status: DC
  Administered 2022-08-05: 40 mg via INTRAVENOUS
  Filled 2022-08-05: qty 4

## 2022-08-05 MED ORDER — CANGRELOR TETRASODIUM 50 MG IV SOLR
INTRAVENOUS | Status: AC
  Filled 2022-08-05: qty 50

## 2022-08-05 MED ORDER — ATORVASTATIN CALCIUM 40 MG PO TABS
40.0000 mg | ORAL_TABLET | Freq: Every day | ORAL | Status: DC
  Administered 2022-08-05: 40 mg via ORAL
  Filled 2022-08-05: qty 1

## 2022-08-05 MED ORDER — AMIODARONE HCL IN DEXTROSE 360-4.14 MG/200ML-% IV SOLN
30.0000 mg/h | INTRAVENOUS | Status: DC
  Administered 2022-08-05: 30 mg/h via INTRAVENOUS
  Filled 2022-08-05: qty 200

## 2022-08-05 MED ORDER — LORAZEPAM 2 MG/ML IJ SOLN
INTRAMUSCULAR | Status: AC
  Filled 2022-08-05: qty 1

## 2022-08-05 MED ORDER — SODIUM CHLORIDE 0.9% FLUSH: 3.0000 mL | INTRAVENOUS | Status: DC | PRN

## 2022-08-05 MED ORDER — NOREPINEPHRINE 4 MG/250ML-% IV SOLN
INTRAVENOUS | Status: AC
  Filled 2022-08-05: qty 250

## 2022-08-05 MED ORDER — LIDOCAINE HCL (PF) 1 % IJ SOLN
INTRAMUSCULAR | Status: AC
  Filled 2022-08-05: qty 60

## 2022-08-05 MED ORDER — MAGNESIUM SULFATE 2 GM/50ML IV SOLN
2.0000 g | Freq: Once | INTRAVENOUS | Status: AC
  Administered 2022-08-05: 2 g via INTRAVENOUS
  Filled 2022-08-05: qty 50

## 2022-08-05 MED ORDER — SODIUM CHLORIDE 0.9 % IV SOLN: 4.0000 ug/kg/min | INTRAVENOUS | Status: DC

## 2022-08-05 MED ORDER — SODIUM CHLORIDE 0.9 % IV SOLN
INTRAVENOUS | Status: AC | PRN
  Administered 2022-08-05: 800 mL via INTRAVENOUS
  Administered 2022-08-05: 50 mL/h via INTRAVENOUS

## 2022-08-05 MED ORDER — NOREPINEPHRINE BITARTRATE 1 MG/ML IV SOLN
INTRAVENOUS | Status: AC | PRN
  Administered 2022-08-05: 5 ug/min via INTRAVENOUS

## 2022-08-05 SURGICAL SUPPLY — 26 items
BALLN EMERGE MR 2.5X12 (BALLOONS) ×1
BALLN IABP SENSA PLUS 7.5F 40C (BALLOONS) ×1
BALLN ~~LOC~~ EMERGE MR 3.5X15 (BALLOONS) ×1
BALLOON EMERGE MR 2.5X12 (BALLOONS) IMPLANT
BALLOON IABP SENS PLUS 7.5F40C (BALLOONS) IMPLANT
BALLOON ~~LOC~~ EMERGE MR 3.5X15 (BALLOONS) IMPLANT
CATH INFINITI 5 FR 3DRC (CATHETERS) IMPLANT
CATH INFINITI 5FR AL1 (CATHETERS) IMPLANT
CATH INFINITI 5FR MULTPACK ANG (CATHETERS) IMPLANT
CATH VISTA GUIDE 6FR XBLAD3.5 (CATHETERS) IMPLANT
GLIDESHEATH SLEND SS 6F .021 (SHEATH) IMPLANT
GUIDEWIRE INQWIRE 1.5J.035X260 (WIRE) IMPLANT
INQWIRE 1.5J .035X260CM (WIRE)
KIT ENCORE 26 ADVANTAGE (KITS) IMPLANT
KIT HEART LEFT (KITS) ×1 IMPLANT
KIT MICROPUNCTURE NIT STIFF (SHEATH) IMPLANT
PACK CARDIAC CATHETERIZATION (CUSTOM PROCEDURE TRAY) ×1 IMPLANT
SHEATH PINNACLE 6F 10CM (SHEATH) IMPLANT
SHEATH PROBE COVER 6X72 (BAG) IMPLANT
STENT SYNERGY XD 3.0X38 (Permanent Stent) IMPLANT
SYNERGY XD 3.0X38 (Permanent Stent) ×1 IMPLANT
TRANSDUCER W/STOPCOCK (MISCELLANEOUS) ×1 IMPLANT
TUBING CIL FLEX 10 FLL-RA (TUBING) ×1 IMPLANT
WIRE ASAHI GRAND SLAM 180CM (WIRE) IMPLANT
WIRE ASAHI PROWATER 180CM (WIRE) IMPLANT
WIRE EMERALD 3MM-J .035X150CM (WIRE) IMPLANT

## 2022-08-05 NOTE — Procedures (Signed)
Central Venous Catheter Insertion Procedure Note  KARINE GARN  [id number]  [birth year]  Date:08/26/2022  Time:4:52 PM   Provider Performing:Leeann Bady Jerilynn Mages Ayesha Rumpf   Procedure: Insertion of Non-tunneled Central Venous [phone]) with US guidance ([id number])   Indication(s) Medication administration and Difficult access  Consent Risks of the procedure as well as the alternatives and risks of each were explained to the patient and/or caregiver.  Consent for the procedure was obtained and is signed in the bedside chart  Anesthesia Topical only with 1% lidocaine   Timeout Verified patient identification, verified procedure, site/side was marked, verified correct patient position, special equipment/implants available, medications/allergies/relevant history reviewed, required imaging and test results available.  Sterile Technique Maximal sterile technique including full sterile barrier drape, hand hygiene, sterile gown, sterile gloves, mask, hair covering, sterile ultrasound probe cover (if used).  Procedure Description Area of catheter insertion was cleaned with chlorhexidine and draped in sterile fashion.  With real-time ultrasound guidance a central venous catheter was placed into the left internal jugular vein. Nonpulsatile blood flow and easy flushing noted in all ports.  The catheter was sutured in place and sterile dressing applied.    Complications/Tolerance None; patient tolerated the procedure well. Chest X-ray is ordered to verify placement for internal jugular or subclavian cannulation.   EBL Minimal  Specimen(s) None  Lestine Mount, [facility] 08/11/2022 4:52 PM  Please see [email or web address] for pager details.  From 7A-7P if no response, please call [phone] After hours, please call [facility]Link [phone]

## 2022-08-05 NOTE — H&P (Signed)
Cardiology Admission History and Physical   Patient ID: [name] [name] MRN: [id number]; DOB: 03-13-1929   Admission date: 08/26/2022  PCP:  Dorothyann Peng, NP   [facility] Cardiologist:  [facility] (Dr. Martinique)  Chief Complaint:  chest pain and STEMI  Patient Profile:   [name] [name] is a 89 y.o. female with a history of  hypertension, hyperlipidemia, type 2 diabetes mellitus, CKD stage III, stroke in 09/2020, and dementia who is being seen 08/07/2022 for the evaluation of chest pain and STEMI.  History of Present Illness:   [name] [name] is a 46 female with angina, hyperlipidemia, type 2 diabetes, prior stroke in 09/2020.  No known cardiac history. Patient has underlying dementia and most of history was obtained from EMS. Patient has had some functional decline over the last 2 months. She woke up this morning and was doing OK. She had an appointment at the hair salon so she started getting ready for this. While getting ready, she had a general sense of unwellness and weakness and started having mild chest discomfort. She went to the hair salon and when she arrived she so weak that she had trouble getting in the chair. She then started complaining of more significant chest pain and EMS was called. EKG in the field showed normal sinus rhythm, with frequent PACs (often bigeminy) and new RBBB as well as ST elevation in lead I , avL, and V2. Code STEMI was called. She did not have any complaints of shortness of breath and initially denied nausea. However, then her BP started to drop and she vomiting once en route to the ED. Systolic BP was initially 100 on EMS arrival and then dropped to the 70s. On arrival to [facility], patient reported she "just hurts" and was numb but could not tell me where. She was still hypotensive and had a weak radial pulse. She does have a history of dementia but patient told EMS en route that she wanted to be full code and agreed to cardiac  catheterization. Therefore, patient was taken to the cath lab for emergent cardiac catheterization.  Past Medical History:  Diagnosis Date   Asthma    DEGENERATIVE JOINT DISEASE, GENERALIZED 02/01/2007   Qualifier: Diagnosis of  By: Burnice Logan  MD, Doretha Sou    DIARRHEA, CHRONIC 08/20/2009   Qualifier: Diagnosis of  By: Burnice Logan  MD, Doretha Sou    DM 02/01/2007   Qualifier: Diagnosis of  By: Floyde Parkins     FATIGUE 10/20/2008   Qualifier: Diagnosis of  By: Burnice Logan  MD, Doretha Sou    HYPERCHOLESTEROLEMIA 02/01/2007   Qualifier: Diagnosis of  By: Mariam Dollar 12/20/2007   Qualifier: Diagnosis of  By: Burnice Logan  MD, Doretha Sou    HYPERTENSION 02/01/2007   Qualifier: Diagnosis of  By: Russella Dar BACK PAIN 01/13/2010   Qualifier: Diagnosis of  By: Burnice Logan  MD, Doretha Sou    MUSCLE CRAMPS 04/06/2010   Qualifier: Diagnosis of  By: Burnice Logan  MD, Doretha Sou    MYALGIA 01/20/2010   Qualifier: Diagnosis of  By: Elease Hashimoto MD, Bruce     OSTEOARTHRITIS 01/13/2010   Qualifier: Diagnosis of  By: Burnice Logan  MD, Doretha Sou    OVERACTIVE BLADDER 08/20/2009   Qualifier: Diagnosis of  By: Burnice Logan  MD, Doretha Sou    PLEURISY 06/18/2008   Qualifier: Diagnosis of  By: Niel Hummer MD, Lorinda Creed    PNEUMONIA 06/18/2008  Qualifier: Diagnosis of  By: Niel Hummer MD, Lorinda Creed    Rectal prolapse 09/20/2007   Qualifier: Diagnosis of  By: Burnice Logan  MD, Doretha Sou    VITAMIN B12 DEFICIENCY 07/20/2010   Qualifier: Diagnosis of  By: Carlean Purl MD, Dimas Millin     Past Surgical History:  Procedure Laterality Date   ABDOMINAL HYSTERECTOMY     partial- states she has her ovaries   APPENDECTOMY     BLADDER SURGERY     CATARACT EXTRACTION Bilateral 2012   CHOLECYSTECTOMY       Medications Prior to Admission: Prior to Admission medications   Medication Sig Start Date End Date Taking? Authorizing Provider  Acetaminophen 650 MG TABS Take 1 tablet (650 mg total) by mouth 3 (three) times daily as  needed. 12/19/12   Moreno-Coll, Adlih, MD  aspirin 81 MG tablet Take 81 mg by mouth daily.    [provider]  Blood Glucose Calibration (ACCU-CHEK GUIDE CONTROL) LIQD Use as directed 05/07/20   Dorothyann Peng, NP  Blood Glucose Monitoring Suppl (ACCU-CHEK AVIVA PLUS) w/Device KIT Use to check blood sugar 3 times a day 08/15/19   Philemon Kingdom, MD  Blood Glucose Monitoring Suppl (ACCU-CHEK GUIDE) w/Device KIT Use as directed 05/07/20   Dorothyann Peng, NP  calcium-vitamin D (OSCAL WITH D 500-200) 500-200 MG-UNIT per tablet Take 1 tablet by mouth daily.    [provider]  clopidogrel (PLAVIX) 75 MG tablet Take 1 tablet (75 mg total) by mouth daily. 09/12/21   Virl Axe, MD  Continuous Blood Gluc Sensor (FREESTYLE LIBRE 14 DAY SENSOR) MISC Use to check blood glucose 3 times a day 09/30/20   Philemon Kingdom, MD  Dulaglutide (TRULICITY) 3 VO/3.5KK SOPN Inject 3 mg into the skin once a week. 06/11/21   Philemon Kingdom, MD  fluticasone (FLONASE) 50 MCG/ACT nasal spray Place 1 spray into the nose daily. 12/19/12   Moreno-Coll, Adlih, MD  Glucagon 3 MG/DOSE POWD Place 3 mg into the nose once as needed for up to 1 dose. 09/11/20   Philemon Kingdom, MD  glucose blood (ACCU-CHEK AVIVA PLUS) test strip Use to check blood sugar 3 times a day. 08/15/19   Philemon Kingdom, MD  glucose blood (ACCU-CHEK GUIDE) test strip CHECK BLOOD SUGAR THREE TIMES DAILY 09/02/20   Philemon Kingdom, MD  insulin NPH Human (NOVOLIN N) 100 UNIT/ML injection Inject 0.32 mLs (32 Units total) into the skin daily before breakfast. Pens please 09/21/21   Philemon Kingdom, MD  Insulin Pen Needle (BD PEN NEEDLE NANO U/F) 32G X 4 MM MISC USE TO INJECT INSULIN 4 TIMES DAILY AS DIRECTED. 08/15/19   Philemon Kingdom, MD  Lancets [facility] ULTRASOFT) lancets Use as instructed to test 3 times daily 08/15/19   Philemon Kingdom, MD  memantine (NAMENDA) 5 MG tablet Take 1 tablet (5 mg total) by mouth daily. 12/22/21   Rondel Jumbo, PA-C  Multiple Vitamin (MULTIVITAMIN) tablet Take 1 tablet by mouth daily.    [provider]  NOVOLOG FLEXPEN 100 UNIT/ML FlexPen Inject under skin 5-8 units 3x a day before meals 09/21/21   Philemon Kingdom, MD  simvastatin (ZOCOR) 40 MG tablet TAKE 1 TABLET EVERY DAY Patient taking differently: Take 40 mg by mouth daily at 6 PM. 03/10/21   Dorothyann Peng, NP     Allergies:    Allergies  Allergen Reactions   Metformin Diarrhea   Sitagliptin Phosphate Other (See Comments)    abdominal pain    Social History:  Social History   Socioeconomic History   Marital status: Single    Spouse name: Not on file   Number of children: Not on file   Years of education: Not on file   Highest education level: Not on file  Occupational History   Not on file  Tobacco Use   Smoking status: Former    Packs/day: 10.00    Types: Cigarettes   Smokeless tobacco: Never   Tobacco comments:    quit > 7 yo   Vaping Use   Vaping Use: Never used  Substance and Sexual Activity   Alcohol use: No   Drug use: No   Sexual activity: Not on file  Other Topics Concern   Not on file  Social History Narrative   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    Married for 59 years, widowed for 22 years    Right handed    Lives alone   Social Determinants of Health   Financial Resource Strain: Low Risk  (08/09/2021)   Overall Financial Resource Strain (CARDIA)    Difficulty of Paying Living Expenses: Not hard at all  Food Insecurity: No Food Insecurity (08/09/2021)   Hunger Vital Sign    Worried About Running Out of Food in the Last Year: Never true    [place] in the Last Year: Never true  Transportation Needs: No Transportation Needs (08/09/2021)   PRAPARE - Hydrologist (Medical): No    Lack of Transportation (Non-Medical): No  Physical Activity: Inactive (08/09/2021)   Exercise Vital Sign    Days of Exercise per Week: 0 days    Minutes of Exercise per Session: 0 min  Stress:  No Stress Concern Present (08/09/2021)   [place]    Feeling of Stress : Not at all  Social Connections: Moderately Integrated (08/09/2021)   Social Connection and Isolation Panel [NHANES]    Frequency of Communication with Friends and Family: More than three times a week    Frequency of Social Gatherings with Friends and Family: More than three times a week    Attends Religious Services: 1 to 4 times per year    Active Member of [employer] or Organizations: Yes    Attends Archivist Meetings: 1 to 4 times per year    Marital Status: Widowed  Intimate Partner Violence: Not At Risk (08/09/2021)   Humiliation, Afraid, Rape, and Kick questionnaire    Fear of Current or Ex-Partner: No    Emotionally Abused: No    Physically Abused: No    Sexually Abused: No    Family History:   The patient's family history includes Asthma in her father; COPD in her father; Deep vein thrombosis in her mother; Heart attack in her brother and maternal grandfather; Prostate cancer in her brother. There is no history of Diabetes, Heart disease, or Stroke.    ROS:  Please see the history of present illness.  Unable to obtain full ROS due to need for emergent cardiac catheterization in setting of STEMI.  Physical Exam/Data:  There were no vitals filed for this visit. No intake or output data in the 24 hours ending 08/08/2022 1059    06/23/2022   11:16 AM 06/06/2022    1:23 PM 02/03/2022   11:18 AM  Last 3 Weights  Weight (lbs) 158 lb 161 lb 6.4 oz 160 lb 3.2 oz  Weight (kg) 71.668 kg 73.211 kg 72.666 kg  There is no height or weight on file to calculate BMI.  General: 89 y.o. female resting comfortably in no acute distress. HEENT: Normocephalic and atraumatic. Sclera clear.  Neck: Supple. No JVD. Heart: Irregular rhythm. Faint heart sounds. No murmurs, gallops, or rubs. Weak radial pulse.  Lungs: No increased work of breathing. Clear to  ausculation anteriorly.  Abdomen: Soft, non-distended, and non-tender to palpation.  Extremities: No lower extremity edema.    Neuro: No deficits. Underlying dementia.  ** Abbreviated exam due to need for emergent cardiac catheterization **   EKG:  The ECG that was done was personally reviewed and demonstrates normal sinus rhythm, with frequent PACs (often bigeminy) and new RBBB as well as ST elevation in lead I , avL, and V2.  Relevant CV Studies:  Echocardiogram 09/10/2021: 1. Left ventricular ejection fraction, by estimation, is 55 to 60%. The  left ventricle has normal function. The left ventricle has no regional  wall motion abnormalities. There is mild asymmetric left ventricular  hypertrophy of the basal-septal segment.  Left ventricular diastolic parameters are consistent with Grade I  diastolic dysfunction (impaired relaxation).   2. Right ventricular systolic function is normal. The right ventricular  size is normal. There is normal pulmonary artery systolic pressure.   3. The mitral valve is grossly normal. No evidence of mitral valve  regurgitation. No evidence of mitral stenosis.   4. The aortic valve is tricuspid. Aortic valve regurgitation is mild.  Aortic valve sclerosis is present, with no evidence of aortic valve  stenosis.   5. Aortic dilatation noted. There is mild dilatation of the ascending  aorta, measuring 42 mm.   6. The inferior vena cava is normal in size with greater than 50%  respiratory variability, suggesting right atrial pressure of 3 mmHg.   Comparison(s): No prior Echocardiogram.     Laboratory Data:  High Sensitivity Troponin:  No results for input(s): "TROPONINIHS" in the last 720 hours.    ChemistryNo results for input(s): "NA", "K", "CL", "CO2", "GLUCOSE", "BUN", "CREATININE", "CALCIUM", "MG", "GFRNONAA", "GFRAA", "ANIONGAP" in the last 168 hours.  No results for input(s): "PROT", "ALBUMIN", "AST", "ALT", "ALKPHOS", "BILITOT" in the last 168  hours. Lipids No results for input(s): "CHOL", "TRIG", "HDL", "LABVLDL", "LDLCALC", "CHOLHDL" in the last 168 hours. HematologyNo results for input(s): "WBC", "RBC", "HGB", "HCT", "MCV", "MCH", "MCHC", "RDW", "PLT" in the last 168 hours. Thyroid No results for input(s): "TSH", "FREET4" in the last 168 hours. BNPNo results for input(s): "BNP", "PROBNP" in the last 168 hours.  DDimer No results for input(s): "DDIMER" in the last 168 hours.   Radiology/Studies:  No results found.   Assessment and Plan:   STEMI Patient presented with chest pain and found to have a new RBBB and ST elevation in lateral leads on EKG in the field. Code STEMI was called. On arrival to [facility], she was hypotensive with systolic BP in the 70W and had weak radial pulses. She was taken to cath lab for emergent cardiac catheterization. - Continue Aspirin 81mg  daily. - Will check troponin and routine labs. - Will check Echo.  - On Simvastatin 40mg  daily at home. Will switch to Lipitor 40mg  daily.  - Further recommendations for pending cath results.  Hypotension Patient has a history of hypertension but was hypotensive on arrival with systolic BP in the 23J and then dropped to the 50s prior to cath.  - She was started on Levophed during cath and balloon pump was placed. - Will check lactic acid.  Hyperlipidemia LDL 97 in 09/2021. - On Simvastatin at home. Will switch to Lipitor 40mg  daily. - Will repeat lipid panel.   Type 2 Diabetes Mellitus Hemoglobin A1c 7.4% in 16/1096.  - On Trulicity and Insulin (Novolog and Novolin) at home. Will hold this and start sliding scale insulin.  CKD Stage III Baseline creatinine around 1.3 to 1.5.  - Labs pending.  Dementia Patient does have underlying dementia. Per chart review, there is mention in a note from 09/2021 about patient leaving home and being found a mile away from her house confused.    Risk Assessment/Risk Scores:   TIMI Risk Score for ST  Elevation MI:    The patient's TIMI risk score is 7, which indicates a 23.4% risk of all cause mortality at 30 days.{   Severity of Illness: The appropriate patient status for this patient is INPATIENT. Inpatient status is judged to be reasonable and necessary in order to provide the required intensity of service to ensure the patient's safety. The patient's presenting symptoms, physical exam findings, and initial radiographic and laboratory data in the context of their chronic comorbidities is felt to place them at high risk for further clinical deterioration. Furthermore, it is not anticipated that the patient will be medically stable for discharge from the hospital within 2 midnights of admission.   * I certify that at the point of admission it is my clinical judgment that the patient will require inpatient hospital care spanning beyond 2 midnights from the point of admission due to high intensity of service, high risk for further deterioration and high frequency of surveillance required.*   For questions or updates, please contact [place] Please consult [email or web address] for contact info under     Signed, Darreld Mclean, PA-C  08/04/2022 10:59 AM

## 2022-08-05 NOTE — Progress Notes (Signed)
Orthopedic Tech Progress Note Patient Details:  PALOMA GRANGE [birth year] [id number]  Ortho Devices Type of Ortho Device: Knee Immobilizer Ortho Device/Splint Location: BLE Ortho Device/Splint Interventions: Ordered, Adjustment, Application   Post Interventions Patient Tolerated: Well  Barclay Lennox A Valetta Mulroy 08/08/2022, 3:41 PM

## 2022-08-05 NOTE — Progress Notes (Signed)
Echocardiogram 2D Echocardiogram has been performed.  Oneal Deputy Kabao Leite RDCS 08/25/2022, 2:48 PM

## 2022-08-05 NOTE — Progress Notes (Signed)
error 

## 2022-08-05 NOTE — Progress Notes (Signed)
  CTSP due to IABP low on xray   CXR and KUB reviewed.  IABP about 8-10cm low  Pump placed on standby and IABP advanced. Repeat CXR obtained and tip about 2-3cm low. IABP advanced into better position and repeat CXR obtained. Device secured into place and pump restart.   Patient remains on NE for BP support. Good pulsativity beneath IABP. BP and NE parameters d/w nursing staff.   Additional CCT 40 mins  Glori Bickers, MD  7:41 PM

## 2022-08-05 NOTE — Progress Notes (Addendum)
Advanced Heart Failure Team Consult Note   Primary Physician: Dorothyann Peng, NP PCP-Cardiologist:  None  Reason for Consultation: Cardiogenic Shock   HPI:    [name] [name] is seen today for evaluation of cardiogenic shock at the request of Dr Martinique.  [name] [name] is a 89 year old with a history of dementia, HTN, DMII, HLD, CKD Stage III, and stroke. No previous cardiac history.   Most recent Echo 2023 EF 55-60%, now WMA, and grade IDD.   Presented to ED with chest pain. Admitted with STEMI. EKG with ST elevation lead I, AVL, and V2. Hypotensive on arrival. CODE STEMI called. Take urgently to the cath lab acute occlusion proximal LAD , 90% L Cx, and LVEDP 30.  S/P PCI DES to LAD. Loaded on Brillinta. IABP placed to stabilize. Had NSVT and started on amio drip and started on Norepi 12 mcg. Lactic acid 3.5 K 6.1.   Echo EF 60-65% RV normal. Moderate pericardial effusion.     Review of Systems: [y] = yes, [ ]  = no   General: Weight gain [ ] ; Weight loss [ ] ; Anorexia [ ] ; Fatigue [ ] ; Fever [ ] ; Chills [ ] ; Weakness [ ]   Cardiac: Chest pain/pressure [ Y]; Resting SOB [ ] ; Exertional SOB [Y ]; Orthopnea [ ] ; Pedal Edema [ ] ; Palpitations [ ] ; Syncope [ ] ; Presyncope [ ] ; Paroxysmal nocturnal dyspnea[ ]   Pulmonary: Cough [ ] ; Wheezing[ ] ; Hemoptysis[ ] ; Sputum [ ] ; Snoring [ ]   GI: Vomiting[ ] ; Dysphagia[ ] ; Melena[ ] ; Hematochezia [ ] ; Heartburn[ ] ; Abdominal pain [ ] ; Constipation [ ] ; Diarrhea [ ] ; BRBPR [ ]   GU: Hematuria[ ] ; Dysuria [ ] ; Nocturia[ ]   Vascular: Pain in legs with walking [ ] ; Pain in feet with lying flat [ ] ; Non-healing sores [ ] ; Stroke [Y ]; TIA [ ] ; Slurred speech [ ] ;  Neuro: Headaches[ ] ; Vertigo[ ] ; Seizures[ ] ; Paresthesias[ ] ;Blurred vision [ ] ; Diplopia [ ] ; Vision changes [ ]   Ortho/Skin: Arthritis [ ] ; Joint pain [Y ]; Muscle pain [ ] ; Joint swelling [ ] ; Back Pain [ ] ; Rash [ ]   Psych: Depression[ ] ; Anxiety[ ]   Heme: Bleeding problems [ ] ;  Clotting disorders [ ] ; Anemia [ ]   Endocrine: Diabetes [ Y]; Thyroid dysfunction[ ]   Home Medications Prior to Admission medications   Medication Sig Start Date End Date Taking? Authorizing Provider  Acetaminophen 650 MG TABS Take 1 tablet (650 mg total) by mouth 3 (three) times daily as needed. 12/19/12   Moreno-Coll, Adlih, MD  aspirin 81 MG tablet Take 81 mg by mouth daily.    [provider]  Blood Glucose Calibration (ACCU-CHEK GUIDE CONTROL) LIQD Use as directed 05/07/20   Dorothyann Peng, NP  Blood Glucose Monitoring Suppl (ACCU-CHEK AVIVA PLUS) w/Device KIT Use to check blood sugar 3 times a day 08/15/19   Philemon Kingdom, MD  Blood Glucose Monitoring Suppl (ACCU-CHEK GUIDE) w/Device KIT Use as directed 05/07/20   Dorothyann Peng, NP  calcium-vitamin D (OSCAL WITH D 500-200) 500-200 MG-UNIT per tablet Take 1 tablet by mouth daily.    [provider]  clopidogrel (PLAVIX) 75 MG tablet Take 1 tablet (75 mg total) by mouth daily. 09/12/21   Virl Axe, MD  Continuous Blood Gluc Sensor (FREESTYLE LIBRE 14 DAY SENSOR) MISC Use to check blood glucose 3 times a day 09/30/20   Philemon Kingdom, MD  Dulaglutide (TRULICITY) 3 IO/9.6EX SOPN Inject 3 mg into the skin  once a week. 06/11/21   Philemon Kingdom, MD  fluticasone (FLONASE) 50 MCG/ACT nasal spray Place 1 spray into the nose daily. 12/19/12   Moreno-Coll, Adlih, MD  Glucagon 3 MG/DOSE POWD Place 3 mg into the nose once as needed for up to 1 dose. 09/11/20   Philemon Kingdom, MD  glucose blood (ACCU-CHEK AVIVA PLUS) test strip Use to check blood sugar 3 times a day. 08/15/19   Philemon Kingdom, MD  glucose blood (ACCU-CHEK GUIDE) test strip CHECK BLOOD SUGAR THREE TIMES DAILY 09/02/20   Philemon Kingdom, MD  insulin NPH Human (NOVOLIN N) 100 UNIT/ML injection Inject 0.32 mLs (32 Units total) into the skin daily before breakfast. Pens please 09/21/21   Philemon Kingdom, MD  Insulin Pen Needle (BD PEN NEEDLE NANO U/F) 32G X 4 MM  MISC USE TO INJECT INSULIN 4 TIMES DAILY AS DIRECTED. 08/15/19   Philemon Kingdom, MD  Lancets [facility] ULTRASOFT) lancets Use as instructed to test 3 times daily 08/15/19   Philemon Kingdom, MD  memantine (NAMENDA) 5 MG tablet Take 1 tablet (5 mg total) by mouth daily. 12/22/21   Rondel Jumbo, PA-C  Multiple Vitamin (MULTIVITAMIN) tablet Take 1 tablet by mouth daily.    [provider]  NOVOLOG FLEXPEN 100 UNIT/ML FlexPen Inject under skin 5-8 units 3x a day before meals 09/21/21   Philemon Kingdom, MD  simvastatin (ZOCOR) 40 MG tablet TAKE 1 TABLET EVERY DAY Patient taking differently: Take 40 mg by mouth daily at 6 PM. 03/10/21   Dorothyann Peng, NP    Past Medical History: Past Medical History:  Diagnosis Date   Asthma    DEGENERATIVE JOINT DISEASE, GENERALIZED 02/01/2007   Qualifier: Diagnosis of  By: Burnice Logan  MD, Doretha Sou    DIARRHEA, CHRONIC 08/20/2009   Qualifier: Diagnosis of  By: Burnice Logan  MD, Doretha Sou    DM 02/01/2007   Qualifier: Diagnosis of  By: Floyde Parkins     FATIGUE 10/20/2008   Qualifier: Diagnosis of  By: Burnice Logan  MD, Doretha Sou    HYPERCHOLESTEROLEMIA 02/01/2007   Qualifier: Diagnosis of  By: Floyde Parkins     HYPERLIPIDEMIA 12/20/2007   Qualifier: Diagnosis of  By: Burnice Logan  MD, Doretha Sou    HYPERTENSION 02/01/2007   Qualifier: Diagnosis of  By: Russella Dar BACK PAIN 01/13/2010   Qualifier: Diagnosis of  By: Burnice Logan  MD, Doretha Sou    MUSCLE CRAMPS 04/06/2010   Qualifier: Diagnosis of  By: Burnice Logan  MD, Doretha Sou    MYALGIA 01/20/2010   Qualifier: Diagnosis of  By: Elease Hashimoto MD, Bruce     OSTEOARTHRITIS 01/13/2010   Qualifier: Diagnosis of  By: Burnice Logan  MD, Doretha Sou    OVERACTIVE BLADDER 08/20/2009   Qualifier: Diagnosis of  By: Burnice Logan  MD, Doretha Sou    PLEURISY 06/18/2008   Qualifier: Diagnosis of  By: Niel Hummer MD, Lorinda Creed    PNEUMONIA 06/18/2008   Qualifier: Diagnosis of  By: Niel Hummer MD, Lorinda Creed    Rectal prolapse 09/20/2007    Qualifier: Diagnosis of  By: Burnice Logan  MD, Doretha Sou    VITAMIN B12 DEFICIENCY 07/20/2010   Qualifier: Diagnosis of  By: Carlean Purl MD, Dimas Millin     Past Surgical History: Past Surgical History:  Procedure Laterality Date   ABDOMINAL HYSTERECTOMY     partial- states she has her ovaries   APPENDECTOMY     BLADDER SURGERY     CATARACT EXTRACTION Bilateral  2012   CHOLECYSTECTOMY     CORONARY STENT INTERVENTION N/A 08/15/2022   Procedure: CORONARY STENT INTERVENTION;  Surgeon: Martinique, Peter M, MD;  Location: [place] [facility];  Service: Cardiovascular;  Laterality: N/A;   CORONARY/GRAFT ACUTE MI REVASCULARIZATION N/A 08/20/2022   Procedure: Coronary/Graft Acute MI Revascularization;  Surgeon: Martinique, Peter M, MD;  Location: [place] [facility];  Service: Cardiovascular;  Laterality: N/A;   IABP INSERTION N/A 08/04/2022   Procedure: IABP Insertion;  Surgeon: Martinique, Peter M, MD;  Location: [place] [facility];  Service: Cardiovascular;  Laterality: N/A;   LEFT HEART CATH AND CORONARY ANGIOGRAPHY N/A 08/30/2022   Procedure: LEFT HEART CATH AND CORONARY ANGIOGRAPHY;  Surgeon: Martinique, Peter M, MD;  Location: [place] [facility];  Service: Cardiovascular;  Laterality: N/A;    Family History: Family History  Problem Relation Age of Onset   Deep vein thrombosis Mother    COPD Father    Asthma Father    Prostate cancer Brother    Heart attack Brother    Heart attack Maternal Grandfather    Diabetes Neg Hx    Heart disease Neg Hx    Stroke Neg Hx     Social History: Social History   Socioeconomic History   Marital status: Single    Spouse name: Not on file   Number of children: Not on file   Years of education: Not on file   Highest education level: Not on file  Occupational History   Not on file  Tobacco Use   Smoking status: Former    Packs/day: 10.00    Types: Cigarettes   Smokeless tobacco: Never   Tobacco comments:    quit > 88 yo   Vaping Use   Vaping Use: Never used   Substance and Sexual Activity   Alcohol use: No   Drug use: No   Sexual activity: Not on file  Other Topics Concern   Not on file  Social History Narrative   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    Married for 33 years, widowed for 22 years    Right handed    Lives alone   Social Determinants of Health   Financial Resource Strain: Low Risk  (08/09/2021)   Overall Financial Resource Strain (CARDIA)    Difficulty of Paying Living Expenses: Not hard at all  Food Insecurity: No Food Insecurity (08/09/2021)   Hunger Vital Sign    Worried About Running Out of Food in the Last Year: Never true    Ran Out of Food in the Last Year: Never true  Transportation Needs: No Transportation Needs (08/09/2021)   PRAPARE - Hydrologist (Medical): No    Lack of Transportation (Non-Medical): No  Physical Activity: Inactive (08/09/2021)   Exercise Vital Sign    Days of Exercise per Week: 0 days    Minutes of Exercise per Session: 0 min  Stress: No Stress Concern Present (08/09/2021)   [place]    Feeling of Stress : Not at all  Social Connections: Moderately Integrated (08/09/2021)   Social Connection and Isolation Panel [NHANES]    Frequency of Communication with Friends and Family: More than three times a week    Frequency of Social Gatherings with Friends and Family: More than three times a week    Attends Religious Services: 1 to 4 times per year    Active Member of Clubs or Organizations: Yes    Attends  Club or Organization Meetings: 1 to 4 times per year    Marital Status: Widowed    Allergies:  Allergies  Allergen Reactions   Metformin Diarrhea   Sitagliptin Phosphate Other (See Comments)    abdominal pain    Objective:    Vital Signs:   Temp:  [97.3 F (36.3 C)] 97.3 F (36.3 C) (02/02 1300) Pulse Rate:  [0-126] 81 (02/02 1415) Resp:  [14-48] 14 (02/02 1415) BP: (50-129)/(24-83) 99/60 (02/02 1415) SpO2:   [56 %-99 %] 93 % (02/02 1415) Arterial Line BP: (95-121)/(32-46) 95/36 (02/02 1415) Weight:  [71.7 kg] 71.7 kg (02/02 1051)    Weight change: Filed Weights   08/14/2022 1051  Weight: 71.7 kg    Intake/Output:   Intake/Output Summary (Last 24 hours) at 08/24/2022 1513 Last data filed at 08/28/2022 1400 Gross per 24 hour  Intake 332.65 ml  Output 200 ml  Net 132.65 ml      Physical Exam    General:  Elderly No resp difficulty HEENT: normal Neck: supple. JVP to jaw. Carotids 2+ bilat; no bruits. No lymphadenopathy or thyromegaly appreciated. Cor: PMI nondisplaced. Regular rate & rhythm. No rubs, gallops or murmurs. Lungs: clear Abdomen: soft, nontender, nondistended. No hepatosplenomegaly. No bruits or masses. Good bowel sounds. Extremities: no cyanosis, clubbing, rash, edema. LFV IABP R Femoral A line.  Neuro: alert & orientedx3, cranial nerves grossly intact. moves all 4 extremities w/o difficulty. Affect pleasant   Telemetry   SR with occasional PVCs   EKG   Post cath SR with RBBB  Labs   Basic Metabolic Panel: Recent Labs  Lab 08/25/2022 1024 08/22/2022 1050  NA 132* 133*  K 6.2* 6.3*  CL 103 107  CO2 15*  --   GLUCOSE 299* 299*  BUN 40* 35*  CREATININE 1.62* 1.60*  CALCIUM 9.5  --     Liver Function Tests: Recent Labs  Lab 08/20/2022 1024  AST 28  ALT 12  ALKPHOS 33*  BILITOT 0.3  PROT 5.5*  ALBUMIN 2.6*   No results for input(s): "LIPASE", "AMYLASE" in the last 168 hours. No results for input(s): "AMMONIA" in the last 168 hours.  CBC: Recent Labs  Lab 08/13/2022 1024 08/04/2022 1050  WBC 11.3*  --   HGB 10.1* 9.9*  HCT 30.2* 29.0*  MCV 90.4  --   PLT 300  --     Cardiac Enzymes: No results for input(s): "CKTOTAL", "CKMB", "CKMBINDEX", "TROPONINI" in the last 168 hours.  BNP: BNP (last 3 results) No results for input(s): "BNP" in the last 8760 hours.  ProBNP (last 3 results) No results for input(s): "PROBNP" in the last 8760  hours.   CBG: Recent Labs  Lab 08/04/2022 1308  GLUCAP 287*    Coagulation Studies: Recent Labs    08/04/2022 1024  LABPROT 14.7  INR 1.2     Imaging   ECHOCARDIOGRAM COMPLETE  Result Date: 08/30/2022    ECHOCARDIOGRAM REPORT   Patient Name:   [name] [name] Date of Exam: 08/31/2022 Medical Rec #:  [id number]          Height:       62.0 in Accession #:    [id number]         Weight:       158.1 lb Date of Birth:  09/17/1928         BSA:          1.730 m Patient Age:    50 years  BP:           87/55 mmHg Patient Gender: F                  HR:           63 bpm. Exam Location:  Inpatient Procedure: 2D Echo, Color Doppler and Cardiac Doppler Indications:    STEMI  History:        Patient has prior history of Echocardiogram examinations, most                 recent 09/10/2021. CAD; Risk Factors:Hypertension, Diabetes and                 Dyslipidemia.  Sonographer:    Raquel Sarna Senior RDCS Referring Phys: [id number] PETER M Martinique  Sonographer Comments: On IABP at time of study IMPRESSIONS  1. Left ventricular ejection fraction, by estimation, is 60 to 65%. The left ventricle has normal function. The left ventricle has no regional wall motion abnormalities. There is mild concentric left ventricular hypertrophy.  2. Right ventricular systolic function is normal. The right ventricular size is normal.  3. Moderate pericardial effusion. The pericardial effusion is localized near the right ventricle. There is no evidence of cardiac tamponade.  4. No evidence of mitral valve regurgitation.  5. There is moderate calcification of the aortic valve. Aortic valve regurgitation is trivial.  6. There is mild dilatation of the ascending aorta, measuring 39 mm.  7. The inferior vena cava is dilated in size with >50% respiratory variability, suggesting right atrial pressure of 8 mmHg. Comparison(s): No significant change from prior study. FINDINGS  Left Ventricle: Left ventricular ejection fraction, by estimation, is 60  to 65%. The left ventricle has normal function. The left ventricle has no regional wall motion abnormalities. The left ventricular internal cavity size was normal in size. There is  mild concentric left ventricular hypertrophy. Right Ventricle: The right ventricular size is normal. Right ventricular systolic function is normal. Left Atrium: Left atrial size was normal in size. Right Atrium: Right atrial size was normal in size. Pericardium: A moderately sized pericardial effusion is present. The pericardial effusion is localized near the right ventricle. There is no evidence of cardiac tamponade. Mitral Valve: Mild to moderate mitral annular calcification. No evidence of mitral valve regurgitation. Tricuspid Valve: Tricuspid valve regurgitation is not demonstrated. Aortic Valve: There is moderate calcification of the aortic valve. Aortic valve regurgitation is trivial. Pulmonic Valve: Pulmonic valve regurgitation is not visualized. Aorta: There is mild dilatation of the ascending aorta, measuring 39 mm. Venous: The inferior vena cava is dilated in size with greater than 50% respiratory variability, suggesting right atrial pressure of 8 mmHg. IAS/Shunts: No atrial level shunt detected by color flow Doppler.  LEFT VENTRICLE PLAX 2D LVIDd:         3.80 cm LVIDs:         2.30 cm LV PW:         1.00 cm LV IVS:        1.20 cm LVOT diam:     2.00 cm LV SV:         77 LV SV Index:   45 LVOT Area:     3.14 cm  LV Volumes (MOD) LV vol d, MOD A2C: 42.2 ml LV vol d, MOD A4C: 56.1 ml LV vol s, MOD A2C: 17.2 ml LV vol s, MOD A4C: 17.4 ml LV SV MOD A2C:     25.0 ml LV SV MOD A4C:  56.1 ml LV SV MOD BP:      31.2 ml RIGHT VENTRICLE RV S prime:     12.80 cm/s TAPSE (M-mode): 1.9 cm LEFT ATRIUM             Index        RIGHT ATRIUM           Index LA diam:        3.80 cm 2.20 cm/m   RA Area:     11.70 cm LA Vol (A2C):   28.6 ml 16.53 ml/m  RA Volume:   21.70 ml  12.54 ml/m LA Vol (A4C):   52.7 ml 30.47 ml/m LA Biplane Vol:  40.0 ml 23.12 ml/m  AORTIC VALVE LVOT Vmax:   153.00 cm/s LVOT Vmean:  108.000 cm/s LVOT VTI:    0.246 m  AORTA Ao Root diam: 2.80 cm Ao Asc diam:  3.90 cm  SHUNTS Systemic VTI:  0.25 m Systemic Diam: 2.00 cm Phineas Inches Electronically signed by Phineas Inches Signature Date/Time: 08/08/2022/3:10:30 PM    Final    CARDIAC CATHETERIZATION  Result Date: 08/30/2022   Ost LAD to Prox LAD lesion is 100% stenosed.   Mid Cx lesion is 90% stenosed.   A drug-eluting stent was successfully placed using a SYNERGY XD 3.0X38.   Post intervention, there is a 0% residual stenosis.   LV end diastolic pressure is moderately elevated. Acute occlusion of the proximal LAD which is the culprit lesion. 90% mid LCx Unable to engage the RCA Moderately elevated LVEDP 30 mm Hg Cardiogenic shock. IABP placed, on IV Levophed Successful PCI of the proximal LAD with DES. Plan: will continue IV heparin per pharmacy since IABP placed. Plan to load with oral Brinta. Once done can discontinue IV Kangrelor. Needs DAPT for one year. If patient recovers from acute infarct would consider bringing her back for staged PCI of the LCx and attempt to better image the RCA.     Medications:     Current Medications:  [START ON 22-Aug-2022] aspirin EC  81 mg Oral Daily   atorvastatin  40 mg Oral Daily   insulin aspart  0-15 Units Subcutaneous TID WC   pantoprazole  40 mg Oral Daily   sodium chloride flush  3 mL Intravenous Q12H   ticagrelor  90 mg Oral BID    Infusions:  sodium chloride     amiodarone 60 mg/hr (08/29/2022 1400)   amiodarone     heparin     norepinephrine (LEVOPHED) Adult infusion 12 mcg/min (08/22/2022 1407)      Patient Profile   [name] [name] is a 89 year old with a history of dementia, HTN, DMII, HLD, CKD Stage III, and stroke. No previous cardiac history.    Assessment/Plan   1. STEMI, Anterior -->complicated by cardiogenic shock Hypotensive on arrival with ST elevation anterior leads. Take urgently to the cath lab  acute occlusion proximal LAD and 90% LCx. S/P PCI to LAD. LVEDP 30.  - Lactic acid 3.6  -HS Trop 192 > 2nd set pending IABP placed. Started on norepi to support BP. Lactic acid pending.  Start 40 mg IV lasix twice a day.  - Loaded on Brillinta.+ statin+  -ECHO50-55% with WMA.  - Will need central access.   2. NSVT  Started on amio drip after cath.   3. Dementia   4. DMII Place on SSI   5. Hyperkalemia K 6.2 on admit. Repeat 6.1  Give 10 Lokelma now.     Length  of Stay: 0  Darrick Grinder, NP  08/24/2022, 3:13 PM  Advanced Heart Failure Team Pager [phone] (M-F; 7a - 5p)  Please contact [facility] for night-coverage after hours (4p -7a ) and weekends on [email or web address]   Agree with above.  89 y/o woman with DM2, mild dementia admitted with anterior STEMI. Now s/p PCI/DES of LAD with residual 80% OM. Post PCI course c/b shock an VT. IABP placed and NE/amio started  Currently denies CP or SOB.   General:  Elderly woman lying flat in bed  No resp difficulty HEENT: normal Neck: supple. no JVP 7 Carotids 2+ bilat; no bruits. No lymphadenopathy or thryomegaly appreciated. Cor: PMI nondisplaced. Regular rate & rhythm. No rubs, gallops or murmurs. Lungs: clear Abdomen: soft, nontender, nondistended. No hepatosplenomegaly. No bruits or masses. Good bowel sounds. Extremities: no cyanosis, clubbing, rash, edema LFA IABP site ok  Neuro: alert & orientedx3, cranial nerves grossly intact. moves all 4 extremities w/o difficulty. Affect pleasant  She has been stabilized with IABP and NE. Will continue support. Place central access. Wean NE as tolerated based on BP and co-ox. She desires full code.  Echo reviewed EF probably 50% with anteroseptal HK. No significant valvular disease of mechanical complications.   CRITICAL CARE Performed by: Glori Bickers  Total critical care time: 50 minutes  Critical care time was exclusive of separately billable procedures and treating other  patients.  Critical care was necessary to treat or prevent imminent or life-threatening deterioration.  Critical care was time spent personally by me (independent of midlevel providers or residents) on the following activities: development of treatment plan with patient and/or surrogate as well as nursing, discussions with consultants, evaluation of patient's response to treatment, examination of patient, obtaining history from patient or surrogate, ordering and performing treatments and interventions, ordering and review of laboratory studies, ordering and review of radiographic studies, pulse oximetry and re-evaluation of patient's condition.  Glori Bickers, MD  4:44 PM

## 2022-08-05 NOTE — Progress Notes (Signed)
   Notified by RN that patient was having runs of NSVT after cardiac catheterization. Will start IV Amiodarone. CMET pending. Will also check Magnesium.  Darreld Mclean, PA-C 08/07/2022 12:52 PM

## 2022-08-05 NOTE — Progress Notes (Signed)
ANTICOAGULATION CONSULT NOTE - Initial Consult  Pharmacy Consult for IV heparin  Indication: IABP  Allergies  Allergen Reactions   Metformin Diarrhea   Sitagliptin Phosphate Other (See Comments)    abdominal pain    Patient Measurements: Height: 5\' 2"  (157.5 cm) Weight: 71.7 kg (158 lb 1.1 oz) (from 12/23) IBW/kg (Calculated) : 50.1 Heparin Dosing Weight: ~ 65 kg  Vital Signs:    Labs: Recent Labs    08/04/2022 1024  HGB 10.1*  HCT 30.2*  PLT 300  APTT <20*  LABPROT 14.7  INR 1.2  CREATININE 1.62*  TROPONINIHS 192*    Estimated Creatinine Clearance: 21 mL/min (A) (by C-G formula based on SCr of 1.62 mg/dL (H)).   Medical History: Past Medical History:  Diagnosis Date   Asthma    DEGENERATIVE JOINT DISEASE, GENERALIZED 02/01/2007   Qualifier: Diagnosis of  By: Burnice Logan  MD, Doretha Sou    DIARRHEA, CHRONIC 08/20/2009   Qualifier: Diagnosis of  By: Burnice Logan  MD, Doretha Sou    DM 02/01/2007   Qualifier: Diagnosis of  By: Floyde Parkins     FATIGUE 10/20/2008   Qualifier: Diagnosis of  By: Burnice Logan  MD, Doretha Sou    HYPERCHOLESTEROLEMIA 02/01/2007   Qualifier: Diagnosis of  By: Mariam Dollar 12/20/2007   Qualifier: Diagnosis of  By: Burnice Logan  MD, Doretha Sou    HYPERTENSION 02/01/2007   Qualifier: Diagnosis of  By: Russella Dar BACK PAIN 01/13/2010   Qualifier: Diagnosis of  By: Burnice Logan  MD, Doretha Sou    MUSCLE CRAMPS 04/06/2010   Qualifier: Diagnosis of  By: Burnice Logan  MD, Doretha Sou    MYALGIA 01/20/2010   Qualifier: Diagnosis of  By: Elease Hashimoto MD, Bruce     OSTEOARTHRITIS 01/13/2010   Qualifier: Diagnosis of  By: Burnice Logan  MD, Doretha Sou    OVERACTIVE BLADDER 08/20/2009   Qualifier: Diagnosis of  By: Burnice Logan  MD, Doretha Sou    PLEURISY 06/18/2008   Qualifier: Diagnosis of  By: Niel Hummer MD, Lorinda Creed    PNEUMONIA 06/18/2008   Qualifier: Diagnosis of  By: Niel Hummer MD, Lorinda Creed    Rectal prolapse 09/20/2007   Qualifier: Diagnosis of  By:  Burnice Logan  MD, Doretha Sou    VITAMIN B12 DEFICIENCY 07/20/2010   Qualifier: Diagnosis of  By: Carlean Purl MD, Dimas Millin     Medications:  Infusions:   sodium chloride     amiodarone     amiodarone     cangrelor (KENGREAL) 50 mg in sodium chloride 0.9 % 250 mL (0.2 mg/mL) infusion     heparin     norepinephrine (LEVOPHED) Adult infusion      Assessment: 89 yo female s/p MI now on IABP. Pharmacy asked to begin IV heparin for anticoagulation.  No known history of bleeding, no anticoagulants PTA per my review.  Goal of Therapy:  Heparin level 0.2-0.5 while on IABP Monitor platelets by anticoagulation protocol: Yes   Plan:  Start IV heparin now at 700 units/hr with no bolus. Check heparin level 8 hrs after gtt starts. Daily heparin level and CBC.  [place] Crane, Roylene Reason, BCCP Clinical Pharmacist  08/08/2022 1:37 PM   [facility] pharmacy phone numbers are listed on [email or web address]

## 2022-08-06 DIAGNOSIS — E872 Acidosis, unspecified: Secondary | ICD-10-CM

## 2022-08-06 DIAGNOSIS — I2102 ST elevation (STEMI) myocardial infarction involving left anterior descending coronary artery: Secondary | ICD-10-CM | POA: Diagnosis not present

## 2022-08-06 LAB — CBC WITH DIFFERENTIAL/PLATELET
Abs Immature Granulocytes: 0.15 10*3/uL — ABNORMAL HIGH (ref 0.00–0.07)
Basophils Absolute: 0.1 10*3/uL (ref 0.0–0.1)
Basophils Relative: 0 %
Eosinophils Absolute: 0 10*3/uL (ref 0.0–0.5)
Eosinophils Relative: 0 %
HCT: 30.1 % — ABNORMAL LOW (ref 36.0–46.0)
Hemoglobin: 9.7 g/dL — ABNORMAL LOW (ref 12.0–15.0)
Immature Granulocytes: 1 %
Lymphocytes Relative: 11 %
Lymphs Abs: 1.8 10*3/uL (ref 0.7–4.0)
MCH: 30 pg (ref 26.0–34.0)
MCHC: 32.2 g/dL (ref 30.0–36.0)
MCV: 93.2 fL (ref 80.0–100.0)
Monocytes Absolute: 2.3 10*3/uL — ABNORMAL HIGH (ref 0.1–1.0)
Monocytes Relative: 14 %
Neutro Abs: 12.8 10*3/uL — ABNORMAL HIGH (ref 1.7–7.7)
Neutrophils Relative %: 74 %
Platelets: 306 10*3/uL (ref 150–400)
RBC: 3.23 MIL/uL — ABNORMAL LOW (ref 3.87–5.11)
RDW: 13.1 % (ref 11.5–15.5)
WBC: 17.2 10*3/uL — ABNORMAL HIGH (ref 4.0–10.5)
nRBC: 0 % (ref 0.0–0.2)

## 2022-08-06 LAB — COMPREHENSIVE METABOLIC PANEL
ALT: 117 U/L — ABNORMAL HIGH (ref 0–44)
ALT: 118 U/L — ABNORMAL HIGH (ref 0–44)
AST: 455 U/L — ABNORMAL HIGH (ref 15–41)
AST: 414 U/L — ABNORMAL HIGH (ref 15–41)
Albumin: 2.6 g/dL — ABNORMAL LOW (ref 3.5–5.0)
Albumin: 2.4 g/dL — ABNORMAL LOW (ref 3.5–5.0)
Alkaline Phosphatase: 40 U/L (ref 38–126)
Alkaline Phosphatase: 41 U/L (ref 38–126)
Anion gap: 13 (ref 5–15)
Anion gap: 15 (ref 5–15)
BUN: 44 mg/dL — ABNORMAL HIGH (ref 8–23)
BUN: 45 mg/dL — ABNORMAL HIGH (ref 8–23)
CO2: 17 mmol/L — ABNORMAL LOW (ref 22–32)
CO2: 16 mmol/L — ABNORMAL LOW (ref 22–32)
Calcium: 9.5 mg/dL (ref 8.9–10.3)
Calcium: 9.8 mg/dL (ref 8.9–10.3)
Chloride: 101 mmol/L (ref 98–111)
Chloride: 99 mmol/L (ref 98–111)
Creatinine, Ser: 2.23 mg/dL — ABNORMAL HIGH (ref 0.44–1.00)
Creatinine, Ser: 2.59 mg/dL — ABNORMAL HIGH (ref 0.44–1.00)
GFR, Estimated: 20 mL/min — ABNORMAL LOW (ref >60)
GFR, Estimated: 17 mL/min — ABNORMAL LOW (ref >60)
Glucose, Bld: 285 mg/dL — ABNORMAL HIGH (ref 70–99)
Glucose, Bld: 305 mg/dL — ABNORMAL HIGH (ref 70–99)
Potassium: 6.5 mmol/L (ref 3.5–5.1)
Potassium: 5.8 mmol/L — ABNORMAL HIGH (ref 3.5–5.1)
Sodium: 131 mmol/L — ABNORMAL LOW (ref 135–145)
Sodium: 130 mmol/L — ABNORMAL LOW (ref 135–145)
Total Bilirubin: 0.1 mg/dL — ABNORMAL LOW (ref 0.3–1.2)
Total Bilirubin: 0.2 mg/dL — ABNORMAL LOW (ref 0.3–1.2)
Total Protein: 5.7 g/dL — ABNORMAL LOW (ref 6.5–8.1)
Total Protein: 5.6 g/dL — ABNORMAL LOW (ref 6.5–8.1)

## 2022-08-06 LAB — PHOSPHORUS
Phosphorus: 5.1 mg/dL — ABNORMAL HIGH (ref 2.5–4.6)
Phosphorus: 5.3 mg/dL — ABNORMAL HIGH (ref 2.5–4.6)

## 2022-08-06 LAB — GLUCOSE, CAPILLARY
Glucose-Capillary: 258 mg/dL — ABNORMAL HIGH (ref 70–99)
Glucose-Capillary: 235 mg/dL — ABNORMAL HIGH (ref 70–99)

## 2022-08-06 LAB — POCT I-STAT 7, (LYTES, BLD GAS, ICA,H+H)
Acid-base deficit: 10 mmol/L — ABNORMAL HIGH (ref 0.0–2.0)
Bicarbonate: 16 mmol/L — ABNORMAL LOW (ref 20.0–28.0)
Calcium, Ion: 1.43 mmol/L — ABNORMAL HIGH (ref 1.15–1.40)
HCT: 30 % — ABNORMAL LOW (ref 36.0–46.0)
Hemoglobin: 10.2 g/dL — ABNORMAL LOW (ref 12.0–15.0)
O2 Saturation: 96 %
Patient temperature: 97.7
Potassium: 6.2 mmol/L — ABNORMAL HIGH (ref 3.5–5.1)
Sodium: 130 mmol/L — ABNORMAL LOW (ref 135–145)
TCO2: 17 mmol/L — ABNORMAL LOW (ref 22–32)
pCO2 arterial: 32.8 mmHg (ref 32–48)
pH, Arterial: 7.293 — ABNORMAL LOW (ref 7.35–7.45)
pO2, Arterial: 88 mmHg (ref 83–108)

## 2022-08-06 LAB — LACTIC ACID, PLASMA: Lactic Acid, Venous: 4.9 mmol/L (ref 0.5–1.9)

## 2022-08-06 LAB — COOXEMETRY PANEL
Carboxyhemoglobin: 1 % (ref 0.5–1.5)
Methemoglobin: <0.7 % (ref 0.0–1.5)
O2 Saturation: 62.3 %
Total hemoglobin: 8.8 g/dL — ABNORMAL LOW (ref 12.0–16.0)

## 2022-08-06 LAB — MAGNESIUM
Magnesium: 2.3 mg/dL (ref 1.7–2.4)
Magnesium: 2.3 mg/dL (ref 1.7–2.4)

## 2022-08-06 MED ORDER — ONDANSETRON HCL 4 MG/2ML IJ SOLN: 4.0000 mg | Freq: Four times a day (QID) | INTRAMUSCULAR | Status: DC | PRN

## 2022-08-06 MED ORDER — ONDANSETRON 4 MG PO TBDP: 4.0000 mg | ORAL_TABLET | Freq: Four times a day (QID) | ORAL | Status: DC | PRN

## 2022-08-06 MED ORDER — SODIUM BICARBONATE 8.4 % IV SOLN
Freq: Once | INTRAVENOUS | Status: AC
  Filled 2022-08-06: qty 1000

## 2022-08-06 MED ORDER — NOREPINEPHRINE 16 MG/250ML-% IV SOLN
0.0000 ug/min | INTRAVENOUS | Status: DC
  Administered 2022-08-06: 22 ug/min via INTRAVENOUS
  Filled 2022-08-06: qty 250

## 2022-08-06 MED ORDER — MORPHINE 100MG IN NS 100ML (1MG/ML) PREMIX INFUSION
5.0000 mg/h | INTRAVENOUS | Status: DC
  Administered 2022-08-06: 5 mg/h via INTRAVENOUS
  Filled 2022-08-06: qty 100

## 2022-08-06 MED ORDER — GLYCOPYRROLATE 0.2 MG/ML IJ SOLN: 0.2000 mg | INTRAMUSCULAR | Status: DC | PRN

## 2022-08-06 MED ORDER — CALCIUM GLUCONATE-NACL 1-0.675 GM/50ML-% IV SOLN
1.0000 g | Freq: Once | INTRAVENOUS | Status: AC
  Administered 2022-08-06: 1000 mg via INTRAVENOUS
  Filled 2022-08-06: qty 50

## 2022-08-06 MED ORDER — DEXMEDETOMIDINE HCL IN NACL 400 MCG/100ML IV SOLN
INTRAVENOUS | Status: AC
  Administered 2022-08-06: 0.4 ug/kg/h via INTRAVENOUS
  Filled 2022-08-06: qty 100

## 2022-08-06 MED ORDER — MORPHINE BOLUS VIA INFUSION
5.0000 mg | Freq: Once | INTRAVENOUS | Status: DC
  Filled 2022-08-06: qty 5

## 2022-08-06 MED ORDER — DEXTROSE 50 % IV SOLN
1.0000 | Freq: Once | INTRAVENOUS | Status: AC
  Administered 2022-08-06: 50 mL via INTRAVENOUS
  Filled 2022-08-06: qty 50

## 2022-08-06 MED ORDER — SODIUM BICARBONATE 8.4 % IV SOLN
INTRAVENOUS | Status: AC
  Administered 2022-08-06: 100 meq via INTRAVENOUS
  Filled 2022-08-06: qty 100

## 2022-08-06 MED ORDER — ACETAMINOPHEN 650 MG RE SUPP: 650.0000 mg | Freq: Four times a day (QID) | RECTAL | Status: DC | PRN

## 2022-08-06 MED ORDER — POLYVINYL ALCOHOL 1.4 % OP SOLN: 1.0000 [drp] | Freq: Four times a day (QID) | OPHTHALMIC | Status: DC | PRN

## 2022-08-06 MED ORDER — SODIUM CHLORIDE 0.9 % IV SOLN: INTRAVENOUS | Status: DC

## 2022-08-06 MED ORDER — INSULIN ASPART 100 UNIT/ML IV SOLN
10.0000 [IU] | Freq: Once | INTRAVENOUS | Status: AC
  Administered 2022-08-06: 10 [IU] via INTRAVENOUS

## 2022-08-06 MED ORDER — GLYCOPYRROLATE 1 MG PO TABS: 1.0000 mg | ORAL_TABLET | ORAL | Status: DC | PRN

## 2022-08-06 MED ORDER — MORPHINE SULFATE (PF) 2 MG/ML IV SOLN
2.0000 mg | INTRAVENOUS | Status: DC | PRN
  Administered 2022-08-06 (×2): 2 mg via INTRAVENOUS
  Filled 2022-08-06 (×2): qty 1

## 2022-08-06 MED ORDER — ACETAMINOPHEN 325 MG PO TABS: 650.0000 mg | ORAL_TABLET | Freq: Four times a day (QID) | ORAL | Status: DC | PRN

## 2022-08-06 MED ORDER — CHLORHEXIDINE GLUCONATE CLOTH 2 % EX PADS: 6.0000 | MEDICATED_PAD | Freq: Every day | CUTANEOUS | Status: DC

## 2022-08-06 MED ORDER — VASOPRESSIN 20 UNITS/100 ML INFUSION FOR SHOCK
INTRAVENOUS | Status: AC
  Administered 2022-08-06: 0.02 [IU]/min via INTRAVENOUS
  Filled 2022-08-06: qty 100

## 2022-08-06 MED ORDER — SODIUM BICARBONATE 8.4 % IV SOLN: 100.0000 meq | Freq: Once | INTRAVENOUS | Status: AC

## 2022-08-06 MED ORDER — VASOPRESSIN 20 UNITS/100 ML INFUSION FOR SHOCK: 0.0000 [IU]/min | INTRAVENOUS | Status: DC

## 2022-08-06 MED ORDER — MIDAZOLAM HCL 2 MG/2ML IJ SOLN: 2.0000 mg | INTRAMUSCULAR | Status: DC | PRN

## 2022-08-06 MED ORDER — DEXMEDETOMIDINE HCL IN NACL 400 MCG/100ML IV SOLN: 0.0000 ug/kg/h | INTRAVENOUS | Status: DC

## 2022-08-09 ENCOUNTER — Telehealth (HOSPITAL_COMMUNITY): Payer: Self-pay

## 2022-08-09 NOTE — Telephone Encounter (Signed)
[name] [name] from [employer] [facility] called and stated you are listed as physician to sign her death certificate online. It is set up for you to do.  Do you need additional information?

## 2022-08-10 MED FILL — Verapamil HCl IV Soln 2.5 MG/ML: INTRAVENOUS | Qty: 2 | Status: AC

## 2022-09-02 NOTE — Consult Note (Signed)
NAME:  [name] [name], MRN:  [id number], DOB:  06-11-1929, LOS: 1 ADMISSION DATE:  08/27/2022, CONSULTATION DATE:  August 12, 2022 REFERRING MD:  Dr Martinique, REASON FOR CONSULT:  metabolic disarray, acidosis  History of Present Illness:  89 year old woman with dementia, hypertension, diabetes type 2, hyperlipidemia, CKD stage IIIb, prior CVA.  She was admitted with chest pain and STEMI 08/21/2022.  Underwent PTCI to the LAD, IABP placement.  She had nonsustained VT was started on amiodarone.  Initiated norepinephrine at 12, currently on the same dose.  Her IABP is at 1:1.  Echocardiogram 2/2 (IABP in place) showed LVEF 60-65%, dilated IVC.  Has received IV Lasix, Brilinta and, statin Course has been complicated by some agitated delirium.  She had to have her IABP replaced evening 2/2.  She was hyperkalemic and received Lokelma 2/2 midday. ABG 2300: 7.33/30/78, potassium 6.4 > 5.9.  Sodium 132 > 129.  Other labs significant for transaminitis, likely shock liver.  Chest x-ray with basilar atelectasis.  Lactic acid 4.2 > 2.8  PCCM asked to consult regarding her metabolic acidosis, metabolic disarray  Pertinent  Medical History   Past Medical History:  Diagnosis Date   Asthma    DEGENERATIVE JOINT DISEASE, GENERALIZED 02/01/2007   Qualifier: Diagnosis of  By: Burnice Logan  MD, Doretha Sou    DIARRHEA, CHRONIC 08/20/2009   Qualifier: Diagnosis of  By: Burnice Logan  MD, Doretha Sou    DM 02/01/2007   Qualifier: Diagnosis of  By: Floyde Parkins     FATIGUE 10/20/2008   Qualifier: Diagnosis of  By: Burnice Logan  MD, Doretha Sou    HYPERCHOLESTEROLEMIA 02/01/2007   Qualifier: Diagnosis of  By: Floyde Parkins     HYPERLIPIDEMIA 12/20/2007   Qualifier: Diagnosis of  By: Burnice Logan  MD, Doretha Sou    HYPERTENSION 02/01/2007   Qualifier: Diagnosis of  By: Russella Dar BACK PAIN 01/13/2010   Qualifier: Diagnosis of  By: Burnice Logan  MD, Doretha Sou    MUSCLE CRAMPS 04/06/2010   Qualifier: Diagnosis of  By: Burnice Logan  MD, Doretha Sou     MYALGIA 01/20/2010   Qualifier: Diagnosis of  By: Elease Hashimoto MD, Bruce     OSTEOARTHRITIS 01/13/2010   Qualifier: Diagnosis of  By: Burnice Logan  MD, Doretha Sou    OVERACTIVE BLADDER 08/20/2009   Qualifier: Diagnosis of  By: Burnice Logan  MD, Doretha Sou    PLEURISY 06/18/2008   Qualifier: Diagnosis of  By: Niel Hummer MD, Lorinda Creed    PNEUMONIA 06/18/2008   Qualifier: Diagnosis of  By: Niel Hummer MD, Willie R    Rectal prolapse 09/20/2007   Qualifier: Diagnosis of  By: Burnice Logan  MD, Doretha Sou    VITAMIN B12 DEFICIENCY 07/20/2010   Qualifier: Diagnosis of  By: Carlean Purl MD, FACG, [facility] Events: Including procedures, antibiotic start and stop dates in addition to other pertinent events     Interim History / Subjective:     Objective   Blood pressure 98/70, pulse 82, temperature 97.7 F (36.5 C), temperature source Axillary, resp. rate (!) 34, height 5\' 2"  (1.575 m), weight 71.7 kg, SpO2 96 %. CVP:  [11 mmHg-15 mmHg] 15 mmHg      Intake/Output Summary (Last 24 hours) at 08/12/22 0054 Last data filed at 08-12-22 0000 Gross per 24 hour  Intake 1331.47 ml  Output 380 ml  Net 951.47 ml   Filed Weights   08/11/2022 1051  Weight: 71.7 kg  Examination: General: Obese elderly woman, somewhat agitated HENT: Oropharynx clear, pupils equal, no stridor Lungs: Lungs distant, clear bilaterally Cardiovascular: Mechanical sounds from IABP Abdomen: Obese, nondistended with positive bowel sounds Extremities: No significant edema Neuro: Somewhat sleepy, does call out, random upper extremity movement.  She does answer questions and follow commands GU: Deferred  Resolved Hospital Problem list     Assessment & Plan:  Multifactorial metabolic acidosis, mild elevated lactate as well as evolving renal failure. -Agree with BMP now -Follow lactic acid to ensure it does not rise -Support renal perfusion and follow BMP -No clear indication for bicarbonate at this time but  consider especially if increased work of breathing to deal with her acidosis  Hyperkalemia -Received Lokelma on 2/2, additional dose of Lasix and calcium earlier this evening -Await next BMP, will give Lokelma again if rising  STEMI with cardiogenic shock, IABP in place. -Management as per cardiology plans -Norepinephrine -Brilinta, statin, diuresis as able to tolerate  NSVT -On amiodarone  Dementia with agitated delirium -Minimize sedating medications as able but she has required low-dose Ativan to tolerate IABP.  Attempting to balance this with oversedation  Diabetes mellitus type 2 -On sliding scale insulin   Best Practice (right click and "Reselect all SmartList Selections" daily)   Diet/type: clear liquids DVT prophylaxis: systemic heparin GI prophylaxis: PPI Lines: Central line Foley:  Yes, and it is still needed Code Status:  full code Last date of multidisciplinary goals of care discussion [pending]  Labs   CBC: Recent Labs  Lab 08/29/2022 1024 08/28/2022 1050 08/19/2022 2002 08/30/2022 2305  WBC 11.3*  --  13.7*  --   NEUTROABS  --   --  11.2*  --   HGB 10.1* 9.9* 10.8* 12.9  HCT 30.2* 29.0* 33.8* 38.0  MCV 90.4  --  92.1  --   PLT 300  --  326  --     Basic Metabolic Panel: Recent Labs  Lab 08/30/2022 1024 08/04/2022 1050 08/21/2022 1458 08/28/2022 2002 08/11/2022 2305  NA 132* 133* 133* 132* 129*  K 6.2* 6.3* 6.1* 6.4* 5.9*  CL 103 107 105 102  --   CO2 15*  --  17* 20*  --   GLUCOSE 299* 299* 301* 266*  --   BUN 40* 35* 40* 41*  --   CREATININE 1.62* 1.60* 1.61* 2.05*  --   CALCIUM 9.5  --  9.7 9.7  --   MG  --   --  1.6* 2.7*  --   PHOS  --   --   --  5.0*  --    GFR: Estimated Creatinine Clearance: 16.6 mL/min (A) (by C-G formula based on SCr of 2.05 mg/dL (H)). Recent Labs  Lab 08/12/2022 1024 08/26/2022 1052 08/28/2022 1458 08/23/2022 1655 08/16/2022 2002  WBC 11.3*  --   --   --  13.7*  LATICACIDVEN  --  3.6* 4.2* 2.8*  --     Liver Function  Tests: Recent Labs  Lab 08/29/2022 1024 08/04/2022 2002  AST 28 479*  ALT 12 113*  ALKPHOS 33* 43  BILITOT 0.3 0.3  PROT 5.5* 6.1*  ALBUMIN 2.6* 2.8*   No results for input(s): "LIPASE", "AMYLASE" in the last 168 hours. No results for input(s): "AMMONIA" in the last 168 hours.  ABG    Component Value Date/Time   PHART 7.332 (L) 08/30/2022 2305   PCO2ART 29.7 (L) 08/28/2022 2305   PO2ART 78 (L) 08/11/2022 2305   HCO3 15.8 (L) 08/26/2022 2305  TCO2 17 (L) 08/19/2022 2305   ACIDBASEDEF 9.0 (H) 08/08/2022 2305   O2SAT 95 08/10/2022 2305     Coagulation Profile: Recent Labs  Lab 08/25/2022 1024  INR 1.2    Cardiac Enzymes: No results for input(s): "CKTOTAL", "CKMB", "CKMBINDEX", "TROPONINI" in the last 168 hours.  HbA1C: Hemoglobin A1C  Date/Time Value Ref Range Status  06/06/2022 01:27 PM 7.4 (A) 4.0 - 5.6 % Final  02/03/2022 11:21 AM 7.8 (A) 4.0 - 5.6 % Final  11/15/2018 12:00 AM 7.2  Final   Hgb A1c MFr Bld  Date/Time Value Ref Range Status  08/09/2022 10:40 AM 7.1 (H) 4.8 - 5.6 % Final    Comment:    (NOTE) Pre diabetes:          5.7%-6.4%  Diabetes:              >6.4%  Glycemic control for   <7.0% adults with diabetes   09/10/2021 07:52 AM 9.2 (H) 4.8 - 5.6 % Final    Comment:    (NOTE) Pre diabetes:          5.7%-6.4%  Diabetes:              >6.4%  Glycemic control for   <7.0% adults with diabetes     CBG: Recent Labs  Lab 08/19/2022 1308 08/25/2022 1635 08/21/2022 2113  GLUCAP 287* 283* 253*    Review of Systems:   As per HPi  Past Medical History:  She,  has a past medical history of Asthma, DEGENERATIVE JOINT DISEASE, GENERALIZED (02/01/2007), DIARRHEA, CHRONIC (08/20/2009), DM (02/01/2007), FATIGUE (10/20/2008), HYPERCHOLESTEROLEMIA (02/01/2007), HYPERLIPIDEMIA (12/20/2007), HYPERTENSION (02/01/2007), LOW BACK PAIN (01/13/2010), MUSCLE CRAMPS (04/06/2010), MYALGIA (01/20/2010), OSTEOARTHRITIS (01/13/2010), OVERACTIVE BLADDER (08/20/2009), PLEURISY  (06/18/2008), PNEUMONIA (06/18/2008), Rectal prolapse (09/20/2007), and VITAMIN B12 DEFICIENCY (07/20/2010).   Surgical History:   Past Surgical History:  Procedure Laterality Date   ABDOMINAL HYSTERECTOMY     partial- states she has her ovaries   APPENDECTOMY     BLADDER SURGERY     CATARACT EXTRACTION Bilateral 2012   CHOLECYSTECTOMY     CORONARY STENT INTERVENTION N/A 08/21/2022   Procedure: CORONARY STENT INTERVENTION;  Surgeon: Martinique, Peter M, MD;  Location: [place] [facility];  Service: Cardiovascular;  Laterality: N/A;   CORONARY/GRAFT ACUTE MI REVASCULARIZATION N/A 08/14/2022   Procedure: Coronary/Graft Acute MI Revascularization;  Surgeon: Martinique, Peter M, MD;  Location: [place] [facility];  Service: Cardiovascular;  Laterality: N/A;   IABP INSERTION N/A 08/14/2022   Procedure: IABP Insertion;  Surgeon: Martinique, Peter M, MD;  Location: [facility];  Service: Cardiovascular;  Laterality: N/A;   LEFT HEART CATH AND CORONARY ANGIOGRAPHY N/A 08/28/2022   Procedure: LEFT HEART CATH AND CORONARY ANGIOGRAPHY;  Surgeon: Martinique, Peter M, MD;  Location: [facility];  Service: Cardiovascular;  Laterality: N/A;     Social History:   reports that she has quit smoking. Her smoking use included cigarettes. She smoked an average of 10 packs per day. She has never used smokeless tobacco. She reports that she does not drink alcohol and does not use drugs.   Family History:  Her family history includes Asthma in her father; COPD in her father; Deep vein thrombosis in her mother; Heart attack in her brother and maternal grandfather; Prostate cancer in her brother. There is no history of Diabetes, Heart disease, or Stroke.   Allergies Allergies  Allergen Reactions   Metformin Diarrhea   Sitagliptin Phosphate Other (See Comments)  abdominal pain     Home Medications  Prior to Admission medications   Medication Sig Start Date End Date Taking? Authorizing Provider  Acetaminophen 650 MG  TABS Take 1 tablet (650 mg total) by mouth 3 (three) times daily as needed. 12/19/12   Moreno-Coll, Adlih, MD  aspirin 81 MG tablet Take 81 mg by mouth daily.    [provider]  Blood Glucose Calibration (ACCU-CHEK GUIDE CONTROL) LIQD Use as directed 05/07/20   Dorothyann Peng, NP  Blood Glucose Monitoring Suppl (ACCU-CHEK AVIVA PLUS) w/Device KIT Use to check blood sugar 3 times a day 08/15/19   Philemon Kingdom, MD  Blood Glucose Monitoring Suppl (ACCU-CHEK GUIDE) w/Device KIT Use as directed 05/07/20   Dorothyann Peng, NP  calcium-vitamin D (OSCAL WITH D 500-200) 500-200 MG-UNIT per tablet Take 1 tablet by mouth daily.    [provider]  clopidogrel (PLAVIX) 75 MG tablet Take 1 tablet (75 mg total) by mouth daily. 09/12/21   Virl Axe, MD  Continuous Blood Gluc Sensor (FREESTYLE LIBRE 14 DAY SENSOR) MISC Use to check blood glucose 3 times a day 09/30/20   Philemon Kingdom, MD  Dulaglutide (TRULICITY) 3 OI/7.8MV SOPN Inject 3 mg into the skin once a week. 06/11/21   Philemon Kingdom, MD  fluticasone (FLONASE) 50 MCG/ACT nasal spray Place 1 spray into the nose daily. 12/19/12   Moreno-Coll, Adlih, MD  Glucagon 3 MG/DOSE POWD Place 3 mg into the nose once as needed for up to 1 dose. 09/11/20   Philemon Kingdom, MD  glucose blood (ACCU-CHEK AVIVA PLUS) test strip Use to check blood sugar 3 times a day. 08/15/19   Philemon Kingdom, MD  glucose blood (ACCU-CHEK GUIDE) test strip CHECK BLOOD SUGAR THREE TIMES DAILY 09/02/20   Philemon Kingdom, MD  insulin NPH Human (NOVOLIN N) 100 UNIT/ML injection Inject 0.32 mLs (32 Units total) into the skin daily before breakfast. Pens please 09/21/21   Philemon Kingdom, MD  Insulin Pen Needle (BD PEN NEEDLE NANO U/F) 32G X 4 MM MISC USE TO INJECT INSULIN 4 TIMES DAILY AS DIRECTED. 08/15/19   Philemon Kingdom, MD  Lancets [facility] ULTRASOFT) lancets Use as instructed to test 3 times daily 08/15/19   Philemon Kingdom, MD  memantine (NAMENDA) 5 MG  tablet Take 1 tablet (5 mg total) by mouth daily. 12/22/21   Rondel Jumbo, PA-C  Multiple Vitamin (MULTIVITAMIN) tablet Take 1 tablet by mouth daily.    [provider]  NOVOLOG FLEXPEN 100 UNIT/ML FlexPen Inject under skin 5-8 units 3x a day before meals 09/21/21   Philemon Kingdom, MD  simvastatin (ZOCOR) 40 MG tablet TAKE 1 TABLET EVERY DAY Patient taking differently: Take 40 mg by mouth daily at 6 PM. 03/10/21   Dorothyann Peng, NP     Critical care time: 33 min      Baltazar Apo, MD, PhD 2022/08/30, 12:54 AM [facility] [phone] or if no answer before 7:00PM call [phone] For any issues after 7:00PM please call eLink [phone]

## 2022-09-02 NOTE — Progress Notes (Signed)
eLink Physician-Brief Progress Note Patient Name: [name] [name] DOB: [birth year] MRN: [id number]   Date of Service  08/09/22  HPI/Events of Note  [name] continues to decline.  She increasing vasopressor needs, poor UOP, evidence of shock liver, increasing resp insuff, and acute on chronic encephalopathy.  Her prognosis is poor.  I discussed with daughter Susette Racer her present status and I recommended a do not resuscitate status.  She is going to call her brother and discuss this.  I asked her to address this now as the patient's status is declining.  eICU Interventions  Continue present management     Intervention Category Major Interventions: End of life / care limitation discussion  Mauri Brooklyn, P 09-Aug-2022, 5:17 AM

## 2022-09-02 NOTE — Progress Notes (Signed)
ANTICOAGULATION CONSULT NOTE - Initial Consult  Pharmacy Consult for IV heparin  Indication: IABP  Allergies  Allergen Reactions   Metformin Diarrhea   Sitagliptin Phosphate Other (See Comments)    abdominal pain    Patient Measurements: Height: 5\' 2"  (157.5 cm) Weight: 71.7 kg (158 lb 1.1 oz) (from 12/23) IBW/kg (Calculated) : 50.1 Heparin Dosing Weight: ~ 65 kg  Vital Signs: Temp: 97.7 F (36.5 C) (02/02 2300) Temp Source: Axillary (02/02 2300) BP: 98/70 (02/02 2300) Pulse Rate: 82 (02/02 2300)  Labs: Recent Labs    08/10/2022 1024 08/16/2022 1050 08/21/2022 1458 08/26/2022 2002 08/17/2022 2305 08/04/2022 2316  HGB 10.1* 9.9*  --  10.8* 12.9  --   HCT 30.2* 29.0*  --  33.8* 38.0  --   PLT 300  --   --  326  --   --   APTT <20*  --   --   --   --   --   LABPROT 14.7  --   --   --   --   --   INR 1.2  --   --   --   --   --   HEPARINUNFRC  --   --   --   --   --  0.58  CREATININE 1.62* 1.60* 1.61* 2.05*  --   --   TROPONINIHS 192*  --   --   --   --   --      Estimated Creatinine Clearance: 16.6 mL/min (A) (by C-G formula based on SCr of 2.05 mg/dL (H)).   Medical History: Past Medical History:  Diagnosis Date   Asthma    DEGENERATIVE JOINT DISEASE, GENERALIZED 02/01/2007   Qualifier: Diagnosis of  By: Burnice Logan  MD, Doretha Sou    DIARRHEA, CHRONIC 08/20/2009   Qualifier: Diagnosis of  By: Burnice Logan  MD, Doretha Sou    DM 02/01/2007   Qualifier: Diagnosis of  By: Floyde Parkins     FATIGUE 10/20/2008   Qualifier: Diagnosis of  By: Burnice Logan  MD, Doretha Sou    HYPERCHOLESTEROLEMIA 02/01/2007   Qualifier: Diagnosis of  By: Mariam Dollar 12/20/2007   Qualifier: Diagnosis of  By: Burnice Logan  MD, Doretha Sou    HYPERTENSION 02/01/2007   Qualifier: Diagnosis of  By: Russella Dar BACK PAIN 01/13/2010   Qualifier: Diagnosis of  By: Burnice Logan  MD, Doretha Sou    MUSCLE CRAMPS 04/06/2010   Qualifier: Diagnosis of  By: Burnice Logan  MD, Doretha Sou    MYALGIA 01/20/2010    Qualifier: Diagnosis of  By: Elease Hashimoto MD, Bruce     OSTEOARTHRITIS 01/13/2010   Qualifier: Diagnosis of  By: Burnice Logan  MD, Doretha Sou    OVERACTIVE BLADDER 08/20/2009   Qualifier: Diagnosis of  By: Burnice Logan  MD, Doretha Sou    PLEURISY 06/18/2008   Qualifier: Diagnosis of  By: Niel Hummer MD, Lorinda Creed    PNEUMONIA 06/18/2008   Qualifier: Diagnosis of  By: Niel Hummer MD, Lorinda Creed    Rectal prolapse 09/20/2007   Qualifier: Diagnosis of  By: Burnice Logan  MD, Doretha Sou    VITAMIN B12 DEFICIENCY 07/20/2010   Qualifier: Diagnosis of  By: Carlean Purl MD, Tonna Boehringer E     Medications:  Infusions:   sodium chloride 10 mL/hr at 09/03/2022 0000   amiodarone 30 mg/hr (09-03-2022 0000)   heparin 700 Units/hr (09/03/22 0000)   norepinephrine (LEVOPHED) Adult infusion 12 mcg/min (03-Sep-2022 0000)  Assessment: 89 yo female s/p MI now on IABP. Pharmacy asked to begin IV heparin for anticoagulation.  No known history of bleeding, no anticoagulants PTA per my review.  Heparin level came back just slightly above goal at 0.58. We will decrease a little and check another level in AM.   Goal of Therapy:  Heparin level 0.2-0.5 while on IABP Monitor platelets by anticoagulation protocol: Yes   Plan:  Decrease heparin to 650 units/hr with no bolus. Check heparin level 8 hrs  Daily heparin level and CBC.  Onnie Boer, PharmD, BCIDP, AAHIVP, CPP Infectious Disease Pharmacist 08/12/22 1:09 AM

## 2022-09-02 NOTE — Progress Notes (Signed)
Comfort Care measures initiated.  Family is ready to stop mechanical support and medications at this time.  Given 2mg  morphine for comfort, IABP turned off and vasopressors weaned down.  Support given to family at bedside.

## 2022-09-02 NOTE — Death Summary Note (Signed)
  Advanced Heart Failure Death Summary  Death Summary   Patient ID: [name] [name] MRN: [id number], DOB/AGE: [birth year] 89 y.o. Admit date: 08/17/2022 D/C date:     08/25/2022   Primary Discharge Diagnoses:  Acute Anterior MI/ CAD s/p LAD PCI  Cardiogenic Shock Requiring MCS  Lactic Acidosis  Acute Anuric Renal Failure Due to ATN/Shock  Ventricular Tachycardia  Hyperkalemia  Dementia with agitated delirium   Hospital Course:  89 year old with a history of dementia, HTN, DMII, HLD, CKD Stage III, and stroke. No previous cardiac history.    Most recent Echo 2023 EF 55-60%, now WMA, and grade IDD.    Presented to ED on 08/12/2022 with chest pain. Admitted with STEMI. EKG with ST elevation lead I, AVL, and V2. Hypotensive on arrival. CODE STEMI called. Take urgently to the cath lab. Found to have acute occlusion proximal LAD , 90% L Cx, and LVEDP 30.  S/P PCI DES to LAD. Loaded on Brillinta. IABP placed to stabilize. Had NSVT and started on amio drip and started on Norepi 12 mcg. Lactic acid 3.5. K 6.1.    Echo EF 60-65% RV normal. Moderate pericardial effusion.   She developed progressive shock with worsening respiratory status, anuria, worsening acidosis and increased pressor requirements.  CCM met with patient and family and decision was made to transition to comfort care. Comfort Care measures initiated. Mechanical support, inotrope's and pressors were discontinued. Morphine given for comfort. Patient passed 2022-08-25   Significant Diagnostic Studies  [facility] 08/23/2022   Ost LAD to Prox LAD lesion is 100% stenosed.   Mid Cx lesion is 90% stenosed.   A drug-eluting stent was successfully placed using a SYNERGY [id number] 3.0X38.   Post intervention, there is a 0% residual stenosis.   LV end diastolic pressure is moderately elevated.   Acute occlusion of the proximal LAD which is the culprit lesion. 90% mid LCx Unable to engage the RCA Moderately elevated LVEDP 30 mm Hg Cardiogenic shock. IABP  placed, on IV Levophed Successful PCI of the proximal LAD with DES.    Echo 08/24/2022  EF 60-65%, RV normal   Consultations  Cardiology  Advanced Heart Failure Pulmonary Critical Care Medicine    Duration of Discharge Encounter: Greater than 35 minutes   Signed, Lyda Jester, PA-C 08/17/2022, 2:09 PM

## 2022-09-02 NOTE — Progress Notes (Signed)
eLink Physician-Brief Progress Note Patient Name: [name] [name] DOB: April 27, 1929 MRN: [id number]   Date of Service  Aug 18, 2022  HPI/Events of Note  Family has had chance to visit their mother and they wish for comfort care with stopping of vasopressors and balloon pump.  I agree with this plan.  eICU Interventions  DNR comfort measures only     Intervention Category Major Interventions: End of life / care limitation discussion  Mauri Brooklyn, Mamie Nick 2022/08/18, 6:53 AM

## 2022-09-02 NOTE — Progress Notes (Signed)
eLink Physician-Brief Progress Note Patient Name: MALONI MUSLEH DOB: 01-22-1929 MRN: [id number]   Date of Service  Sep 05, 2022  HPI/Events of Note  89 yr old with dementia, DM, and HTN presented with STEMI.  Now s/p PCI with balloon pump in place.  She has encephalopathy and acute renal failure with hyperkalemia.  Ongoing management of cardiogenic shock and metabolic derangements.   eICU Interventions  Chart reviewed     Intervention Category Intermediate Interventions: Electrolyte abnormality - evaluation and management Evaluation Type: New Patient Evaluation  Mauri Brooklyn, P 09-05-22, 2:40 AM

## 2022-09-02 NOTE — Progress Notes (Signed)
eLink Physician-Brief Progress Note Patient Name: [name] [name] DOB: 1928-09-26 MRN: [id number]   Date of Service  08/28/22  HPI/Events of Note  Potassium 6.5.  poor UOP.  Encephalopathy.  eICU Interventions  Insulin and D50 Calcium Low rate bicarb drip     Intervention Category Intermediate Interventions: Electrolyte abnormality - evaluation and management  Mauri Brooklyn, P 2022/08/28, 2:38 AM

## 2022-09-02 NOTE — Progress Notes (Signed)
Advanced Heart Failure Team Rounding Note   Primary Physician: Dorothyann Peng, NP PCP-Cardiologist:  None  Reason for Consultation: Cardiogenic Shock   HPI:    Remains on IABP and NE. Developed progressive shock overnight with worsening respiratory status and anuria. Lactic acid climbing. Pressor requirements going up. I add VP early this am.   CCM met with patient and family this am and now transitioning to comfort care  Patient sedated. Still with labored breathing  Past Medical History: Past Medical History:  Diagnosis Date   Asthma    DEGENERATIVE JOINT DISEASE, GENERALIZED 02/01/2007   Qualifier: Diagnosis of  By: Burnice Logan  MD, Doretha Sou    DIARRHEA, CHRONIC 08/20/2009   Qualifier: Diagnosis of  By: Burnice Logan  MD, Doretha Sou    DM 02/01/2007   Qualifier: Diagnosis of  By: Floyde Parkins     FATIGUE 10/20/2008   Qualifier: Diagnosis of  By: Burnice Logan  MD, Doretha Sou    HYPERCHOLESTEROLEMIA 02/01/2007   Qualifier: Diagnosis of  By: Floyde Parkins     HYPERLIPIDEMIA 12/20/2007   Qualifier: Diagnosis of  By: Burnice Logan  MD, Doretha Sou    HYPERTENSION 02/01/2007   Qualifier: Diagnosis of  By: Russella Dar BACK PAIN 01/13/2010   Qualifier: Diagnosis of  By: Burnice Logan  MD, Doretha Sou    MUSCLE CRAMPS 04/06/2010   Qualifier: Diagnosis of  By: Burnice Logan  MD, Doretha Sou    MYALGIA 01/20/2010   Qualifier: Diagnosis of  By: Elease Hashimoto MD, Bruce     OSTEOARTHRITIS 01/13/2010   Qualifier: Diagnosis of  By: Burnice Logan  MD, Doretha Sou    OVERACTIVE BLADDER 08/20/2009   Qualifier: Diagnosis of  By: Burnice Logan  MD, Doretha Sou    PLEURISY 06/18/2008   Qualifier: Diagnosis of  By: Niel Hummer MD, Lorinda Creed    PNEUMONIA 06/18/2008   Qualifier: Diagnosis of  By: Niel Hummer MD, Lorinda Creed    Rectal prolapse 09/20/2007   Qualifier: Diagnosis of  By: Burnice Logan  MD, Doretha Sou    VITAMIN B12 DEFICIENCY 07/20/2010   Qualifier: Diagnosis of  By: Carlean Purl MD, Dimas Millin     Past Surgical History: Past  Surgical History:  Procedure Laterality Date   ABDOMINAL HYSTERECTOMY     partial- states she has her ovaries   APPENDECTOMY     BLADDER SURGERY     CATARACT EXTRACTION Bilateral 2012   CHOLECYSTECTOMY     CORONARY STENT INTERVENTION N/A 08/25/2022   Procedure: CORONARY STENT INTERVENTION;  Surgeon: Martinique, Peter M, MD;  Location: [facility];  Service: Cardiovascular;  Laterality: N/A;   CORONARY/GRAFT ACUTE MI REVASCULARIZATION N/A 08/24/2022   Procedure: Coronary/Graft Acute MI Revascularization;  Surgeon: Martinique, Peter M, MD;  Location: [facility];  Service: Cardiovascular;  Laterality: N/A;   IABP INSERTION N/A 08/17/2022   Procedure: IABP Insertion;  Surgeon: Martinique, Peter M, MD;  Location: [facility];  Service: Cardiovascular;  Laterality: N/A;   LEFT HEART CATH AND CORONARY ANGIOGRAPHY N/A 08/09/2022   Procedure: LEFT HEART CATH AND CORONARY ANGIOGRAPHY;  Surgeon: Martinique, Peter M, MD;  Location: [facility];  Service: Cardiovascular;  Laterality: N/A;    Family History: Family History  Problem Relation Age of Onset   Deep vein thrombosis Mother    COPD Father    Asthma Father    Prostate cancer Brother    Heart attack Brother    Heart attack Maternal  Grandfather    Diabetes Neg Hx    Heart disease Neg Hx    Stroke Neg Hx     Social History: Social History   Socioeconomic History   Marital status: Single    Spouse name: Not on file   Number of children: Not on file   Years of education: Not on file   Highest education level: Not on file  Occupational History   Not on file  Tobacco Use   Smoking status: Former    Packs/day: 10.00    Types: Cigarettes   Smokeless tobacco: Never   Tobacco comments:    quit > 26 yo   Vaping Use   Vaping Use: Never used  Substance and Sexual Activity   Alcohol use: No   Drug use: No   Sexual activity: Not on file  Other Topics Concern   Not on file  Social History Narrative   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     Married for 87 years, widowed for 22 years    Right handed    Lives alone   Social Determinants of Health   Financial Resource Strain: Low Risk  (08/09/2021)   Overall Financial Resource Strain (CARDIA)    Difficulty of Paying Living Expenses: Not hard at all  Food Insecurity: No Food Insecurity (08/09/2021)   Hunger Vital Sign    Worried About Running Out of Food in the Last Year: Never true    [place] in the Last Year: Never true  Transportation Needs: No Transportation Needs (08/09/2021)   PRAPARE - Hydrologist (Medical): No    Lack of Transportation (Non-Medical): No  Physical Activity: Inactive (08/09/2021)   Exercise Vital Sign    Days of Exercise per Week: 0 days    Minutes of Exercise per Session: 0 min  Stress: No Stress Concern Present (08/09/2021)   [place]    Feeling of Stress : Not at all  Social Connections: Moderately Integrated (08/09/2021)   Social Connection and Isolation Panel [NHANES]    Frequency of Communication with Friends and Family: More than three times a week    Frequency of Social Gatherings with Friends and Family: More than three times a week    Attends Religious Services: 1 to 4 times per year    Active Member of [employer] or Organizations: Yes    Attends Archivist Meetings: 1 to 4 times per year    Marital Status: Widowed    Allergies:  Allergies  Allergen Reactions   Metformin Diarrhea   Sitagliptin Phosphate Other (See Comments)    abdominal pain    Objective:    Vital Signs:   Temp:  [96.4 F (35.8 C)-98.2 F (36.8 C)] 98.2 F (36.8 C) 09-03-22 0300) Pulse Rate:  [0-203] 181 2022/09/03 0745) Resp:  [5-48] 34 September 03, 2022 0745) BP: (50-129)/(24-94) 108/58 03-Sep-2022 0700) SpO2:  [56 %-99 %] 96 % 09/03/2022 0745) Arterial Line BP: (91-121)/(25-46) 111/33 09/03/22 0745) Weight:  [71.7 kg] 71.7 kg (02/02 1051) Last BM Date : 08/04/22  Weight  change: Filed Weights   08/08/2022 1051  Weight: 71.7 kg    Intake/Output:   Intake/Output Summary (Last 24 hours) at 03-Sep-2022 0949 Last data filed at 09/03/22 0900 Gross per 24 hour  Intake 2035.23 ml  Output 415 ml  Net 1620.23 ml       Physical Exam    General:  Elderly sedated. Labored breathing HEENT: normal  Neck: supple. LIJ TLC CVP up  Cor: PMI nondisplaced. Regular rate & rhythm. No rubs, gallops or murmurs. Lungs: rhonchi Abdomen: soft, nontender, nondistended. No hepatosplenomegaly. No bruits or masses. Good bowel sounds. Extremities: no cyanosis, clubbing, rash, edema LFA IABP Neuro: sedated will rouse to noxious stimuli     Telemetry   Sinus  Labs   Basic Metabolic Panel: Recent Labs  Lab 08/19/2022 1024 08/27/2022 1050 08/17/2022 1458 08/21/2022 2002 08/07/2022 2305 09/01/2022 0053 2022-09-01 0459 Sep 01, 2022 0505  NA 132* 133* 133* 132* 129* 131* 130* 130*  K 6.2* 6.3* 6.1* 6.4* 5.9* 6.5* 6.2* 5.8*  CL 103 107 105 102  --  101  --  99  CO2 15*  --  17* 20*  --  17*  --  16*  GLUCOSE 299* 299* 301* 266*  --  285*  --  305*  BUN 40* 35* 40* 41*  --  44*  --  45*  CREATININE 1.62* 1.60* 1.61* 2.05*  --  2.23*  --  2.59*  CALCIUM 9.5  --  9.7 9.7  --  9.5  --  9.8  MG  --   --  1.6* 2.7*  --  2.3  --  2.3  PHOS  --   --   --  5.0*  --  5.1*  --  5.3*     Liver Function Tests: Recent Labs  Lab 08/04/2022 1024 08/09/2022 2002 2022/09/01 0053 Sep 01, 2022 0505  AST 28 479* 455* 414*  ALT 12 113* 117* 118*  ALKPHOS 33* 43 40 41  BILITOT 0.3 0.3 0.1* 0.2*  PROT 5.5* 6.1* 5.7* 5.6*  ALBUMIN 2.6* 2.8* 2.6* 2.4*    No results for input(s): "LIPASE", "AMYLASE" in the last 168 hours. No results for input(s): "AMMONIA" in the last 168 hours.  CBC: Recent Labs  Lab 08/10/2022 1024 08/08/2022 1050 08/26/2022 2002 08/04/2022 2305 2022/09/01 0459 Sep 01, 2022 0505  WBC 11.3*  --  13.7*  --   --  17.2*  NEUTROABS  --   --  11.2*  --   --  12.8*  HGB 10.1* 9.9* 10.8* 12.9 10.2*  9.7*  HCT 30.2* 29.0* 33.8* 38.0 30.0* 30.1*  MCV 90.4  --  92.1  --   --  93.2  PLT 300  --  326  --   --  306     Cardiac Enzymes: No results for input(s): "CKTOTAL", "CKMB", "CKMBINDEX", "TROPONINI" in the last 168 hours.  BNP: BNP (last 3 results) No results for input(s): "BNP" in the last 8760 hours.  ProBNP (last 3 results) No results for input(s): "PROBNP" in the last 8760 hours.   CBG: Recent Labs  Lab 08/23/2022 1308 08/14/2022 1635 08/31/2022 2113 01-Sep-2022 0242 2022-09-01 0636  GLUCAP 287* 283* 253* 258* 235*     Coagulation Studies: Recent Labs    08/13/2022 1024  LABPROT 14.7  INR 1.2      Imaging   DG CHEST PORT 1 VIEW  Result Date: 08/14/2022 CLINICAL DATA:  Device fitting or adjustment EXAM: PORTABLE CHEST 1 VIEW COMPARISON:  08/22/2022 at 4:36 p.m. FINDINGS: Left central venous catheter has retracted with tip now localized in the mid to low SVC region. Shallow inspiration with probable atelectasis in the lung bases. Mild cardiac enlargement. No pneumothorax visualized. No pleural effusions. Surgical clips in the upper abdomen. IMPRESSION: Left central venous catheter has retracted with tip nonlocalized in the mid to low SVC region. Electronically Signed   By: Oren Beckmann.D.  On: 08/09/2022 19:59   DG Abd 1 View  Result Date: 08/17/2022 CLINICAL DATA:  Intra-aortic balloon pump placement EXAM: ABDOMEN - 1 VIEW COMPARISON:  None Available. FINDINGS: Supine frontal views of the abdomen and pelvis are obtained. Bilateral femoral catheters are seen, tips overlying the regions of the external iliac veins. There are 2 radiopaque markers identified overlying the expected location of the aorta. A radiopaque marker overlying the descending thoracic aorta at approximately the T7 level is noted, partially obscured by motion artifact. A second radiopaque marker overlies the lower central abdomen at the L4 level, in the region of the infrarenal abdominal aorta. Bowel gas  pattern is unremarkable. Excreted contrast within the bladder. No acute bony abnormalities. IMPRESSION: 1. Two radiopaque markers as above, superior at the T7 level and inferior at the L4 level overlying the expected course of the aorta. This is consistent with given history of intra-aortic balloon pump placement. 2. Unremarkable bowel gas pattern. Electronically Signed   By: Randa Ngo M.D.   On: 08/13/2022 18:38   DG CHEST PORT 1 VIEW  Result Date: 08/15/2022 CLINICAL DATA:  Central line EXAM: PORTABLE CHEST 1 VIEW COMPARISON:  Chest x-ray 09/09/2021 FINDINGS: Left-sided central venous catheter tip projects over the SVC. The heart is mildly enlarged, unchanged. Lung volumes are low. There is no focal lung infiltrate, pleural effusion or pneumothorax. No acute fractures are seen. IMPRESSION: Left-sided central venous catheter tip projects over the SVC. No pneumothorax. Electronically Signed   By: Ronney Asters M.D.   On: 08/11/2022 16:44   ECHOCARDIOGRAM COMPLETE  Result Date: 08/22/2022    ECHOCARDIOGRAM REPORT   Patient Name:   [name] [name] Date of Exam: 08/17/2022 Medical Rec #:  [id number]          Height:       62.0 in Accession #:    [id number]         Weight:       158.1 lb Date of Birth:  [birth year]         BSA:          1.730 m Patient Age:    9 years           BP:           87/55 mmHg Patient Gender: F                  HR:           63 bpm. Exam Location:  Inpatient Procedure: 2D Echo, Color Doppler and Cardiac Doppler Indications:    STEMI  History:        Patient has prior history of Echocardiogram examinations, most                 recent 09/10/2021. CAD; Risk Factors:Hypertension, Diabetes and                 Dyslipidemia.  Sonographer:    Raquel Sarna Senior RDCS Referring Phys: [id number] PETER M Martinique  Sonographer Comments: On IABP at time of study IMPRESSIONS  1. Left ventricular ejection fraction, by estimation, is 60 to 65%. The left ventricle has normal function. The left ventricle has no  regional wall motion abnormalities. There is mild concentric left ventricular hypertrophy.  2. Right ventricular systolic function is normal. The right ventricular size is normal.  3. Moderate pericardial effusion. The pericardial effusion is localized near the right ventricle. There is no evidence of cardiac tamponade.  4. No evidence of mitral valve  regurgitation.  5. There is moderate calcification of the aortic valve. Aortic valve regurgitation is trivial.  6. There is mild dilatation of the ascending aorta, measuring 39 mm.  7. The inferior vena cava is dilated in size with >50% respiratory variability, suggesting right atrial pressure of 8 mmHg. Comparison(s): No significant change from prior study. FINDINGS  Left Ventricle: Left ventricular ejection fraction, by estimation, is 60 to 65%. The left ventricle has normal function. The left ventricle has no regional wall motion abnormalities. The left ventricular internal cavity size was normal in size. There is  mild concentric left ventricular hypertrophy. Right Ventricle: The right ventricular size is normal. Right ventricular systolic function is normal. Left Atrium: Left atrial size was normal in size. Right Atrium: Right atrial size was normal in size. Pericardium: A moderately sized pericardial effusion is present. The pericardial effusion is localized near the right ventricle. There is no evidence of cardiac tamponade. Mitral Valve: Mild to moderate mitral annular calcification. No evidence of mitral valve regurgitation. Tricuspid Valve: Tricuspid valve regurgitation is not demonstrated. Aortic Valve: There is moderate calcification of the aortic valve. Aortic valve regurgitation is trivial. Pulmonic Valve: Pulmonic valve regurgitation is not visualized. Aorta: There is mild dilatation of the ascending aorta, measuring 39 mm. Venous: The inferior vena cava is dilated in size with greater than 50% respiratory variability, suggesting right atrial pressure of  8 mmHg. IAS/Shunts: No atrial level shunt detected by color flow Doppler.  LEFT VENTRICLE PLAX 2D LVIDd:         3.80 cm LVIDs:         2.30 cm LV PW:         1.00 cm LV IVS:        1.20 cm LVOT diam:     2.00 cm LV SV:         77 LV SV Index:   45 LVOT Area:     3.14 cm  LV Volumes (MOD) LV vol d, MOD A2C: 42.2 ml LV vol d, MOD A4C: 56.1 ml LV vol s, MOD A2C: 17.2 ml LV vol s, MOD A4C: 17.4 ml LV SV MOD A2C:     25.0 ml LV SV MOD A4C:     56.1 ml LV SV MOD BP:      31.2 ml RIGHT VENTRICLE RV S prime:     12.80 cm/s TAPSE (M-mode): 1.9 cm LEFT ATRIUM             Index        RIGHT ATRIUM           Index LA diam:        3.80 cm 2.20 cm/m   RA Area:     11.70 cm LA Vol (A2C):   28.6 ml 16.53 ml/m  RA Volume:   21.70 ml  12.54 ml/m LA Vol (A4C):   52.7 ml 30.47 ml/m LA Biplane Vol: 40.0 ml 23.12 ml/m  AORTIC VALVE LVOT Vmax:   153.00 cm/s LVOT Vmean:  108.000 cm/s LVOT VTI:    0.246 m  AORTA Ao Root diam: 2.80 cm Ao Asc diam:  3.90 cm  SHUNTS Systemic VTI:  0.25 m Systemic Diam: 2.00 cm Phineas Inches Electronically signed by Phineas Inches Signature Date/Time: 08/10/2022/3:10:30 PM    Final    CARDIAC CATHETERIZATION  Result Date: 08/27/2022   Ost LAD to Prox LAD lesion is 100% stenosed.   Mid Cx lesion is 90% stenosed.   A drug-eluting stent was successfully placed  using a SYNERGY XD [id number].   Post intervention, there is a 0% residual stenosis.   LV end diastolic pressure is moderately elevated. Acute occlusion of the proximal LAD which is the culprit lesion. 90% mid LCx Unable to engage the RCA Moderately elevated LVEDP 30 mm Hg Cardiogenic shock. IABP placed, on IV Levophed Successful PCI of the proximal LAD with DES. Plan: will continue IV heparin per pharmacy since IABP placed. Plan to load with oral Brinta. Once done can discontinue IV Kangrelor. Needs DAPT for one year. If patient recovers from acute infarct would consider bringing her back for staged PCI of the LCx and attempt to better image the RCA.      Medications:     Current Medications:  aspirin EC  81 mg Oral Daily   atorvastatin  40 mg Oral Daily   Chlorhexidine Gluconate Cloth  6 each Topical Daily   furosemide  40 mg Intravenous BID   insulin aspart  0-15 Units Subcutaneous TID WC   morphine  5 mg Intravenous Once   pantoprazole  40 mg Oral Daily   sodium chloride flush  3 mL Intravenous Q12H   ticagrelor  90 mg Oral BID   traZODone  50 mg Oral QHS    Infusions:  sodium chloride 10 mL/hr at 2022/08/10 0900   sodium chloride     amiodarone Stopped (08-10-2022 0806)   dexmedetomidine (PRECEDEX) IV infusion 0.6 mcg/kg/hr (2022/08/10 0900)   heparin Stopped (08-10-22 0806)   morphine 5 mg/hr (2022/08/10 0902)   norepinephrine (LEVOPHED) Adult infusion Stopped (August 10, 2022 1937)   vasopressin Stopped (2022/08/10 9024)      Patient Profile   [name] [name] is a 89 year old with a history of dementia, HTN, DMII, HLD, CKD Stage III, and stroke. No previous cardiac history.    Assessment/Plan    1. STEMI, Anterior -->complicated by cardiogenic shock Hypotensive on arrival with ST elevation anterior leads. Take urgently to the cath lab acute occlusion proximal LAD and 90% LCx. S/P PCI to LAD. LVEDP 30.  - Worse overnight with progressive shock and renal failure despite increasing support - D/w patient and family and now comfort care - Will stop IABP and pressors - Add morphine for comfort  2. ARF due to ATN/shock  3. VT - off amio due to hypotension   4. Dementia   5. DMII  6. Hyperkalemia   Plan as above. Transition to comfort care.   CRITICAL CARE Performed by: Glori Bickers  Total critical care time: 45 minutes  Critical care time was exclusive of separately billable procedures and treating other patients.  Critical care was necessary to treat or prevent imminent or life-threatening deterioration.  Critical care was time spent personally by me (independent of midlevel providers or residents) on the  following activities: development of treatment plan with patient and/or surrogate as well as nursing, discussions with consultants, evaluation of patient's response to treatment, examination of patient, obtaining history from patient or surrogate, ordering and performing treatments and interventions, ordering and review of laboratory studies, ordering and review of radiographic studies, pulse oximetry and re-evaluation of patient's condition.    Length of Stay: 1  Glori Bickers, MD  Aug 10, 2022, 9:49 AM  Advanced Heart Failure Team Pager [phone] (M-F; 7a - 5p)  Please contact [facility] for night-coverage after hours (4p -7a ) and weekends on [email or web address]   Glori Bickers, MD  9:49 AM

## 2022-09-02 NOTE — Progress Notes (Signed)
25ml of IV morphine wasted in stericycle.  Witnessed by Renard Matter RN

## 2022-09-02 DEATH — deceased

## 2022-10-06 ENCOUNTER — Ambulatory Visit: Payer: Medicare (Managed Care) | Admitting: Internal Medicine

## 2022-12-23 ENCOUNTER — Ambulatory Visit: Payer: Medicare (Managed Care) | Admitting: Physician Assistant
